# Patient Record
Sex: Female | Born: 1940 | Race: Black or African American | Hispanic: No | Smoking: Never smoker
Health system: Southern US, Community
[De-identification: ages and names within clinical notes are randomized; demographics above are authoritative.]

## PROBLEM LIST (undated history)

## (undated) DIAGNOSIS — K219 Gastro-esophageal reflux disease without esophagitis: Secondary | ICD-10-CM

## (undated) DIAGNOSIS — G20 Parkinson's disease: Secondary | ICD-10-CM

## (undated) DIAGNOSIS — N189 Chronic kidney disease, unspecified: Secondary | ICD-10-CM

## (undated) DIAGNOSIS — M419 Scoliosis, unspecified: Secondary | ICD-10-CM

## (undated) DIAGNOSIS — F0390 Unspecified dementia without behavioral disturbance: Secondary | ICD-10-CM

## (undated) DIAGNOSIS — G20A1 Parkinson's disease without dyskinesia, without mention of fluctuations: Secondary | ICD-10-CM

## (undated) DIAGNOSIS — N390 Urinary tract infection, site not specified: Secondary | ICD-10-CM

## (undated) DIAGNOSIS — I5030 Unspecified diastolic (congestive) heart failure: Secondary | ICD-10-CM

## (undated) DIAGNOSIS — I10 Essential (primary) hypertension: Secondary | ICD-10-CM

## (undated) HISTORY — DX: Chronic kidney disease, unspecified: N18.9

## (undated) HISTORY — DX: Unspecified diastolic (congestive) heart failure: I50.30

## (undated) HISTORY — PX: OTHER SURGICAL HISTORY: SHX169

## (undated) HISTORY — PX: EYE SURGERY: SHX253

---

## 2000-07-07 ENCOUNTER — Ambulatory Visit (HOSPITAL_COMMUNITY): Admission: RE | Admit: 2000-07-07 | Discharge: 2000-07-07 | Payer: Self-pay | Admitting: Family Medicine

## 2000-07-07 ENCOUNTER — Encounter: Payer: Self-pay | Admitting: Family Medicine

## 2000-07-13 ENCOUNTER — Encounter: Payer: Self-pay | Admitting: Family Medicine

## 2000-07-13 ENCOUNTER — Ambulatory Visit (HOSPITAL_COMMUNITY): Admission: RE | Admit: 2000-07-13 | Discharge: 2000-07-13 | Payer: Self-pay | Admitting: Family Medicine

## 2000-09-21 ENCOUNTER — Other Ambulatory Visit: Admission: RE | Admit: 2000-09-21 | Discharge: 2000-09-21 | Payer: Self-pay | Admitting: Obstetrics and Gynecology

## 2000-10-04 ENCOUNTER — Ambulatory Visit (HOSPITAL_COMMUNITY): Admission: RE | Admit: 2000-10-04 | Discharge: 2000-10-04 | Payer: Self-pay | Admitting: Obstetrics and Gynecology

## 2001-01-10 ENCOUNTER — Other Ambulatory Visit: Admission: RE | Admit: 2001-01-10 | Discharge: 2001-01-10 | Payer: Self-pay | Admitting: Family Medicine

## 2001-07-05 ENCOUNTER — Emergency Department (HOSPITAL_COMMUNITY): Admission: EM | Admit: 2001-07-05 | Discharge: 2001-07-05 | Payer: Self-pay | Admitting: Emergency Medicine

## 2001-07-22 ENCOUNTER — Ambulatory Visit (HOSPITAL_COMMUNITY): Admission: RE | Admit: 2001-07-22 | Discharge: 2001-07-22 | Payer: Self-pay | Admitting: Family Medicine

## 2001-07-22 ENCOUNTER — Encounter: Payer: Self-pay | Admitting: Family Medicine

## 2002-07-28 ENCOUNTER — Encounter (INDEPENDENT_AMBULATORY_CARE_PROVIDER_SITE_OTHER): Payer: Self-pay | Admitting: *Deleted

## 2002-07-28 ENCOUNTER — Ambulatory Visit (HOSPITAL_BASED_OUTPATIENT_CLINIC_OR_DEPARTMENT_OTHER): Admission: RE | Admit: 2002-07-28 | Discharge: 2002-07-28 | Payer: Self-pay | Admitting: Ophthalmology

## 2002-08-11 ENCOUNTER — Encounter: Payer: Self-pay | Admitting: Family Medicine

## 2002-08-11 ENCOUNTER — Ambulatory Visit (HOSPITAL_COMMUNITY): Admission: RE | Admit: 2002-08-11 | Discharge: 2002-08-11 | Payer: Self-pay | Admitting: Family Medicine

## 2003-07-30 ENCOUNTER — Ambulatory Visit (HOSPITAL_COMMUNITY): Admission: RE | Admit: 2003-07-30 | Discharge: 2003-07-30 | Payer: Self-pay | Admitting: Family Medicine

## 2003-12-21 ENCOUNTER — Emergency Department (HOSPITAL_COMMUNITY): Admission: EM | Admit: 2003-12-21 | Discharge: 2003-12-21 | Payer: Self-pay | Admitting: Emergency Medicine

## 2003-12-21 ENCOUNTER — Emergency Department (HOSPITAL_COMMUNITY): Admission: EM | Admit: 2003-12-21 | Discharge: 2003-12-21 | Payer: Self-pay | Admitting: *Deleted

## 2004-07-03 ENCOUNTER — Encounter (HOSPITAL_COMMUNITY): Admission: RE | Admit: 2004-07-03 | Discharge: 2004-07-04 | Payer: Self-pay | Admitting: Internal Medicine

## 2004-08-05 ENCOUNTER — Ambulatory Visit (HOSPITAL_COMMUNITY): Admission: RE | Admit: 2004-08-05 | Discharge: 2004-08-05 | Payer: Self-pay | Admitting: Family Medicine

## 2004-10-08 ENCOUNTER — Emergency Department (HOSPITAL_COMMUNITY): Admission: EM | Admit: 2004-10-08 | Discharge: 2004-10-08 | Payer: Self-pay | Admitting: Emergency Medicine

## 2005-01-06 ENCOUNTER — Emergency Department (HOSPITAL_COMMUNITY): Admission: EM | Admit: 2005-01-06 | Discharge: 2005-01-06 | Payer: Self-pay | Admitting: Emergency Medicine

## 2005-06-11 ENCOUNTER — Encounter (INDEPENDENT_AMBULATORY_CARE_PROVIDER_SITE_OTHER): Payer: Self-pay | Admitting: *Deleted

## 2005-06-11 ENCOUNTER — Ambulatory Visit (HOSPITAL_COMMUNITY): Admission: RE | Admit: 2005-06-11 | Discharge: 2005-06-11 | Payer: Self-pay | Admitting: General Surgery

## 2005-07-27 ENCOUNTER — Encounter (INDEPENDENT_AMBULATORY_CARE_PROVIDER_SITE_OTHER): Payer: Self-pay | Admitting: Specialist

## 2005-07-27 ENCOUNTER — Ambulatory Visit (HOSPITAL_BASED_OUTPATIENT_CLINIC_OR_DEPARTMENT_OTHER): Admission: RE | Admit: 2005-07-27 | Discharge: 2005-07-27 | Payer: Self-pay | Admitting: Ophthalmology

## 2005-08-14 ENCOUNTER — Ambulatory Visit (HOSPITAL_COMMUNITY): Admission: RE | Admit: 2005-08-14 | Discharge: 2005-08-14 | Payer: Self-pay | Admitting: Family Medicine

## 2005-11-13 ENCOUNTER — Emergency Department (HOSPITAL_COMMUNITY): Admission: EM | Admit: 2005-11-13 | Discharge: 2005-11-14 | Payer: Self-pay | Admitting: Emergency Medicine

## 2006-08-17 ENCOUNTER — Ambulatory Visit (HOSPITAL_COMMUNITY): Admission: RE | Admit: 2006-08-17 | Discharge: 2006-08-17 | Payer: Self-pay | Admitting: Family Medicine

## 2007-02-15 ENCOUNTER — Ambulatory Visit (HOSPITAL_COMMUNITY): Admission: RE | Admit: 2007-02-15 | Discharge: 2007-02-15 | Payer: Self-pay | Admitting: Family Medicine

## 2007-03-22 ENCOUNTER — Ambulatory Visit (HOSPITAL_COMMUNITY): Admission: RE | Admit: 2007-03-22 | Discharge: 2007-03-22 | Payer: Self-pay | Admitting: Family Medicine

## 2007-04-08 ENCOUNTER — Ambulatory Visit (HOSPITAL_COMMUNITY): Admission: RE | Admit: 2007-04-08 | Discharge: 2007-04-08 | Payer: Self-pay | Admitting: Family Medicine

## 2007-04-08 ENCOUNTER — Encounter: Payer: Self-pay | Admitting: Orthopedic Surgery

## 2007-04-27 ENCOUNTER — Ambulatory Visit: Payer: Self-pay | Admitting: Orthopedic Surgery

## 2007-04-27 DIAGNOSIS — M7150 Other bursitis, not elsewhere classified, unspecified site: Secondary | ICD-10-CM

## 2007-04-27 DIAGNOSIS — M19049 Primary osteoarthritis, unspecified hand: Secondary | ICD-10-CM | POA: Insufficient documentation

## 2007-06-29 ENCOUNTER — Ambulatory Visit: Payer: Self-pay | Admitting: Orthopedic Surgery

## 2007-08-23 ENCOUNTER — Ambulatory Visit (HOSPITAL_COMMUNITY): Admission: RE | Admit: 2007-08-23 | Discharge: 2007-08-23 | Payer: Self-pay | Admitting: Family Medicine

## 2008-05-01 ENCOUNTER — Ambulatory Visit (HOSPITAL_COMMUNITY): Admission: RE | Admit: 2008-05-01 | Discharge: 2008-05-01 | Payer: Self-pay | Admitting: Family Medicine

## 2008-05-02 ENCOUNTER — Ambulatory Visit: Payer: Self-pay | Admitting: Orthopedic Surgery

## 2008-05-14 ENCOUNTER — Ambulatory Visit (HOSPITAL_COMMUNITY): Admission: RE | Admit: 2008-05-14 | Discharge: 2008-05-14 | Payer: Self-pay | Admitting: Family Medicine

## 2008-05-15 ENCOUNTER — Encounter: Payer: Self-pay | Admitting: Orthopedic Surgery

## 2008-08-24 ENCOUNTER — Ambulatory Visit (HOSPITAL_COMMUNITY): Admission: RE | Admit: 2008-08-24 | Discharge: 2008-08-24 | Payer: Self-pay | Admitting: Family Medicine

## 2008-09-06 ENCOUNTER — Ambulatory Visit (HOSPITAL_COMMUNITY): Admission: RE | Admit: 2008-09-06 | Discharge: 2008-09-06 | Payer: Self-pay | Admitting: Family Medicine

## 2009-09-10 ENCOUNTER — Ambulatory Visit (HOSPITAL_COMMUNITY): Admission: RE | Admit: 2009-09-10 | Discharge: 2009-09-10 | Payer: Self-pay | Admitting: Family Medicine

## 2009-09-18 ENCOUNTER — Ambulatory Visit (HOSPITAL_COMMUNITY): Admission: RE | Admit: 2009-09-18 | Discharge: 2009-09-18 | Payer: Self-pay | Admitting: Family Medicine

## 2009-09-20 ENCOUNTER — Encounter: Admission: RE | Admit: 2009-09-20 | Discharge: 2009-09-20 | Payer: Self-pay | Admitting: Family Medicine

## 2010-01-26 ENCOUNTER — Encounter: Payer: Self-pay | Admitting: Family Medicine

## 2010-05-23 NOTE — Op Note (Signed)
   [name], [name]                            ACCOUNT NO.:  [id number]   MEDICAL RECORD NO.:  [id number]                   PATIENT TYPE:  OUT   LOCATION:  RAD                                  FACILITY:  [facility]   PHYSICIAN:  Garlan Fair., M.D.         DATE OF BIRTH:  12/13/1942   DATE OF PROCEDURE:  07/29/2002  DATE OF DISCHARGE:                                 OPERATIVE REPORT   PREOPERATIVE DIAGNOSIS:  Cyst, lateral canthus, right eye.   POSTOPERATIVE DIAGNOSIS:  Cyst, lateral canthus, right eye.   OPERATION:  Cyst excision.   ANESTHESIA:  Xylocaine 1%.   JUSTIFICATION FOR PROCEDURE:  This is a 70 year old lady who has had a cyst  located along the lateral canthus of the right eye for some time. The cyst  over the past several months has begun to grow. It has become extremely  pruritic and irritated. She requests that the cyst be excised because it was  causing her so many problems. She is admitted at this time for that purpose.   DESCRIPTION OF PROCEDURE:  The patient was prepped and draped in the usual  manner. Then the skin  around the lateral  canthus immediately  adjacent to  the cyst was infiltrated with several mL of Xylocaine. The cyst was measured  and found to be approximately 1 x 0.5 cm in dimension.   A curvilinear incision was made around the borders of the lesion. The skin  was then undermined using a blunt and sharp dissection. The cyst was then  excised in toto using blunt and sharp dissection. The skin was then  reapproximated by using interrupted sutures of 6-0 silk. Polysporin  ophthalmic ointment and a pressure patch was applied.   The patient tolerated the procedure well. She was discharged to the post  anesthesia care unit in satisfactory condition. She was instructed to take  Vicodin q.4h. p.r.n. pain, to remove the patch this afternoon, to continue  applying Polysporin ointment to the suture  site twice a day and to see me  in the office in  one week for further evaluation.   DISCHARGE DIAGNOSIS:  Cyst, lateral canthus, right eye.                                               Garlan Fair., M.D.    TB/MEDQ  D:  07/29/2002  T:  07/30/2002  Job:  [id number]

## 2010-05-23 NOTE — Op Note (Signed)
[name], [name]                  ACCOUNT NO.:  [id number]   MEDICAL RECORD NO.:  [id number]          PATIENT TYPE:  EMS   LOCATION:  ED                            FACILITY:  [facility]   PHYSICIAN:  Garlan Fair., M.D.DATE OF BIRTH:  10/12/1940   DATE OF PROCEDURE:  DATE OF DISCHARGE:  11/14/2005                               OPERATIVE REPORT   PREOPERATIVE DIAGNOSIS:  Papilloma, upper lid, left eye.   POSTOPERATIVE DIAGNOSIS:  Papilloma, upper lid, left eye.   OPERATION:  Excision of papilloma, upper lid, left eye.   ANESTHESIA:  Local using Xylocaine 1%.   PROCEDURE:  The lid was evaluated, and a papilloma was located along the  lid margin near the lateral canthus of the left eye.  The lid was  prepped and draped in usual manner.  Then, the area was infiltrated with  7 mL Xylocaine.  A curvilinear incision made around the lesion, and the  skin was carefully undermined using sharp and blunt dissection.  The  lesion was excised in toto.  The lid margins were undermined and then  reapproximated using 6-0 silk.  The patient tolerated the procedure well  and was discharged to the post-anesthesia recovery room in satisfactory  condition.  She was instructed to take Vicodin every 4 hours as needed  for pain and to see me in office tomorrow for further evaluation.   DISCHARGE DIAGNOSIS:  Papilla, left eye.      Garlan Fair., M.D.  Electronically Signed     TB/MEDQ  D:  04/12/2006  T:  04/13/2006  Job:  [id number]

## 2010-05-23 NOTE — H&P (Signed)
[name], [name]                  ACCOUNT NO.:  [id number]   MEDICAL RECORD NO.:  [id number]          PATIENT TYPE:  AMB   LOCATION:  DAY                           FACILITY:  [facility]   PHYSICIAN:  Leane Para C. Tamala Julian, M.D.   DATE OF BIRTH:  1940-06-13   DATE OF ADMISSION:  DATE OF DISCHARGE:  LH                                HISTORY & PHYSICAL   HISTORY OF PRESENT ILLNESS:  A 70 year old female referred for colonoscopy.  She has a family history of colon cancer. She denies difficulty with bowel  movements. She denies rectal bleeding. There is no history of black stools.  No documented evidence of anemia.   PAST HISTORY:  She has hypertension and diabetes mellitus.   MEDICATIONS:  1.  Atenolol 50 mg b.i.d.  2.  Metformin 1000 mg b.i.d.  3.  Diovan 160 mg daily.  4.  Diltiazem 360 mg daily.  5.  Humulin N 37 units in the morning and 5 units in the evening.  6.  Humulin R 15 units in the morning and 5 units in the evening.   PHYSICAL EXAMINATION:  VITAL SIGNS:  Blood pressure 180/90, pulse 66,  respirations 20, weight 172 pounds.  HEENT:  Unremarkable.  NECK:  Supple. No JVD, bruit, adenopathy or thyromegaly.  CHEST:  Clear to auscultation.  HEART:  Regular rhythm without murmur, gallop or rub.  ABDOMEN:  Soft, nontender, no masses.  EXTREMITIES:  No clubbing, cyanosis, or edema.  NEUROLOGICAL:  No focal motor, sensory or cerebellar deficits.   IMPRESSION:  1.  Need for screening colonoscopy.  2.  Family history of colon cancer.  3.  Diabetes mellitus.  4.  Hypertension.  5.  Diabetic retinopathy.   PLAN:  Screening colonoscopy.      Vernon Prey. Tamala Julian, M.D.  Electronically Signed     LCS/MEDQ  D:  06/10/2005  T:  06/10/2005  Job:  [id number]

## 2010-08-07 ENCOUNTER — Other Ambulatory Visit (HOSPITAL_COMMUNITY): Payer: Self-pay | Admitting: Urology

## 2010-08-07 DIAGNOSIS — N390 Urinary tract infection, site not specified: Secondary | ICD-10-CM

## 2010-08-12 ENCOUNTER — Other Ambulatory Visit (HOSPITAL_COMMUNITY): Payer: Self-pay | Admitting: Family Medicine

## 2010-08-12 ENCOUNTER — Ambulatory Visit (HOSPITAL_COMMUNITY)
Admission: RE | Admit: 2010-08-12 | Discharge: 2010-08-12 | Disposition: A | Discharge status: Home / Self Care | Payer: PRIVATE HEALTH INSURANCE | Source: Ambulatory Visit | Attending: Urology | Admitting: Urology

## 2010-08-12 DIAGNOSIS — Q619 Cystic kidney disease, unspecified: Secondary | ICD-10-CM | POA: Insufficient documentation

## 2010-08-12 DIAGNOSIS — Z139 Encounter for screening, unspecified: Secondary | ICD-10-CM

## 2010-08-12 DIAGNOSIS — N390 Urinary tract infection, site not specified: Secondary | ICD-10-CM | POA: Insufficient documentation

## 2010-08-21 ENCOUNTER — Other Ambulatory Visit (HOSPITAL_COMMUNITY): Payer: Self-pay | Admitting: Urology

## 2010-08-21 DIAGNOSIS — N2889 Other specified disorders of kidney and ureter: Secondary | ICD-10-CM

## 2010-08-26 ENCOUNTER — Ambulatory Visit (HOSPITAL_COMMUNITY)
Admission: RE | Admit: 2010-08-26 | Discharge: 2010-08-26 | Disposition: A | Discharge status: Home / Self Care | Payer: PRIVATE HEALTH INSURANCE | Source: Ambulatory Visit | Attending: Urology | Admitting: Urology

## 2010-08-26 ENCOUNTER — Other Ambulatory Visit (HOSPITAL_COMMUNITY): Payer: Self-pay | Admitting: Urology

## 2010-08-26 DIAGNOSIS — N390 Urinary tract infection, site not specified: Secondary | ICD-10-CM

## 2010-08-26 DIAGNOSIS — Q619 Cystic kidney disease, unspecified: Secondary | ICD-10-CM | POA: Insufficient documentation

## 2010-08-26 DIAGNOSIS — N2889 Other specified disorders of kidney and ureter: Secondary | ICD-10-CM

## 2010-08-26 DIAGNOSIS — N289 Disorder of kidney and ureter, unspecified: Secondary | ICD-10-CM | POA: Insufficient documentation

## 2010-08-26 DIAGNOSIS — D3500 Benign neoplasm of unspecified adrenal gland: Secondary | ICD-10-CM | POA: Insufficient documentation

## 2010-08-26 NOTE — Progress Notes (Signed)
[name] is a 70 y.o. female patient. 1. Right renal mass    No past medical history on file. No current outpatient prescriptions on file.   Allergies not on file Active Problems:  * No active hospital problems. *   There were no vitals taken for this visit.  Subjective Objective Assessment & Plan  [name] A 08/26/2010   1145 hrs: The patient underwent attempted CT abdomen with and without contrast for workup of a right renal mass.  At time of power injection of contrast material through an IV placed in the right upper extremity, the entirety of the 80 ml Omnipaque-300 contrast bolus extravasated into the right upper arm, along with 20 ml of saline flush.  Procedure was terminated. The patient's right upper arm is swollen but soft.  Arm is nontender to palpitations.  Capillary refill of the fingers is normal.  Strong radial and weak ulnar pulses are felt at the right wrist. The patient demonstrates normal range of motion and grossly normal sensation. Routine post extravasation information/instructions was provided to the patient.  The patient will be reexamined by the technologist when she returns for a different imaging appointment later in the day.

## 2010-09-25 ENCOUNTER — Ambulatory Visit (HOSPITAL_COMMUNITY)
Admission: RE | Admit: 2010-09-25 | Discharge: 2010-09-25 | Disposition: A | Discharge status: Home / Self Care | Payer: PRIVATE HEALTH INSURANCE | Source: Ambulatory Visit | Attending: Family Medicine | Admitting: Family Medicine

## 2010-09-25 DIAGNOSIS — Z139 Encounter for screening, unspecified: Secondary | ICD-10-CM

## 2010-09-25 DIAGNOSIS — Z1231 Encounter for screening mammogram for malignant neoplasm of breast: Secondary | ICD-10-CM | POA: Insufficient documentation

## 2011-09-04 ENCOUNTER — Encounter (HOSPITAL_COMMUNITY): Payer: Self-pay | Admitting: *Deleted

## 2011-09-04 ENCOUNTER — Emergency Department (HOSPITAL_COMMUNITY)
Admission: EM | Admit: 2011-09-04 | Discharge: 2011-09-04 | Disposition: A | Discharge status: Home / Self Care | Payer: PRIVATE HEALTH INSURANCE | Attending: Emergency Medicine | Admitting: Emergency Medicine

## 2011-09-04 DIAGNOSIS — Z794 Long term (current) use of insulin: Secondary | ICD-10-CM | POA: Insufficient documentation

## 2011-09-04 DIAGNOSIS — E119 Type 2 diabetes mellitus without complications: Secondary | ICD-10-CM | POA: Insufficient documentation

## 2011-09-04 DIAGNOSIS — R739 Hyperglycemia, unspecified: Secondary | ICD-10-CM

## 2011-09-04 DIAGNOSIS — I10 Essential (primary) hypertension: Secondary | ICD-10-CM | POA: Insufficient documentation

## 2011-09-04 HISTORY — DX: Essential (primary) hypertension: I10

## 2011-09-04 HISTORY — DX: Urinary tract infection, site not specified: N39.0

## 2011-09-04 LAB — GLUCOSE, CAPILLARY
Glucose-Capillary: 245 mg/dL — ABNORMAL HIGH (ref 70–99)
Glucose-Capillary: 161 mg/dL — ABNORMAL HIGH (ref 70–99)

## 2011-09-04 LAB — BASIC METABOLIC PANEL
BUN: 13 mg/dL (ref 6–23)
CO2: 28 mEq/L (ref 19–32)
Chloride: 95 mEq/L — ABNORMAL LOW (ref 96–112)
Creatinine, Ser: 1.31 mg/dL — ABNORMAL HIGH (ref 0.50–1.10)

## 2011-09-04 MED ORDER — INSULIN ASPART 100 UNIT/ML ~~LOC~~ SOLN
10.0000 [IU] | Freq: Once | SUBCUTANEOUS | Status: AC
  Administered 2011-09-04: 10 [IU] via SUBCUTANEOUS
  Filled 2011-09-04: qty 1

## 2011-09-04 MED ORDER — INSULIN REGULAR HUMAN 100 UNIT/ML IJ SOLN: 10.0000 [IU] | Freq: Once | INTRAMUSCULAR | Status: DC

## 2011-09-04 NOTE — ED Provider Notes (Signed)
History  This chart was scribed for Colleen Diego, MD by Lovena Le Day. This patient was seen in room [facility] and the patient's care was started at 1533.   CSN: [id number]  Arrival date & time 09/04/11  1533   First MD Initiated Contact with Patient 09/04/11 1551      Chief Complaint  Patient presents with  . Hyperglycemia   Patient is a 71 y.o. female presenting with diabetes problem. The history is provided by the patient. No language interpreter was used.  Diabetes She presents for her initial diabetic visit. She has type 2 diabetes mellitus. Her disease course has been stable. Pertinent negatives for hypoglycemia include no confusion, headaches or seizures. Pertinent negatives for diabetes include no chest pain, no fatigue and no foot ulcerations. There are no hypoglycemic complications. Symptoms are stable. Symptoms have been present for 1 day. Pertinent negatives for diabetic complications include no CVA. Current diabetic treatment includes insulin injections.   SHARREN SILVERIO is a 71 y.o. female who presents to the Emergency Department complaining of constant high blood blood sugar levels after she was seen by her PCP today. She states at PCP's office it was 300. She usually has trouble with low BS but states this episode of high BS is unusual. She regularly checks her BS levels twice per day and takes 38 units Novolog in AM and 20 units Novolog in PM. Other than high BS, she states she feels fine and normal.   Past Medical History  Diagnosis Date  . Diabetes mellitus   . Hypertension   . UTI (lower urinary tract infection)     Past Surgical History  Procedure Date  . Eye surgery   . 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 No pertinent family history.  History  Substance Use Topics  . Smoking status: Never Smoker   . Smokeless tobacco: Not on file  . Alcohol Use: No    OB History    Grav Para Term Preterm Abortions TAB SAB Ect Mult Living                   Review of Systems  Constitutional: Negative for fatigue.  HENT: Negative for congestion, sinus pressure and ear discharge.   Eyes: Negative for discharge.  Respiratory: Negative for cough.   Cardiovascular: Negative for chest pain.  Gastrointestinal: Negative for abdominal pain and diarrhea.  Genitourinary: Negative for frequency and hematuria.  Musculoskeletal: Negative for back pain.  Skin: Negative for color change and rash.  Neurological: Negative for seizures and headaches.  Hematological: Negative.   Psychiatric/Behavioral: Negative for hallucinations and confusion.  All other systems reviewed and are negative.    Allergies  Review of patient's allergies indicates no known allergies.  Home Medications  No current outpatient prescriptions on file.  Triage Vitals: BP 146/78  Pulse 99  Temp 98.5 F (36.9 C) (Oral)  Resp 18  Ht 5\' 6"  (1.676 m)  Wt 156 lb (70.761 kg)  BMI 25.18 kg/m2  SpO2 99%  Physical Exam  Nursing note and vitals reviewed. Constitutional: She is oriented to person, place, and time. She appears well-developed. No distress.  HENT:  Head: Normocephalic and atraumatic.  Eyes: Conjunctivae and EOM are normal. No scleral icterus.  Neck: Neck supple. No thyromegaly present.  Cardiovascular: Normal rate, regular rhythm and normal heart sounds.  Exam reveals no gallop and no friction rub.   No murmur heard. Pulmonary/Chest: Effort normal. No stridor. She has no wheezes. She  has no rales. She exhibits no tenderness.  Abdominal: She exhibits no distension. There is no tenderness. There is no rebound.  Musculoskeletal: Normal range of motion. She exhibits no edema.  Lymphadenopathy:    She has no cervical adenopathy.  Neurological: She is oriented to person, place, and time. Coordination normal.  Skin: No rash noted. No erythema.  Psychiatric: She has a normal mood and affect. Her behavior is normal.    ED Course  Procedures (including critical care  time) DIAGNOSTIC STUDIES: Oxygen Saturation is 99% on room air, normal by my interpretation.    COORDINATION OF CARE: At 400 PM Discussed treatment plan with patient which includes insulin. Patient agrees.    Labs Reviewed  GLUCOSE, CAPILLARY   No results found.   No diagnosis found.    MDM     The chart was scribed for me under my direct supervision.  I personally performed the history, physical, and medical decision making and all procedures in the evaluation of this patient.Colleen Diego, MD 09/04/11 [phone]

## 2011-09-04 NOTE — ED Notes (Signed)
Elevated glucose, Seen by MD today and BS over 300.

## 2011-10-13 ENCOUNTER — Other Ambulatory Visit (HOSPITAL_COMMUNITY): Payer: Self-pay | Admitting: Family Medicine

## 2011-10-13 DIAGNOSIS — Z139 Encounter for screening, unspecified: Secondary | ICD-10-CM

## 2011-10-16 ENCOUNTER — Ambulatory Visit (HOSPITAL_COMMUNITY)
Admission: RE | Admit: 2011-10-16 | Discharge: 2011-10-16 | Disposition: A | Discharge status: Home / Self Care | Payer: PRIVATE HEALTH INSURANCE | Source: Ambulatory Visit | Attending: Family Medicine | Admitting: Family Medicine

## 2011-10-16 DIAGNOSIS — Z1231 Encounter for screening mammogram for malignant neoplasm of breast: Secondary | ICD-10-CM | POA: Insufficient documentation

## 2011-10-16 DIAGNOSIS — Z139 Encounter for screening, unspecified: Secondary | ICD-10-CM

## 2011-10-21 ENCOUNTER — Other Ambulatory Visit: Payer: Self-pay | Admitting: Family Medicine

## 2011-10-21 DIAGNOSIS — R928 Other abnormal and inconclusive findings on diagnostic imaging of breast: Secondary | ICD-10-CM

## 2011-11-04 ENCOUNTER — Ambulatory Visit (HOSPITAL_COMMUNITY)
Admission: RE | Admit: 2011-11-04 | Discharge: 2011-11-04 | Disposition: A | Discharge status: Home / Self Care | Payer: PRIVATE HEALTH INSURANCE | Source: Ambulatory Visit | Attending: Family Medicine | Admitting: Family Medicine

## 2011-11-04 ENCOUNTER — Other Ambulatory Visit: Payer: Self-pay | Admitting: Family Medicine

## 2011-11-04 ENCOUNTER — Other Ambulatory Visit (HOSPITAL_COMMUNITY): Payer: Self-pay | Admitting: Family Medicine

## 2011-11-04 DIAGNOSIS — R928 Other abnormal and inconclusive findings on diagnostic imaging of breast: Secondary | ICD-10-CM

## 2011-11-04 DIAGNOSIS — N6009 Solitary cyst of unspecified breast: Secondary | ICD-10-CM | POA: Insufficient documentation

## 2012-10-24 ENCOUNTER — Other Ambulatory Visit (HOSPITAL_COMMUNITY): Payer: Self-pay | Admitting: Family Medicine

## 2012-10-24 DIAGNOSIS — Z139 Encounter for screening, unspecified: Secondary | ICD-10-CM

## 2012-11-08 ENCOUNTER — Ambulatory Visit (HOSPITAL_COMMUNITY)
Admission: RE | Admit: 2012-11-08 | Discharge: 2012-11-08 | Disposition: A | Discharge status: Home / Self Care | Payer: PRIVATE HEALTH INSURANCE | Source: Ambulatory Visit | Attending: Family Medicine | Admitting: Family Medicine

## 2012-11-08 DIAGNOSIS — Z139 Encounter for screening, unspecified: Secondary | ICD-10-CM

## 2012-11-08 DIAGNOSIS — Z1231 Encounter for screening mammogram for malignant neoplasm of breast: Secondary | ICD-10-CM | POA: Insufficient documentation

## 2012-12-21 ENCOUNTER — Other Ambulatory Visit (HOSPITAL_COMMUNITY): Payer: Self-pay | Admitting: Urology

## 2012-12-21 DIAGNOSIS — N390 Urinary tract infection, site not specified: Secondary | ICD-10-CM

## 2012-12-23 ENCOUNTER — Ambulatory Visit (HOSPITAL_COMMUNITY)
Admission: RE | Admit: 2012-12-23 | Discharge: 2012-12-23 | Disposition: A | Discharge status: Home / Self Care | Payer: PRIVATE HEALTH INSURANCE | Source: Ambulatory Visit | Attending: Urology | Admitting: Urology

## 2012-12-23 DIAGNOSIS — N390 Urinary tract infection, site not specified: Secondary | ICD-10-CM | POA: Insufficient documentation

## 2012-12-23 DIAGNOSIS — N189 Chronic kidney disease, unspecified: Secondary | ICD-10-CM | POA: Insufficient documentation

## 2012-12-23 DIAGNOSIS — E119 Type 2 diabetes mellitus without complications: Secondary | ICD-10-CM | POA: Insufficient documentation

## 2012-12-23 DIAGNOSIS — N281 Cyst of kidney, acquired: Secondary | ICD-10-CM | POA: Insufficient documentation

## 2012-12-23 DIAGNOSIS — I129 Hypertensive chronic kidney disease with stage 1 through stage 4 chronic kidney disease, or unspecified chronic kidney disease: Secondary | ICD-10-CM | POA: Insufficient documentation

## 2013-10-05 ENCOUNTER — Other Ambulatory Visit (HOSPITAL_COMMUNITY): Payer: Self-pay | Admitting: Family Medicine

## 2013-10-05 DIAGNOSIS — Z1231 Encounter for screening mammogram for malignant neoplasm of breast: Secondary | ICD-10-CM

## 2013-11-10 ENCOUNTER — Ambulatory Visit (HOSPITAL_COMMUNITY)
Admission: RE | Admit: 2013-11-10 | Discharge: 2013-11-10 | Disposition: A | Discharge status: Home / Self Care | Payer: PRIVATE HEALTH INSURANCE | Source: Ambulatory Visit | Attending: Family Medicine | Admitting: Family Medicine

## 2013-11-10 DIAGNOSIS — Z1231 Encounter for screening mammogram for malignant neoplasm of breast: Secondary | ICD-10-CM | POA: Insufficient documentation

## 2014-01-05 HISTORY — PX: OTHER SURGICAL HISTORY: SHX169

## 2014-02-16 DIAGNOSIS — E1342 Other specified diabetes mellitus with diabetic polyneuropathy: Secondary | ICD-10-CM | POA: Diagnosis not present

## 2014-02-16 DIAGNOSIS — L609 Nail disorder, unspecified: Secondary | ICD-10-CM | POA: Diagnosis not present

## 2014-03-12 DIAGNOSIS — E1165 Type 2 diabetes mellitus with hyperglycemia: Secondary | ICD-10-CM | POA: Diagnosis not present

## 2014-03-12 DIAGNOSIS — E11649 Type 2 diabetes mellitus with hypoglycemia without coma: Secondary | ICD-10-CM | POA: Diagnosis not present

## 2014-04-14 DIAGNOSIS — N183 Chronic kidney disease, stage 3 (moderate): Secondary | ICD-10-CM | POA: Diagnosis not present

## 2014-04-14 DIAGNOSIS — E1122 Type 2 diabetes mellitus with diabetic chronic kidney disease: Secondary | ICD-10-CM | POA: Diagnosis not present

## 2014-04-14 DIAGNOSIS — I10 Essential (primary) hypertension: Secondary | ICD-10-CM | POA: Diagnosis not present

## 2014-04-14 DIAGNOSIS — E1165 Type 2 diabetes mellitus with hyperglycemia: Secondary | ICD-10-CM | POA: Diagnosis not present

## 2014-04-19 ENCOUNTER — Encounter (HOSPITAL_COMMUNITY): Payer: Self-pay | Admitting: *Deleted

## 2014-04-19 ENCOUNTER — Emergency Department (HOSPITAL_COMMUNITY): Payer: Medicare Other

## 2014-04-19 ENCOUNTER — Emergency Department (HOSPITAL_COMMUNITY)
Admission: EM | Admit: 2014-04-19 | Discharge: 2014-04-19 | Disposition: A | Discharge status: Home / Self Care | Payer: Medicare Other | Attending: Emergency Medicine | Admitting: Emergency Medicine

## 2014-04-19 DIAGNOSIS — Z7982 Long term (current) use of aspirin: Secondary | ICD-10-CM | POA: Diagnosis not present

## 2014-04-19 DIAGNOSIS — Z794 Long term (current) use of insulin: Secondary | ICD-10-CM | POA: Diagnosis not present

## 2014-04-19 DIAGNOSIS — K1120 Sialoadenitis, unspecified: Secondary | ICD-10-CM | POA: Diagnosis not present

## 2014-04-19 DIAGNOSIS — Z79899 Other long term (current) drug therapy: Secondary | ICD-10-CM | POA: Insufficient documentation

## 2014-04-19 DIAGNOSIS — M419 Scoliosis, unspecified: Secondary | ICD-10-CM | POA: Diagnosis not present

## 2014-04-19 DIAGNOSIS — I10 Essential (primary) hypertension: Secondary | ICD-10-CM | POA: Diagnosis not present

## 2014-04-19 DIAGNOSIS — Z87440 Personal history of urinary (tract) infections: Secondary | ICD-10-CM | POA: Insufficient documentation

## 2014-04-19 DIAGNOSIS — E119 Type 2 diabetes mellitus without complications: Secondary | ICD-10-CM | POA: Diagnosis not present

## 2014-04-19 DIAGNOSIS — H9202 Otalgia, left ear: Secondary | ICD-10-CM | POA: Diagnosis present

## 2014-04-19 DIAGNOSIS — R221 Localized swelling, mass and lump, neck: Secondary | ICD-10-CM | POA: Diagnosis not present

## 2014-04-19 HISTORY — DX: Scoliosis, unspecified: M41.9

## 2014-04-19 LAB — CBC WITH DIFFERENTIAL/PLATELET
BASOS PCT: 0 % (ref 0–1)
Basophils Absolute: 0 10*3/uL (ref 0.0–0.1)
EOS ABS: 0.1 10*3/uL (ref 0.0–0.7)
Eosinophils Relative: 1 % (ref 0–5)
HEMATOCRIT: 38.8 % (ref 36.0–46.0)
Hemoglobin: 12.5 g/dL (ref 12.0–15.0)
Lymphocytes Relative: 24 % (ref 12–46)
Lymphs Abs: 1.9 10*3/uL (ref 0.7–4.0)
MCH: 27.7 pg (ref 26.0–34.0)
MCHC: 32.2 g/dL (ref 30.0–36.0)
MCV: 86 fL (ref 78.0–100.0)
MONOS PCT: 8 % (ref 3–12)
Monocytes Absolute: 0.7 10*3/uL (ref 0.1–1.0)
Neutro Abs: 5.5 10*3/uL (ref 1.7–7.7)
Neutrophils Relative %: 67 % (ref 43–77)
Platelets: 294 10*3/uL (ref 150–400)
RBC: 4.51 MIL/uL (ref 3.87–5.11)
RDW: 13.6 % (ref 11.5–15.5)
WBC: 8.2 10*3/uL (ref 4.0–10.5)

## 2014-04-19 LAB — BASIC METABOLIC PANEL
ANION GAP: 8 (ref 5–15)
BUN: 21 mg/dL (ref 6–23)
CHLORIDE: 99 mmol/L (ref 96–112)
CO2: 30 mmol/L (ref 19–32)
CREATININE: 1.49 mg/dL — AB (ref 0.50–1.10)
Calcium: 9.5 mg/dL (ref 8.4–10.5)
GFR calc Af Amer: 39 mL/min — ABNORMAL LOW (ref >90)
GFR calc non Af Amer: 34 mL/min — ABNORMAL LOW (ref >90)
Glucose, Bld: 133 mg/dL — ABNORMAL HIGH (ref 70–99)
POTASSIUM: 3.2 mmol/L — AB (ref 3.5–5.1)
SODIUM: 137 mmol/L (ref 135–145)

## 2014-04-19 LAB — RAPID STREP SCREEN (MED CTR MEBANE ONLY): Streptococcus, Group A Screen (Direct): NEGATIVE

## 2014-04-19 MED ORDER — HYDROCODONE-ACETAMINOPHEN 5-325 MG PO TABS: 1.0000 | ORAL_TABLET | Freq: Four times a day (QID) | ORAL | Status: DC | PRN

## 2014-04-19 MED ORDER — FENTANYL CITRATE (PF) 100 MCG/2ML IJ SOLN
50.0000 ug | Freq: Once | INTRAMUSCULAR | Status: AC
  Administered 2014-04-19: 50 ug via INTRAVENOUS
  Filled 2014-04-19: qty 2

## 2014-04-19 MED ORDER — SODIUM CHLORIDE 0.9 % IV BOLUS (SEPSIS)
500.0000 mL | Freq: Once | INTRAVENOUS | Status: AC
  Administered 2014-04-19: 500 mL via INTRAVENOUS

## 2014-04-19 MED ORDER — CEPHALEXIN 500 MG PO CAPS: 500.0000 mg | ORAL_CAPSULE | Freq: Four times a day (QID) | ORAL | Status: DC

## 2014-04-19 MED ORDER — CEPHALEXIN 500 MG PO CAPS
500.0000 mg | ORAL_CAPSULE | Freq: Once | ORAL | Status: AC
  Administered 2014-04-19: 500 mg via ORAL
  Filled 2014-04-19: qty 1

## 2014-04-19 MED ORDER — IOHEXOL 300 MG/ML  SOLN
80.0000 mL | Freq: Once | INTRAMUSCULAR | Status: AC | PRN
  Administered 2014-04-19: 80 mL via INTRAVENOUS

## 2014-04-19 NOTE — Discharge Instructions (Signed)
Sialadenitis Sialadenitis is an inflammation (soreness) of the salivary glands. The parotid is the main salivary gland. It lies behind the angle of the jaw below the ear. The saliva produced comes out of a tiny opening (duct) inside the cheek on either side. This is usually at the level of the upper back teeth. If it is swollen, the ear is pushed up and out. This helps tell this condition apart from a simple lymph gland infection (swollen glands) in the same area. Mumps has mostly disappeared since the start of immunization against mumps. Now the most common cause of parotitis is germ (bacterial) infection or inflammation of the lymphatics (the lymph channels). The other major salivary gland is located in the floor of the mouth. Smaller salivary glands are located in the mouth. This includes the:  Lips.  Lining of the mouth.  Pharynx.  Hard palate (front part of the roof of the mouth). The salivary glands do many things, including:  Lubrication.  Breaking down food.  Production of hormones and antibodies (to protect against germs which may cause illness).  Help with the sense of taste. ACUTE BACTERIAL SIALADENITIS This is a sudden inflammatory response to bacterial infection. This causes redness, pain, swelling and tenderness over the infected gland. In the past, it was common in dehydrated and debilitated patients often following an operation. It is now more commonly seen:  After radiotherapy.  In patients with poor immune systems. Treatment is:  The correction of fluid balance (rehydration).  Medicine that kill germs (antibiotics).  Pain relief. CHRONIC RECURRENT SIALADENITIS This refers to repeated episodes of discomfort and swelling of one of the salivary glands. It often occurs after eating. Chronic sialadenitis is usually less painful. It is associated with recurrent enlargement of a salivary gland, often following meals, and typically with an absence of redness. The chronic  form of the disease often is associated with conditions linked to decreased salivary flow, rather than dehydration (loss of body fluids). These conditions include:  A stone, or concretion, formed in the gallbladder, kidneys, or other parts of the body (calculi).  Salivary stasis.  A change in the fluid and electrolyte (the salts in your body fluids) makeup of the gland. It is treated with:  Gland massage.  Methods to stimulate the flow of saliva, (for example, lemon juice).  Antibiotics if required. Surgery to remove the gland is possible, but its benefits need to be balanced against risks.  VIRAL SIALADENITIS Several viruses infect the salivary glands. Some of these include the mumps virus that commonly infects the parotid gland. Other viruses causing problems are:  The HIV virus.  Herpes.  Some of the influenza ("flu") viruses. RECURRENT SIALADENITIS IN CHILDREN This condition is thought to be due to swelling or ballooning of the ducts. It results in the same symptoms as acute bacterial parotitis. It is usually caused by germs (bacteria). It is often treated using penicillin. It may get well without treatment. Surgery is usually not required. TUBERCULOUS SIALADENITIS The salivary glands may become infected with the same bacteria causing tuberculosis ("TB"). Treatment is with anti-tuberculous antibiotic therapy. OTHER UNCOMMON CAUSES OF SIALADENITIS   Sjogren's syndrome is a condition in which arthritis is associated with a decrease in activity of the glands of the body that produce saliva and tears. The diagnosis is made with blood tests or by examination of a piece of tissue from the inside of the lip. Some people with this condition are bothered by:  A dry mouth.  Intermittent salivary gland  enlargement.  Atypical mycobacteria is a germ similar to tuberculosis. It often infects children. It is often resistant to antibiotic treatment. It may require surgical treatment to remove  the infected salivary gland.  Actinomycosis is an infection of the parotid gland that may also involve the overlying skin. The diagnosis is made by detecting granules of sulphur produced by the bacteria on microscopic examination. Treatment is a prolonged course of penicillin for up to one year.  Nutritional causes include vitamin deficiencies and bulimia.  Diabetes and problems with your thyroid.  Obesity, cirrhosis, and malabsorption are some metabolic causes. HOME CARE INSTRUCTIONS   Apply ice bags every 2 hours for 15-20 minutes, while awake, to the sore gland for 24 hours, then as directed by your caregiver. Place the ice in a plastic bag with a towel around it to prevent frostbite to the skin.  Only take over-the-counter or prescription medicines for pain, discomfort, or fever as directed by your caregiver. SEEK IMMEDIATE MEDICAL CARE IF:   There is increased pain or swelling in your gland that is not controlled with medicine.  An oral temperature above 102 F (38.9 C) develops, not controlled by medicine.  You develop difficulty opening your mouth, swallowing, or speaking. Document Released: 06/13/2001 Document Revised: 03/16/2011 Document Reviewed: 08/08/2007 [facility] Patient Information 2015 [place], [place]. This information is not intended to replace advice given to you by your health care provider. Make sure you discuss any questions you have with your health care provider.

## 2014-04-19 NOTE — ED Notes (Signed)
Pt verbalized understanding of no driving and to use caution within 4 hours of taking pain meds due to meds cause drowsiness 

## 2014-04-19 NOTE — ED Provider Notes (Signed)
TIME SEEN: 5:30 PM  CHIEF COMPLAINT: Left ear pain, sore throat, swelling in the submandibular region for 2-3 days  HPI: Patient is a 74 year old female with history of insulin-dependent diabetes, hypertension who presents emergency department with 3 days of sore throat, nasal drainage and left ear pain. No drainage from the ear hearing loss. Over the past 2 days she's noticed increasing swelling and pain under the left submandibular region. No history of injury to the neck. She denies any fever. Has never had similar symptoms before. She has noticed some pain with movement of her neck that no posterior neck pain and no headache. She also reports that she has had some decreased saliva production and feels like her mouth is dry. She is not sure what her blood glucose has been running recently.  ROS: See HPI Constitutional: no fever  Eyes: no drainage  ENT: no runny nose   Cardiovascular:  no chest pain  Resp: no SOB  GI: no vomiting GU: no dysuria Integumentary: no rash  Allergy: no hives  Musculoskeletal: no leg swelling  Neurological: no slurred speech ROS otherwise negative  PAST MEDICAL HISTORY/PAST SURGICAL HISTORY:  Past Medical History  Diagnosis Date  . Diabetes mellitus   . Hypertension   . UTI (lower urinary tract infection)   . Scoliosis     MEDICATIONS:  Prior to Admission medications   Medication Sig Start Date End Date Taking? Authorizing Provider  acetaminophen-codeine (TYLENOL #3) 300-30 MG per tablet Take 1 tablet by mouth every 12 (twelve) hours as needed. For pain    Historical Provider, MD  aspirin EC 81 MG tablet Take 81 mg by mouth daily.    Historical Provider, MD  cloNIDine (CATAPRES) 0.1 MG tablet Take 0.1 mg by mouth 2 (two) times daily. For blood pressure    Historical Provider, MD  diltiazem (CARDIZEM CD) 360 MG 24 hr capsule Take 360 mg by mouth daily.    Historical Provider, MD  furosemide (LASIX) 20 MG tablet Take 20 mg by mouth every other day.     Historical Provider, MD  hydrALAZINE (APRESOLINE) 10 MG tablet Take 10 mg by mouth 3 (three) times daily.    Historical Provider, MD  insulin aspart protamine-insulin aspart (NOVOLOG MIX 70/30 FLEXPEN) (70-30) 100 UNIT/ML injection Inject 20-38 Units into the skin 2 (two) times daily. *Inject 38 units every day in the morning and 20 units in the evening*    Historical Provider, MD  lisinopril-hydrochlorothiazide (PRINZIDE,ZESTORETIC) 20-12.5 MG per tablet Take 1 tablet by mouth daily.    Historical Provider, MD  olopatadine (PATANOL) 0.1 % ophthalmic solution Place 1 drop into both eyes 2 (two) times daily.    Historical Provider, MD  omeprazole (PRILOSEC) 20 MG capsule Take 20 mg by mouth daily. For acid reflux    Historical Provider, MD  potassium chloride SA (K-DUR,KLOR-CON) 20 MEQ tablet Take 20 mEq by mouth daily.    Historical Provider, MD  risedronate (ACTONEL) 150 MG tablet Take 150 mg by mouth every 30 (thirty) days. with water on empty stomach, nothing by mouth or lie down for next 30 minutes.    Historical Provider, MD    ALLERGIES:  No Known Allergies  SOCIAL HISTORY:  History  Substance Use Topics  . Smoking status: Never Smoker   . Smokeless tobacco: Not on file  . Alcohol Use: No    FAMILY HISTORY: History reviewed. No pertinent family history.  EXAM: BP 141/64 mmHg  Pulse 91  Temp(Src) 98.7 F (37.1 C) (  Oral)  Resp 14  Ht 5\' 6"  (1.676 m)  Wt 160 lb (72.576 kg)  BMI 25.84 kg/m2  SpO2 99% CONSTITUTIONAL: Alert and oriented and responds appropriately to questions. Well-appearing; well-nourished HEAD: Normocephalic EYES: Conjunctivae clear, PERRL ENT: normal nose; no rhinorrhea; moist mucous membranes; patient has bilateral tonsillar hypertrophy and erythema without exudate, no uvular deviation, no trismus or drooling, normal phonation, no stridor, she has a large amount of yellow and sinus drainage is in her posterior oropharynx, TMs are clear bilaterally, no sign of  mastoiditis, no bulging or purulence or erythema of the bilateral TMs, she has a large movable tender mass in the left so many Miller region that is approximately 2 x 4 cm with no surrounding erythema or warmth or induration or fluctuance NECK: Supple, no meningismus, no LAD and no midline spine tenderness or step-off or deformity  CARD: RRR; S1 and S2 appreciated; no murmurs, no clicks, no rubs, no gallops RESP: Normal chest excursion without splinting or tachypnea; breath sounds clear and equal bilaterally; no wheezes, no rhonchi, no rales ABD/GI: Normal bowel sounds; non-distended; soft, non-tender, no rebound, no guarding BACK:  The back appears normal and is non-tender to palpation, there is no CVA tenderness EXT: Normal ROM in all joints; non-tender to palpation; no edema; normal capillary refill; no cyanosis    SKIN: Normal color for age and race; warm NEURO: Moves all extremities equally PSYCH: The patient's mood and manner are appropriate. Grooming and personal hygiene are appropriate.  MEDICAL DECISION MAKING: Patient here with left ear pain, sore throat, left submandibular swelling. Large mass in the submandibular region which may be adenopathy versus infected, blocked salivary gland. Less likely malignancy given acute onset. Patient reports she is taking Tylenol 3 at home for chronic pain which is not helping with this pain. Will give dose of phenytoin the ED. Will obtain a CT scan with IV contrast for further evaluation of this mass. Will obtain basic labs.  ED PROGRESS: Labs unremarkable other than creatinine 1.49. She has received IV fluids in the ED. Drinking without difficulty. CT scan shows left submandibular sialolithiasis and probable sialadenitis. We'll discharge home on Keflex. We'll give her and he follow-up as needed. We'll discharge with prescription for Vicodin. Have told her to not take her Tylenol 3 when taking this medication. Have advised gentle massage, heat to the area  and sour tarts. We'll give and he follow-up information. Discussed return precautions. She verbalizes understanding and is comfortable with plan.     [place], DO 04/19/14 2101

## 2014-04-19 NOTE — ED Notes (Signed)
Pain lt ear   And swelling lt submandibular area for 2 days. No fever or sore throat

## 2014-04-22 LAB — CULTURE, GROUP A STREP: Strep A Culture: NEGATIVE

## 2014-04-29 ENCOUNTER — Encounter (HOSPITAL_COMMUNITY): Payer: Self-pay | Admitting: Emergency Medicine

## 2014-04-29 ENCOUNTER — Emergency Department (HOSPITAL_COMMUNITY)
Admission: EM | Admit: 2014-04-29 | Discharge: 2014-04-29 | Disposition: A | Discharge status: Home / Self Care | Payer: Medicare Other | Attending: Emergency Medicine | Admitting: Emergency Medicine

## 2014-04-29 DIAGNOSIS — Z794 Long term (current) use of insulin: Secondary | ICD-10-CM | POA: Diagnosis not present

## 2014-04-29 DIAGNOSIS — I10 Essential (primary) hypertension: Secondary | ICD-10-CM | POA: Diagnosis not present

## 2014-04-29 DIAGNOSIS — Z87440 Personal history of urinary (tract) infections: Secondary | ICD-10-CM | POA: Diagnosis not present

## 2014-04-29 DIAGNOSIS — Z7982 Long term (current) use of aspirin: Secondary | ICD-10-CM | POA: Diagnosis not present

## 2014-04-29 DIAGNOSIS — K1120 Sialoadenitis, unspecified: Secondary | ICD-10-CM | POA: Insufficient documentation

## 2014-04-29 DIAGNOSIS — H9202 Otalgia, left ear: Secondary | ICD-10-CM | POA: Diagnosis present

## 2014-04-29 DIAGNOSIS — Z8739 Personal history of other diseases of the musculoskeletal system and connective tissue: Secondary | ICD-10-CM | POA: Insufficient documentation

## 2014-04-29 DIAGNOSIS — E119 Type 2 diabetes mellitus without complications: Secondary | ICD-10-CM | POA: Diagnosis not present

## 2014-04-29 DIAGNOSIS — Z792 Long term (current) use of antibiotics: Secondary | ICD-10-CM | POA: Diagnosis not present

## 2014-04-29 MED ORDER — IBUPROFEN 800 MG PO TABS: 800.0000 mg | ORAL_TABLET | Freq: Three times a day (TID) | ORAL | Status: DC

## 2014-04-29 NOTE — Discharge Instructions (Signed)
Please call your doctor for a followup appointment within 24-48 hours. When you talk to your doctor please let them know that you were seen in the emergency department and have them acquire all of your records so that they can discuss the findings with you and formulate a treatment plan to fully care for your new and ongoing problems. ° °

## 2014-04-29 NOTE — ED Provider Notes (Signed)
CSN: [id number]     Arrival date & time 04/29/14  [id number] History   First MD Initiated Contact with Patient 04/29/14 [phone]     Chief Complaint  Patient presents with  . Otalgia     (Consider location/radiation/quality/duration/timing/severity/associated sxs/prior Treatment) HPI Comments: 74 year old female presents with ongoing swelling of the left submandibular area. This has been persistent, gradually improving but has times where she has increased pain. She has been taking medications for pain at home, antibiotics, denies fevers chills nausea vomiting difficulty chewing, has some radiation of the pain to her left ear. She had been seen several days ago and had a CT scan showing a blocked salivary gland  Patient is a 74 y.o. female presenting with ear pain. The history is provided by the patient.  Otalgia Associated symptoms: no fever     Past Medical History  Diagnosis Date  . Diabetes mellitus   . Hypertension   . UTI (lower urinary tract infection)   . Scoliosis    Past Surgical History  Procedure Laterality Date  . Eye surgery    . 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 No pertinent family history. History  Substance Use Topics  . Smoking status: Never Smoker   . Smokeless tobacco: Not on file  . Alcohol Use: No   OB History    No data available     Review of Systems  Constitutional: Negative for fever.  HENT: Positive for ear pain.       Allergies  Review of patient's allergies indicates no known allergies.  Home Medications   Prior to Admission medications   Medication Sig Start Date End Date Taking? Authorizing Provider  acetaminophen-codeine (TYLENOL #3) 300-30 MG per tablet Take 1 tablet by mouth every 12 (twelve) hours as needed. For pain    Historical Provider, MD  aspirin EC 81 MG tablet Take 81 mg by mouth daily.    Historical Provider, MD  cephALEXin (KEFLEX) 500 MG capsule Take 1 capsule (500 mg total) by mouth 4 (four) times daily. 04/19/14    Kristen N Ward, DO  cloNIDine (CATAPRES) 0.1 MG tablet Take 0.1 mg by mouth 2 (two) times daily. For blood pressure    Historical Provider, MD  diltiazem (CARDIZEM CD) 360 MG 24 hr capsule Take 360 mg by mouth daily.    Historical Provider, MD  furosemide (LASIX) 20 MG tablet Take 20 mg by mouth every other day.    Historical Provider, MD  hydrALAZINE (APRESOLINE) 10 MG tablet Take 10 mg by mouth 3 (three) times daily.    Historical Provider, MD  HYDROcodone-acetaminophen (NORCO/VICODIN) 5-325 MG per tablet Take 1 tablet by mouth every 6 (six) hours as needed. 04/19/14   Kristen N Ward, DO  ibuprofen (ADVIL,MOTRIN) 800 MG tablet Take 1 tablet (800 mg total) by mouth 3 (three) times daily. 04/29/14   Noemi Chapel, MD  insulin aspart protamine-insulin aspart (NOVOLOG MIX 70/30 FLEXPEN) (70-30) 100 UNIT/ML injection Inject 20-38 Units into the skin 2 (two) times daily. *Inject 38 units every day in the morning and 20 units in the evening*    Historical Provider, MD  lisinopril-hydrochlorothiazide (PRINZIDE,ZESTORETIC) 20-12.5 MG per tablet Take 1 tablet by mouth daily.    Historical Provider, MD  olopatadine (PATANOL) 0.1 % ophthalmic solution Place 1 drop into both eyes 2 (two) times daily.    Historical Provider, MD  omeprazole (PRILOSEC) 20 MG capsule Take 20 mg by mouth daily. For acid reflux    Historical  Provider, MD  potassium chloride SA (K-DUR,KLOR-CON) 20 MEQ tablet Take 20 mEq by mouth daily.    Historical Provider, MD  risedronate (ACTONEL) 150 MG tablet Take 150 mg by mouth every 30 (thirty) days. with water on empty stomach, nothing by mouth or lie down for next 30 minutes.    Historical Provider, MD   BP 147/92 mmHg  Pulse 101  Temp(Src) 98.2 F (36.8 C) (Oral)  Resp 18  Ht 5\' 7"  (1.702 m)  Wt 155 lb (70.308 kg)  BMI 24.27 kg/m2  SpO2 100% Physical Exam  Constitutional: She appears well-developed and well-nourished.  HENT:  Head: Normocephalic and atraumatic.  Normal appearing  tympanic membrane on the left, no tenderness under the tongue, no tenderness in the cheek, no swelling, no redness, no drainage, no purulence, clear oropharynx  Eyes: Conjunctivae are normal. Right eye exhibits no discharge. Left eye exhibits no discharge.  Neck:  Mild swelling in the left submandibular area, no trismus or torticollis, no surrounding lymphadenopathy  Pulmonary/Chest: Effort normal. No respiratory distress.  Neurological: She is alert. Coordination normal.  Skin: Skin is warm and dry. No rash noted. She is not diaphoretic. No erythema.  Psychiatric: She has a normal mood and affect.  Nursing note and vitals reviewed.   ED Course  Procedures (including critical care time) Labs Review Labs Reviewed - No data to display  Imaging Review No results found.    MDM   Final diagnoses:  Submandibular sialoadenitis    Well-appearing, vital signs normal, stable for discharge, suggested lemon drop candies, ibuprofen, warm compresses, stable for discharge to follow-up with ENT, phone numbers given.    Noemi Chapel, MD 04/29/14 [phone]

## 2014-04-29 NOTE — ED Notes (Addendum)
Pt reports seen for same on 04/19/14. Pt reports left ear pain, sore throat returned this am. Pt reports has not been able to follow-up with ENT at this time.

## 2014-04-30 DIAGNOSIS — I10 Essential (primary) hypertension: Secondary | ICD-10-CM | POA: Diagnosis not present

## 2014-04-30 DIAGNOSIS — K118 Other diseases of salivary glands: Secondary | ICD-10-CM | POA: Diagnosis not present

## 2014-05-02 DIAGNOSIS — K115 Sialolithiasis: Secondary | ICD-10-CM | POA: Diagnosis not present

## 2014-05-15 DIAGNOSIS — K115 Sialolithiasis: Secondary | ICD-10-CM | POA: Diagnosis not present

## 2014-05-18 DIAGNOSIS — E1342 Other specified diabetes mellitus with diabetic polyneuropathy: Secondary | ICD-10-CM | POA: Diagnosis not present

## 2014-05-18 DIAGNOSIS — L609 Nail disorder, unspecified: Secondary | ICD-10-CM | POA: Diagnosis not present

## 2014-06-01 ENCOUNTER — Ambulatory Visit (INDEPENDENT_AMBULATORY_CARE_PROVIDER_SITE_OTHER): Payer: Medicare Other | Admitting: Urology

## 2014-06-01 DIAGNOSIS — N390 Urinary tract infection, site not specified: Secondary | ICD-10-CM | POA: Diagnosis not present

## 2014-06-01 DIAGNOSIS — Z87440 Personal history of urinary (tract) infections: Secondary | ICD-10-CM

## 2014-06-18 DIAGNOSIS — D229 Melanocytic nevi, unspecified: Secondary | ICD-10-CM | POA: Diagnosis not present

## 2014-06-18 DIAGNOSIS — E119 Type 2 diabetes mellitus without complications: Secondary | ICD-10-CM | POA: Diagnosis not present

## 2014-06-19 DIAGNOSIS — R262 Difficulty in walking, not elsewhere classified: Secondary | ICD-10-CM | POA: Diagnosis not present

## 2014-06-23 ENCOUNTER — Emergency Department (HOSPITAL_COMMUNITY)
Admission: EM | Admit: 2014-06-23 | Discharge: 2014-06-23 | Disposition: A | Discharge status: Home / Self Care | Payer: Medicare Other | Attending: Emergency Medicine | Admitting: Emergency Medicine

## 2014-06-23 ENCOUNTER — Encounter (HOSPITAL_COMMUNITY): Payer: Self-pay | Admitting: Emergency Medicine

## 2014-06-23 DIAGNOSIS — K115 Sialolithiasis: Secondary | ICD-10-CM | POA: Diagnosis not present

## 2014-06-23 DIAGNOSIS — M419 Scoliosis, unspecified: Secondary | ICD-10-CM | POA: Diagnosis not present

## 2014-06-23 DIAGNOSIS — Z7982 Long term (current) use of aspirin: Secondary | ICD-10-CM | POA: Diagnosis not present

## 2014-06-23 DIAGNOSIS — M2740 Unspecified cyst of jaw: Secondary | ICD-10-CM | POA: Diagnosis present

## 2014-06-23 DIAGNOSIS — Z794 Long term (current) use of insulin: Secondary | ICD-10-CM | POA: Insufficient documentation

## 2014-06-23 DIAGNOSIS — Z87440 Personal history of urinary (tract) infections: Secondary | ICD-10-CM | POA: Insufficient documentation

## 2014-06-23 DIAGNOSIS — Z791 Long term (current) use of non-steroidal anti-inflammatories (NSAID): Secondary | ICD-10-CM | POA: Diagnosis not present

## 2014-06-23 DIAGNOSIS — I10 Essential (primary) hypertension: Secondary | ICD-10-CM | POA: Diagnosis not present

## 2014-06-23 DIAGNOSIS — E119 Type 2 diabetes mellitus without complications: Secondary | ICD-10-CM | POA: Insufficient documentation

## 2014-06-23 DIAGNOSIS — Z792 Long term (current) use of antibiotics: Secondary | ICD-10-CM | POA: Insufficient documentation

## 2014-06-23 DIAGNOSIS — Z79899 Other long term (current) drug therapy: Secondary | ICD-10-CM | POA: Insufficient documentation

## 2014-06-23 MED ORDER — IBUPROFEN 400 MG PO TABS
400.0000 mg | ORAL_TABLET | Freq: Once | ORAL | Status: AC
  Administered 2014-06-23: 400 mg via ORAL
  Filled 2014-06-23: qty 1

## 2014-06-23 MED ORDER — CEPHALEXIN 500 MG PO CAPS: 500.0000 mg | ORAL_CAPSULE | Freq: Four times a day (QID) | ORAL | Status: DC

## 2014-06-23 MED ORDER — CEPHALEXIN 500 MG PO CAPS
500.0000 mg | ORAL_CAPSULE | Freq: Once | ORAL | Status: AC
  Administered 2014-06-23: 500 mg via ORAL
  Filled 2014-06-23: qty 1

## 2014-06-23 MED ORDER — IBUPROFEN 600 MG PO TABS: 600.0000 mg | ORAL_TABLET | Freq: Four times a day (QID) | ORAL | Status: DC | PRN

## 2014-06-23 NOTE — ED Notes (Addendum)
Pt reports seen for same in the past. Pt reports is supposed to follow-up with ENT but reports office is closed today. Pt reports continued pain. Moderate "knot" noted under tongue.pt also reports bleeding this am. Minimal bleeding noted at this time. Airway patent.

## 2014-06-23 NOTE — Discharge Instructions (Signed)
Salivary Stone °Your exam shows you have a stone in one of your saliva glands. These small stones form around a mucous plug in the ducts of the glands and cause the saliva in the gland to be blocked. This makes the gland swollen and painful, especially when you eat. If repeated episodes occur, the gland can become infected. Sometimes these stones can be seen on x-ray. °Treatment includes stimulating the production of saliva to push the stone out. You should suck on a lemon or sour candies several times daily. Antibiotic medicine may be needed if the gland is infected. Increasing fluids, applying warm compresses to the swollen area 3-4 times daily, and massaging the gland from back to front may encourage drainage and passage of the stone. °Surgical treatment to remove the stone is sometimes necessary, so proper medical follow up is very important. Call your doctor for an appointment as recommended. Call right away if you have a high fever, severe headache, vomiting, uncontrolled pain, or other serious symptoms. °Document Released: 01/30/2004 Document Revised: 03/16/2011 Document Reviewed: 12/22/2004 °[employer]® Patient Information ©2015 [employer]. This information is not intended to replace advice given to you by your health care provider. Make sure you discuss any questions you have with your health care provider. ° °

## 2014-06-23 NOTE — ED Provider Notes (Signed)
CSN: [id number]     Arrival date & time 06/23/14  0932 History   First MD Initiated Contact with Patient 06/23/14 [phone]     Chief Complaint  Patient presents with  . Cyst     (Consider location/radiation/quality/duration/timing/severity/associated sxs/prior Treatment) HPI   [name] is a 74 y.o. female who presents to the Emergency Department complaining of pain to her left jaw.  She reports hx of a left salivary duct stone that has been present two months.  She states this is a reoccurring problem  She is followed by one of the ENT's in [place] but she cannot recall his name.  States that pain became worse aFriday evening.  She is taking tylenol #3 without relief.  She denies difficulty swallowing, fever, chills, vomiting and difficulty breathing.    Past Medical History  Diagnosis Date  . Diabetes mellitus   . Hypertension   . UTI (lower urinary tract infection)   . Scoliosis    Past Surgical History  Procedure Laterality Date  . Eye surgery    . 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 No pertinent family history. History  Substance Use Topics  . Smoking status: Never Smoker   . Smokeless tobacco: Not on file  . Alcohol Use: No   OB History    No data available     Review of Systems  Constitutional: Negative for fever, chills, activity change and appetite change.  HENT: Positive for facial swelling and sore throat. Negative for congestion, ear pain, trouble swallowing and voice change.   Eyes: Negative for pain and visual disturbance.  Respiratory: Negative for cough and shortness of breath.   Gastrointestinal: Negative for nausea, vomiting and abdominal pain.  Musculoskeletal: Negative for neck pain and neck stiffness.  Skin: Negative for color change and rash.  Neurological: Negative for dizziness, facial asymmetry, speech difficulty, numbness and headaches.  Hematological: Negative for adenopathy.  All other systems reviewed and are  negative.     Allergies  Review of patient's allergies indicates no known allergies.  Home Medications   Prior to Admission medications   Medication Sig Start Date End Date Taking? Authorizing Provider  acetaminophen-codeine (TYLENOL #3) 300-30 MG per tablet Take 1 tablet by mouth every 12 (twelve) hours as needed. For pain    Historical Provider, MD  aspirin EC 81 MG tablet Take 81 mg by mouth daily.    Historical Provider, MD  cephALEXin (KEFLEX) 500 MG capsule Take 1 capsule (500 mg total) by mouth 4 (four) times daily. 04/19/14   Kristen N Ward, DO  cloNIDine (CATAPRES) 0.1 MG tablet Take 0.1 mg by mouth 2 (two) times daily. For blood pressure    Historical Provider, MD  diltiazem (CARDIZEM CD) 360 MG 24 hr capsule Take 360 mg by mouth daily.    Historical Provider, MD  furosemide (LASIX) 20 MG tablet Take 20 mg by mouth every other day.    Historical Provider, MD  hydrALAZINE (APRESOLINE) 10 MG tablet Take 10 mg by mouth 3 (three) times daily.    Historical Provider, MD  HYDROcodone-acetaminophen (NORCO/VICODIN) 5-325 MG per tablet Take 1 tablet by mouth every 6 (six) hours as needed. 04/19/14   Kristen N Ward, DO  ibuprofen (ADVIL,MOTRIN) 800 MG tablet Take 1 tablet (800 mg total) by mouth 3 (three) times daily. 04/29/14   Noemi Chapel, MD  insulin aspart protamine-insulin aspart (NOVOLOG MIX 70/30 FLEXPEN) (70-30) 100 UNIT/ML injection Inject 20-38 Units into the skin 2 (two)  times daily. *Inject 38 units every day in the morning and 20 units in the evening*    Historical Provider, MD  lisinopril-hydrochlorothiazide (PRINZIDE,ZESTORETIC) 20-12.5 MG per tablet Take 1 tablet by mouth daily.    Historical Provider, MD  olopatadine (PATANOL) 0.1 % ophthalmic solution Place 1 drop into both eyes 2 (two) times daily.    Historical Provider, MD  omeprazole (PRILOSEC) 20 MG capsule Take 20 mg by mouth daily. For acid reflux    Historical Provider, MD  potassium chloride SA (K-DUR,KLOR-CON) 20  MEQ tablet Take 20 mEq by mouth daily.    Historical Provider, MD  risedronate (ACTONEL) 150 MG tablet Take 150 mg by mouth every 30 (thirty) days. with water on empty stomach, nothing by mouth or lie down for next 30 minutes.    Historical Provider, MD   BP 116/76 mmHg  Pulse 98  Temp(Src) 98.2 F (36.8 C) (Oral)  Resp 18  Ht 5\' 5"  (1.651 m)  Wt 163 lb (73.936 kg)  BMI 27.12 kg/m2  SpO2 97% Physical Exam  Constitutional: She is oriented to person, place, and time. She appears well-developed and well-nourished. No distress.  HENT:  Head: Normocephalic and atraumatic.  Right Ear: Tympanic membrane and ear canal normal.  Left Ear: Tympanic membrane and ear canal normal.  Mouth/Throat: Uvula is midline, oropharynx is clear and moist and mucous membranes are normal. No oral lesions. No trismus in the jaw. No uvula swelling. No posterior oropharyngeal edema, posterior oropharyngeal erythema or tonsillar abscesses.  Large, mobile mass to left submandibular area.  No discoloration or fluctuance.  No dental tenderness.    Neck: Normal range of motion. Neck supple.  Cardiovascular: Normal rate, regular rhythm and normal heart sounds.   No murmur heard. Pulmonary/Chest: Effort normal and breath sounds normal. No respiratory distress.  Abdominal: There is no splenomegaly.  Musculoskeletal: Normal range of motion.  Lymphadenopathy:    She has no cervical adenopathy.  Neurological: She is alert and oriented to person, place, and time. She exhibits normal muscle tone. Coordination normal.  Skin: Skin is warm and dry.  Nursing note and vitals reviewed.   ED Course  Procedures (including critical care time) Labs Review Labs Reviewed - No data to display  Imaging Review No results found.   EKG Interpretation None      MDM   Final diagnoses:  Sialolithiasis of submandibular gland   Pt is well appearing.  Non-toxic, airway patent.    Has confirmed salivary duct stone from 04/19/14.   She is followed by ENT for this.  Here today requesting pain control.  rx for keflex and ibuprofen.  Currently taking tylenol #3.  Agree to f/u with her ENT on Monday.         Kem Parkinson, PA-C 06/24/14 [place], DO 06/26/14 [phone]

## 2014-06-26 ENCOUNTER — Encounter (HOSPITAL_COMMUNITY): Payer: Self-pay | Admitting: *Deleted

## 2014-06-26 ENCOUNTER — Ambulatory Visit: Payer: Self-pay | Admitting: Otolaryngology

## 2014-06-26 DIAGNOSIS — K115 Sialolithiasis: Secondary | ICD-10-CM | POA: Diagnosis not present

## 2014-06-26 NOTE — H&P (Signed)
  Assessment  Sialolithiasis of submandibular gland (527.5) (K11.5). Discussed  She started having severe pain and swelling again over the weekend. She was seen in the emergency department and given antibiotics. Currently she feels a bit better. On examination, she is in no distress. The left submandibular gland is firm and enlarged, moderately tender. There is a possible stone palpable in the posterior lateral floor of mouth. Recommend attempted intraoral excision of submandibular stone, with possible excision of submandibular gland if we are not able to remove the stone. This will be done under general anesthesia at [facility]. She may stay overnight. Reason For Visit  Recheck salivary stone. Allergies  No Known Drug Allergies. Current Meds  Ibuprofen 600 MG Oral Tablet;; RPT Ibuprofen 800 MG Oral Tablet;; RPT CloNIDine HCl - 0.1 MG Oral Tablet;; RPT Furosemide 20 MG Oral Tablet;; RPT HydrALAZINE HCl - 10 MG Oral Tablet;; RPT Acetaminophen-Codeine #3 300-30 MG Oral Tablet;; RPT Cephalexin 500 MG Oral Capsule;; RPT Lisinopril-Hydrochlorothiazide 20-12.5 MG Oral Tablet;; RPT NovoLOG SOLN;; RPT Omeprazole 20 MG Oral Capsule Delayed Release;; RPT Hydrocodone-Acetaminophen 5-325 MG Oral Tablet;; RPT Diltiazem HCl ER Beads 360 MG Oral Capsule Extended Release 24 Hour;; RPT Potassium Chloride Crys ER 20 MEQ Oral Tablet Extended Release;; RPT Accu-Chek Multiclix Lancets Miscellaneous;; RPT Accu-Chek Aviva Plus In Vitro Strip;; RPT Accu-Chek Aviva Plus w/Device Kit;; RPT Risedronate Sodium 150 MG Oral Tablet;; RPT Olopatadine HCl - 0.1 % Ophthalmic Solution;; RPT. Active Problems  Diabetes   (250.00) (E11.9) Hypercholesteremia   (272.0) (E78.0) Hypertension   (401.9) (I10) Sialolithiasis of submandibular gland   (527.5) (K11.5). Family Hx  Family history of cancer: Mother,Daughter (V16.9) (Z80.9) Family history of diabetes mellitus: Mother,Daughter (V18.0) (Z83.3) Family history of  hypertension: Mother,Daughter (V17.49) (Z82.49). Personal Hx  Never smoker No alcohol use No caffeine use Non-smoker (V49.89) (Z78.9). Signature  Electronically signed by : Trampas Stettner  M.D.; 06/26/2014 1:10 PM EST.  

## 2014-06-26 NOTE — Progress Notes (Signed)
Mrs [name] states that Dr Berdine Addison has given her  Instructions on what to do with Insulin when she is going to be NPO.  "CBGs in am usually run around 12."  Does have some low CBGs around 4 or 5 am at times, usually drinks Orange Juice.  Patient does have Glucose tablets.  I I gave patient the following insrtuctions: If CBG less than 70-, take 4 glucose with small amount of water,  Recheck CBG in 15 minutes, if CBG still less than 70- Call [facility]; if it is before [facility] opens repeat treatment.  I asked patient to note the time and amount of medications and liquid.  Patient repeated instructions to me.

## 2014-06-27 ENCOUNTER — Encounter (HOSPITAL_COMMUNITY)
Admission: RE | Disposition: A | Discharge status: Home / Self Care | Payer: Self-pay | Source: Ambulatory Visit | Attending: Otolaryngology

## 2014-06-27 ENCOUNTER — Ambulatory Visit (HOSPITAL_COMMUNITY): Payer: Medicare Other | Admitting: Certified Registered"

## 2014-06-27 ENCOUNTER — Encounter (HOSPITAL_COMMUNITY): Payer: Self-pay | Admitting: Certified Registered"

## 2014-06-27 ENCOUNTER — Ambulatory Visit (HOSPITAL_COMMUNITY)
Admission: RE | Admit: 2014-06-27 | Discharge: 2014-06-27 | Disposition: A | Discharge status: Home / Self Care | Payer: Medicare Other | Source: Ambulatory Visit | Attending: Otolaryngology | Admitting: Otolaryngology

## 2014-06-27 DIAGNOSIS — K219 Gastro-esophageal reflux disease without esophagitis: Secondary | ICD-10-CM | POA: Diagnosis not present

## 2014-06-27 DIAGNOSIS — M1990 Unspecified osteoarthritis, unspecified site: Secondary | ICD-10-CM | POA: Insufficient documentation

## 2014-06-27 DIAGNOSIS — E780 Pure hypercholesterolemia: Secondary | ICD-10-CM | POA: Diagnosis not present

## 2014-06-27 DIAGNOSIS — I10 Essential (primary) hypertension: Secondary | ICD-10-CM | POA: Diagnosis not present

## 2014-06-27 DIAGNOSIS — K115 Sialolithiasis: Secondary | ICD-10-CM | POA: Insufficient documentation

## 2014-06-27 DIAGNOSIS — E119 Type 2 diabetes mellitus without complications: Secondary | ICD-10-CM | POA: Diagnosis not present

## 2014-06-27 DIAGNOSIS — Z794 Long term (current) use of insulin: Secondary | ICD-10-CM | POA: Diagnosis not present

## 2014-06-27 HISTORY — PX: MASS EXCISION: SHX2000

## 2014-06-27 HISTORY — DX: Gastro-esophageal reflux disease without esophagitis: K21.9

## 2014-06-27 LAB — BASIC METABOLIC PANEL
Anion gap: 10 (ref 5–15)
BUN: 17 mg/dL (ref 6–20)
CALCIUM: 9.8 mg/dL (ref 8.9–10.3)
CO2: 29 mmol/L (ref 22–32)
CREATININE: 1.61 mg/dL — AB (ref 0.44–1.00)
Chloride: 99 mmol/L — ABNORMAL LOW (ref 101–111)
GFR calc non Af Amer: 31 mL/min — ABNORMAL LOW (ref >60)
GFR, EST AFRICAN AMERICAN: 36 mL/min — AB (ref >60)
Glucose, Bld: 261 mg/dL — ABNORMAL HIGH (ref 65–99)
Potassium: 3.3 mmol/L — ABNORMAL LOW (ref 3.5–5.1)
SODIUM: 138 mmol/L (ref 135–145)

## 2014-06-27 LAB — CBC
HCT: 38.7 % (ref 36.0–46.0)
Hemoglobin: 12.9 g/dL (ref 12.0–15.0)
MCH: 27.2 pg (ref 26.0–34.0)
MCHC: 33.3 g/dL (ref 30.0–36.0)
MCV: 81.5 fL (ref 78.0–100.0)
PLATELETS: 293 10*3/uL (ref 150–400)
RBC: 4.75 MIL/uL (ref 3.87–5.11)
RDW: 13.6 % (ref 11.5–15.5)
WBC: 5.6 10*3/uL (ref 4.0–10.5)

## 2014-06-27 LAB — GLUCOSE, CAPILLARY
GLUCOSE-CAPILLARY: 244 mg/dL — AB (ref 65–99)
Glucose-Capillary: 219 mg/dL — ABNORMAL HIGH (ref 65–99)
Glucose-Capillary: 271 mg/dL — ABNORMAL HIGH (ref 65–99)

## 2014-06-27 SURGERY — EXCISION MASS
Anesthesia: General

## 2014-06-27 MED ORDER — ACETAMINOPHEN 325 MG PO TABS: 325.0000 mg | ORAL_TABLET | ORAL | Status: DC | PRN

## 2014-06-27 MED ORDER — FENTANYL CITRATE (PF) 100 MCG/2ML IJ SOLN
INTRAMUSCULAR | Status: AC
  Filled 2014-06-27: qty 2

## 2014-06-27 MED ORDER — NEOSTIGMINE METHYLSULFATE 10 MG/10ML IV SOLN
INTRAVENOUS | Status: DC | PRN
  Administered 2014-06-27: 4 mg via INTRAVENOUS

## 2014-06-27 MED ORDER — HYDROCODONE-ACETAMINOPHEN 7.5-325 MG/15ML PO SOLN: 10.0000 mL | Freq: Four times a day (QID) | ORAL | Status: DC | PRN

## 2014-06-27 MED ORDER — FENTANYL CITRATE (PF) 100 MCG/2ML IJ SOLN
25.0000 ug | INTRAMUSCULAR | Status: DC | PRN
  Administered 2014-06-27 (×3): 50 ug via INTRAVENOUS

## 2014-06-27 MED ORDER — HYDROCODONE-ACETAMINOPHEN 7.5-325 MG PO TABS: 1.0000 | ORAL_TABLET | Freq: Four times a day (QID) | ORAL | Status: DC | PRN

## 2014-06-27 MED ORDER — LIDOCAINE HCL (CARDIAC) 20 MG/ML IV SOLN
INTRAVENOUS | Status: DC | PRN
  Administered 2014-06-27: 60 mg via INTRAVENOUS

## 2014-06-27 MED ORDER — GLYCOPYRROLATE 0.2 MG/ML IJ SOLN
INTRAMUSCULAR | Status: DC | PRN
  Administered 2014-06-27: .6 mg via INTRAVENOUS

## 2014-06-27 MED ORDER — HYDROCODONE-ACETAMINOPHEN 7.5-325 MG PO TABS
ORAL_TABLET | ORAL | Status: AC
  Administered 2014-06-27: 1
  Filled 2014-06-27: qty 1

## 2014-06-27 MED ORDER — LIDOCAINE-EPINEPHRINE 1 %-1:100000 IJ SOLN
INTRAMUSCULAR | Status: AC
  Filled 2014-06-27: qty 1

## 2014-06-27 MED ORDER — PROMETHAZINE HCL 25 MG RE SUPP: 25.0000 mg | Freq: Four times a day (QID) | RECTAL | Status: DC | PRN

## 2014-06-27 MED ORDER — ACETAMINOPHEN 160 MG/5ML PO SOLN
325.0000 mg | ORAL | Status: DC | PRN
  Filled 2014-06-27: qty 20.3

## 2014-06-27 MED ORDER — PROPOFOL 10 MG/ML IV BOLUS
INTRAVENOUS | Status: AC
  Filled 2014-06-27: qty 20

## 2014-06-27 MED ORDER — BACITRACIN ZINC 500 UNIT/GM EX OINT
TOPICAL_OINTMENT | CUTANEOUS | Status: AC
  Filled 2014-06-27: qty 28.35

## 2014-06-27 MED ORDER — BACITRACIN ZINC 500 UNIT/GM EX OINT
TOPICAL_OINTMENT | CUTANEOUS | Status: DC | PRN
  Administered 2014-06-27: 1 via TOPICAL

## 2014-06-27 MED ORDER — OXYCODONE HCL 5 MG PO TABS
ORAL_TABLET | ORAL | Status: AC
  Filled 2014-06-27: qty 1

## 2014-06-27 MED ORDER — LACTATED RINGERS IV SOLN
INTRAVENOUS | Status: DC
  Administered 2014-06-27: 12:00:00 via INTRAVENOUS

## 2014-06-27 MED ORDER — OXYCODONE HCL 5 MG PO TABS
5.0000 mg | ORAL_TABLET | Freq: Once | ORAL | Status: AC | PRN
  Administered 2014-06-27: 5 mg via ORAL

## 2014-06-27 MED ORDER — 0.9 % SODIUM CHLORIDE (POUR BTL) OPTIME
TOPICAL | Status: DC | PRN
  Administered 2014-06-27: 1000 mL

## 2014-06-27 MED ORDER — FENTANYL CITRATE (PF) 250 MCG/5ML IJ SOLN
INTRAMUSCULAR | Status: AC
  Filled 2014-06-27: qty 5

## 2014-06-27 MED ORDER — LIDOCAINE-EPINEPHRINE 1 %-1:100000 IJ SOLN
INTRAMUSCULAR | Status: DC | PRN
Start: 2014-06-27 — End: 2014-06-27
  Administered 2014-06-27: 4 mL

## 2014-06-27 MED ORDER — PROPOFOL 10 MG/ML IV BOLUS
INTRAVENOUS | Status: DC | PRN
  Administered 2014-06-27: 160 mg via INTRAVENOUS
  Administered 2014-06-27: 20 mg via INTRAVENOUS

## 2014-06-27 MED ORDER — OXYCODONE HCL 5 MG/5ML PO SOLN: 5.0000 mg | Freq: Once | ORAL | Status: AC | PRN

## 2014-06-27 MED ORDER — CEPHALEXIN 500 MG PO CAPS: 500.0000 mg | ORAL_CAPSULE | Freq: Three times a day (TID) | ORAL | Status: DC

## 2014-06-27 MED ORDER — ROCURONIUM BROMIDE 100 MG/10ML IV SOLN
INTRAVENOUS | Status: DC | PRN
  Administered 2014-06-27: 40 mg via INTRAVENOUS

## 2014-06-27 MED ORDER — FENTANYL CITRATE (PF) 250 MCG/5ML IJ SOLN
INTRAMUSCULAR | Status: DC | PRN
  Administered 2014-06-27 (×3): 50 ug via INTRAVENOUS

## 2014-06-27 MED ORDER — ONDANSETRON HCL 4 MG/2ML IJ SOLN
INTRAMUSCULAR | Status: DC | PRN
  Administered 2014-06-27: 4 mg via INTRAVENOUS

## 2014-06-27 MED ORDER — CEFAZOLIN SODIUM-DEXTROSE 2-3 GM-% IV SOLR
2.0000 g | INTRAVENOUS | Status: AC
  Administered 2014-06-27: 2 g via INTRAVENOUS
  Filled 2014-06-27: qty 50

## 2014-06-27 SURGICAL SUPPLY — 74 items
APPLIER CLIP 9.375 SM OPEN (CLIP)
ATTRACTOMAT 16X20 MAGNETIC DRP (DRAPES) IMPLANT
BLADE SURG 15 STRL LF DISP TIS (BLADE) IMPLANT
BLADE SURG 15 STRL SS (BLADE)
CANISTER SUCTION 2500CC (MISCELLANEOUS) ×3 IMPLANT
CLEANER TIP ELECTROSURG 2X2 (MISCELLANEOUS) ×3 IMPLANT
CLIP APPLIE 9.375 SM OPEN (CLIP) IMPLANT
CONT SPEC 4OZ CLIKSEAL STRL BL (MISCELLANEOUS) ×3 IMPLANT
CORDS BIPOLAR (ELECTRODE) ×3 IMPLANT
COVER SURGICAL LIGHT HANDLE (MISCELLANEOUS) ×3 IMPLANT
DECANTER SPIKE VIAL GLASS SM (MISCELLANEOUS) ×3 IMPLANT
DERMABOND ADVANCED (GAUZE/BANDAGES/DRESSINGS) ×2
DERMABOND ADVANCED .7 DNX12 (GAUZE/BANDAGES/DRESSINGS) ×1 IMPLANT
DRAIN CHANNEL 15F RND FF W/TCR (WOUND CARE) IMPLANT
DRAIN PENROSE 1/4X12 LTX STRL (WOUND CARE) IMPLANT
DRAIN SNY 10 ROU (WOUND CARE) IMPLANT
DRAPE INCISE 23X17 IOBAN STRL (DRAPES)
DRAPE INCISE IOBAN 23X17 STRL (DRAPES) IMPLANT
DRAPE PROXIMA HALF (DRAPES) IMPLANT
DRAPE SURG 17X23 STRL (DRAPES) ×3 IMPLANT
ELECT COATED BLADE 2.86 ST (ELECTRODE) ×3 IMPLANT
ELECT PAIRED SUBDERMAL (MISCELLANEOUS)
ELECT REM PT RETURN 9FT ADLT (ELECTROSURGICAL) ×3
ELECTRODE PAIRED SUBDERMAL (MISCELLANEOUS) IMPLANT
ELECTRODE REM PT RTRN 9FT ADLT (ELECTROSURGICAL) ×1 IMPLANT
EVACUATOR SILICONE 100CC (DRAIN) ×3 IMPLANT
GAUZE SPONGE 4X4 16PLY XRAY LF (GAUZE/BANDAGES/DRESSINGS) ×3 IMPLANT
GLOVE BIO SURGEON STRL SZ7.5 (GLOVE) ×3 IMPLANT
GLOVE BIOGEL PI IND STRL 6.5 (GLOVE) ×2 IMPLANT
GLOVE BIOGEL PI INDICATOR 6.5 (GLOVE) ×4
GLOVE ECLIPSE 7.5 STRL STRAW (GLOVE) ×3 IMPLANT
GOWN STRL REUS W/ TWL LRG LVL3 (GOWN DISPOSABLE) ×2 IMPLANT
GOWN STRL REUS W/TWL LRG LVL3 (GOWN DISPOSABLE) ×4
KIT BASIN OR (CUSTOM PROCEDURE TRAY) ×3 IMPLANT
KIT ROOM TURNOVER OR (KITS) ×3 IMPLANT
LOCATOR NERVE 3 VOLT (DISPOSABLE) IMPLANT
NEEDLE 27GAX1X1/2 (NEEDLE) ×3 IMPLANT
NEEDLE 27GX1/2 REG BEVEL ECLIP (NEEDLE) IMPLANT
NEEDLE HYPO 25GX1X1/2 BEV (NEEDLE) ×3 IMPLANT
NS IRRIG 1000ML POUR BTL (IV SOLUTION) ×3 IMPLANT
PAD ARMBOARD 7.5X6 YLW CONV (MISCELLANEOUS) ×6 IMPLANT
PENCIL FOOT CONTROL (ELECTRODE) ×3 IMPLANT
PROBE NERVBE PRASS .33 (MISCELLANEOUS) IMPLANT
SPECIMEN JAR MEDIUM (MISCELLANEOUS) IMPLANT
SPECIMEN JAR SMALL (MISCELLANEOUS) ×3 IMPLANT
SPONGE INTESTINAL PEANUT (DISPOSABLE) IMPLANT
SPONGE LAP 18X18 X RAY DECT (DISPOSABLE) IMPLANT
STAPLER VISISTAT 35W (STAPLE) ×3 IMPLANT
SUT CHROMIC 3 0 PS 2 (SUTURE) ×3 IMPLANT
SUT CHROMIC 3 0 SH 27 (SUTURE) IMPLANT
SUT CHROMIC 5 0 P 3 (SUTURE) IMPLANT
SUT ETHILON 2 0 FS 18 (SUTURE) ×3 IMPLANT
SUT ETHILON 3 0 PS 1 (SUTURE) IMPLANT
SUT ETHILON 5 0 P 3 18 (SUTURE)
SUT ETHILON 5 0 PS 2 18 (SUTURE) IMPLANT
SUT NYLON ETHILON 5-0 P-3 1X18 (SUTURE) IMPLANT
SUT SILK 2 0 REEL (SUTURE) IMPLANT
SUT SILK 2 0 SH CR/8 (SUTURE) ×3 IMPLANT
SUT SILK 3 0 REEL (SUTURE) ×3 IMPLANT
SUT SILK 3 0 SH CR/8 (SUTURE) IMPLANT
SUT SILK 4 0 REEL (SUTURE) IMPLANT
SUT SILK 4 0 TIES 17X18 (SUTURE) ×3 IMPLANT
SUT VIC AB 3-0 FS2 27 (SUTURE) IMPLANT
SUT VIC AB 3-0 SH 18 (SUTURE) IMPLANT
SUT VICRYL 4-0 PS2 18IN ABS (SUTURE) IMPLANT
TOWEL OR 17X24 6PK STRL BLUE (TOWEL DISPOSABLE) ×3 IMPLANT
TRAY ENT MC OR (CUSTOM PROCEDURE TRAY) ×3 IMPLANT
TRAY FOLEY CATH 14FRSI W/METER (CATHETERS) IMPLANT
TUBE ENDOTRAC EMG 7X10.2 (MISCELLANEOUS) IMPLANT
TUBE ENDOTRAC EMG 8X11.3 (MISCELLANEOUS) IMPLANT
TUBE ENDOTRACH  EMG 6MMTUBE EN (MISCELLANEOUS)
TUBE ENDOTRACH EMG 6MMTUBE EN (MISCELLANEOUS) IMPLANT
TUBE FEEDING 10FR FLEXIFLO (MISCELLANEOUS) IMPLANT
WATER STERILE IRR 1000ML POUR (IV SOLUTION) ×3 IMPLANT

## 2014-06-27 NOTE — Progress Notes (Signed)
Report given to mega rn as caregiver

## 2014-06-27 NOTE — H&P (View-Only) (Signed)
  Assessment  Sialolithiasis of submandibular gland (527.5) (K11.5). Discussed  She started having severe pain and swelling again over the weekend. She was seen in the emergency department and given antibiotics. Currently she feels a bit better. On examination, she is in no distress. The left submandibular gland is firm and enlarged, moderately tender. There is a possible stone palpable in the posterior lateral floor of mouth. Recommend attempted intraoral excision of submandibular stone, with possible excision of submandibular gland if we are not able to remove the stone. This will be done under general anesthesia at [facility]. She may stay overnight. Reason For Visit  Recheck salivary stone. Allergies  No Known Drug Allergies. Current Meds  Ibuprofen 600 MG Oral Tablet;; RPT Ibuprofen 800 MG Oral Tablet;; RPT CloNIDine HCl - 0.1 MG Oral Tablet;; RPT Furosemide 20 MG Oral Tablet;; RPT HydrALAZINE HCl - 10 MG Oral Tablet;; RPT Acetaminophen-Codeine #3 300-30 MG Oral Tablet;; RPT Cephalexin 500 MG Oral Capsule;; RPT Lisinopril-Hydrochlorothiazide 20-12.5 MG Oral Tablet;; RPT NovoLOG SOLN;; RPT Omeprazole 20 MG Oral Capsule Delayed Release;; RPT Hydrocodone-Acetaminophen 5-325 MG Oral Tablet;; RPT Diltiazem HCl ER Beads 360 MG Oral Capsule Extended Release 24 Hour;; RPT Potassium Chloride Crys ER 20 MEQ Oral Tablet Extended Release;; RPT Accu-Chek Multiclix Lancets Miscellaneous;; RPT Accu-Chek Aviva Plus In Vitro Strip;; RPT Accu-Chek Aviva Plus w/Device Kit;; RPT Risedronate Sodium 150 MG Oral Tablet;; RPT Olopatadine HCl - 0.1 % Ophthalmic Solution;; RPT. Active Problems  Diabetes   (250.00) (E11.9) Hypercholesteremia   (272.0) (E78.0) Hypertension   (401.9) (I10) Sialolithiasis of submandibular gland   (527.5) (K11.5). Family Hx  Family history of cancer: Mother,Daughter (V16.9) (Z80.9) Family history of diabetes mellitus: Mother,Daughter (V18.0) (Z83.3) Family history of  hypertension: Mother,Daughter (V17.49) (Z82.49). Personal Hx  Never smoker No alcohol use No caffeine use Non-smoker (V49.89) (Z78.9). Signature  Electronically signed by : Izora Gala  M.D.; 06/26/2014 1:10 PM EST.

## 2014-06-27 NOTE — Transfer of Care (Signed)
Immediate Anesthesia Transfer of Care Note  Patient: [name]  Procedure(s) Performed: Procedure(s): EXCISION SUBMANDIBULAR STONE (N/A)  Patient Location: PACU  Anesthesia Type:General  Level of Consciousness: awake, alert  and oriented  Airway & Oxygen Therapy: Patient Spontanous Breathing and Patient connected to nasal cannula oxygen  Post-op Assessment: Report given to RN and Post -op Vital signs reviewed and stable  Post vital signs: Reviewed and stable  Last Vitals:  Filed Vitals:   06/27/14 1041  BP: 147/79  Pulse: 88  Temp: 36.8 C  Resp: 18    Complications: No apparent anesthesia complications

## 2014-06-27 NOTE — Op Note (Signed)
OPERATIVE REPORT  DATE OF SURGERY: 06/27/2014  PATIENT:  [name],  74 y.o. female  PRE-OPERATIVE DIAGNOSIS:  SUBMANDIBULAR STONE  POST-OPERATIVE DIAGNOSIS:  SUBMANDIBULAR STONE  PROCEDURE:  Procedure(s): EXCISION SUBMANDIBULAR STONE  SURGEON:  Beckie Salts, MD  ASSISTANTS: None  ANESTHESIA:   General   EBL:  15 ml  DRAINS: XXX   LOCAL MEDICATIONS USED:  1% Xylocaine with epinephrine  SPECIMEN:  none  COUNTS:  Correct  PROCEDURE DETAILS: The patient was taken to the operating room and placed on the operating table in the supine position. Following induction of general endotracheal anesthesia, she was draped in a standard fashion. A bite block was used to keep the mouth open. Secretions were suctioned. The floor of mouth was inspected. The submandibular duct area was inflamed and swollen. Palpation of the gland was not successful at expressing saliva on the left. The local metastatic was infiltrated into the floor mouth around the ductal opening and this far back as the gland and stone were palpable. A hard object was palpable posteriorly along the hilum of the gland, consistent with a calculus. The duct was cannulated and dilated up to #2 with lacrimal probes. Owing the dilation, a large amount of thick granular-containing silver secretions was obtained. The gland was massaged to clear out all the secretions. With the probe in place scissors were then used to incise the floor mouth through the gland as far back as the probe reached. Additional palpation revealed that the stone that had been palpable previously was no longer palpable. Was never identified. I suspect it was a loose conglomeration and fell apart when all the secretions came out. At this point no further secretions were expressible from the gland and the duct. The floor of mouth was irrigated with saline. A 4 x 4 was applied to the floor mouth while the patient was awakened from anesthesia and then extubated. She was  transferred to PACU in stable condition.    PATIENT DISPOSITION:  To PACU, stable

## 2014-06-27 NOTE — Interval H&P Note (Signed)
History and Physical Interval Note:  06/27/2014 12:46 PM  [name]  has presented today for surgery, with the diagnosis of SUBMANDIBULAR STONE  The various methods of treatment have been discussed with the patient and family. After consideration of risks, benefits and other options for treatment, the patient has consented to  Procedure(s): EXCISION SUBMANDIBULAR STONE (N/A) POSSIBLE EXCISION SUBMANDIBULAR GLAND (N/A) as a surgical intervention .  The patient's history has been reviewed, patient examined, no change in status, stable for surgery.  I have reviewed the patient's chart and labs.  Questions were answered to the patient's satisfaction.     Antwyne Pingree

## 2014-06-27 NOTE — Anesthesia Preprocedure Evaluation (Signed)
Anesthesia Evaluation  Patient identified by MRN, date of birth, ID band Patient awake    Reviewed: Allergy & Precautions, NPO status , Patient's Chart, lab work & pertinent test results  History of Anesthesia Complications Negative for: history of anesthetic complications  Airway Mallampati: II  TM Distance: >3 FB Neck ROM: Full    Dental  (+) Edentulous Upper, Edentulous Lower   Pulmonary neg pulmonary ROS,  breath sounds clear to auscultation        Cardiovascular hypertension, Pt. on medications - angina- Past MI and - CHF Rhythm:Regular     Neuro/Psych negative neurological ROS     GI/Hepatic Neg liver ROS, GERD-  Medicated and Controlled,  Endo/Other  diabetes, Type 2, Insulin Dependent, Oral Hypoglycemic Agents  Renal/GU Renal InsufficiencyRenal disease     Musculoskeletal  (+) Arthritis -,   Abdominal   Peds  Hematology   Anesthesia Other Findings   Reproductive/Obstetrics                             Anesthesia Physical Anesthesia Plan  ASA: III  Anesthesia Plan: General   Post-op Pain Management:    Induction: Intravenous  Airway Management Planned: Oral ETT  Additional Equipment: None  Intra-op Plan:   Post-operative Plan: Extubation in OR  Informed Consent: I have reviewed the patients History and Physical, chart, labs and discussed the procedure including the risks, benefits and alternatives for the proposed anesthesia with the patient or authorized representative who has indicated his/her understanding and acceptance.     Plan Discussed with: CRNA and Surgeon  Anesthesia Plan Comments:         Anesthesia Quick Evaluation

## 2014-06-27 NOTE — Discharge Instructions (Signed)
You may eat or drink whatever you like. Rinse your mouth with some salt water after eating.  You may use Tylenol and Motrin as needed for pain or use the prescription medicine if you need.

## 2014-06-27 NOTE — Anesthesia Procedure Notes (Signed)
Date/Time: 06/27/2014 1:17 PM Performed by: Tamala Fothergill S Patient Re-evaluated:Patient Re-evaluated prior to inductionOxygen Delivery Method: Circle system utilized Preoxygenation: Pre-oxygenation with 100% oxygen Laryngoscope Size: Miller and 2 Grade View: Grade I Tube type: Oral Tube size: 7.0 mm Placement Confirmation: ETT inserted through vocal cords under direct vision,  positive ETCO2 and CO2 detector Tube secured with: Tape Dental Injury: Teeth and Oropharynx as per pre-operative assessment

## 2014-06-28 ENCOUNTER — Encounter (HOSPITAL_COMMUNITY): Payer: Self-pay | Admitting: Otolaryngology

## 2014-06-30 NOTE — Anesthesia Postprocedure Evaluation (Signed)
  Anesthesia Post-op Note  Patient: [name]  Procedure(s) Performed: Procedure(s): EXCISION SUBMANDIBULAR STONE (N/A)  Patient Location: PACU  Anesthesia Type:General  Level of Consciousness: awake  Airway and Oxygen Therapy: Patient Spontanous Breathing  Post-op Pain: moderate  Post-op Assessment: Post-op Vital signs reviewed, Patient's Cardiovascular Status Stable, Respiratory Function Stable, Patent Airway, No signs of Nausea or vomiting and Pain level controlled              Post-op Vital Signs: Reviewed and stable  Last Vitals:  Filed Vitals:   06/27/14 1550  BP: 153/75  Pulse: 78  Temp:   Resp: 16    Complications: No apparent anesthesia complications

## 2014-08-03 DIAGNOSIS — E1342 Other specified diabetes mellitus with diabetic polyneuropathy: Secondary | ICD-10-CM | POA: Diagnosis not present

## 2014-08-03 DIAGNOSIS — L609 Nail disorder, unspecified: Secondary | ICD-10-CM | POA: Diagnosis not present

## 2014-08-28 DIAGNOSIS — H25812 Combined forms of age-related cataract, left eye: Secondary | ICD-10-CM | POA: Diagnosis not present

## 2014-08-28 DIAGNOSIS — H40032 Anatomical narrow angle, left eye: Secondary | ICD-10-CM | POA: Diagnosis not present

## 2014-08-31 ENCOUNTER — Encounter (HOSPITAL_COMMUNITY): Payer: Self-pay | Admitting: *Deleted

## 2014-08-31 ENCOUNTER — Emergency Department (HOSPITAL_COMMUNITY)
Admission: EM | Admit: 2014-08-31 | Discharge: 2014-08-31 | Disposition: A | Discharge status: Home / Self Care | Payer: Medicare Other | Attending: Emergency Medicine | Admitting: Emergency Medicine

## 2014-08-31 DIAGNOSIS — L03115 Cellulitis of right lower limb: Secondary | ICD-10-CM | POA: Diagnosis not present

## 2014-08-31 DIAGNOSIS — Z8739 Personal history of other diseases of the musculoskeletal system and connective tissue: Secondary | ICD-10-CM | POA: Insufficient documentation

## 2014-08-31 DIAGNOSIS — L0291 Cutaneous abscess, unspecified: Secondary | ICD-10-CM

## 2014-08-31 DIAGNOSIS — Z79899 Other long term (current) drug therapy: Secondary | ICD-10-CM | POA: Insufficient documentation

## 2014-08-31 DIAGNOSIS — E119 Type 2 diabetes mellitus without complications: Secondary | ICD-10-CM | POA: Insufficient documentation

## 2014-08-31 DIAGNOSIS — I10 Essential (primary) hypertension: Secondary | ICD-10-CM | POA: Diagnosis not present

## 2014-08-31 DIAGNOSIS — Z87440 Personal history of urinary (tract) infections: Secondary | ICD-10-CM | POA: Diagnosis not present

## 2014-08-31 DIAGNOSIS — L539 Erythematous condition, unspecified: Secondary | ICD-10-CM | POA: Insufficient documentation

## 2014-08-31 DIAGNOSIS — Z7982 Long term (current) use of aspirin: Secondary | ICD-10-CM | POA: Insufficient documentation

## 2014-08-31 DIAGNOSIS — K219 Gastro-esophageal reflux disease without esophagitis: Secondary | ICD-10-CM | POA: Diagnosis not present

## 2014-08-31 DIAGNOSIS — L0390 Cellulitis, unspecified: Secondary | ICD-10-CM

## 2014-08-31 DIAGNOSIS — L02415 Cutaneous abscess of right lower limb: Secondary | ICD-10-CM | POA: Insufficient documentation

## 2014-08-31 DIAGNOSIS — L03314 Cellulitis of groin: Secondary | ICD-10-CM | POA: Diagnosis not present

## 2014-08-31 DIAGNOSIS — L02214 Cutaneous abscess of groin: Secondary | ICD-10-CM | POA: Diagnosis not present

## 2014-08-31 MED ORDER — CEPHALEXIN 500 MG PO CAPS
500.0000 mg | ORAL_CAPSULE | Freq: Once | ORAL | Status: AC
  Administered 2014-08-31: 500 mg via ORAL
  Filled 2014-08-31: qty 1

## 2014-08-31 MED ORDER — TRAMADOL HCL 50 MG PO TABS
50.0000 mg | ORAL_TABLET | Freq: Once | ORAL | Status: AC
  Administered 2014-08-31: 50 mg via ORAL
  Filled 2014-08-31: qty 1

## 2014-08-31 MED ORDER — TRAMADOL HCL 50 MG PO TABS: 50.0000 mg | ORAL_TABLET | Freq: Two times a day (BID) | ORAL | Status: DC | PRN

## 2014-08-31 MED ORDER — CEPHALEXIN 500 MG PO CAPS: 500.0000 mg | ORAL_CAPSULE | Freq: Three times a day (TID) | ORAL | Status: AC

## 2014-08-31 NOTE — ED Notes (Signed)
Abscess noted to right groin area with clear drainage that started this morning.

## 2014-08-31 NOTE — ED Notes (Signed)
MD at bedside. 

## 2014-08-31 NOTE — ED Provider Notes (Signed)
CSN: [id number]     Arrival date & time 08/31/14  0703 History   First MD Initiated Contact with Patient 08/31/14 [phone]     Chief Complaint  Patient presents with  . Abscess     (Consider location/radiation/quality/duration/timing/severity/associated sxs/prior Treatment) Patient is a 74 y.o. female presenting with abscess.  Abscess Location:  Leg Leg abscess location:  R upper leg Abscess quality: draining, induration, painful, redness and warmth   Red streaking: no   Duration:  4 days Progression:  Improving Pain details:    Quality:  Pressure and sharp   Severity:  Mild   Timing:  Constant Chronicity:  New Context: not diabetes, not immunosuppression, not insect bite/sting and not skin injury   Relieved by:  None tried Worsened by:  Nothing tried Ineffective treatments:  None tried Associated symptoms: no fever and no vomiting     Past Medical History  Diagnosis Date  . Diabetes mellitus   . Hypertension   . UTI (lower urinary tract infection)   . Scoliosis   . GERD (gastroesophageal reflux disease)   . Scoliosis    Past Surgical History  Procedure Laterality Date  . Eye surgery      Retina Detachment  . 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 Boil Left     boil removed from left cheek  . Mass excision N/A 06/27/2014    Procedure: EXCISION SUBMANDIBULAR STONE;  Surgeon: Izora Gala, MD;  Location: [facility];  Service: ENT;  Laterality: N/A;   No family history on file. Social History  Substance Use Topics  . Smoking status: Never Smoker   . Smokeless tobacco: None  . Alcohol Use: No   OB History    No data available     Review of Systems  Constitutional: Negative for fever and chills.  Gastrointestinal: Negative for vomiting and abdominal pain.  Skin: Positive for rash and wound.  All other systems reviewed and are negative.     Allergies  Review of patient's allergies indicates no known allergies.  Home Medications   Prior to Admission medications    Medication Sig Start Date End Date Taking? Authorizing Provider  aspirin 325 MG tablet Take 325 mg by mouth daily.    Historical Provider, MD  cephALEXin (KEFLEX) 500 MG capsule Take 1 capsule (500 mg total) by mouth 3 (three) times daily. 08/31/14 09/07/14  Merrily Pew, MD  cloNIDine (CATAPRES) 0.1 MG tablet Take 0.1 mg by mouth 2 (two) times daily. For blood pressure    Historical Provider, MD  diltiazem (CARDIZEM CD) 360 MG 24 hr capsule Take 360 mg by mouth daily.    Historical Provider, MD  furosemide (LASIX) 20 MG tablet Take 20 mg by mouth 2 (two) times a week. Take on Monday and Friday    Historical Provider, MD  GLUMETZA 1000 MG 24 hr tablet Take 2,000 mg by mouth daily. 06/01/14   Historical Provider, MD  HUMALOG MIX 50/50 KWIKPEN (50-50) 100 UNIT/ML Kwikpen Take 32-43 Units by mouth 2 (two) times daily with breakfast and lunch. 32 Units at Supper 43 Units in Am 06/01/14   Historical Provider, MD  hydrALAZINE (APRESOLINE) 10 MG tablet Take 10 mg by mouth 3 (three) times daily.    Historical Provider, MD  HYDROcodone-acetaminophen (NORCO) 7.5-325 MG per tablet Take 1 tablet by mouth every 6 (six) hours as needed for moderate pain. 06/27/14   Izora Gala, MD  ibuprofen (ADVIL,MOTRIN) 600 MG tablet Take 1 tablet (600 mg total) by mouth  every 6 (six) hours as needed. Patient taking differently: Take 600 mg by mouth every 6 (six) hours as needed for mild pain or moderate pain.  06/23/14   Tammy Triplett, PA-C  olopatadine (PATANOL) 0.1 % ophthalmic solution Place 1 drop into both eyes 2 (two) times daily.    Historical Provider, MD  omeprazole (PRILOSEC) 20 MG capsule Take 20 mg by mouth daily. For acid reflux    Historical Provider, MD  potassium chloride SA (K-DUR,KLOR-CON) 20 MEQ tablet Take 20 mEq by mouth daily.    Historical Provider, MD  promethazine (PHENERGAN) 25 MG suppository Place 1 suppository (25 mg total) rectally every 6 (six) hours as needed for nausea or vomiting. 06/27/14   Izora Gala, MD  risedronate (ACTONEL) 150 MG tablet Take 150 mg by mouth every 30 (thirty) days. with water on empty stomach, nothing by mouth or lie down for next 30 minutes.    Historical Provider, MD  simvastatin (ZOCOR) 5 MG tablet Take 5 mg by mouth at bedtime. 06/01/14   Historical Provider, MD  traMADol (ULTRAM) 50 MG tablet Take 1 tablet (50 mg total) by mouth every 12 (twelve) hours as needed for severe pain. 08/31/14   Merrily Pew, MD   BP 151/88 mmHg  Pulse 82  Temp(Src) 98.3 F (36.8 C) (Oral)  Resp 18  Ht 5\' 6"  (1.676 m)  Wt 162 lb (73.483 kg)  BMI 26.16 kg/m2  SpO2 96% Physical Exam  Constitutional: She is oriented to person, place, and time. She appears well-developed and well-nourished.  HENT:  Head: Normocephalic and atraumatic.  Eyes: Conjunctivae and EOM are normal. Right eye exhibits no discharge. Left eye exhibits no discharge.  Cardiovascular: Normal rate and regular rhythm.   Pulmonary/Chest: Effort normal and breath sounds normal. No respiratory distress.  Abdominal: Soft. She exhibits no distension. There is no tenderness. There is no rebound.  Musculoskeletal: Normal range of motion. She exhibits no edema or tenderness.  Neurological: She is alert and oriented to person, place, and time.  Skin: Skin is warm and dry.  3 x 4 cm oval of erythema on right upper thigh with central 2cm area of induration with draining bloody purulent fluid.   Nursing note and vitals reviewed.   ED Course  Procedures (including critical care time) Labs Review Labs Reviewed - No data to display  Imaging Review No results found. I have personally reviewed and evaluated these images and lab results as part of my medical decision-making.   EKG Interpretation None      MDM   Final diagnoses:  Abscess and cellulitis   Abscess that is already draining with decent 0.75 cm hole. Some surrounding cellulitis for which I will treat. No e/o systemic toxicity. Will dc on antibiotics and  have follow up with PCP in 3 days to ensure improvement.   I have personally and contemperaneously reviewed labs and imaging and used in my decision making as above.   A medical screening exam was performed and I feel the patient has had an appropriate workup for their chief complaint at this time and likelihood of emergent condition existing is low. They have been counseled on decision, discharge, follow up and which symptoms necessitate immediate return to the emergency department. They or their family verbally stated understanding and agreement with plan and discharged in stable condition.      Merrily Pew, MD 08/31/14 [phone]

## 2014-08-31 NOTE — ED Notes (Signed)
Pt states boil to right upper leg, first noticed a week ago. States area began draining this morning.

## 2014-09-03 DIAGNOSIS — E118 Type 2 diabetes mellitus with unspecified complications: Secondary | ICD-10-CM | POA: Diagnosis not present

## 2014-09-03 DIAGNOSIS — L02214 Cutaneous abscess of groin: Secondary | ICD-10-CM | POA: Diagnosis not present

## 2014-09-17 DIAGNOSIS — E1165 Type 2 diabetes mellitus with hyperglycemia: Secondary | ICD-10-CM | POA: Diagnosis not present

## 2014-09-17 DIAGNOSIS — L0293 Carbuncle, unspecified: Secondary | ICD-10-CM | POA: Diagnosis not present

## 2014-10-08 ENCOUNTER — Other Ambulatory Visit (HOSPITAL_COMMUNITY): Payer: Self-pay | Admitting: Family Medicine

## 2014-10-08 DIAGNOSIS — Z1231 Encounter for screening mammogram for malignant neoplasm of breast: Secondary | ICD-10-CM

## 2014-10-12 ENCOUNTER — Encounter (HOSPITAL_COMMUNITY): Payer: Self-pay | Admitting: Emergency Medicine

## 2014-10-12 ENCOUNTER — Emergency Department (HOSPITAL_COMMUNITY): Payer: Medicare Other

## 2014-10-12 ENCOUNTER — Observation Stay (HOSPITAL_COMMUNITY)
Admission: EM | Admit: 2014-10-12 | Discharge: 2014-10-13 | Disposition: A | Discharge status: Home / Self Care | Payer: Medicare Other | Attending: Internal Medicine | Admitting: Internal Medicine

## 2014-10-12 DIAGNOSIS — R42 Dizziness and giddiness: Secondary | ICD-10-CM | POA: Diagnosis not present

## 2014-10-12 DIAGNOSIS — I10 Essential (primary) hypertension: Secondary | ICD-10-CM | POA: Diagnosis not present

## 2014-10-12 DIAGNOSIS — E119 Type 2 diabetes mellitus without complications: Secondary | ICD-10-CM

## 2014-10-12 DIAGNOSIS — S0990XA Unspecified injury of head, initial encounter: Secondary | ICD-10-CM | POA: Diagnosis not present

## 2014-10-12 DIAGNOSIS — R404 Transient alteration of awareness: Secondary | ICD-10-CM | POA: Diagnosis not present

## 2014-10-12 DIAGNOSIS — Z7982 Long term (current) use of aspirin: Secondary | ICD-10-CM | POA: Diagnosis not present

## 2014-10-12 DIAGNOSIS — N183 Chronic kidney disease, stage 3 unspecified: Secondary | ICD-10-CM

## 2014-10-12 DIAGNOSIS — E876 Hypokalemia: Secondary | ICD-10-CM

## 2014-10-12 DIAGNOSIS — Z79899 Other long term (current) drug therapy: Secondary | ICD-10-CM | POA: Diagnosis not present

## 2014-10-12 DIAGNOSIS — R55 Syncope and collapse: Principal | ICD-10-CM

## 2014-10-12 DIAGNOSIS — N390 Urinary tract infection, site not specified: Secondary | ICD-10-CM | POA: Insufficient documentation

## 2014-10-12 DIAGNOSIS — M419 Scoliosis, unspecified: Secondary | ICD-10-CM | POA: Diagnosis not present

## 2014-10-12 DIAGNOSIS — M79603 Pain in arm, unspecified: Secondary | ICD-10-CM | POA: Diagnosis not present

## 2014-10-12 DIAGNOSIS — S0993XA Unspecified injury of face, initial encounter: Secondary | ICD-10-CM | POA: Diagnosis not present

## 2014-10-12 DIAGNOSIS — K219 Gastro-esophageal reflux disease without esophagitis: Secondary | ICD-10-CM | POA: Insufficient documentation

## 2014-10-12 DIAGNOSIS — Z794 Long term (current) use of insulin: Secondary | ICD-10-CM | POA: Diagnosis not present

## 2014-10-12 DIAGNOSIS — M25521 Pain in right elbow: Secondary | ICD-10-CM | POA: Diagnosis not present

## 2014-10-12 DIAGNOSIS — S199XXA Unspecified injury of neck, initial encounter: Secondary | ICD-10-CM | POA: Diagnosis not present

## 2014-10-12 LAB — CBC WITH DIFFERENTIAL/PLATELET
Basophils Absolute: 0 10*3/uL (ref 0.0–0.1)
Basophils Relative: 0 %
Eosinophils Absolute: 0.2 10*3/uL (ref 0.0–0.7)
Eosinophils Relative: 2 %
HEMATOCRIT: 41.7 % (ref 36.0–46.0)
Hemoglobin: 13.7 g/dL (ref 12.0–15.0)
LYMPHS PCT: 20 %
Lymphs Abs: 1.6 10*3/uL (ref 0.7–4.0)
MCH: 27 pg (ref 26.0–34.0)
MCHC: 32.9 g/dL (ref 30.0–36.0)
MCV: 82.2 fL (ref 78.0–100.0)
MONO ABS: 0.6 10*3/uL (ref 0.1–1.0)
MONOS PCT: 8 %
NEUTROS ABS: 5.7 10*3/uL (ref 1.7–7.7)
Neutrophils Relative %: 70 %
Platelets: 254 10*3/uL (ref 150–400)
RBC: 5.07 MIL/uL (ref 3.87–5.11)
RDW: 13.6 % (ref 11.5–15.5)
WBC: 8.2 10*3/uL (ref 4.0–10.5)

## 2014-10-12 LAB — URINE MICROSCOPIC-ADD ON

## 2014-10-12 LAB — URINALYSIS, ROUTINE W REFLEX MICROSCOPIC
Bilirubin Urine: NEGATIVE
Glucose, UA: NEGATIVE mg/dL
Ketones, ur: NEGATIVE mg/dL
Leukocytes, UA: NEGATIVE
Nitrite: NEGATIVE
Protein, ur: 100 mg/dL — AB
SPECIFIC GRAVITY, URINE: 1.02 (ref 1.005–1.030)
UROBILINOGEN UA: 1 mg/dL (ref 0.0–1.0)
pH: 6.5 (ref 5.0–8.0)

## 2014-10-12 LAB — COMPREHENSIVE METABOLIC PANEL
ALBUMIN: 3.5 g/dL (ref 3.5–5.0)
ALK PHOS: 77 U/L (ref 38–126)
ALT: 10 U/L — AB (ref 14–54)
AST: 22 U/L (ref 15–41)
Anion gap: 10 (ref 5–15)
BUN: 20 mg/dL (ref 6–20)
CALCIUM: 8.9 mg/dL (ref 8.9–10.3)
CO2: 28 mmol/L (ref 22–32)
CREATININE: 1.32 mg/dL — AB (ref 0.44–1.00)
Chloride: 98 mmol/L — ABNORMAL LOW (ref 101–111)
GFR calc non Af Amer: 39 mL/min — ABNORMAL LOW (ref >60)
GFR, EST AFRICAN AMERICAN: 45 mL/min — AB (ref >60)
GLUCOSE: 191 mg/dL — AB (ref 65–99)
Potassium: 2.9 mmol/L — ABNORMAL LOW (ref 3.5–5.1)
SODIUM: 136 mmol/L (ref 135–145)
Total Bilirubin: 0.4 mg/dL (ref 0.3–1.2)
Total Protein: 7.6 g/dL (ref 6.5–8.1)

## 2014-10-12 LAB — GLUCOSE, CAPILLARY: Glucose-Capillary: 222 mg/dL — ABNORMAL HIGH (ref 65–99)

## 2014-10-12 LAB — TROPONIN I: Troponin I: <0.03 ng/mL (ref <0.031)

## 2014-10-12 LAB — LACTIC ACID, PLASMA
Lactic Acid, Venous: 3 mmol/L (ref 0.5–2.0)
Lactic Acid, Venous: 2 mmol/L (ref 0.5–2.0)

## 2014-10-12 LAB — CBG MONITORING, ED: GLUCOSE-CAPILLARY: 145 mg/dL — AB (ref 65–99)

## 2014-10-12 MED ORDER — OLOPATADINE HCL 0.1 % OP SOLN
1.0000 [drp] | Freq: Two times a day (BID) | OPHTHALMIC | Status: DC
  Administered 2014-10-13: 1 [drp] via OPHTHALMIC
  Filled 2014-10-12: qty 5

## 2014-10-12 MED ORDER — INSULIN ASPART 100 UNIT/ML ~~LOC~~ SOLN
0.0000 [IU] | Freq: Three times a day (TID) | SUBCUTANEOUS | Status: DC
  Administered 2014-10-12: 2 [IU] via SUBCUTANEOUS
  Administered 2014-10-13 (×2): 5 [IU] via SUBCUTANEOUS

## 2014-10-12 MED ORDER — METOCLOPRAMIDE HCL 10 MG PO TABS
10.0000 mg | ORAL_TABLET | Freq: Four times a day (QID) | ORAL | Status: DC
  Administered 2014-10-12 – 2014-10-13 (×4): 10 mg via ORAL
  Filled 2014-10-12 (×4): qty 1

## 2014-10-12 MED ORDER — INSULIN ASPART 100 UNIT/ML ~~LOC~~ SOLN
0.0000 [IU] | Freq: Every day | SUBCUTANEOUS | Status: DC
  Administered 2014-10-12: 2 [IU] via SUBCUTANEOUS

## 2014-10-12 MED ORDER — METFORMIN HCL ER 750 MG PO TB24
2000.0000 mg | ORAL_TABLET | Freq: Every day | ORAL | Status: DC
  Filled 2014-10-12 (×3): qty 1

## 2014-10-12 MED ORDER — SODIUM CHLORIDE 0.9 % IV BOLUS (SEPSIS)
500.0000 mL | Freq: Once | INTRAVENOUS | Status: AC
  Administered 2014-10-12: 500 mL via INTRAVENOUS

## 2014-10-12 MED ORDER — CLONIDINE HCL 0.1 MG PO TABS
0.1000 mg | ORAL_TABLET | Freq: Two times a day (BID) | ORAL | Status: DC
  Administered 2014-10-12 – 2014-10-13 (×2): 0.1 mg via ORAL
  Filled 2014-10-12 (×2): qty 1

## 2014-10-12 MED ORDER — ASPIRIN EC 81 MG PO TBEC
81.0000 mg | DELAYED_RELEASE_TABLET | Freq: Every day | ORAL | Status: DC
  Administered 2014-10-13: 81 mg via ORAL
  Filled 2014-10-12: qty 1

## 2014-10-12 MED ORDER — ENOXAPARIN SODIUM 40 MG/0.4ML ~~LOC~~ SOLN
40.0000 mg | SUBCUTANEOUS | Status: DC
  Administered 2014-10-12: 40 mg via SUBCUTANEOUS
  Filled 2014-10-12: qty 0.4

## 2014-10-12 MED ORDER — FUROSEMIDE 20 MG PO TABS: 20.0000 mg | ORAL_TABLET | ORAL | Status: DC

## 2014-10-12 MED ORDER — INSULIN GLARGINE 100 UNIT/ML ~~LOC~~ SOLN
20.0000 [IU] | Freq: Every day | SUBCUTANEOUS | Status: DC
  Administered 2014-10-12: 20 [IU] via SUBCUTANEOUS
  Filled 2014-10-12 (×3): qty 0.2

## 2014-10-12 MED ORDER — ASPIRIN EC 81 MG PO TBEC: 81.0000 mg | DELAYED_RELEASE_TABLET | Freq: Every day | ORAL | Status: DC

## 2014-10-12 MED ORDER — POTASSIUM CHLORIDE CRYS ER 20 MEQ PO TBCR
40.0000 meq | EXTENDED_RELEASE_TABLET | Freq: Once | ORAL | Status: AC
  Administered 2014-10-12: 40 meq via ORAL
  Filled 2014-10-12: qty 2

## 2014-10-12 MED ORDER — LISINOPRIL-HYDROCHLOROTHIAZIDE 20-12.5 MG PO TABS: 1.0000 | ORAL_TABLET | Freq: Every day | ORAL | Status: DC

## 2014-10-12 MED ORDER — SODIUM CHLORIDE 0.9 % IV SOLN
INTRAVENOUS | Status: DC
  Administered 2014-10-12 – 2014-10-13 (×2): via INTRAVENOUS

## 2014-10-12 MED ORDER — SIMVASTATIN 10 MG PO TABS
5.0000 mg | ORAL_TABLET | Freq: Every day | ORAL | Status: DC
  Administered 2014-10-12: 5 mg via ORAL
  Filled 2014-10-12: qty 1

## 2014-10-12 MED ORDER — ONDANSETRON HCL 4 MG PO TABS: 4.0000 mg | ORAL_TABLET | Freq: Four times a day (QID) | ORAL | Status: DC | PRN

## 2014-10-12 MED ORDER — DILTIAZEM HCL ER COATED BEADS 180 MG PO CP24
360.0000 mg | ORAL_CAPSULE | Freq: Every day | ORAL | Status: DC
  Administered 2014-10-13: 360 mg via ORAL
  Filled 2014-10-12 (×2): qty 1
  Filled 2014-10-12: qty 2
  Filled 2014-10-12: qty 1

## 2014-10-12 MED ORDER — LISINOPRIL 10 MG PO TABS
20.0000 mg | ORAL_TABLET | Freq: Every day | ORAL | Status: DC
  Administered 2014-10-13: 20 mg via ORAL
  Filled 2014-10-12: qty 2

## 2014-10-12 MED ORDER — HYDROCHLOROTHIAZIDE 12.5 MG PO CAPS
12.5000 mg | ORAL_CAPSULE | Freq: Every day | ORAL | Status: DC
  Administered 2014-10-13: 12.5 mg via ORAL
  Filled 2014-10-12: qty 1

## 2014-10-12 MED ORDER — ACETAMINOPHEN-CODEINE #3 300-30 MG PO TABS
1.0000 | ORAL_TABLET | ORAL | Status: DC | PRN
  Administered 2014-10-13 (×2): 1 via ORAL
  Filled 2014-10-12 (×2): qty 1

## 2014-10-12 MED ORDER — SODIUM CHLORIDE 0.9 % IJ SOLN
3.0000 mL | Freq: Two times a day (BID) | INTRAMUSCULAR | Status: DC
  Administered 2014-10-13: 3 mL via INTRAVENOUS

## 2014-10-12 MED ORDER — PANTOPRAZOLE SODIUM 40 MG PO TBEC
40.0000 mg | DELAYED_RELEASE_TABLET | Freq: Every day | ORAL | Status: DC
  Administered 2014-10-12 – 2014-10-13 (×2): 40 mg via ORAL
  Filled 2014-10-12 (×2): qty 1

## 2014-10-12 MED ORDER — POTASSIUM CHLORIDE CRYS ER 20 MEQ PO TBCR
20.0000 meq | EXTENDED_RELEASE_TABLET | Freq: Every day | ORAL | Status: DC
  Administered 2014-10-12 – 2014-10-13 (×2): 20 meq via ORAL
  Filled 2014-10-12 (×2): qty 1

## 2014-10-12 MED ORDER — ONDANSETRON HCL 4 MG/2ML IJ SOLN: 4.0000 mg | Freq: Four times a day (QID) | INTRAMUSCULAR | Status: DC | PRN

## 2014-10-12 MED ORDER — HYDRALAZINE HCL 10 MG PO TABS
10.0000 mg | ORAL_TABLET | Freq: Three times a day (TID) | ORAL | Status: DC
  Administered 2014-10-13 (×2): 10 mg via ORAL
  Filled 2014-10-12 (×2): qty 1

## 2014-10-12 NOTE — ED Notes (Signed)
Patient arrives via EMS from home with c/o fall. Patient states she felt dizzy prior to fall. States she fell face first. Small abrasion to right nare. Arrives alert/oriented x 4. C/o bilateral arm pain and nasal pain. Denies dizziness at present.

## 2014-10-12 NOTE — ED Notes (Signed)
Pt and family updated on plan of care,  

## 2014-10-12 NOTE — H&P (Signed)
[facility] History and Physical  JUELLE CASSIE [id number] DOB: Dec 24, 1940 DOA: 10/12/2014  Referring physician: ER PCP: Maggie Font, MD   Chief Complaint: Syncope  HPI: [name] is a 74 y.o. female  This is a 74 year old lady, diabetic and hypertensive, who now presents with one episode of syncope that occurred approximately 4 PM today. She was standing up and went around a turn away from her bookcase and she then had loss of consciousness. She had no warning that she was given to positive. She denies any chest pain, palpitations, lightheadedness or dizziness prior to the event or after the event. One of her family members, found her after she had regained consciousness and she states that she was not confused. There was no apparent tongue biting or urinary incontinence. She thinks that she lost consciousness for up to 5 minutes. She has never had this episode before. She is now being admitted for further investigation.   Review of Systems:  Apart from symptoms above, all systems are negative.  Past Medical History  Diagnosis Date  . Diabetes mellitus   . Hypertension   . UTI (lower urinary tract infection)   . Scoliosis   . GERD (gastroesophageal reflux disease)   . Scoliosis    Past Surgical History  Procedure Laterality Date  . Eye surgery      Retina Detachment  . 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 Boil Left     boil removed from left cheek  . Mass excision N/A 06/27/2014    Procedure: EXCISION SUBMANDIBULAR STONE;  Surgeon: Izora Gala, MD;  Location: [facility] OR;  Service: ENT;  Laterality: N/A;   Social History:  reports that she has never smoked. She does not have any smokeless tobacco history on file. She reports that she does not drink alcohol or use illicit drugs.  No Known Allergies   Family history: No history of syncope in the family.  Prior to Admission medications   Medication Sig Start Date End Date Taking? Authorizing Provider  acetaminophen-codeine  (TYLENOL #3) 300-30 MG tablet Take 1 tablet by mouth every 4 (four) hours as needed for moderate pain.   Yes Historical Provider, MD  aspirin EC 81 MG tablet Take 81 mg by mouth daily.   Yes Historical Provider, MD  HUMALOG MIX 50/50 KWIKPEN (50-50) 100 UNIT/ML Kwikpen Take 32-44 Units by mouth 2 (two) times daily with breakfast and lunch. 32 Units at Supper 43 Units in Am 06/01/14  Yes Historical Provider, MD  metoCLOPramide (REGLAN) 10 MG tablet Take 10 mg by mouth 4 (four) times daily. 09/17/14  Yes Historical Provider, MD  olopatadine (PATANOL) 0.1 % ophthalmic solution Place 1 drop into both eyes daily as needed for allergies.    Yes Historical Provider, MD  risedronate (ACTONEL) 150 MG tablet Take 150 mg by mouth every 30 (thirty) days. with water on empty stomach, nothing by mouth or lie down for next 30 minutes.   Yes Historical Provider, MD  cloNIDine (CATAPRES) 0.1 MG tablet Take 0.1 mg by mouth 2 (two) times daily. For blood pressure    Historical Provider, MD  diltiazem (CARDIZEM CD) 360 MG 24 hr capsule Take 360 mg by mouth daily.    Historical Provider, MD  furosemide (LASIX) 20 MG tablet Take 20 mg by mouth 2 (two) times a week. Take on Monday and Friday    Historical Provider, MD  GLUMETZA 1000 MG 24 hr tablet Take 2,000 mg by mouth daily. 06/01/14  Historical Provider, MD  hydrALAZINE (APRESOLINE) 10 MG tablet Take 10 mg by mouth 3 (three) times daily.    Historical Provider, MD  HYDROcodone-acetaminophen (NORCO) 7.5-325 MG per tablet Take 1 tablet by mouth every 6 (six) hours as needed for moderate pain. Patient not taking: Reported on 10/12/2014 06/27/14   Izora Gala, MD  ibuprofen (ADVIL,MOTRIN) 600 MG tablet Take 1 tablet (600 mg total) by mouth every 6 (six) hours as needed. Patient not taking: Reported on 10/12/2014 06/23/14   Tammy Triplett, PA-C  lisinopril-hydrochlorothiazide (PRINZIDE,ZESTORETIC) 20-12.5 MG tablet Take 1 tablet by mouth daily. 09/19/14   Historical Provider, MD    omeprazole (PRILOSEC) 20 MG capsule Take 20 mg by mouth daily. For acid reflux    Historical Provider, MD  potassium chloride SA (K-DUR,KLOR-CON) 20 MEQ tablet Take 20 mEq by mouth daily.    Historical Provider, MD  promethazine (PHENERGAN) 25 MG suppository Place 1 suppository (25 mg total) rectally every 6 (six) hours as needed for nausea or vomiting. Patient not taking: Reported on 10/12/2014 06/27/14   Izora Gala, MD  simvastatin (ZOCOR) 5 MG tablet Take 5 mg by mouth at bedtime. 06/01/14   Historical Provider, MD  traMADol (ULTRAM) 50 MG tablet Take 1 tablet (50 mg total) by mouth every 12 (twelve) hours as needed for severe pain. Patient not taking: Reported on 10/12/2014 08/31/14   Merrily Pew, MD   Physical Exam: Filed Vitals:   10/12/14 1730 10/12/14 1800 10/12/14 2030 10/12/14 2044  BP: 150/86 161/90 177/105   Pulse: 85 80 75   Temp:    98.4 F (36.9 C)  TempSrc:      Resp: 19 18 16    Height:      Weight:      SpO2: 97% 98% 99%     Wt Readings from Last 3 Encounters:  10/12/14 71.668 kg (158 lb)  08/31/14 73.483 kg (162 lb)  06/27/14 73.936 kg (163 lb)    General:  Appears calm and comfortable. She is alert and orientated. Eyes: PERRL, normal lids, irises & conjunctiva ENT: grossly normal hearing, lips & tongue Neck: no LAD, masses or thyromegaly Cardiovascular: RRR, no m/r/g. No LE edema. Telemetry: SR, no arrhythmias  Respiratory: CTA bilaterally, no w/r/r. Normal respiratory effort. Abdomen: soft, ntnd Skin: no rash or induration seen on limited exam Musculoskeletal: grossly normal tone BUE/BLE Psychiatric: grossly normal mood and affect, speech fluent and appropriate Neurologic: grossly non-focal.          Labs on Admission:  Basic Metabolic Panel:  Recent Labs Lab 10/12/14 1750  NA 136  K 2.9*  CL 98*  CO2 28  GLUCOSE 191*  BUN 20  CREATININE 1.32*  CALCIUM 8.9   Liver Function Tests:  Recent Labs Lab 10/12/14 1750  AST 22  ALT 10*   ALKPHOS 77  BILITOT 0.4  PROT 7.6  ALBUMIN 3.5   No results for input(s): LIPASE, AMYLASE in the last 168 hours. No results for input(s): AMMONIA in the last 168 hours. CBC:  Recent Labs Lab 10/12/14 1750  WBC 8.2  NEUTROABS 5.7  HGB 13.7  HCT 41.7  MCV 82.2  PLT 254   Cardiac Enzymes:  Recent Labs Lab 10/12/14 1750  TROPONINI <0.03    BNP (last 3 results) No results for input(s): BNP in the last 8760 hours.  ProBNP (last 3 results) No results for input(s): PROBNP in the last 8760 hours.  CBG: No results for input(s): GLUCAP in the last 168 hours.  Radiological  Exams on Admission: Dg Chest 2 View  10/12/2014   CLINICAL DATA:  Dizziness, fall.  EXAM: CHEST  2 VIEW  COMPARISON:  November 13, 2005.  FINDINGS: The heart size and mediastinal contours are within normal limits. Both lungs are clear. No pneumothorax or pleural effusion is noted. The visualized skeletal structures are unremarkable.  IMPRESSION: No active cardiopulmonary disease.   Electronically Signed   By: Marijo Conception, M.D.   On: 10/12/2014 19:12   Dg Elbow Complete Left  10/12/2014   CLINICAL DATA:  Fall  EXAM: LEFT ELBOW - COMPLETE 3+ VIEW  COMPARISON:  None.  FINDINGS: No acute fracture.  No dislocation.  No joint effusion.  IMPRESSION: No acute bony pathology.   Electronically Signed   By: Marybelle Killings M.D.   On: 10/12/2014 19:13   Dg Elbow Complete Right  10/12/2014   CLINICAL DATA:  Right elbow pain after fall.  EXAM: RIGHT ELBOW - COMPLETE 3+ VIEW  COMPARISON:  None.  FINDINGS: There is no evidence of fracture, dislocation, or joint effusion. There is no evidence of arthropathy or other focal bone abnormality. Soft tissues are unremarkable.  IMPRESSION: Normal right elbow.   Electronically Signed   By: Marijo Conception, M.D.   On: 10/12/2014 19:15   Ct Head Wo Contrast  10/12/2014   CLINICAL DATA:  Fall face first.  EXAM: CT HEAD WITHOUT CONTRAST  CT MAXILLOFACIAL WITHOUT CONTRAST  CT CERVICAL SPINE  WITHOUT CONTRAST  TECHNIQUE: Multidetector CT imaging of the head, cervical spine, and maxillofacial structures were performed using the standard protocol without intravenous contrast. Multiplanar CT image reconstructions of the cervical spine and maxillofacial structures were also generated.  COMPARISON:  02/15/2007  FINDINGS: CT HEAD FINDINGS  Chronic ischemic changes in the periventricular white matter. Mild global atrophy appropriate to age. 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 No mass effect, midline shift, or acute hemorrhage. Mastoid air cells are clear. Cranium is intact.  CT MAXILLOFACIAL FINDINGS  No evidence of facial bone fracture. No obvious soft tissue injury. The right orbital lens has an irregular appearance of unknown significance. Patent airway.  CT CERVICAL SPINE FINDINGS  No acute fracture. No dislocation. Anatomic alignment. Severe disc space narrowing with endplate changes occurs at C5-6. The thyroid gland is heterogeneous. An ill-defined right thyroid nodule may be present.  IMPRESSION: No acute intracranial pathology  No acute facial bone injury. The right orbital lens has any irregular appearance. Correlation with physical exam is recommended.  No evidence of cervical spine injury. Degenerative changes are noted at the C5-C6 disc. Right thyroid nodule may be present. Thyroid ultrasound is recommended.   Electronically Signed   By: Marybelle Killings M.D.   On: 10/12/2014 18:56   Ct Cervical Spine Wo Contrast  10/12/2014   CLINICAL DATA:  Fall face first.  EXAM: CT HEAD WITHOUT CONTRAST  CT MAXILLOFACIAL WITHOUT CONTRAST  CT CERVICAL SPINE WITHOUT CONTRAST  TECHNIQUE: Multidetector CT imaging of the head, cervical spine, and maxillofacial structures were performed using the standard protocol without intravenous contrast. Multiplanar CT image reconstructions of the cervical spine and maxillofacial structures were also generated.  COMPARISON:  02/15/2007  FINDINGS: CT HEAD  FINDINGS  Chronic ischemic changes in the periventricular white matter. Mild global atrophy appropriate to age. 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 No mass effect, midline shift, or acute hemorrhage. Mastoid air cells are clear. Cranium is intact.  CT MAXILLOFACIAL FINDINGS  No evidence of facial bone  fracture. No obvious soft tissue injury. The right orbital lens has an irregular appearance of unknown significance. Patent airway.  CT CERVICAL SPINE FINDINGS  No acute fracture. No dislocation. Anatomic alignment. Severe disc space narrowing with endplate changes occurs at C5-6. The thyroid gland is heterogeneous. An ill-defined right thyroid nodule may be present.  IMPRESSION: No acute intracranial pathology  No acute facial bone injury. The right orbital lens has any irregular appearance. Correlation with physical exam is recommended.  No evidence of cervical spine injury. Degenerative changes are noted at the C5-C6 disc. Right thyroid nodule may be present. Thyroid ultrasound is recommended.   Electronically Signed   By: Marybelle Killings M.D.   On: 10/12/2014 18:56   Ct Maxillofacial Wo Cm  10/12/2014   CLINICAL DATA:  Fall face first.  EXAM: CT HEAD WITHOUT CONTRAST  CT MAXILLOFACIAL WITHOUT CONTRAST  CT CERVICAL SPINE WITHOUT CONTRAST  TECHNIQUE: Multidetector CT imaging of the head, cervical spine, and maxillofacial structures were performed using the standard protocol without intravenous contrast. Multiplanar CT image reconstructions of the cervical spine and maxillofacial structures were also generated.  COMPARISON:  02/15/2007  FINDINGS: CT HEAD FINDINGS  Chronic ischemic changes in the periventricular white matter. Mild global atrophy appropriate to age. 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 No mass effect, midline shift, or acute hemorrhage. Mastoid air cells are clear. Cranium is intact.  CT MAXILLOFACIAL FINDINGS  No evidence of facial bone fracture. No obvious  soft tissue injury. The right orbital lens has an irregular appearance of unknown significance. Patent airway.  CT CERVICAL SPINE FINDINGS  No acute fracture. No dislocation. Anatomic alignment. Severe disc space narrowing with endplate changes occurs at C5-6. The thyroid gland is heterogeneous. An ill-defined right thyroid nodule may be present.  IMPRESSION: No acute intracranial pathology  No acute facial bone injury. The right orbital lens has any irregular appearance. Correlation with physical exam is recommended.  No evidence of cervical spine injury. Degenerative changes are noted at the C5-C6 disc. Right thyroid nodule may be present. Thyroid ultrasound is recommended.   Electronically Signed   By: Marybelle Killings M.D.   On: 10/12/2014 18:56    EKG: Independently reviewed. Sinus rhythm, no acute ST-T wave changes.  Assessment/Plan   1. Syncope. The etiology is not clear to me whatsoever. Her creatinine is slightly elevated and this may be hypovolemia. She will be treated with IV fluids. Serial troponin levels. Bilateral carotid Dopplers. Echocardiogram. 2. Hypokalemia. This will be repleted. Monitor electrolytes closely. 3. Renal insufficiency. This may be secondary to hypovolemia or may be chronic. Monitor renal function closely. 4. Diabetes. Continue with metformin and sliding scale of insulin. 5. Hypertension. Monitor closely.  She will be admitted under observation in telemetry. Further recommendations will depend on patient's hospital progress.  Code Status: Full code.   DVT Prophylaxis: Lovenox.  Family Communication: I discussed the plan with the patient at the bedside.   Disposition Plan: Home when medically stable, possibly tomorrow.   Time spent: 45 minutes.  Doree Albee [facility] Pager [phone].

## 2014-10-12 NOTE — ED Provider Notes (Signed)
CSN: [id number]     Arrival date & time 10/12/14  1639 History   First MD Initiated Contact with Patient 10/12/14 1702     Chief Complaint  Patient presents with  . Fall  . Loss of Consciousness      HPI Pt was seen at 1710. Per pt, c/o sudden onset and resolution of one episode of syncope that occurred PTA. Pt states she was standing at home and "just felt weak and passed out." Pt states she woke up on the floor, crawled to the couch and stood up. Pt states her face and bilat "crooks of my arms" hurt. Denies hx of syncope. Denies focal motor weakness, no tingling/numbness in extremities, no CP/palpitations, no SOB/cough, no abd pain, no N/V/D.    Past Medical History  Diagnosis Date  . Diabetes mellitus   . Hypertension   . UTI (lower urinary tract infection)   . Scoliosis   . GERD (gastroesophageal reflux disease)   . Scoliosis    Past Surgical History  Procedure Laterality Date  . Eye surgery      Retina Detachment  . 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 Boil Left     boil removed from left cheek  . Mass excision N/A 06/27/2014    Procedure: EXCISION SUBMANDIBULAR STONE;  Surgeon: Izora Gala, MD;  Location: [facility];  Service: ENT;  Laterality: N/A;   No family history on file. Social History  Substance Use Topics  . Smoking status: Never Smoker   . Smokeless tobacco: None  . Alcohol Use: No    Review of Systems ROS: Statement: All systems negative except as marked or noted in the HPI; Constitutional: Negative for fever and chills. ; ; Eyes: Negative for eye pain, redness and discharge. ; ; ENMT: Negative for ear pain, hoarseness, nasal congestion, sinus pressure and sore throat. ; ; Cardiovascular: Negative for chest pain, palpitations, diaphoresis, dyspnea and peripheral edema. ; ; Respiratory: Negative for cough, wheezing and stridor. ; ; Gastrointestinal: Negative for nausea, vomiting, diarrhea, abdominal pain, blood in stool, hematemesis, jaundice and rectal bleeding. . ; ;  Genitourinary: Negative for dysuria, flank pain and hematuria. ; ; Musculoskeletal: Negative for back pain and neck pain. Negative for swelling and deformity.; ; Skin: +abrasion. Negative for pruritus, rash, blisters, bruising and skin lesion.; ; Neuro: Negative for headache, lightheadedness and neck stiffness. Negative for extremity weakness, paresthesias, involuntary movement, seizure and +syncope.     Allergies  Review of patient's allergies indicates no known allergies.  Home Medications   Prior to Admission medications   Medication Sig Start Date End Date Taking? Authorizing Provider  acetaminophen-codeine (TYLENOL #3) 300-30 MG tablet Take 1 tablet by mouth every 4 (four) hours as needed for moderate pain.   Yes Historical Provider, MD  aspirin EC 81 MG tablet Take 81 mg by mouth daily.   Yes Historical Provider, MD  HUMALOG MIX 50/50 KWIKPEN (50-50) 100 UNIT/ML Kwikpen Take 32-44 Units by mouth 2 (two) times daily with breakfast and lunch. 32 Units at Supper 43 Units in Am 06/01/14  Yes Historical Provider, MD  metoCLOPramide (REGLAN) 10 MG tablet Take 10 mg by mouth 4 (four) times daily. 09/17/14  Yes Historical Provider, MD  olopatadine (PATANOL) 0.1 % ophthalmic solution Place 1 drop into both eyes daily as needed for allergies.    Yes Historical Provider, MD  risedronate (ACTONEL) 150 MG tablet Take 150 mg by mouth every 30 (thirty) days. with water on empty stomach, nothing by  mouth or lie down for next 30 minutes.   Yes Historical Provider, MD  cloNIDine (CATAPRES) 0.1 MG tablet Take 0.1 mg by mouth 2 (two) times daily. For blood pressure    Historical Provider, MD  diltiazem (CARDIZEM CD) 360 MG 24 hr capsule Take 360 mg by mouth daily.    Historical Provider, MD  furosemide (LASIX) 20 MG tablet Take 20 mg by mouth 2 (two) times a week. Take on Monday and Friday    Historical Provider, MD  GLUMETZA 1000 MG 24 hr tablet Take 2,000 mg by mouth daily. 06/01/14   Historical Provider, MD   hydrALAZINE (APRESOLINE) 10 MG tablet Take 10 mg by mouth 3 (three) times daily.    Historical Provider, MD  HYDROcodone-acetaminophen (NORCO) 7.5-325 MG per tablet Take 1 tablet by mouth every 6 (six) hours as needed for moderate pain. Patient not taking: Reported on 10/12/2014 06/27/14   Izora Gala, MD  ibuprofen (ADVIL,MOTRIN) 600 MG tablet Take 1 tablet (600 mg total) by mouth every 6 (six) hours as needed. Patient not taking: Reported on 10/12/2014 06/23/14   Tammy Triplett, PA-C  lisinopril-hydrochlorothiazide (PRINZIDE,ZESTORETIC) 20-12.5 MG tablet Take 1 tablet by mouth daily. 09/19/14   Historical Provider, MD  omeprazole (PRILOSEC) 20 MG capsule Take 20 mg by mouth daily. For acid reflux    Historical Provider, MD  potassium chloride SA (K-DUR,KLOR-CON) 20 MEQ tablet Take 20 mEq by mouth daily.    Historical Provider, MD  promethazine (PHENERGAN) 25 MG suppository Place 1 suppository (25 mg total) rectally every 6 (six) hours as needed for nausea or vomiting. Patient not taking: Reported on 10/12/2014 06/27/14   Izora Gala, MD  simvastatin (ZOCOR) 5 MG tablet Take 5 mg by mouth at bedtime. 06/01/14   Historical Provider, MD  traMADol (ULTRAM) 50 MG tablet Take 1 tablet (50 mg total) by mouth every 12 (twelve) hours as needed for severe pain. Patient not taking: Reported on 10/12/2014 08/31/14   Merrily Pew, MD   BP 184/113 mmHg  Pulse 92  Temp(Src) 98.3 F (36.8 C) (Oral)  Resp 18  Ht 5\' 6"  (1.676 m)  Wt 158 lb (71.668 kg)  BMI 25.51 kg/m2  SpO2 97%   18:23 Orthostatic Vital Signs SO  Orthostatic Lying  - BP- Lying: 161/90 mmHg ; Pulse- Lying: 86  Orthostatic Sitting - BP- Sitting: 172/101 mmHg ; Pulse- Sitting: 92  Orthostatic Standing at 0 minutes - BP- Standing at 0 minutes: 171/101 mmHg ; Pulse- Standing at 0 minutes: 94       Physical Exam  1715: Physical examination: Vital signs and O2 SAT: Reviewed; Constitutional: Well developed, Well nourished, Well hydrated, In no  acute distress; Head and Face: Normocephalic, No scalp hematomas, no lacs.  Non-tender to palp superior and inferior orbital rim areas.  No zygoma tenderness.  No mandibular tenderness.; Eyes: EOMI, PERRL, No scleral icterus; ENMT: Mouth and pharynx normal, Left TM normal, Right TM normal, Mucous membranes moist, +teeth and tongue intact.  No intraoral or intranasal bleeding.  No septal hematomas.  No trismus, no malocclusion. +small superficial abrasion to nose.;;  Neck: Supple, Full range of motion, No lymphadenopathy; Cardiovascular: Regular rate and rhythm, No gallop; Respiratory: Breath sounds clear & equal bilaterally, No wheezes.  Speaking full sentences with ease, Normal respiratory effort/excursion; Chest: Nontender, Movement normal; Abdomen: Soft, Nontender, Nondistended, Normal bowel sounds; Genitourinary: No CVA tenderness; Spine:  No midline CS, TS, LS tenderness.;; Extremities: Pulses normal, No deformity. +mild tenderness to bilat AC areas. No  olecranon tenderness bilat. NT bilat shoulders/forearms/wrists/hands. Muscles compartments soft. No edema, No calf edema or asymmetry.; Neuro: AA&Ox3, Major CN grossly intact. No facial droop.  Speech clear. Grips equal. Strength 5/5 equal bilat UE's and LE's. Moves all extremities spontaneously and to command without apparent gross focal motor deficits.; Skin: Color normal, Warm, Dry.   ED Course  Procedures (including critical care time) Labs Review   Imaging Review  I have personally reviewed and evaluated these images and lab results as part of my medical decision-making.   EKG Interpretation   Date/Time:  Friday October 12 2014 16:50:28 EDT Ventricular Rate:  90 PR Interval:  190 QRS Duration: 87 QT Interval:  382 QTC Calculation: 467 R Axis:   31 Text Interpretation:  Sinus rhythm Low voltage, precordial leads Baseline  wander When compared with ECG of 06/27/2014 No significant change was found  Confirmed by [facility]  MD, Nunzio Cory  [phone]) on 10/12/2014 6:04:00 PM      MDM  MDM Reviewed: previous chart, nursing note and vitals Reviewed previous: labs and ECG Interpretation: labs, ECG, x-ray and CT scan     Results for orders placed or performed during the hospital encounter of 10/12/14  Urinalysis, Routine w reflex microscopic  Result Value Ref Range   Color, Urine YELLOW YELLOW   APPearance CLEAR CLEAR   Specific Gravity, Urine 1.020 1.005 - 1.030   pH 6.5 5.0 - 8.0   Glucose, UA NEGATIVE NEGATIVE mg/dL   Hgb urine dipstick TRACE (A) NEGATIVE   Bilirubin Urine NEGATIVE NEGATIVE   Ketones, ur NEGATIVE NEGATIVE mg/dL   Protein, ur 100 (A) NEGATIVE mg/dL   Urobilinogen, UA 1.0 0.0 - 1.0 mg/dL   Nitrite NEGATIVE NEGATIVE   Leukocytes, UA NEGATIVE NEGATIVE  Comprehensive metabolic panel  Result Value Ref Range   Sodium 136 135 - 145 mmol/L   Potassium 2.9 (L) 3.5 - 5.1 mmol/L   Chloride 98 (L) 101 - 111 mmol/L   CO2 28 22 - 32 mmol/L   Glucose, Bld 191 (H) 65 - 99 mg/dL   BUN 20 6 - 20 mg/dL   Creatinine, Ser 1.32 (H) 0.44 - 1.00 mg/dL   Calcium 8.9 8.9 - 10.3 mg/dL   Total Protein 7.6 6.5 - 8.1 g/dL   Albumin 3.5 3.5 - 5.0 g/dL   AST 22 15 - 41 U/L   ALT 10 (L) 14 - 54 U/L   Alkaline Phosphatase 77 38 - 126 U/L   Total Bilirubin 0.4 0.3 - 1.2 mg/dL   GFR calc non Af Amer 39 (L) >60 mL/min   GFR calc Af Amer 45 (L) >60 mL/min   Anion gap 10 5 - 15  Troponin I  Result Value Ref Range   Troponin I <0.03 <0.031 ng/mL  Lactic acid, plasma  Result Value Ref Range   Lactic Acid, Venous 3.0 (HH) 0.5 - 2.0 mmol/L  CBC with Differential  Result Value Ref Range   WBC 8.2 4.0 - 10.5 K/uL   RBC 5.07 3.87 - 5.11 MIL/uL   Hemoglobin 13.7 12.0 - 15.0 g/dL   HCT 41.7 36.0 - 46.0 %   MCV 82.2 78.0 - 100.0 fL   MCH 27.0 26.0 - 34.0 pg   MCHC 32.9 30.0 - 36.0 g/dL   RDW 13.6 11.5 - 15.5 %   Platelets 254 150 - 400 K/uL   Neutrophils Relative % 70 %   Neutro Abs 5.7 1.7 - 7.7 K/uL   Lymphocytes Relative  20 %  Lymphs Abs 1.6 0.7 - 4.0 K/uL   Monocytes Relative 8 %   Monocytes Absolute 0.6 0.1 - 1.0 K/uL   Eosinophils Relative 2 %   Eosinophils Absolute 0.2 0.0 - 0.7 K/uL   Basophils Relative 0 %   Basophils Absolute 0.0 0.0 - 0.1 K/uL  Urine microscopic-add on  Result Value Ref Range   Squamous Epithelial / LPF FEW (A) RARE   WBC, UA 3-6 <3 WBC/hpf   RBC / HPF 0-2 <3 RBC/hpf   Bacteria, UA FEW (A) RARE   Urine-Other MANY YEAST    Dg Chest 2 View 10/12/2014   CLINICAL DATA:  Dizziness, fall.  EXAM: CHEST  2 VIEW  COMPARISON:  November 13, 2005.  FINDINGS: The heart size and mediastinal contours are within normal limits. Both lungs are clear. No pneumothorax or pleural effusion is noted. The visualized skeletal structures are unremarkable.  IMPRESSION: No active cardiopulmonary disease.   Electronically Signed   By: Marijo Conception, M.D.   On: 10/12/2014 19:12   Dg Elbow Complete Left 10/12/2014   CLINICAL DATA:  Fall  EXAM: LEFT ELBOW - COMPLETE 3+ VIEW  COMPARISON:  None.  FINDINGS: No acute fracture.  No dislocation.  No joint effusion.  IMPRESSION: No acute bony pathology.   Electronically Signed   By: Marybelle Killings M.D.   On: 10/12/2014 19:13   Dg Elbow Complete Right 10/12/2014   CLINICAL DATA:  Right elbow pain after fall.  EXAM: RIGHT ELBOW - COMPLETE 3+ VIEW  COMPARISON:  None.  FINDINGS: There is no evidence of fracture, dislocation, or joint effusion. There is no evidence of arthropathy or other focal bone abnormality. Soft tissues are unremarkable.  IMPRESSION: Normal right elbow.   Electronically Signed   By: Marijo Conception, M.D.   On: 10/12/2014 19:15   Ct Head Wo Contrast 10/12/2014   CLINICAL DATA:  Fall face first.  EXAM: CT HEAD WITHOUT CONTRAST  CT MAXILLOFACIAL WITHOUT CONTRAST  CT CERVICAL SPINE WITHOUT CONTRAST  TECHNIQUE: Multidetector CT imaging of the head, cervical spine, and maxillofacial structures were performed using the standard protocol without intravenous  contrast. Multiplanar CT image reconstructions of the cervical spine and maxillofacial structures were also generated.  COMPARISON:  02/15/2007  FINDINGS: CT HEAD FINDINGS  Chronic ischemic changes in the periventricular white matter. Mild global atrophy appropriate to age. 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 No mass effect, midline shift, or acute hemorrhage. Mastoid air cells are clear. Cranium is intact.  CT MAXILLOFACIAL FINDINGS  No evidence of facial bone fracture. No obvious soft tissue injury. The right orbital lens has an irregular appearance of unknown significance. Patent airway.  CT CERVICAL SPINE FINDINGS  No acute fracture. No dislocation. Anatomic alignment. Severe disc space narrowing with endplate changes occurs at C5-6. The thyroid gland is heterogeneous. An ill-defined right thyroid nodule may be present.  IMPRESSION: No acute intracranial pathology  No acute facial bone injury. The right orbital lens has any irregular appearance. Correlation with physical exam is recommended.  No evidence of cervical spine injury. Degenerative changes are noted at the C5-C6 disc. Right thyroid nodule may be present. Thyroid ultrasound is recommended.   Electronically Signed   By: Marybelle Killings M.D.   On: 10/12/2014 18:56   Ct Cervical Spine Wo Contrast 10/12/2014   CLINICAL DATA:  Fall face first.  EXAM: CT HEAD WITHOUT CONTRAST  CT MAXILLOFACIAL WITHOUT CONTRAST  CT CERVICAL SPINE WITHOUT CONTRAST  TECHNIQUE: Multidetector CT  imaging of the head, cervical spine, and maxillofacial structures were performed using the standard protocol without intravenous contrast. Multiplanar CT image reconstructions of the cervical spine and maxillofacial structures were also generated.  COMPARISON:  02/15/2007  FINDINGS: CT HEAD FINDINGS  Chronic ischemic changes in the periventricular white matter. Mild global atrophy appropriate to age. 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 No mass  effect, midline shift, or acute hemorrhage. Mastoid air cells are clear. Cranium is intact.  CT MAXILLOFACIAL FINDINGS  No evidence of facial bone fracture. No obvious soft tissue injury. The right orbital lens has an irregular appearance of unknown significance. Patent airway.  CT CERVICAL SPINE FINDINGS  No acute fracture. No dislocation. Anatomic alignment. Severe disc space narrowing with endplate changes occurs at C5-6. The thyroid gland is heterogeneous. An ill-defined right thyroid nodule may be present.  IMPRESSION: No acute intracranial pathology  No acute facial bone injury. The right orbital lens has any irregular appearance. Correlation with physical exam is recommended.  No evidence of cervical spine injury. Degenerative changes are noted at the C5-C6 disc. Right thyroid nodule may be present. Thyroid ultrasound is recommended.   Electronically Signed   By: Marybelle Killings M.D.   On: 10/12/2014 18:56   Ct Maxillofacial Wo Cm 10/12/2014   CLINICAL DATA:  Fall face first.  EXAM: CT HEAD WITHOUT CONTRAST  CT MAXILLOFACIAL WITHOUT CONTRAST  CT CERVICAL SPINE WITHOUT CONTRAST  TECHNIQUE: Multidetector CT imaging of the head, cervical spine, and maxillofacial structures were performed using the standard protocol without intravenous contrast. Multiplanar CT image reconstructions of the cervical spine and maxillofacial structures were also generated.  COMPARISON:  02/15/2007  FINDINGS: CT HEAD FINDINGS  Chronic ischemic changes in the periventricular white matter. Mild global atrophy appropriate to age. 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 No mass effect, midline shift, or acute hemorrhage. Mastoid air cells are clear. Cranium is intact.  CT MAXILLOFACIAL FINDINGS  No evidence of facial bone fracture. No obvious soft tissue injury. The right orbital lens has an irregular appearance of unknown significance. Patent airway.  CT CERVICAL SPINE FINDINGS  No acute fracture. No dislocation.  Anatomic alignment. Severe disc space narrowing with endplate changes occurs at C5-6. The thyroid gland is heterogeneous. An ill-defined right thyroid nodule may be present.  IMPRESSION: No acute intracranial pathology  No acute facial bone injury. The right orbital lens has any irregular appearance. Correlation with physical exam is recommended.  No evidence of cervical spine injury. Degenerative changes are noted at the C5-C6 disc. Right thyroid nodule may be present. Thyroid ultrasound is recommended.   Electronically Signed   By: Marybelle Killings M.D.   On: 10/12/2014 18:56    2010:  Potassium repleted PO. BUN/Cr per baseline. Judicious IVF given for elevated lactic acid; will recheck. No fever while in the ED and WBC count normal, Udip and CXR without signs of infection; doubt sepsis at this time. Dx and testing d/w pt and family.  Questions answered.  Verb understanding, agreeable to admit. T/C to [facility]. Anastasio Champion, case discussed, including:  HPI, pertinent PM/SHx, VS/PE, dx testing, ED course and treatment:  Agreeable to admit, requests to write temporary orders, obtain tele bed to team [facility].   Francine Graven, DO 10/17/14 1519

## 2014-10-12 NOTE — ED Notes (Signed)
Pt given frozen meal, cbg 145,

## 2014-10-12 NOTE — Plan of Care (Signed)
Problem: Risk for Falls Goal: Safety/Fall Risk information discussed Outcome: Not Progressing Pt with verbalized understandings of fall prevention measures present to protect patient from fall.  Pt with verbalized agreement to call for help prior to OOB.

## 2014-10-12 NOTE — ED Notes (Signed)
Critical Lactic Acid called to Dr Thurnell Garbe

## 2014-10-12 NOTE — ED Notes (Signed)
Report given to Marion RN on 300 

## 2014-10-13 ENCOUNTER — Observation Stay (HOSPITAL_BASED_OUTPATIENT_CLINIC_OR_DEPARTMENT_OTHER): Payer: Medicare Other

## 2014-10-13 ENCOUNTER — Observation Stay (HOSPITAL_COMMUNITY): Payer: Medicare Other

## 2014-10-13 DIAGNOSIS — R55 Syncope and collapse: Secondary | ICD-10-CM

## 2014-10-13 DIAGNOSIS — N183 Chronic kidney disease, stage 3 (moderate): Secondary | ICD-10-CM

## 2014-10-13 DIAGNOSIS — I6523 Occlusion and stenosis of bilateral carotid arteries: Secondary | ICD-10-CM | POA: Diagnosis not present

## 2014-10-13 DIAGNOSIS — I10 Essential (primary) hypertension: Secondary | ICD-10-CM

## 2014-10-13 DIAGNOSIS — E119 Type 2 diabetes mellitus without complications: Secondary | ICD-10-CM

## 2014-10-13 DIAGNOSIS — Z794 Long term (current) use of insulin: Secondary | ICD-10-CM

## 2014-10-13 LAB — COMPREHENSIVE METABOLIC PANEL
ALK PHOS: 66 U/L (ref 38–126)
ALT: 11 U/L — ABNORMAL LOW (ref 14–54)
ANION GAP: 6 (ref 5–15)
AST: 15 U/L (ref 15–41)
Albumin: 3.1 g/dL — ABNORMAL LOW (ref 3.5–5.0)
BUN: 18 mg/dL (ref 6–20)
CALCIUM: 8.5 mg/dL — AB (ref 8.9–10.3)
CO2: 28 mmol/L (ref 22–32)
Chloride: 102 mmol/L (ref 101–111)
Creatinine, Ser: 1.25 mg/dL — ABNORMAL HIGH (ref 0.44–1.00)
GFR calc non Af Amer: 42 mL/min — ABNORMAL LOW (ref >60)
GFR, EST AFRICAN AMERICAN: 48 mL/min — AB (ref >60)
Glucose, Bld: 223 mg/dL — ABNORMAL HIGH (ref 65–99)
POTASSIUM: 3.5 mmol/L (ref 3.5–5.1)
Sodium: 136 mmol/L (ref 135–145)
TOTAL PROTEIN: 6.5 g/dL (ref 6.5–8.1)
Total Bilirubin: 0.5 mg/dL (ref 0.3–1.2)

## 2014-10-13 LAB — TROPONIN I: Troponin I: <0.03 ng/mL (ref <0.031)

## 2014-10-13 LAB — GLUCOSE, CAPILLARY
Glucose-Capillary: 225 mg/dL — ABNORMAL HIGH (ref 65–99)
Glucose-Capillary: 235 mg/dL — ABNORMAL HIGH (ref 65–99)

## 2014-10-13 LAB — CBC
HEMATOCRIT: 38.4 % (ref 36.0–46.0)
HEMOGLOBIN: 12.4 g/dL (ref 12.0–15.0)
MCH: 26.7 pg (ref 26.0–34.0)
MCHC: 32.3 g/dL (ref 30.0–36.0)
MCV: 82.8 fL (ref 78.0–100.0)
Platelets: 252 10*3/uL (ref 150–400)
RBC: 4.64 MIL/uL (ref 3.87–5.11)
RDW: 13.7 % (ref 11.5–15.5)
WBC: 7.1 10*3/uL (ref 4.0–10.5)

## 2014-10-13 MED ORDER — HYDRALAZINE HCL 25 MG PO TABS
25.0000 mg | ORAL_TABLET | Freq: Three times a day (TID) | ORAL | Status: DC
  Administered 2014-10-13: 25 mg via ORAL
  Filled 2014-10-13: qty 1

## 2014-10-13 MED ORDER — LISINOPRIL 20 MG PO TABS: 20.0000 mg | ORAL_TABLET | Freq: Every day | ORAL | Status: DC

## 2014-10-13 MED ORDER — METOPROLOL TARTRATE 25 MG PO TABS
12.5000 mg | ORAL_TABLET | Freq: Two times a day (BID) | ORAL | Status: DC
Start: 2014-10-13 — End: 2021-02-17

## 2014-10-13 MED ORDER — METOPROLOL TARTRATE 25 MG PO TABS
25.0000 mg | ORAL_TABLET | Freq: Two times a day (BID) | ORAL | Status: DC
  Administered 2014-10-13: 25 mg via ORAL
  Filled 2014-10-13: qty 1

## 2014-10-13 MED ORDER — METFORMIN HCL ER 500 MG PO TB24
2000.0000 mg | ORAL_TABLET | Freq: Every day | ORAL | Status: DC
  Administered 2014-10-13: 2000 mg via ORAL
  Filled 2014-10-13: qty 4

## 2014-10-13 NOTE — Discharge Summary (Signed)
Physician Discharge Summary  [name] [id number] DOB: [birth year] DOA: 10/12/2014  PCP: Maggie Font, MD  Admit date: 10/12/2014 Discharge date: 10/13/2014  Time spent: 35 minutes  Recommendations for Outpatient Follow-up:  1. Follow up with PCP in on 10/11 as scheduled 2. Patient will be referred to cardiology for consideration of possible event monitor 3. Consider thyroid ultrasound for further evaluation of right thyroid nodule  Discharge Diagnoses:  Active Problems:   Syncope   Diabetes ([place])   Hypertension Hypokalemia CKD stage III  Discharge Condition: Improved  Diet recommendation: Heart healthy  Filed Weights   10/12/14 1641 10/12/14 2158  Weight: 71.668 kg (158 lb) 70.353 kg (155 lb 1.6 oz)    History of present illness:  74 year old female with a hx of DM and HTN presented s/p a syncopal episode. Patient was standing when she suddenly woke up on the floor. She denies any warning signs that she was going to pass out. While in the ED, imaging was unremarkable. Labs consistent with dehydration, lactate 3.0. Patient hypertensive but vital signs otherwise stable. Will admit for further management.   Hospital Course:  74 y/o female presented s/p a syncopal episode. Syncope with unclear etiology. CT head, cervical spine, maxillofacial bones, CXR and bilateral elbow xrays negative. Serial troponins negative and EKG did not show any acute findings. Bilateral carotid dopplers unremarkable. ECHO did not show any significant findings. Labs revealed hypokalemia and evidence of CKD stage III. She may have had an element of dehydration. Patient feeling significantly improved after IVF, denies any recurrent dizziness. UA was also unremarkable. She may benefit from an outpatient evaluation from cardiology to be considered for an event monitor.   1. DM type 2, stable. Patient was noted to have elevated lactic acid on admission. Will discontinue Metformin until she follows up with  her PCP. Continue insulin.  2. Essential Hypertension. Will add metoprolol to her medication regimen. Hold Lasix and HCTZ in the setting of possible dehydration.   Procedures:  ECHO Left ventricle: The cavity size was normal. There was moderate concentric hypertrophy. Systolic function was vigorous. The estimated ejection fraction was in the range of 65% to 70%. Wall motion was normal; there were no regional wall motion abnormalities. Doppler parameters are consistent with abnormal left ventricular relaxation (grade 1 diastolic dysfunction). Doppler parameters are consistent with elevated ventricular end-diastolic filling pressure. - Aortic valve: Trileaflet; normal thickness leaflets. There was no regurgitation. - Aortic root: The aortic root was normal in size. - Mitral valve: Structurally normal valve. There was trivial regurgitation. - Left atrium: The atrium was normal in size. - Right ventricle: Systolic function was normal. - Right atrium: The atrium was normal in size. - Tricuspid valve: There was mild regurgitation. - Pulmonic valve: There was no regurgitation. - Pulmonary arteries: Systolic pressure was within the normal range. - Inferior vena cava: The vessel was normal in size. - Pericardium, extracardiac: There was no pericardial effusion.  Consultations:  none  Discharge Exam: Filed Vitals:   10/13/14 1502  BP: 127/66  Pulse: 63  Temp: 98 F (36.7 C)  Resp: 18     General: NAD, looks comfortable  Cardiovascular: RRR, S1, S2   Respiratory: clear bilaterally, No wheezing, rales or rhonchi  Abdomen: soft, non tender, no distention , bowel sounds normal  Musculoskeletal: No edema b/l  Discharge Instructions   Discharge Instructions    Diet - low sodium heart healthy    Complete by:  As directed      Increase  activity slowly    Complete by:  As directed           Current Discharge Medication List    START taking these  medications   Details  lisinopril (PRINIVIL,ZESTRIL) 20 MG tablet Take 1 tablet (20 mg total) by mouth daily. Qty: 30 tablet, Refills: 0    metoprolol tartrate (LOPRESSOR) 25 MG tablet Take 0.5 tablets (12.5 mg total) by mouth 2 (two) times daily. Qty: 30 tablet, Refills: 1      CONTINUE these medications which have NOT CHANGED   Details  acetaminophen-codeine (TYLENOL #3) 300-30 MG tablet Take 1 tablet by mouth every 4 (four) hours as needed for moderate pain.    aspirin EC 81 MG tablet Take 81 mg by mouth daily.    HUMALOG MIX 50/50 KWIKPEN (50-50) 100 UNIT/ML Kwikpen Take 32-44 Units by mouth 2 (two) times daily with breakfast and lunch. 32 Units at Supper 43 Units in Am    metoCLOPramide (REGLAN) 10 MG tablet Take 10 mg by mouth 4 (four) times daily.    olopatadine (PATANOL) 0.1 % ophthalmic solution Place 1 drop into both eyes daily as needed for allergies.     risedronate (ACTONEL) 150 MG tablet Take 150 mg by mouth every 30 (thirty) days. with water on empty stomach, nothing by mouth or lie down for next 30 minutes.    cloNIDine (CATAPRES) 0.1 MG tablet Take 0.1 mg by mouth 2 (two) times daily. For blood pressure    diltiazem (CARDIZEM CD) 360 MG 24 hr capsule Take 360 mg by mouth daily.    hydrALAZINE (APRESOLINE) 10 MG tablet Take 10 mg by mouth 3 (three) times daily.    HYDROcodone-acetaminophen (NORCO) 7.5-325 MG per tablet Take 1 tablet by mouth every 6 (six) hours as needed for moderate pain. Qty: 16 tablet, Refills: 0    omeprazole (PRILOSEC) 20 MG capsule Take 20 mg by mouth daily. For acid reflux    potassium chloride SA (K-DUR,KLOR-CON) 20 MEQ tablet Take 20 mEq by mouth daily.    promethazine (PHENERGAN) 25 MG suppository Place 1 suppository (25 mg total) rectally every 6 (six) hours as needed for nausea or vomiting. Qty: 12 suppository, Refills: 1    simvastatin (ZOCOR) 5 MG tablet Take 5 mg by mouth at bedtime.    traMADol (ULTRAM) 50 MG tablet Take 1  tablet (50 mg total) by mouth every 12 (twelve) hours as needed for severe pain. Qty: 5 tablet, Refills: 0      STOP taking these medications     furosemide (LASIX) 20 MG tablet      GLUMETZA 1000 MG 24 hr tablet      ibuprofen (ADVIL,MOTRIN) 600 MG tablet      lisinopril-hydrochlorothiazide (PRINZIDE,ZESTORETIC) 20-12.5 MG tablet        No Known Allergies Follow-up Information    Follow up with Maggie Font, MD.   Specialty:  Family Medicine   Why:  on tuesday as scheduled   Contact information:   [address] [place]  [address] [phone]        The results of significant diagnostics from this hospitalization (including imaging, microbiology, ancillary and laboratory) are listed below for reference.    Significant Diagnostic Studies: Dg Chest 2 View  10/12/2014   CLINICAL DATA:  Dizziness, fall.  EXAM: CHEST  2 VIEW  COMPARISON:  November 13, 2005.  FINDINGS: The heart size and mediastinal contours are within normal limits. Both lungs are clear. No pneumothorax or  pleural effusion is noted. The visualized skeletal structures are unremarkable.  IMPRESSION: No active cardiopulmonary disease.   Electronically Signed   By: Marijo Conception, M.D.   On: 10/12/2014 19:12   Dg Elbow Complete Left  10/12/2014   CLINICAL DATA:  Fall  EXAM: LEFT ELBOW - COMPLETE 3+ VIEW  COMPARISON:  None.  FINDINGS: No acute fracture.  No dislocation.  No joint effusion.  IMPRESSION: No acute bony pathology.   Electronically Signed   By: Marybelle Killings M.D.   On: 10/12/2014 19:13   Dg Elbow Complete Right  10/12/2014   CLINICAL DATA:  Right elbow pain after fall.  EXAM: RIGHT ELBOW - COMPLETE 3+ VIEW  COMPARISON:  None.  FINDINGS: There is no evidence of fracture, dislocation, or joint effusion. There is no evidence of arthropathy or other focal bone abnormality. Soft tissues are unremarkable.  IMPRESSION: Normal right elbow.   Electronically Signed   By: Marijo Conception, M.D.   On: 10/12/2014  19:15   Ct Head Wo Contrast  10/12/2014   CLINICAL DATA:  Fall face first.  EXAM: CT HEAD WITHOUT CONTRAST  CT MAXILLOFACIAL WITHOUT CONTRAST  CT CERVICAL SPINE WITHOUT CONTRAST  TECHNIQUE: Multidetector CT imaging of the head, cervical spine, and maxillofacial structures were performed using the standard protocol without intravenous contrast. Multiplanar CT image reconstructions of the cervical spine and maxillofacial structures were also generated.  COMPARISON:  02/15/2007  FINDINGS: CT HEAD FINDINGS  Chronic ischemic changes in the periventricular white matter. Mild global atrophy appropriate to age. 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 No mass effect, midline shift, or acute hemorrhage. Mastoid air cells are clear. Cranium is intact.  CT MAXILLOFACIAL FINDINGS  No evidence of facial bone fracture. No obvious soft tissue injury. The right orbital lens has an irregular appearance of unknown significance. Patent airway.  CT CERVICAL SPINE FINDINGS  No acute fracture. No dislocation. Anatomic alignment. Severe disc space narrowing with endplate changes occurs at C5-6. The thyroid gland is heterogeneous. An ill-defined right thyroid nodule may be present.  IMPRESSION: No acute intracranial pathology  No acute facial bone injury. The right orbital lens has any irregular appearance. Correlation with physical exam is recommended.  No evidence of cervical spine injury. Degenerative changes are noted at the C5-C6 disc. Right thyroid nodule may be present. Thyroid ultrasound is recommended.   Electronically Signed   By: Marybelle Killings M.D.   On: 10/12/2014 18:56   Ct Cervical Spine Wo Contrast  10/12/2014   CLINICAL DATA:  Fall face first.  EXAM: CT HEAD WITHOUT CONTRAST  CT MAXILLOFACIAL WITHOUT CONTRAST  CT CERVICAL SPINE WITHOUT CONTRAST  TECHNIQUE: Multidetector CT imaging of the head, cervical spine, and maxillofacial structures were performed using the standard protocol without intravenous  contrast. Multiplanar CT image reconstructions of the cervical spine and maxillofacial structures were also generated.  COMPARISON:  02/15/2007  FINDINGS: CT HEAD FINDINGS  Chronic ischemic changes in the periventricular white matter. Mild global atrophy appropriate to age. 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 No mass effect, midline shift, or acute hemorrhage. Mastoid air cells are clear. Cranium is intact.  CT MAXILLOFACIAL FINDINGS  No evidence of facial bone fracture. No obvious soft tissue injury. The right orbital lens has an irregular appearance of unknown significance. Patent airway.  CT CERVICAL SPINE FINDINGS  No acute fracture. No dislocation. Anatomic alignment. Severe disc space narrowing with endplate changes occurs at C5-6. The thyroid gland is heterogeneous.  An ill-defined right thyroid nodule may be present.  IMPRESSION: No acute intracranial pathology  No acute facial bone injury. The right orbital lens has any irregular appearance. Correlation with physical exam is recommended.  No evidence of cervical spine injury. Degenerative changes are noted at the C5-C6 disc. Right thyroid nodule may be present. Thyroid ultrasound is recommended.   Electronically Signed   By: Marybelle Killings M.D.   On: 10/12/2014 18:56   US Carotid Bilateral  10/13/2014   CLINICAL DATA:  Syncope, hypertension, hyperlipidemia and diabetes.  EXAM: BILATERAL CAROTID DUPLEX ULTRASOUND  TECHNIQUE: Pearline Cables scale imaging, color Doppler and duplex ultrasound were performed of bilateral carotid and vertebral arteries in the neck.  COMPARISON:  None.  FINDINGS: Criteria: Quantification of carotid stenosis is based on velocity parameters that correlate the residual internal carotid diameter with NASCET-based stenosis levels, using the diameter of the distal internal carotid lumen as the denominator for stenosis measurement.  The following velocity measurements were obtained:  RIGHT  ICA:  41/12 cm/sec  CCA:  [id number]  cm/sec  SYSTOLIC ICA/CCA RATIO:  0.7  DIASTOLIC ICA/CCA RATIO:  1.3  ECA:  68 cm/sec  LEFT  ICA:  58/10 cm/sec  CCA:  [id number] cm/sec  SYSTOLIC ICA/CCA RATIO:  0.5  DIASTOLIC ICA/CCA RATIO:  0.7  ECA:  75 cm/sec  RIGHT CAROTID ARTERY: The common carotid artery is tortuous. There is a small amount of partially calcified plaque at the level of the distal bulb and proximal ICA. ICA velocities and waveforms are normal and estimated right ICA stenosis is less than 50%.  RIGHT VERTEBRAL ARTERY: Antegrade flow with normal waveform and velocity.  LEFT CAROTID ARTERY: There is a mild amount of calcified plaque at the origin of the left ICA. Velocities and waveforms are normal and estimated left ICA stenosis is less than 50%.  LEFT VERTEBRAL ARTERY: Antegrade flow with normal waveform and velocity.  IMPRESSION: Mild amount of plaque in the distal right carotid bulb and both proximal internal carotid arteries. No significant carotid stenosis is identified with estimated bilateral ICA stenoses of less than 50%.   Electronically Signed   By: Aletta Edouard M.D.   On: 10/13/2014 10:34   Ct Maxillofacial Wo Cm  10/12/2014   CLINICAL DATA:  Fall face first.  EXAM: CT HEAD WITHOUT CONTRAST  CT MAXILLOFACIAL WITHOUT CONTRAST  CT CERVICAL SPINE WITHOUT CONTRAST  TECHNIQUE: Multidetector CT imaging of the head, cervical spine, and maxillofacial structures were performed using the standard protocol without intravenous contrast. Multiplanar CT image reconstructions of the cervical spine and maxillofacial structures were also generated.  COMPARISON:  02/15/2007  FINDINGS: CT HEAD FINDINGS  Chronic ischemic changes in the periventricular white matter. Mild global atrophy appropriate to age. 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 No mass effect, midline shift, or acute hemorrhage. Mastoid air cells are clear. Cranium is intact.  CT MAXILLOFACIAL FINDINGS  No evidence of facial bone fracture. No obvious soft tissue  injury. The right orbital lens has an irregular appearance of unknown significance. Patent airway.  CT CERVICAL SPINE FINDINGS  No acute fracture. No dislocation. Anatomic alignment. Severe disc space narrowing with endplate changes occurs at C5-6. The thyroid gland is heterogeneous. An ill-defined right thyroid nodule may be present.  IMPRESSION: No acute intracranial pathology  No acute facial bone injury. The right orbital lens has any irregular appearance. Correlation with physical exam is recommended.  No evidence of cervical spine injury. Degenerative changes are noted at the C5-C6 disc.  Right thyroid nodule may be present. Thyroid ultrasound is recommended.   Electronically Signed   By: Marybelle Killings M.D.   On: 10/12/2014 18:56     Labs: Basic Metabolic Panel:  Recent Labs Lab 10/12/14 1750 10/13/14 0548  NA 136 136  K 2.9* 3.5  CL 98* 102  CO2 28 28  GLUCOSE 191* 223*  BUN 20 18  CREATININE 1.32* 1.25*  CALCIUM 8.9 8.5*   Liver Function Tests:  Recent Labs Lab 10/12/14 1750 10/13/14 0548  AST 22 15  ALT 10* 11*  ALKPHOS 77 66  BILITOT 0.4 0.5  PROT 7.6 6.5  ALBUMIN 3.5 3.1*   CBC:  Recent Labs Lab 10/12/14 1750 10/13/14 0548  WBC 8.2 7.1  NEUTROABS 5.7  --   HGB 13.7 12.4  HCT 41.7 38.4  MCV 82.2 82.8  PLT 254 252   Cardiac Enzymes:  Recent Labs Lab 10/12/14 1750 10/13/14 0003 10/13/14 0548 10/13/14 1141  TROPONINI <0.03 <0.03 <0.03 <0.03   CBG:  Recent Labs Lab 10/12/14 2046 10/12/14 2205 10/13/14 0732 10/13/14 1137  GLUCAP 145* 222* 225* 235*       Signed:  Jolaine Artist Memon. MD  [facility] 10/13/2014, 3:48 PM  By signing my name below, I, Rosalie Doctor, attest that this documentation has been prepared under the direction and in the presence of [facility]. MD Electronically Signed: Rosalie Doctor, Scribe. 10/13/2014 11:20am  I, Dr. Kathie Dike, personally performed the services described in this documentaiton.  All medical record entries made by the scribe were at my direction and in my presence. I have reviewed the chart and agree that the record reflects my personal performance and is accurate and complete  Kathie Dike, MD, 10/13/2014 3:48 PM

## 2014-10-13 NOTE — Progress Notes (Signed)
  Echocardiogram 2D Echocardiogram has been performed.  Lysle Rubens 10/13/2014, 9:51 AM

## 2014-10-13 NOTE — Progress Notes (Signed)
NURSING PROGRESS NOTE  DONI SUNDERLAND ZE:[id number] Discharge Data: 10/13/2014 4:39 PM Attending Provider: Kathie Dike, MD [id number] Raliegh Ip, MD   Freida Busman to be D/C'd Home per MD order.    All IV's discontinued and monitored for bleeding.  All belongings returned to patient for patient to take home.  AVS and prescriptions reviewed with patient and family.  Educated on syncope, metoprolol, and cardiac monitoring.  Patient left floor via wheelchair, escorted by NT.  Last Documented Vital Signs:  Blood pressure 127/66, pulse 63, temperature 98 F (36.7 C), temperature source Oral, resp. rate 18, height 5\' 5"  (1.651 m), weight 70.353 kg (155 lb 1.6 oz), SpO2 97 %.  Cecilie Kicks D

## 2014-10-15 LAB — URINE CULTURE: Culture: >=100000

## 2014-10-16 DIAGNOSIS — I10 Essential (primary) hypertension: Secondary | ICD-10-CM | POA: Diagnosis not present

## 2014-10-16 DIAGNOSIS — R55 Syncope and collapse: Secondary | ICD-10-CM | POA: Diagnosis not present

## 2014-10-16 DIAGNOSIS — Z0000 Encounter for general adult medical examination without abnormal findings: Secondary | ICD-10-CM | POA: Diagnosis not present

## 2014-10-16 DIAGNOSIS — E1122 Type 2 diabetes mellitus with diabetic chronic kidney disease: Secondary | ICD-10-CM | POA: Diagnosis not present

## 2014-10-16 DIAGNOSIS — Z23 Encounter for immunization: Secondary | ICD-10-CM | POA: Diagnosis not present

## 2014-10-26 DIAGNOSIS — B351 Tinea unguium: Secondary | ICD-10-CM | POA: Diagnosis not present

## 2014-10-26 DIAGNOSIS — E1342 Other specified diabetes mellitus with diabetic polyneuropathy: Secondary | ICD-10-CM | POA: Diagnosis not present

## 2014-11-08 ENCOUNTER — Encounter: Payer: Self-pay | Admitting: Cardiovascular Disease

## 2014-11-08 ENCOUNTER — Ambulatory Visit (INDEPENDENT_AMBULATORY_CARE_PROVIDER_SITE_OTHER): Payer: Medicare Other | Admitting: Cardiovascular Disease

## 2014-11-08 VITALS — BP 122/64 | HR 63 | Ht 65.0 in | Wt 157.0 lb

## 2014-11-08 DIAGNOSIS — I779 Disorder of arteries and arterioles, unspecified: Secondary | ICD-10-CM

## 2014-11-08 DIAGNOSIS — E785 Hyperlipidemia, unspecified: Secondary | ICD-10-CM

## 2014-11-08 DIAGNOSIS — I739 Peripheral vascular disease, unspecified: Secondary | ICD-10-CM

## 2014-11-08 DIAGNOSIS — Z87898 Personal history of other specified conditions: Secondary | ICD-10-CM

## 2014-11-08 DIAGNOSIS — R55 Syncope and collapse: Secondary | ICD-10-CM | POA: Diagnosis not present

## 2014-11-08 DIAGNOSIS — I10 Essential (primary) hypertension: Secondary | ICD-10-CM

## 2014-11-08 DIAGNOSIS — Z9289 Personal history of other medical treatment: Secondary | ICD-10-CM

## 2014-11-08 NOTE — Progress Notes (Signed)
Patient ID: [name], female   DOB: [birth year], 74 y.o.   MRN: [id number]       CARDIOLOGY CONSULT NOTE  Patient ID: [name] MRN: [id number] DOB/AGE: [birth year] 74 y.o.  Admit date: (Not on file) Primary Physician Maggie Font, MD  Reason for Consultation: syncope  HPI: The patient is a 74 year old woman with a history of hypertension, hyperlipidemia, and diabetes mellitus was recently hospitalized for syncope. I reviewed all relevant records pertaining to this hospitalization. Troponins were normal. ECG was unremarkable showing normal sinus rhythm. Echocardiogram demonstrated normal left ventricle systolic function, LVEF [id number], moderate concentric LVH with grade 1 diastolic dysfunction. Head CT and carotid Dopplers were unremarkable (<50% stenosis bilaterally). There is some element of dehydration and the patient felt better after receiving IV fluids. There were no arrhythmias on telemetry.  She had been standing and looking at her bookshelf when she suddenly woke up on the floor and did not experience any antecedent symptoms such as chest pain or palpitations, nor dizziness/lightheadedness.  She denies exertional chest pain and dyspnea altogether. She denies prior episodes of syncope.   No Known Allergies  Current Outpatient Prescriptions  Medication Sig Dispense Refill  . acetaminophen-codeine (TYLENOL #3) 300-30 MG tablet Take 1 tablet by mouth every 4 (four) hours as needed for moderate pain.    Marland Kitchen aspirin EC 81 MG tablet Take 81 mg by mouth daily.    . cloNIDine (CATAPRES) 0.1 MG tablet Take 0.1 mg by mouth 2 (two) times daily. For blood pressure    . HUMALOG MIX 50/50 KWIKPEN (50-50) 100 UNIT/ML Kwikpen Take 32-44 Units by mouth 2 (two) times daily with breakfast and lunch. 32 Units at Supper 43 Units in Am    . hydrALAZINE (APRESOLINE) 10 MG tablet Take 10 mg by mouth 3 (three) times daily.    . metoCLOPramide (REGLAN) 10 MG tablet Take 10 mg by mouth 4 (four) times  daily.    . metoprolol tartrate (LOPRESSOR) 25 MG tablet Take 0.5 tablets (12.5 mg total) by mouth 2 (two) times daily. 30 tablet 1  . olopatadine (PATANOL) 0.1 % ophthalmic solution Place 1 drop into both eyes daily as needed for allergies.     Marland Kitchen omeprazole (PRILOSEC) 20 MG capsule Take 20 mg by mouth daily. For acid reflux    . risedronate (ACTONEL) 150 MG tablet Take 150 mg by mouth every 30 (thirty) days. with water on empty stomach, nothing by mouth or lie down for next 30 minutes.    . simvastatin (ZOCOR) 5 MG tablet Take 5 mg by mouth at bedtime.     No current facility-administered medications for this visit.    Past Medical History  Diagnosis Date  . Diabetes mellitus   . Hypertension   . UTI (lower urinary tract infection)   . Scoliosis   . GERD (gastroesophageal reflux disease)   . Scoliosis     Past Surgical History  Procedure Laterality Date  . Eye surgery      Retina Detachment  . 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 Boil Left     boil removed from left cheek  . Mass excision N/A 06/27/2014    Procedure: EXCISION SUBMANDIBULAR STONE;  Surgeon: Izora Gala, MD;  Location: [facility] OR;  Service: ENT;  Laterality: N/A;  . Sublingual gland removal  2016    Social History   Social History  . Marital Status: Divorced    Spouse Name: N/A  . Number of Children: N/A  .  Years of Education: N/A   Occupational History  . Not on file.   Social History Main Topics  . Smoking status: Never Smoker   . Smokeless tobacco: Not on file  . Alcohol Use: No  . Drug Use: No  . Sexual Activity: Yes    Birth Control/ Protection: Post-menopausal   Other Topics Concern  . Not on file   Social History Narrative     No family history of premature CAD in 1st degree relatives.  Prior to Admission medications   Medication Sig Start Date End Date Taking? Authorizing Provider  acetaminophen-codeine (TYLENOL #3) 300-30 MG tablet Take 1 tablet by mouth every 4 (four) hours as needed for  moderate pain.    Historical Provider, MD  aspirin EC 81 MG tablet Take 81 mg by mouth daily.    Historical Provider, MD  cloNIDine (CATAPRES) 0.1 MG tablet Take 0.1 mg by mouth 2 (two) times daily. For blood pressure    Historical Provider, MD  diltiazem (CARDIZEM CD) 360 MG 24 hr capsule Take 360 mg by mouth daily.    Historical Provider, MD  HUMALOG MIX 50/50 KWIKPEN (50-50) 100 UNIT/ML Kwikpen Take 32-44 Units by mouth 2 (two) times daily with breakfast and lunch. 32 Units at Supper 43 Units in Am 06/01/14   Historical Provider, MD  hydrALAZINE (APRESOLINE) 10 MG tablet Take 10 mg by mouth 3 (three) times daily.    Historical Provider, MD  HYDROcodone-acetaminophen (NORCO) 7.5-325 MG per tablet Take 1 tablet by mouth every 6 (six) hours as needed for moderate pain. Patient not taking: Reported on 10/12/2014 06/27/14   Izora Gala, MD  lisinopril (PRINIVIL,ZESTRIL) 20 MG tablet Take 1 tablet (20 mg total) by mouth daily. 10/13/14   Kathie Dike, MD  metoCLOPramide (REGLAN) 10 MG tablet Take 10 mg by mouth 4 (four) times daily. 09/17/14   Historical Provider, MD  metoprolol tartrate (LOPRESSOR) 25 MG tablet Take 0.5 tablets (12.5 mg total) by mouth 2 (two) times daily. 10/13/14   Kathie Dike, MD  olopatadine (PATANOL) 0.1 % ophthalmic solution Place 1 drop into both eyes daily as needed for allergies.     Historical Provider, MD  omeprazole (PRILOSEC) 20 MG capsule Take 20 mg by mouth daily. For acid reflux    Historical Provider, MD  potassium chloride SA (K-DUR,KLOR-CON) 20 MEQ tablet Take 20 mEq by mouth daily.    Historical Provider, MD  promethazine (PHENERGAN) 25 MG suppository Place 1 suppository (25 mg total) rectally every 6 (six) hours as needed for nausea or vomiting. Patient not taking: Reported on 10/12/2014 06/27/14   Izora Gala, MD  risedronate (ACTONEL) 150 MG tablet Take 150 mg by mouth every 30 (thirty) days. with water on empty stomach, nothing by mouth or lie down for next 30  minutes.    Historical Provider, MD  simvastatin (ZOCOR) 5 MG tablet Take 5 mg by mouth at bedtime. 06/01/14   Historical Provider, MD  traMADol (ULTRAM) 50 MG tablet Take 1 tablet (50 mg total) by mouth every 12 (twelve) hours as needed for severe pain. Patient not taking: Reported on 10/12/2014 08/31/14   Merrily Pew, MD     Review of systems complete and found to be negative unless listed above in HPI     Physical exam There were no vitals taken for this visit. General: NAD Neck: No JVD, no thyromegaly or thyroid nodule.  Lungs: Clear to auscultation bilaterally with normal respiratory effort. CV: Nondisplaced PMI. Regular rate and rhythm, normal  S1/S2, no S3/S4, no murmur.  Trace pretibial edema.  No carotid bruit.  Normal pedal pulses.  Abdomen: Soft, nontender, no hepatosplenomegaly, no distention.  Skin: Intact without lesions or rashes.  Neurologic: Alert and oriented x 3.  Psych: Normal affect. Extremities: No clubbing or cyanosis.  HEENT: Normal.   ECG: Most recent ECG reviewed.  Labs:   Lab Results  Component Value Date   WBC 7.1 10/13/2014   HGB 12.4 10/13/2014   HCT 38.4 10/13/2014   MCV 82.8 10/13/2014   PLT 252 10/13/2014   No results for input(s): NA, K, CL, CO2, BUN, CREATININE, CALCIUM, PROT, BILITOT, ALKPHOS, ALT, AST, GLUCOSE in the last 168 hours.  Invalid input(s): LABALBU Lab Results  Component Value Date   TROPONINI <0.03 10/13/2014   No results found for: CHOL No results found for: HDL No results found for: LDLCALC No results found for: TRIG No results found for: CHOLHDL No results found for: LDLDIRECT       Studies: No results found.  ASSESSMENT AND PLAN:  1. Syncope: May have been vasovagal in etiology and precipitated by dehydration. Diuretic has since been stopped and she is now on metoprolol. Will hold off on event monitoring and stress testing for now. If she has symptom recurrence or begins to experience chest pain/palpitations, I  have instructed her to come and see me and additional testing would be considered at that time.  2. Essential HTN: Controlled. No changes.  3. Hyperlipidemia: Continue statin therapy.  4. Carotid artery stenosis: Continue statin therapy. Results noted above.  Dispo: f/u prn.   Signed: Kate Sable, M.D., F.A.C.C.  11/08/2014, 10:15 AM

## 2014-11-08 NOTE — Patient Instructions (Signed)
Your physician recommends that you schedule a follow-up appointment in: only as needed.       Thank you for choosing [facility] !         

## 2014-11-14 ENCOUNTER — Ambulatory Visit (HOSPITAL_COMMUNITY)
Admission: RE | Admit: 2014-11-14 | Discharge: 2014-11-14 | Disposition: A | Discharge status: Home / Self Care | Payer: Medicare Other | Source: Ambulatory Visit | Attending: Family Medicine | Admitting: Family Medicine

## 2014-11-14 DIAGNOSIS — Z1231 Encounter for screening mammogram for malignant neoplasm of breast: Secondary | ICD-10-CM | POA: Diagnosis not present

## 2014-11-17 ENCOUNTER — Other Ambulatory Visit: Payer: Self-pay

## 2014-11-17 ENCOUNTER — Emergency Department (HOSPITAL_COMMUNITY)
Admission: EM | Admit: 2014-11-17 | Discharge: 2014-11-17 | Disposition: A | Discharge status: Home / Self Care | Payer: Medicare Other | Attending: Emergency Medicine | Admitting: Emergency Medicine

## 2014-11-17 ENCOUNTER — Encounter (HOSPITAL_COMMUNITY): Payer: Self-pay | Admitting: Emergency Medicine

## 2014-11-17 ENCOUNTER — Emergency Department (HOSPITAL_COMMUNITY): Payer: Medicare Other

## 2014-11-17 DIAGNOSIS — G4489 Other headache syndrome: Secondary | ICD-10-CM

## 2014-11-17 DIAGNOSIS — E119 Type 2 diabetes mellitus without complications: Secondary | ICD-10-CM | POA: Diagnosis not present

## 2014-11-17 DIAGNOSIS — H6122 Impacted cerumen, left ear: Secondary | ICD-10-CM | POA: Diagnosis not present

## 2014-11-17 DIAGNOSIS — Z87440 Personal history of urinary (tract) infections: Secondary | ICD-10-CM | POA: Insufficient documentation

## 2014-11-17 DIAGNOSIS — K219 Gastro-esophageal reflux disease without esophagitis: Secondary | ICD-10-CM | POA: Diagnosis not present

## 2014-11-17 DIAGNOSIS — M419 Scoliosis, unspecified: Secondary | ICD-10-CM | POA: Diagnosis not present

## 2014-11-17 DIAGNOSIS — R51 Headache: Secondary | ICD-10-CM | POA: Diagnosis not present

## 2014-11-17 DIAGNOSIS — Z7982 Long term (current) use of aspirin: Secondary | ICD-10-CM | POA: Insufficient documentation

## 2014-11-17 DIAGNOSIS — Z794 Long term (current) use of insulin: Secondary | ICD-10-CM | POA: Diagnosis not present

## 2014-11-17 DIAGNOSIS — Z79899 Other long term (current) drug therapy: Secondary | ICD-10-CM | POA: Insufficient documentation

## 2014-11-17 DIAGNOSIS — I10 Essential (primary) hypertension: Secondary | ICD-10-CM

## 2014-11-17 MED ORDER — CLONIDINE HCL 0.1 MG PO TABS
0.1000 mg | ORAL_TABLET | Freq: Two times a day (BID) | ORAL | Status: DC
  Administered 2014-11-17: 0.1 mg via ORAL
  Filled 2014-11-17: qty 1

## 2014-11-17 MED ORDER — METOPROLOL TARTRATE 25 MG PO TABS
12.5000 mg | ORAL_TABLET | Freq: Two times a day (BID) | ORAL | Status: DC
  Administered 2014-11-17: 12.5 mg via ORAL
  Filled 2014-11-17: qty 1

## 2014-11-17 MED ORDER — HYDRALAZINE HCL 10 MG PO TABS
10.0000 mg | ORAL_TABLET | Freq: Three times a day (TID) | ORAL | Status: DC
  Administered 2014-11-17: 10 mg via ORAL
  Filled 2014-11-17 (×7): qty 1

## 2014-11-17 MED ORDER — HYDROCODONE-ACETAMINOPHEN 5-325 MG PO TABS
1.0000 | ORAL_TABLET | Freq: Once | ORAL | Status: AC
  Administered 2014-11-17: 1 via ORAL
  Filled 2014-11-17: qty 1

## 2014-11-17 NOTE — ED Notes (Signed)
Pt taken to CT.

## 2014-11-17 NOTE — ED Provider Notes (Signed)
CSN: [id number]     Arrival date & time 11/17/14  0930 History  By signing my name below, I, [name], attest that this documentation has been prepared under the direction and in the presence of Daleen Bo, MD. Electronically Signed: Julien Nordmann, ED Scribe. 11/17/2014. 10:11 AM.    Chief Complaint  Patient presents with  . Headache  . Hypertension      The history is provided by the patient. No language interpreter was used.   HPI Comments: [name] is a 74 y.o. female who presents to the Emergency Department complaining of constant, gradual worsening right sided headache onset this morning upon awakening. She describes the headache as "ticking". Pt states she did not eat breakfast this morning. Pt has not taken any medication to alleviate the pain. Pt states she does not normally have headaches this severe. Pt explains that she fell face forward about three weeks ago and was evaluated when the fall occurred and reports having intermittent pain after the fall. Pt denies nausea, dizziness, neck pain, back pain, and fever.  Past Medical History  Diagnosis Date  . Diabetes mellitus   . Hypertension   . UTI (lower urinary tract infection)   . Scoliosis   . GERD (gastroesophageal reflux disease)   . Scoliosis    Past Surgical History  Procedure Laterality Date  . Eye surgery      Retina Detachment  . 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 Boil Left     boil removed from left cheek  . Mass excision N/A 06/27/2014    Procedure: EXCISION SUBMANDIBULAR STONE;  Surgeon: Izora Gala, MD;  Location: [facility] OR;  Service: ENT;  Laterality: N/A;  . Sublingual gland removal  2016   History reviewed. No pertinent family history. Social History  Substance Use Topics  . Smoking status: Never Smoker   . Smokeless tobacco: Never Used  . Alcohol Use: No   OB History    No data available     Review of Systems  Constitutional: Negative for fever.  Gastrointestinal: Negative for nausea.   Musculoskeletal: Negative for back pain and neck pain.  Neurological: Positive for headaches. Negative for dizziness.  All other systems reviewed and are negative.     Allergies  Review of patient's allergies indicates no known allergies.  Home Medications   Prior to Admission medications   Medication Sig Start Date End Date Taking? Authorizing Provider  aspirin EC 81 MG tablet Take 81 mg by mouth daily.   Yes Historical Provider, MD  cloNIDine (CATAPRES) 0.1 MG tablet Take 0.1 mg by mouth 2 (two) times daily. For blood pressure   Yes Historical Provider, MD  HUMALOG MIX 50/50 KWIKPEN (50-50) 100 UNIT/ML Kwikpen Take 32-44 Units by mouth 2 (two) times daily with breakfast and lunch. 32 Units at Supper 43 Units in Am 06/01/14  Yes Historical Provider, MD  hydrALAZINE (APRESOLINE) 10 MG tablet Take 10 mg by mouth 3 (three) times daily.   Yes Historical Provider, MD  metoCLOPramide (REGLAN) 10 MG tablet Take 10 mg by mouth 4 (four) times daily. 09/17/14  Yes Historical Provider, MD  metoprolol tartrate (LOPRESSOR) 25 MG tablet Take 0.5 tablets (12.5 mg total) by mouth 2 (two) times daily. 10/13/14  Yes Kathie Dike, MD  olopatadine (PATANOL) 0.1 % ophthalmic solution Place 1 drop into both eyes daily as needed for allergies.    Yes Historical Provider, MD  omeprazole (PRILOSEC) 20 MG capsule Take 20 mg by mouth daily.  For acid reflux   Yes Historical Provider, MD  risedronate (ACTONEL) 150 MG tablet Take 150 mg by mouth every 30 (thirty) days. with water on empty stomach, nothing by mouth or lie down for next 30 minutes.   Yes Historical Provider, MD  simvastatin (ZOCOR) 5 MG tablet Take 5 mg by mouth at bedtime. 06/01/14  Yes Historical Provider, MD   Triage vitals: Ht 5\' 5"  (1.651 m)  Wt 152 lb (68.947 kg)  BMI 25.29 kg/m2 Physical Exam  Constitutional: She is oriented to person, place, and time. She appears well-developed and well-nourished.  HENT:  Head: Normocephalic and atraumatic.   Complete cerumen impaction in left auditory canal, tender left temporal and left posterior region No swelling or crepitation   Eyes: Conjunctivae and EOM are normal. Pupils are equal, round, and reactive to light.  Neck: Normal range of motion and phonation normal. Neck supple.  No meningismus   Cardiovascular: Normal rate and regular rhythm.   Pulmonary/Chest: Effort normal and breath sounds normal. She exhibits no tenderness.  Abdominal: Soft. She exhibits no distension. There is no tenderness. There is no guarding.  Musculoskeletal: Normal range of motion.  Neurological: She is alert and oriented to person, place, and time. She exhibits normal muscle tone.  No dysarthria, aphasia, or nystagmus. No pronator drift or ataxia.   Skin: Skin is warm and dry.  Psychiatric: She has a normal mood and affect. Her behavior is normal. Judgment and thought content normal.  Nursing note and vitals reviewed.   ED Course  Procedures  COORDINATION OF CARE:  10:09 AM Discussed treatment plan which includes x-ray of head and pain medication with pt at bedside and pt agreed to plan.   Medications  cloNIDine (CATAPRES) tablet 0.1 mg (0.1 mg Oral Given 11/17/14 1110)  hydrALAZINE (APRESOLINE) tablet 10 mg (10 mg Oral Given 11/17/14 1110)  metoprolol tartrate (LOPRESSOR) tablet 12.5 mg (12.5 mg Oral Given 11/17/14 1110)  HYDROcodone-acetaminophen (NORCO/VICODIN) 5-325 MG per tablet 1 tablet (1 tablet Oral Given 11/17/14 1020)    Patient Vitals for the past 24 hrs:  BP Temp Temp src Pulse Resp SpO2 Height Weight  11/17/14 1225 156/74 mmHg - - (!) 57 18 98 % - -  11/17/14 1130 (!) 201/92 mmHg - - 60 (!) 9 100 % - -  11/17/14 1127 200/90 mmHg - - 63 10 100 % - -  11/17/14 1100 200/90 mmHg - - 61 15 97 % - -  11/17/14 1030 (!) 205/96 mmHg - - 61 15 99 % - -  11/17/14 1018 - 98.1 F (36.7 C) - - - - - -  11/17/14 1000 199/95 mmHg - - 61 15 99 % - -  11/17/14 0939 - - - - - - 5\' 5"  (1.651 m) 152 lb  (68.947 kg)  11/17/14 0938 193/94 mmHg 98.1 F (36.7 C) Oral 66 16 100 % - -   10:35- discussions with patient, family, and evaluation by pharmacy technician, indicates that the patient is not clear on her treatment for blood pressure. She can recall only taking clonidine 0.1 mg. It is prescribed twice a day. She is also prescribed metoprolol 12 you have milligrams twice a day, and hydralazine 10 mg 3 times a day. She apparently did not take any blood pressure medications this morning. Reviewed recent hospitalization and evaluation by cardiology, post syncope on 10/12/2014  2:00 PM Reevaluation with update and discussion. After initial assessment and treatment, an updated evaluation reveals she states her headache is  gone, and she is hungry. She has no other complaints. Findings discussed with patient, and family member, all questions were answered.. [place] Review Labs Reviewed - No data to display  Imaging Review Ct Head Wo Contrast  11/17/2014  CLINICAL DATA:  Headache EXAM: CT HEAD WITHOUT CONTRAST TECHNIQUE: Contiguous axial images were obtained from the base of the skull through the vertex without intravenous contrast. COMPARISON:  10/12/2014 FINDINGS: No evidence of parenchymal hemorrhage or extra-axial fluid collection. No mass lesion, mass effect, or midline shift. No CT evidence of acute infarction. Subcortical white matter and periventricular small vessel ischemic changes. Intracranial atherosclerosis. Cerebral volume is within normal limits.  No ventriculomegaly. The visualized paranasal sinuses are essentially clear. The mastoid air cells are unopacified. No evidence of calvarial fracture. IMPRESSION: No evidence of acute intracranial abnormality. Small vessel ischemic changes. Electronically Signed   By: Julian Hy M.D.   On: 11/17/2014 10:55   I have personally reviewed and evaluated these images and lab results as part of my medical decision-making.   EKG  Interpretation   Date/Time:  Saturday November 17 2014 09:41:03 EST Ventricular Rate:  64 PR Interval:  191 QRS Duration: 91 QT Interval:  438 QTC Calculation: 452 R Axis:   1 Text Interpretation:  Sinus rhythm since last tracing no significant  change Confirmed by Eulis Foster  MD, Isamar Wellbrock [id number]) on 11/17/2014 9:54:48 AM      MDM   Final diagnoses:  Other headache syndrome  Essential hypertension    Nonspecific headache, with hypertension. Symptoms improved with treatment, using usual medication is single dose of analgesia. Doubt intracranial bleeding, meningitis, serious bacterial infection or metabolic instability.  Nursing Notes Reviewed/ Care Coordinated Applicable Imaging Reviewed Interpretation of Laboratory Data incorporated into ED treatment  The patient appears reasonably screened and/or stabilized for discharge and I doubt any other medical condition or other [facility] requiring further screening, evaluation, or treatment in the ED at this time prior to discharge.  Plan: Home Medications- usual; Home Treatments- rest; return here if the recommended treatment, does not improve the symptoms; Recommended follow up- PCP 1 week and prn   I personally performed the services described in this documentation, which was scribed in my presence. The recorded information has been reviewed and is accurate.       Daleen Bo, MD 11/17/14 [id number]

## 2014-11-17 NOTE — ED Notes (Signed)
Woke up abput 0800 with headache and pt says she does not normally have headaches.  BP at home was 149/112.  Rates pain 10/10.

## 2014-11-17 NOTE — Discharge Instructions (Signed)
General Headache Without Cause A headache is pain or discomfort felt around the head or neck area. There are many causes and types of headaches. In some cases, the cause may not be found.  HOME CARE  Managing Pain  Take over-the-counter and prescription medicines only as told by your doctor.  Lie down in a dark, quiet room when you have a headache.  If directed, apply ice to the head and neck area:  Put ice in a plastic bag.  Place a towel between your skin and the bag.  Leave the ice on for 20 minutes, 2-3 times per day.  Use a heating pad or hot shower to apply heat to the head and neck area as told by your doctor.  Keep lights dim if bright lights bother you or make your headaches worse. Eating and Drinking  Eat meals on a regular schedule.  Lessen how much alcohol you drink.  Lessen how much caffeine you drink, or stop drinking caffeine. General Instructions  Keep all follow-up visits as told by your doctor. This is important.  Keep a journal to find out if certain things bring on headaches. For example, write down:  What you eat and drink.  How much sleep you get.  Any change to your diet or medicines.  Relax by getting a massage or doing other relaxing activities.  Lessen stress.  Sit up straight. Do not tighten (tense) your muscles.  Do not use tobacco products. This includes cigarettes, chewing tobacco, or e-cigarettes. If you need help quitting, ask your doctor.  Exercise regularly as told by your doctor.  Get enough sleep. This often means 7-9 hours of sleep. GET HELP IF:  Your symptoms are not helped by medicine.  You have a headache that feels different than the other headaches.  You feel sick to your stomach (nauseous) or you throw up (vomit).  You have a fever. GET HELP RIGHT AWAY IF:   Your headache becomes really bad.  You keep throwing up.  You have a stiff neck.  You have trouble seeing.  You have trouble speaking.  You have  pain in the eye or ear.  Your muscles are weak or you lose muscle control.  You lose your balance or have trouble walking.  You feel like you will pass out (faint) or you pass out.  You have confusion.   This information is not intended to replace advice given to you by your health care provider. Make sure you discuss any questions you have with your health care provider.   Document Released: 10/01/2007 Document Revised: 09/12/2014 Document Reviewed: 04/16/2014 Elsevier Interactive Patient Education 2016 [employer].  Hypertension Hypertension, commonly called high blood pressure, is when the force of blood pumping through your arteries is too strong. Your arteries are the blood vessels that carry blood from your heart throughout your body. A blood pressure reading consists of a higher number over a lower number, such as 110/72. The higher number (systolic) is the pressure inside your arteries when your heart pumps. The lower number (diastolic) is the pressure inside your arteries when your heart relaxes. Ideally you want your blood pressure below 120/80. Hypertension forces your heart to work harder to pump blood. Your arteries may become narrow or stiff. Having untreated or uncontrolled hypertension can cause heart attack, stroke, kidney disease, and other problems. RISK FACTORS Some risk factors for high blood pressure are controllable. Others are not.  Risk factors you cannot control include:   Race. You may  be at higher risk if you are African American.  Age. Risk increases with age.  Gender. Men are at higher risk than women before age 56 years. After age 60, women are at higher risk than men. Risk factors you can control include:  Not getting enough exercise or physical activity.  Being overweight.  Getting too much fat, sugar, calories, or salt in your diet.  Drinking too much alcohol. SIGNS AND SYMPTOMS Hypertension does not usually cause signs or symptoms. Extremely  high blood pressure (hypertensive crisis) may cause headache, anxiety, shortness of breath, and nosebleed. DIAGNOSIS To check if you have hypertension, your health care provider will measure your blood pressure while you are seated, with your arm held at the level of your heart. It should be measured at least twice using the same arm. Certain conditions can cause a difference in blood pressure between your right and left arms. A blood pressure reading that is higher than normal on one occasion does not mean that you need treatment. If it is not clear whether you have high blood pressure, you may be asked to return on a different day to have your blood pressure checked again. Or, you may be asked to monitor your blood pressure at home for 1 or more weeks. TREATMENT Treating high blood pressure includes making lifestyle changes and possibly taking medicine. Living a healthy lifestyle can help lower high blood pressure. You may need to change some of your habits. Lifestyle changes may include:  Following the DASH diet. This diet is high in fruits, vegetables, and whole grains. It is low in salt, red meat, and added sugars.  Keep your sodium intake below 2,300 mg per day.  Getting at least 30-45 minutes of aerobic exercise at least 4 times per week.  Losing weight if necessary.  Not smoking.  Limiting alcoholic beverages.  Learning ways to reduce stress. Your health care provider may prescribe medicine if lifestyle changes are not enough to get your blood pressure under control, and if one of the following is true:  You are 39-22 years of age and your systolic blood pressure is above 140.  You are 27 years of age or older, and your systolic blood pressure is above 150.  Your diastolic blood pressure is above 90.  You have diabetes, and your systolic blood pressure is over [id number] or your diastolic blood pressure is over 90.  You have kidney disease and your blood pressure is above  140/90.  You have heart disease and your blood pressure is above 140/90. Your personal target blood pressure may vary depending on your medical conditions, your age, and other factors. HOME CARE INSTRUCTIONS  Have your blood pressure rechecked as directed by your health care provider.   Take medicines only as directed by your health care provider. Follow the directions carefully. Blood pressure medicines must be taken as prescribed. The medicine does not work as well when you skip doses. Skipping doses also puts you at risk for problems.  Do not smoke.   Monitor your blood pressure at home as directed by your health care provider. SEEK MEDICAL CARE IF:   You think you are having a reaction to medicines taken.  You have recurrent headaches or feel dizzy.  You have swelling in your ankles.  You have trouble with your vision. SEEK IMMEDIATE MEDICAL CARE IF:  You develop a severe headache or confusion.  You have unusual weakness, numbness, or feel faint.  You have severe chest or abdominal pain.  You vomit repeatedly.  You have trouble breathing. MAKE SURE YOU:   Understand these instructions.  Will watch your condition.  Will get help right away if you are not doing well or get worse.   This information is not intended to replace advice given to you by your health care provider. Make sure you discuss any questions you have with your health care provider.   Document Released: 12/22/2004 Document Revised: 05/08/2014 Document Reviewed: 10/14/2012 Elsevier Interactive Patient Education [employer].

## 2014-11-17 NOTE — ED Notes (Signed)
Dr. Eulis Foster notified of bp and that pt has not taken home bp meds today.  When questioning pt, she was not confident of the medication and dosage that she takes normally. Dr. Eulis Foster notified.

## 2014-12-10 DIAGNOSIS — E1122 Type 2 diabetes mellitus with diabetic chronic kidney disease: Secondary | ICD-10-CM | POA: Diagnosis not present

## 2014-12-10 DIAGNOSIS — I10 Essential (primary) hypertension: Secondary | ICD-10-CM | POA: Diagnosis not present

## 2015-02-23 DIAGNOSIS — M545 Low back pain: Secondary | ICD-10-CM | POA: Diagnosis not present

## 2015-02-23 DIAGNOSIS — N183 Chronic kidney disease, stage 3 (moderate): Secondary | ICD-10-CM | POA: Diagnosis not present

## 2015-02-23 DIAGNOSIS — E1122 Type 2 diabetes mellitus with diabetic chronic kidney disease: Secondary | ICD-10-CM | POA: Diagnosis not present

## 2015-02-23 DIAGNOSIS — I10 Essential (primary) hypertension: Secondary | ICD-10-CM | POA: Diagnosis not present

## 2015-04-02 DIAGNOSIS — B351 Tinea unguium: Secondary | ICD-10-CM | POA: Diagnosis not present

## 2015-04-02 DIAGNOSIS — E1342 Other specified diabetes mellitus with diabetic polyneuropathy: Secondary | ICD-10-CM | POA: Diagnosis not present

## 2015-04-10 DIAGNOSIS — N183 Chronic kidney disease, stage 3 (moderate): Secondary | ICD-10-CM | POA: Diagnosis not present

## 2015-04-10 DIAGNOSIS — M545 Low back pain: Secondary | ICD-10-CM | POA: Diagnosis not present

## 2015-04-10 DIAGNOSIS — E1122 Type 2 diabetes mellitus with diabetic chronic kidney disease: Secondary | ICD-10-CM | POA: Diagnosis not present

## 2015-04-10 DIAGNOSIS — R600 Localized edema: Secondary | ICD-10-CM | POA: Diagnosis not present

## 2015-04-10 DIAGNOSIS — C183 Malignant neoplasm of hepatic flexure: Secondary | ICD-10-CM | POA: Diagnosis not present

## 2015-04-30 DIAGNOSIS — H40232 Intermittent angle-closure glaucoma, left eye: Secondary | ICD-10-CM | POA: Diagnosis not present

## 2015-04-30 DIAGNOSIS — H2512 Age-related nuclear cataract, left eye: Secondary | ICD-10-CM | POA: Diagnosis not present

## 2015-06-18 DIAGNOSIS — E1342 Other specified diabetes mellitus with diabetic polyneuropathy: Secondary | ICD-10-CM | POA: Diagnosis not present

## 2015-06-18 DIAGNOSIS — B351 Tinea unguium: Secondary | ICD-10-CM | POA: Diagnosis not present

## 2015-07-10 DIAGNOSIS — I10 Essential (primary) hypertension: Secondary | ICD-10-CM | POA: Diagnosis not present

## 2015-07-10 DIAGNOSIS — N183 Chronic kidney disease, stage 3 (moderate): Secondary | ICD-10-CM | POA: Diagnosis not present

## 2015-07-10 DIAGNOSIS — M545 Low back pain: Secondary | ICD-10-CM | POA: Diagnosis not present

## 2015-07-10 DIAGNOSIS — E1122 Type 2 diabetes mellitus with diabetic chronic kidney disease: Secondary | ICD-10-CM | POA: Diagnosis not present

## 2015-07-10 DIAGNOSIS — Z0000 Encounter for general adult medical examination without abnormal findings: Secondary | ICD-10-CM | POA: Diagnosis not present

## 2015-08-23 ENCOUNTER — Ambulatory Visit (INDEPENDENT_AMBULATORY_CARE_PROVIDER_SITE_OTHER): Payer: Medicare Other | Admitting: Urology

## 2015-08-23 ENCOUNTER — Other Ambulatory Visit (HOSPITAL_COMMUNITY)
Admission: RE | Admit: 2015-08-23 | Discharge: 2015-08-23 | Disposition: A | Discharge status: Home / Self Care | Payer: Medicare Other | Source: Other Acute Inpatient Hospital | Attending: Urology | Admitting: Urology

## 2015-08-23 DIAGNOSIS — Z87440 Personal history of urinary (tract) infections: Secondary | ICD-10-CM

## 2015-08-23 DIAGNOSIS — R8271 Bacteriuria: Secondary | ICD-10-CM | POA: Diagnosis not present

## 2015-08-23 LAB — URINALYSIS, ROUTINE W REFLEX MICROSCOPIC
Bilirubin Urine: NEGATIVE
GLUCOSE, UA: NEGATIVE mg/dL
HGB URINE DIPSTICK: NEGATIVE
Ketones, ur: NEGATIVE mg/dL
Leukocytes, UA: NEGATIVE
Nitrite: NEGATIVE
PH: 6 (ref 5.0–8.0)
Protein, ur: NEGATIVE mg/dL
SPECIFIC GRAVITY, URINE: 1.01 (ref 1.005–1.030)

## 2015-08-26 LAB — URINE CULTURE

## 2015-08-27 DIAGNOSIS — B351 Tinea unguium: Secondary | ICD-10-CM | POA: Diagnosis not present

## 2015-08-27 DIAGNOSIS — E1342 Other specified diabetes mellitus with diabetic polyneuropathy: Secondary | ICD-10-CM | POA: Diagnosis not present

## 2015-10-04 ENCOUNTER — Ambulatory Visit (INDEPENDENT_AMBULATORY_CARE_PROVIDER_SITE_OTHER): Payer: Medicare Other | Admitting: Urology

## 2015-10-04 DIAGNOSIS — R3915 Urgency of urination: Secondary | ICD-10-CM | POA: Diagnosis not present

## 2015-10-09 DIAGNOSIS — E1122 Type 2 diabetes mellitus with diabetic chronic kidney disease: Secondary | ICD-10-CM | POA: Diagnosis not present

## 2015-10-09 DIAGNOSIS — N183 Chronic kidney disease, stage 3 (moderate): Secondary | ICD-10-CM | POA: Diagnosis not present

## 2015-10-09 DIAGNOSIS — Z23 Encounter for immunization: Secondary | ICD-10-CM | POA: Diagnosis not present

## 2015-10-09 DIAGNOSIS — I10 Essential (primary) hypertension: Secondary | ICD-10-CM | POA: Diagnosis not present

## 2015-11-05 DIAGNOSIS — E1342 Other specified diabetes mellitus with diabetic polyneuropathy: Secondary | ICD-10-CM | POA: Diagnosis not present

## 2015-11-05 DIAGNOSIS — B351 Tinea unguium: Secondary | ICD-10-CM | POA: Diagnosis not present

## 2015-11-20 DIAGNOSIS — E1342 Other specified diabetes mellitus with diabetic polyneuropathy: Secondary | ICD-10-CM | POA: Diagnosis not present

## 2015-12-27 ENCOUNTER — Ambulatory Visit: Payer: Medicare Other | Admitting: Urology

## 2016-01-21 DIAGNOSIS — E1342 Other specified diabetes mellitus with diabetic polyneuropathy: Secondary | ICD-10-CM | POA: Diagnosis not present

## 2016-01-21 DIAGNOSIS — B351 Tinea unguium: Secondary | ICD-10-CM | POA: Diagnosis not present

## 2016-02-05 DIAGNOSIS — N183 Chronic kidney disease, stage 3 (moderate): Secondary | ICD-10-CM | POA: Diagnosis not present

## 2016-02-05 DIAGNOSIS — E1122 Type 2 diabetes mellitus with diabetic chronic kidney disease: Secondary | ICD-10-CM | POA: Diagnosis not present

## 2016-02-05 DIAGNOSIS — I10 Essential (primary) hypertension: Secondary | ICD-10-CM | POA: Diagnosis not present

## 2016-03-31 DIAGNOSIS — E1342 Other specified diabetes mellitus with diabetic polyneuropathy: Secondary | ICD-10-CM | POA: Diagnosis not present

## 2016-03-31 DIAGNOSIS — B351 Tinea unguium: Secondary | ICD-10-CM | POA: Diagnosis not present

## 2016-05-27 DIAGNOSIS — E1122 Type 2 diabetes mellitus with diabetic chronic kidney disease: Secondary | ICD-10-CM | POA: Diagnosis not present

## 2016-05-27 DIAGNOSIS — I10 Essential (primary) hypertension: Secondary | ICD-10-CM | POA: Diagnosis not present

## 2016-05-27 DIAGNOSIS — M4135 Thoracogenic scoliosis, thoracolumbar region: Secondary | ICD-10-CM | POA: Diagnosis not present

## 2016-05-27 DIAGNOSIS — M159 Polyosteoarthritis, unspecified: Secondary | ICD-10-CM | POA: Diagnosis not present

## 2016-05-27 DIAGNOSIS — M545 Low back pain: Secondary | ICD-10-CM | POA: Diagnosis not present

## 2016-06-16 DIAGNOSIS — B351 Tinea unguium: Secondary | ICD-10-CM | POA: Diagnosis not present

## 2016-06-16 DIAGNOSIS — E1342 Other specified diabetes mellitus with diabetic polyneuropathy: Secondary | ICD-10-CM | POA: Diagnosis not present

## 2016-08-25 DIAGNOSIS — B351 Tinea unguium: Secondary | ICD-10-CM | POA: Diagnosis not present

## 2016-08-25 DIAGNOSIS — E1342 Other specified diabetes mellitus with diabetic polyneuropathy: Secondary | ICD-10-CM | POA: Diagnosis not present

## 2016-08-26 DIAGNOSIS — I10 Essential (primary) hypertension: Secondary | ICD-10-CM | POA: Diagnosis not present

## 2016-08-26 DIAGNOSIS — E1165 Type 2 diabetes mellitus with hyperglycemia: Secondary | ICD-10-CM | POA: Diagnosis not present

## 2016-08-26 DIAGNOSIS — Z0000 Encounter for general adult medical examination without abnormal findings: Secondary | ICD-10-CM | POA: Diagnosis not present

## 2016-08-26 DIAGNOSIS — N183 Chronic kidney disease, stage 3 (moderate): Secondary | ICD-10-CM | POA: Diagnosis not present

## 2016-11-03 DIAGNOSIS — E1342 Other specified diabetes mellitus with diabetic polyneuropathy: Secondary | ICD-10-CM | POA: Diagnosis not present

## 2016-11-03 DIAGNOSIS — B351 Tinea unguium: Secondary | ICD-10-CM | POA: Diagnosis not present

## 2016-11-30 DIAGNOSIS — E1122 Type 2 diabetes mellitus with diabetic chronic kidney disease: Secondary | ICD-10-CM | POA: Diagnosis not present

## 2016-11-30 DIAGNOSIS — E1165 Type 2 diabetes mellitus with hyperglycemia: Secondary | ICD-10-CM | POA: Diagnosis not present

## 2016-11-30 DIAGNOSIS — N183 Chronic kidney disease, stage 3 (moderate): Secondary | ICD-10-CM | POA: Diagnosis not present

## 2016-11-30 DIAGNOSIS — I10 Essential (primary) hypertension: Secondary | ICD-10-CM | POA: Diagnosis not present

## 2016-11-30 DIAGNOSIS — G250 Essential tremor: Secondary | ICD-10-CM | POA: Diagnosis not present

## 2017-01-11 DIAGNOSIS — E1121 Type 2 diabetes mellitus with diabetic nephropathy: Secondary | ICD-10-CM | POA: Diagnosis not present

## 2017-01-11 DIAGNOSIS — R2689 Other abnormalities of gait and mobility: Secondary | ICD-10-CM | POA: Diagnosis not present

## 2017-01-11 DIAGNOSIS — M1991 Primary osteoarthritis, unspecified site: Secondary | ICD-10-CM | POA: Diagnosis not present

## 2017-01-11 DIAGNOSIS — G2111 Neuroleptic induced parkinsonism: Secondary | ICD-10-CM | POA: Diagnosis not present

## 2017-01-19 DIAGNOSIS — E1342 Other specified diabetes mellitus with diabetic polyneuropathy: Secondary | ICD-10-CM | POA: Diagnosis not present

## 2017-01-19 DIAGNOSIS — B351 Tinea unguium: Secondary | ICD-10-CM | POA: Diagnosis not present

## 2017-02-23 DIAGNOSIS — R2689 Other abnormalities of gait and mobility: Secondary | ICD-10-CM | POA: Diagnosis not present

## 2017-02-23 DIAGNOSIS — G2111 Neuroleptic induced parkinsonism: Secondary | ICD-10-CM | POA: Diagnosis not present

## 2017-02-23 DIAGNOSIS — K3184 Gastroparesis: Secondary | ICD-10-CM | POA: Diagnosis not present

## 2017-02-23 DIAGNOSIS — E1121 Type 2 diabetes mellitus with diabetic nephropathy: Secondary | ICD-10-CM | POA: Diagnosis not present

## 2017-03-15 ENCOUNTER — Emergency Department (HOSPITAL_COMMUNITY)
Admission: EM | Admit: 2017-03-15 | Discharge: 2017-03-15 | Disposition: A | Discharge status: Home / Self Care | Payer: Medicare Other | Attending: Emergency Medicine | Admitting: Emergency Medicine

## 2017-03-15 ENCOUNTER — Encounter (HOSPITAL_COMMUNITY): Payer: Self-pay | Admitting: Emergency Medicine

## 2017-03-15 ENCOUNTER — Other Ambulatory Visit: Payer: Self-pay

## 2017-03-15 DIAGNOSIS — G20 Parkinson's disease: Secondary | ICD-10-CM | POA: Insufficient documentation

## 2017-03-15 DIAGNOSIS — Z79899 Other long term (current) drug therapy: Secondary | ICD-10-CM | POA: Diagnosis not present

## 2017-03-15 DIAGNOSIS — Z794 Long term (current) use of insulin: Secondary | ICD-10-CM | POA: Insufficient documentation

## 2017-03-15 DIAGNOSIS — E119 Type 2 diabetes mellitus without complications: Secondary | ICD-10-CM | POA: Insufficient documentation

## 2017-03-15 DIAGNOSIS — R42 Dizziness and giddiness: Secondary | ICD-10-CM | POA: Diagnosis not present

## 2017-03-15 DIAGNOSIS — T50901A Poisoning by unspecified drugs, medicaments and biological substances, accidental (unintentional), initial encounter: Secondary | ICD-10-CM | POA: Insufficient documentation

## 2017-03-15 DIAGNOSIS — I10 Essential (primary) hypertension: Secondary | ICD-10-CM | POA: Diagnosis not present

## 2017-03-15 DIAGNOSIS — Z7982 Long term (current) use of aspirin: Secondary | ICD-10-CM | POA: Insufficient documentation

## 2017-03-15 DIAGNOSIS — T50991A Poisoning by other drugs, medicaments and biological substances, accidental (unintentional), initial encounter: Secondary | ICD-10-CM | POA: Diagnosis not present

## 2017-03-15 HISTORY — DX: Parkinson's disease: G20

## 2017-03-15 HISTORY — DX: Parkinson's disease without dyskinesia, without mention of fluctuations: G20.A1

## 2017-03-15 NOTE — ED Triage Notes (Signed)
Pt took all todays meds around 1130. Pt states she feels crazy but cannot describe how she feels.

## 2017-03-15 NOTE — ED Provider Notes (Signed)
Medical screening examination/treatment/procedure(s) were conducted as a shared visit with non-physician practitioner(s) and myself.  I personally evaluated the patient during the encounter.  Per nursing note:   Pt took x2 clonidine0.1mg , lisinopril/hctz 20/12.5mg  x1, simvastatin 5mg  x1, hydralazine 10mg  x3, diltiazem 360mg  x1, tylenol with codeine 300/30mg  x 2, reglan 10mg  x4, and potassium 75meq x1. Per poison control pt is out of window for any more care. They recommed pt not taking any more meds tonight and she can restart in am.  My examination, patient is astigmatic.  No lightheadedness, dizziness.  Vital signs are within normal limits most importantly her heart rate and her blood pressure are normal.  Her abdomen is benign her neurologic exam is intact.  Will follow poison control recommendations discharge for restart medications tomorrow.  Her daughter who is with her will employ a different method of patient's self administration of medications.   Rahel Carlton, Corene Cornea, MD 03/16/17 [phone]

## 2017-03-15 NOTE — ED Notes (Signed)
Pt wheeled to family car. Pt verbalized understanding of discharge instructions.

## 2017-03-15 NOTE — ED Notes (Signed)
Pt took x2 clonidine0.1mg , lisinopril/hctz 20/12.5mg  x1, simvastatin 5mg  x1, hydralazine 10mg  x3, diltiazem 360mg  x1, tylenol with codeine 300/30mg  x 2, reglan 10mg  x4, and potassium 47meq x1. Per poison control pt is out of window for any more care. They recommed pt not taking any more meds tonight and she can restart in am.

## 2017-03-15 NOTE — Discharge Instructions (Signed)
Do not take any additional blood pressure medicines today. Resume your normal dosing schedule tomorrow.

## 2017-03-15 NOTE — ED Provider Notes (Signed)
[facility] EMERGENCY DEPARTMENT Provider Note   CSN: [id number] Arrival date & time: 03/15/17  1914     History   Chief Complaint Chief Complaint  Patient presents with  . Ingestion    HPI [name] is a 77 y.o. female.  Family has just instituted a new medication administration plan for the patient with her doses separated into time of day containers. Patient was confused and accidentally ingested both her morning and evening medications earlier today. She reports feeling lightheaded and "crazy" earlier, but those symptoms have resolved. She denies chest pain, shortness of breath, nausea, vomiting, abdominal pain.   The history is provided by the patient and a relative. No language interpreter was used.  Ingestion  This is a new problem. The current episode started 6 to 12 hours ago. Pertinent negatives include no chest pain, no abdominal pain, no headaches and no shortness of breath.    Past Medical History:  Diagnosis Date  . Diabetes mellitus   . GERD (gastroesophageal reflux disease)   . Hypertension   . Parkinson disease ([place])   . Scoliosis   . Scoliosis   . UTI (lower urinary tract infection)     Patient Active Problem List   Diagnosis Date Noted  . Syncope 10/12/2014  . Diabetes ([place]) 10/12/2014  . Hypertension 10/12/2014  . OSTEOARTHRITIS, 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  . TRIGGER FINGER 04/27/2007    Past Surgical History:  Procedure Laterality Date  . boil Left    boil removed from left cheek  . EYE SURGERY     Retina Detachment  . MASS EXCISION N/A 06/27/2014   Procedure: EXCISION SUBMANDIBULAR STONE;  Surgeon: Izora Gala, MD;  Location: [facility] OR;  Service: ENT;  Laterality: N/A;  . sublingual gland removal  2016  . 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 Authorizing Provider  aspirin EC 81 MG tablet Take 81 mg by mouth daily.     [provider]  cloNIDine (CATAPRES) 0.1 MG tablet Take 0.1 mg by mouth 2 (two) times daily. For blood pressure    [provider]  HUMALOG MIX 50/50 KWIKPEN (50-50) 100 UNIT/ML Kwikpen Take 32-44 Units by mouth 2 (two) times daily with breakfast and lunch. 32 Units at Supper 43 Units in Am 06/01/14   [provider]  hydrALAZINE (APRESOLINE) 10 MG tablet Take 10 mg by mouth 3 (three) times daily.    [provider]  metoCLOPramide (REGLAN) 10 MG tablet Take 10 mg by mouth 4 (four) times daily. 09/17/14   [provider]  metoprolol tartrate (LOPRESSOR) 25 MG tablet Take 0.5 tablets (12.5 mg total) by mouth 2 (two) times daily. 10/13/14   Kathie Dike, MD  olopatadine (PATANOL) 0.1 % ophthalmic solution Place 1 drop into both eyes daily as needed for allergies.     [provider]  omeprazole (PRILOSEC) 20 MG capsule Take 20 mg by mouth daily. For acid reflux    [provider]  risedronate (ACTONEL) 150 MG tablet Take 150 mg by mouth every 30 (thirty) days. with water on empty stomach, nothing by mouth or lie down for next 30 minutes.    [provider]  simvastatin (ZOCOR) 5 MG tablet Take 5 mg by mouth at bedtime. 06/01/14   [provider]    Family  History History reviewed. No pertinent family history.  Social History Social History   Tobacco Use  . Smoking status: Never Smoker  . Smokeless tobacco: Never Used  Substance Use Topics  . Alcohol use: No    Alcohol/week: 0.0 oz  . Drug use: No     Allergies   Patient has no known allergies.   Review of Systems Review of Systems  Respiratory: Negative for shortness of breath.   Cardiovascular: Negative for chest pain.  Gastrointestinal: Negative for abdominal pain.  Neurological: Negative for headaches.  All other systems reviewed and are negative.    Physical Exam Updated Vital Signs BP (!) 129/56 (BP Location: Right Arm)   Pulse 60   Temp  98.5 F (36.9 C) (Oral)   Resp 18   Ht 5\' 5"  (1.651 m)   Wt 68.9 kg (152 lb)   SpO2 93%   BMI 25.29 kg/m   Physical Exam  Constitutional: She is oriented to person, place, and time. She appears well-developed and well-nourished.  HENT:  Head: Atraumatic.  Eyes: Conjunctivae are normal.  Neck: Neck supple.  Cardiovascular: Normal rate and regular rhythm.  Pulmonary/Chest: Effort normal and breath sounds normal.  Abdominal: Soft. Bowel sounds are normal.  Musculoskeletal: Normal range of motion.  Neurological: She is alert and oriented to person, place, and time.  Skin: Skin is warm and dry.  Psychiatric: She has a normal mood and affect.  Nursing note and vitals reviewed.    ED Treatments / Results  Labs (all labs ordered are listed, but only abnormal results are displayed) Labs Reviewed - No data to display  EKG  EKG Interpretation None       Radiology No results found.  Procedures Procedures (including critical care time)  Medications Ordered in ED Medications - No data to display   Initial Impression / Assessment and Plan / ED Course  I have reviewed the triage vital signs and the nursing notes.  Pertinent labs & imaging results that were available during my care of the patient were reviewed by me and considered in my medical decision making (see chart for details).     Patient discussed with and seen by Dr. Dayna Barker.  Patient with accidental ingestion of her morning and evening doses of medications this morning. Per poison control, patient is outside of the window for need of additional care or monitoring. Patient with normal mentation and vital signs. Patient advised not to take any additional medications today, and may resume her normal schedule tomorrow. Patient discharged home with family.  Final Clinical Impressions(s) / ED Diagnoses   Final diagnoses:  Accidental drug ingestion, initial encounter    ED Discharge Orders    None       Etta Quill, NP 03/15/17 2035    Merrily Pew, MD 03/16/17 [phone]

## 2017-03-30 DIAGNOSIS — B351 Tinea unguium: Secondary | ICD-10-CM | POA: Diagnosis not present

## 2017-03-30 DIAGNOSIS — E1342 Other specified diabetes mellitus with diabetic polyneuropathy: Secondary | ICD-10-CM | POA: Diagnosis not present

## 2017-04-06 ENCOUNTER — Ambulatory Visit (HOSPITAL_COMMUNITY): Payer: Medicare Other | Attending: Neurology

## 2017-04-06 ENCOUNTER — Encounter (HOSPITAL_COMMUNITY): Payer: Self-pay

## 2017-04-06 DIAGNOSIS — R29898 Other symptoms and signs involving the musculoskeletal system: Secondary | ICD-10-CM | POA: Insufficient documentation

## 2017-04-06 DIAGNOSIS — R296 Repeated falls: Secondary | ICD-10-CM | POA: Insufficient documentation

## 2017-04-06 DIAGNOSIS — R2689 Other abnormalities of gait and mobility: Secondary | ICD-10-CM | POA: Insufficient documentation

## 2017-04-06 DIAGNOSIS — R2681 Unsteadiness on feet: Secondary | ICD-10-CM | POA: Insufficient documentation

## 2017-04-14 ENCOUNTER — Other Ambulatory Visit: Payer: Self-pay

## 2017-04-14 ENCOUNTER — Ambulatory Visit (HOSPITAL_COMMUNITY): Payer: Medicare Other | Admitting: Physical Therapy

## 2017-04-14 ENCOUNTER — Encounter (HOSPITAL_COMMUNITY): Payer: Self-pay | Admitting: Physical Therapy

## 2017-04-14 DIAGNOSIS — R2689 Other abnormalities of gait and mobility: Secondary | ICD-10-CM | POA: Diagnosis not present

## 2017-04-14 DIAGNOSIS — R2681 Unsteadiness on feet: Secondary | ICD-10-CM

## 2017-04-14 DIAGNOSIS — R29898 Other symptoms and signs involving the musculoskeletal system: Secondary | ICD-10-CM

## 2017-04-14 DIAGNOSIS — R296 Repeated falls: Secondary | ICD-10-CM

## 2017-04-14 NOTE — Therapy (Signed)
[place], [place], [address] Phone: [phone]   Fax:  [phone]  Physical Therapy Evaluation  Patient Details  Name: [name] MRN: [id number] Date of Birth: 05/10/1940 Referring Provider: Phillips Odor MD   Encounter Date: 04/14/2017  PT End of Session - 04/14/17 1603    Visit Number  1    Number of Visits  17    Date for PT Re-Evaluation  06/09/17 Mini re-asses on 05/12/17    Authorization Type  UHC Medicare; Secondary: Medicaid [place] Time Period  04/14/17 - 06/09/17    Authorization - Visit Number  1    Authorization - Number of Visits  10 Re-assess every 10 visits    PT Start Time  1121    PT Stop Time  1204    PT Time Calculation (min)  43 min    Equipment Utilized During Treatment  Gait belt    Activity Tolerance  Patient tolerated treatment well;No increased pain    Behavior During Therapy  WFL for tasks assessed/performed       Past Medical History:  Diagnosis Date  . Diabetes mellitus   . GERD (gastroesophageal reflux disease)   . Hypertension   . Parkinson disease ([place])   . Scoliosis   . Scoliosis   . UTI (lower urinary tract infection)     Past Surgical History:  Procedure Laterality Date  . boil Left    boil removed from left cheek  . EYE SURGERY     Retina Detachment  . MASS EXCISION N/A 06/27/2014   Procedure: EXCISION SUBMANDIBULAR STONE;  Surgeon: Izora Gala, MD;  Location: [facility] OR;  Service: ENT;  Laterality: N/A;  . sublingual gland removal  2016  . 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   Subjective Assessment - 04/14/17 1128    Subjective  Patient and patient's daughter reported that patient has had trouble with her balance for several years and has been using a cane to walk, but that her balance has become worse over the past 4 to 5 months. Patient's daughter stated that patient was diagnosed with Parkinson's Disease  about 2 months ago. Patient's daughter stated that patient used to be able to walk without her cane as much, but that she has been using the cane more over the past 4-5 months. Patient stated that she is having back pain at this time. Patient and patient's daughter reported that patient overmedicated with her high blood pressure medication about 3 weeks ago, but that the daughter has since controlled patient's medication better. Patient denied any tingling or numbness, any bowel and bladder changes, or any fever or night sweats.     Patient is accompained by:  Family member Daughter    Pertinent History  HTN; Parkinson's Disease; DMII; CKD Stage III    Limitations  Standing;Walking;House hold activities    How long can you sit comfortably?  unlimted    How long can you stand comfortably?  10-15 minutes    How long can you walk comfortably?  5 minutes    Patient Stated Goals  Improve balance and walking     Currently in Pain?  Yes    Pain Score  9     Pain Location  Back    Pain Orientation  Lower    Pain Descriptors / Indicators  Dull    Pain Type  Chronic pain    Pain Onset  More than a month ago    Aggravating Factors   Standing    Pain Relieving Factors  Sitting    Multiple Pain Sites  No         [facility] PT Assessment - 04/14/17 0001      Assessment   Medical Diagnosis  Falling and gait impairments    Referring Provider  Phillips Odor MD    Onset Date/Surgical Date  -- Many years, but worse balance last 4-5 months    Next MD Visit  04/21/17    Prior Therapy  No      Precautions   Precautions  None      Restrictions   Weight Bearing Restrictions  No      Balance Screen   Has the patient fallen in the past 6 months  Yes    How many times?  2    Has the patient had a decrease in activity level because of a fear of falling?   Yes    Is the patient reluctant to leave their home because of a fear of falling?   No      Home Social worker  Private residence     Living Arrangements  Children with daughter    Type of [place] to enter    Entrance Stairs-Number of Steps  5    Entrance Stairs-Rails  Right;Left;Can reach both    [place]  One level    [place] - 4 wheels;Cane - single point    Additional Comments  Patient's daughter is considering getting grab bars for the bathroom      Prior Function   Level of Independence  Needs assistance with ADLs;Requires assistive device for independence;Independent with household mobility without device;Independent with gait;Independent with transfers    Vocation  Retired    Leisure  Air traffic controller   Overall Cognitive Status  Within Functional Limits for tasks assessed      Observation/Other Assessments   Observations  Noted bilateral ankle swelling, patient's daughter reported that patient's physician is aware    Focus on Therapeutic Outcomes (FOTO)   49% (51% limited)      Sensation   Light Touch  Not tested Patient denied any tingling or numbness      Posture/Postural Control   Posture/Postural Control  Postural limitations    Postural Limitations  Rounded Shoulders;Forward head;Increased thoracic kyphosis;Flexed trunk      Tone   Assessment Location  -- Ankle clonus absent bilaterally      Strength   Right/Left Hip  Right;Left    Right Hip Flexion  4/5    Right Hip Extension  2/5    Right Hip ABduction  3+/5    Left Hip Flexion  4/5    Left Hip Extension  2+/5    Left Hip ABduction  3+/5    Right/Left Knee  Right;Left    Right Knee Flexion  4+/5    Right Knee Extension  4+/5    Left Knee Flexion  4+/5    Left Knee Extension  4+/5    Right/Left Ankle  Right;Left    Right Ankle Dorsiflexion  4/5    Right Ankle Plantar Flexion  2+/5    Left Ankle Dorsiflexion  4/5    Left Ankle Plantar Flexion  2/5      Flexibility  Hamstrings  Limited bilaterally. With 90/90 test 50 degrees from 90 on the left and 45 degrees from 90 on the  right.       Transfers   Sit to Stand  5: Supervision    Sit to Stand Details  Verbal cues for technique    Sit to Stand Details (indicate cue type and reason)  Verbal cues for technique    Supine to Sit  5: Supervision    Supine to Sit Details (indicate cue type and reason)  Verbal cues for technique    Sit to Supine  5: Supervision    Sit to Supine Details (indicate cue type and reason)  Verbal cues for technique      Ambulation/Gait   Ambulation/Gait  Yes    Ambulation/Gait Assistance  4: Min guard    Ambulation Distance (Feet)  138 Feet 3MWT. 62 feet, then 76 feet with seated rest break in betwee    Assistive device  Straight cane    Gait Pattern  Decreased arm swing - right;Decreased arm swing - left;Decreased stride length;Decreased hip/knee flexion - right;Decreased hip/knee flexion - left;Decreased dorsiflexion - right;Decreased dorsiflexion - left;Trunk flexed    Ambulation Surface  Level    Gait velocity  0.33 m/s  Last 76 feet walked at this velocity    Gait Comments  Patient ambulated at a very slow pace with forward flexed trunk and single point cane, gait belt applied      Static Standing Balance   Static Standing - Balance Support  No upper extremity supported    Static Standing Balance -  Activities   Single Leg Stance - Right Leg;Single Leg Stance - Left Leg    Static Standing - Comment/# of Minutes  Patient able to lift foot but hold SLS for 0 seconds bilaterally      Standardized Balance Assessment   Standardized Balance Assessment  Five Times Sit to Stand    Five times sit to stand comments   26.32 seconds leaning trunk far forward with intermittent use of upper extremities on thighs                Objective measurements completed on examination: See above findings.              PT Education - 04/14/17 1602    Education provided  Yes    Education Details  Patient and patient's daughter were educated on examination findings and plan of care.      Person(s) Educated  Patient;Child(ren) Patient's daughter    Methods  Explanation    Comprehension  Verbalized understanding       PT Short Term Goals - 04/14/17 1619      PT SHORT TERM GOAL #1   Title  Patient and patient's family will demonstrate understanding and report regular compliance with home exercise program to improve patient's balance, flexibility, and strength.     Time  4    Period  Weeks    Status  New    Target Date  05/12/17      PT SHORT TERM GOAL #2   Title  Patient will demonstrate improvement in MMT strength of 1/2 grade in all tested musculature deficient at evalution to assist patient with ambulation and other fucntional mobility.     Time  4    Period  Weeks    Status  New    Target Date  05/12/17      PT SHORT TERM GOAL #3   Title  Patient will demonstrate ability to maintain single limb stance for 3 seconds on each lower extremity to assist with stair ambulation.     Time  4    Period  Weeks    Status  New    Target Date  05/12/17        PT Long Term Goals - 04/14/17 1623      PT LONG TERM GOAL #1   Title  Patient will demonstrate improvement in MMT strength of 1 grade in all tested musculature deficient at evalution to assist patient with ambulation and other fucntional mobility.     Time  8    Period  Weeks    Status  New    Target Date  06/09/17      PT LONG TERM GOAL #2   Title  Patient will demonstrate improvement in ability to perform 5 x sit to stand by 10 seconds indicating improvement in balance and functional mobility.     Time  8    Period  Weeks    Status  New    Target Date  06/09/17      PT LONG TERM GOAL #3   Title  Patient will demonstate ability to ambulate an improvement of 100 feet on the 3 minute walk test demonstrating improved velocity and safety with ambulation.     Time  8    Period  Weeks    Status  New    Target Date  06/09/17             Plan - 04/14/17 1631    Clinical Impression Statement   Patient is a 77 year old female who presented to physical therapy for evaluation with daughter with concerns about patient's balance and gait. Upon examination therapist noted weakness in patient's bilateral lower extremities and decreased flexibility in patient's bilateral lower extremities. Patient's balance was decreased with single limb stance which patient was unable to maintain for 1 second and with assessment of the 5 times sit to stand test which patient performed in 26.32 seconds. Additionally from observing patient with bed mobility and transfers patient required increased time and some cueing to assist with good form. Upon examination of patient's ambulation noted poor mechanics with forward flexed trunk, decreased arm swing, decreased stride length and decreased foot clearance. Patient would benefit from skilled physical therapy to improve patient's strength, flexibility, posture, gait, balance, and overall functional mobility.     History and Personal Factors relevant to plan of care:  Parkinson's Disease; HTN; Type II DM; CKD Stage III    Clinical Presentation  Evolving    Clinical Presentation due to:  FOTO; 3MWT; 5 xSTS; MMT; Clinical judgement    Clinical Decision Making  Moderate    Rehab Potential  Fair    Clinical Impairments Affecting Rehab Potential  Positive: support system, motivated; Negative: progressive nature of PD; comorbidities     PT Frequency  2x / week    PT Duration  8 weeks    PT Treatment/Interventions  ADLs/Self Care Home Management;Aquatic Therapy;Moist Heat;DME Instruction;Gait training;Stair training;Functional mobility training;Therapeutic activities;Therapeutic exercise;Balance training;Neuromuscular re-education;Patient/family education;Orthotic Fit/Training;Manual techniques;Cryotherapy;Passive range of motion;Dry needling;Energy conservation;Taping;Visual/perceptual remediation/compensation    PT Next Visit Plan  Review patient's evaluation/goals; ask abuse  literacy/language questions; initiate HEP including: HS stretch, bridges, straight leg raises. Initiate balance training and gait training consider incorporating metronome to assist with gait speed    PT Home Exercise Plan  Initiate first treatment    Consulted and Agree with Plan  of Care  Patient;Family member/caregiver    Family Member Consulted  Patient's daughter       Patient will benefit from skilled therapeutic intervention in order to improve the following deficits and impairments:  Abnormal gait, Decreased balance, Decreased endurance, Decreased mobility, Difficulty walking, Improper body mechanics, Decreased activity tolerance, Decreased coordination, Decreased strength, Impaired flexibility, Postural dysfunction, Pain  Visit Diagnosis: Unsteadiness on feet  Other abnormalities of gait and mobility  Repeated falls  Other symptoms and signs involving the musculoskeletal system     Problem List Patient Active Problem List   Diagnosis Date Noted  . Syncope 10/12/2014  . Diabetes ([place]) 10/12/2014  . Hypertension 10/12/2014  . OSTEOARTHRITIS, 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  . TRIGGER FINGER 04/27/2007   Clarene Critchley PT, DPT 10:18 PM, 04/14/17 [place], [place], [address] Phone: [phone]   Fax:  [phone]  Name: [name] MRN: [id number] Date of Birth: 21-Dec-1940

## 2017-04-15 ENCOUNTER — Encounter (HOSPITAL_COMMUNITY): Payer: Self-pay

## 2017-04-15 ENCOUNTER — Ambulatory Visit (HOSPITAL_COMMUNITY): Payer: Medicare Other

## 2017-04-15 DIAGNOSIS — R2681 Unsteadiness on feet: Secondary | ICD-10-CM

## 2017-04-15 DIAGNOSIS — R2689 Other abnormalities of gait and mobility: Secondary | ICD-10-CM | POA: Diagnosis not present

## 2017-04-15 DIAGNOSIS — R29898 Other symptoms and signs involving the musculoskeletal system: Secondary | ICD-10-CM

## 2017-04-15 DIAGNOSIS — R296 Repeated falls: Secondary | ICD-10-CM | POA: Diagnosis not present

## 2017-04-15 NOTE — Patient Instructions (Addendum)
Bridge    Lie back, legs bent. Inhale, pressing hips up. Keeping ribs in, lengthen lower back. Exhale, rolling down along spine from top. Repeat 10 times. Do 2 sessions per day.  http://pm.exer.us/55   Copyright  VHI. All rights reserved.   HIP: Flexion / KNEE: Extension, Straight Leg Raise    Raise leg, keeping knee straight! Perform slowly. 10 reps per set, 2 sets per day, 4 days per week   Copyright  VHI. All rights reserved.   Hamstring: Towel Stretch (Supine)    Lie on back. Loop towel around left foot, hip and knee at 90. Straighten knee and pull foot toward body. Hold 30 seconds. Relax. Repeat 3 times. Do 2 times a day. Repeat with other leg.    Copyright  VHI. All rights reserved.    Long [employer]    Straighten operated leg and try to hold it 3 seconds.  Repeat 10 times. Do 2 sessions a day.  http://gt2.exer.us/311   Copyright  VHI. All rights reserved.

## 2017-04-15 NOTE — Therapy (Signed)
[place], [place], [address] Phone: [phone]   Fax:  [phone]  Physical Therapy Treatment  Patient Details  Name: [name] MRN: [id number] Date of Birth: [birth year] Referring Provider: Phillips Odor MD   Encounter Date: 04/15/2017  PT End of Session - 04/15/17 1131    Visit Number  2    Number of Visits  17    Date for PT Re-Evaluation  06/09/17 Minireassess 05/12/17    Authorization Type  UHC Medicare; Secondary: Medicaid [place] Time Period  04/14/17 - 06/09/17    Authorization - Visit Number  2    Authorization - Number of Visits  10 reassess every 10 sessions/ FOTO    PT Start Time  2951    PT Stop Time  1202    PT Time Calculation (min)  39 min    Activity Tolerance  Patient tolerated treatment well;No increased pain    Behavior During Therapy  WFL for tasks assessed/performed       Past Medical History:  Diagnosis Date  . Diabetes mellitus   . GERD (gastroesophageal reflux disease)   . Hypertension   . Parkinson disease ([place])   . Scoliosis   . Scoliosis   . UTI (lower urinary tract infection)     Past Surgical History:  Procedure Laterality Date  . boil Left    boil removed from left cheek  . EYE SURGERY     Retina Detachment  . MASS EXCISION N/A 06/27/2014   Procedure: EXCISION SUBMANDIBULAR STONE;  Surgeon: Izora Gala, MD;  Location: [facility] OR;  Service: ENT;  Laterality: N/A;  . sublingual gland removal  2016  . 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  Subjective Assessment - 04/15/17 1130    Subjective  Pt arrived ambulating with SPC, reports LBP pain scale 9/10 achey pain.     Pertinent History  HTN; Parkinson's Disease; DMII; CKD Stage III    Patient Stated Goals  Improve balance and walking     Currently in Pain?  Yes    Pain Score  9     Pain Location  Back    Pain Orientation  Lower    Pain Descriptors / Indicators   Aching    Pain Type  Chronic pain    Pain Onset  More than a month ago    Pain Frequency  Intermittent    Aggravating Factors   Standing    Pain Relieving Factors  Sitting         [facility] Adult PT Treatment/Exercise - 04/15/17 0001      Exercises   Exercises  Knee/Hip      Knee/Hip Exercises: Stretches   Active Hamstring Stretch  2 reps;30 seconds    Active Hamstring Stretch Limitations  supine with sheet      Knee/Hip Exercises: Seated   Long Arc Quad  Both;10 reps      Knee/Hip Exercises: Supine   Bridges  2 sets;5 reps    Straight Leg Raises  10 reps    Straight Leg Raises Limitations  max cueing for form with exercise             PT Education - 04/14/17 1602    Education provided  Yes    Education Details  Patient and patient's daughter were educated on examination findings and plan of care.  Person(s) Educated  Patient;Child(ren) Patient's daughter    Methods  Explanation    Comprehension  Verbalized understanding       PT Short Term Goals - 04/14/17 1619      PT SHORT TERM GOAL #1   Title  Patient and patient's family will demonstrate understanding and report regular compliance with home exercise program to improve patient's balance, flexibility, and strength.     Time  4    Period  Weeks    Status  New    Target Date  05/12/17      PT SHORT TERM GOAL #2   Title  Patient will demonstrate improvement in MMT strength of 1/2 grade in all tested musculature deficient at evalution to assist patient with ambulation and other fucntional mobility.     Time  4    Period  Weeks    Status  New    Target Date  05/12/17      PT SHORT TERM GOAL #3   Title  Patient will demonstrate ability to maintain single limb stance for 3 seconds on each lower extremity to assist with stair ambulation.     Time  4    Period  Weeks    Status  New    Target Date  05/12/17        PT Long Term Goals - 04/14/17 1623      PT LONG TERM GOAL #1   Title  Patient will  demonstrate improvement in MMT strength of 1 grade in all tested musculature deficient at evalution to assist patient with ambulation and other fucntional mobility.     Time  8    Period  Weeks    Status  New    Target Date  06/09/17      PT LONG TERM GOAL #2   Title  Patient will demonstrate improvement in ability to perform 5 x sit to stand by 10 seconds indicating improvement in balance and functional mobility.     Time  8    Period  Weeks    Status  New    Target Date  06/09/17      PT LONG TERM GOAL #3   Title  Patient will demonstate ability to ambulate an improvement of 100 feet on the 3 minute walk test demonstrating improved velocity and safety with ambulation.     Time  8    Period  Weeks    Status  New    Target Date  06/09/17            Plan - 04/15/17 1226    Clinical Impression Statement  Reviewed goals and copy of eval given to pt and daughter.  Did not complete the abuse/language questions as pt was not alone during session.  This session focus on LE strengthening and flexibility exercises to address weakness and mobility, established HEP.  Pt required max cueing for proper form and technique especially with SLRs, keeping knee straight during motion.  Daughter verbalized understanding wiht exercise and reports she will assist with these exercises.  Pt educated on importance of posture during gait to assist with balance and back pain control, reports decreased pain at EOS.      Rehab Potential  Fair    Clinical Impairments Affecting Rehab Potential  Positive: support system, motivated; Negative: progressive nature of PD; comorbidities     PT Frequency  2x / week    PT Duration  8 weeks    PT Treatment/Interventions  ADLs/Self  Care Home Management;Aquatic Therapy;Moist Heat;DME Instruction;Gait training;Stair training;Functional mobility training;Therapeutic activities;Therapeutic exercise;Balance training;Neuromuscular re-education;Patient/family education;Orthotic  Fit/Training;Manual techniques;Cryotherapy;Passive range of motion;Dry needling;Energy conservation;Taping;Visual/perceptual remediation/compensation    PT Next Visit Plan  Next session review compliance and technique wiht HEP.  ask abuse literacy/language questions; Next session begin reciprocal movement exercises and nustep.  Initiate balance training and gait training consider incorporating metronome to assist with gait speed    PT Home Exercise Plan  04/15/17: Supine HS stretch, bridges and supine SLR.      Consulted and Agree with Plan of Care  Patient;Family member/caregiver    Family Member Consulted  Patient's daughter       Patient will benefit from skilled therapeutic intervention in order to improve the following deficits and impairments:  Abnormal gait, Decreased balance, Decreased endurance, Decreased mobility, Difficulty walking, Improper body mechanics, Decreased activity tolerance, Decreased coordination, Decreased strength, Impaired flexibility, Postural dysfunction, Pain  Visit Diagnosis: Unsteadiness on feet  Other abnormalities of gait and mobility  Repeated falls  Other symptoms and signs involving the musculoskeletal system     Problem List Patient Active Problem List   Diagnosis Date Noted  . Syncope 10/12/2014  . Diabetes ([place]) 10/12/2014  . Hypertension 10/12/2014  . OSTEOARTHRITIS, 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  . TRIGGER FINGER 04/27/2007   Ihor Austin, B[place]; [place]  Aldona Lento 04/15/2017, 12:34 PM  [facility] [address] [place], [place], [address] Phone: [phone]   Fax:  [phone]  Name: [name] MRN: [id number] Date of Birth: 1940/07/15

## 2017-04-20 ENCOUNTER — Encounter (HOSPITAL_COMMUNITY): Payer: Self-pay

## 2017-04-20 ENCOUNTER — Ambulatory Visit (HOSPITAL_COMMUNITY): Payer: Medicare Other

## 2017-04-20 DIAGNOSIS — R2681 Unsteadiness on feet: Secondary | ICD-10-CM | POA: Diagnosis not present

## 2017-04-20 DIAGNOSIS — R296 Repeated falls: Secondary | ICD-10-CM

## 2017-04-20 DIAGNOSIS — R29898 Other symptoms and signs involving the musculoskeletal system: Secondary | ICD-10-CM

## 2017-04-20 DIAGNOSIS — R2689 Other abnormalities of gait and mobility: Secondary | ICD-10-CM

## 2017-04-20 NOTE — Therapy (Signed)
[place], [place], [address] Phone: [phone]   Fax:  [phone]  Physical Therapy Treatment  Patient Details  Name: [name] MRN: [id number] Date of Birth: 10-06-1940 Referring Provider: Phillips Odor MD   Encounter Date: 04/20/2017  PT End of Session - 04/20/17 1359    Visit Number  3    Number of Visits  17    Date for PT Re-Evaluation  06/09/17 Minireassess 05/12/2017    Authorization Type  UHC Medicare; Secondary: Medicaid [place] Time Period  04/14/17 - 06/09/17    Authorization - Visit Number  3    Authorization - Number of Visits  10 reassess every 10 visit/ FOTO    PT Start Time  0932 Began on Nustep for reciprocal movement, no included in charges    PT Stop Time  1435    PT Time Calculation (min)  45 min    Equipment Utilized During Treatment  Gait belt    Activity Tolerance  Patient tolerated treatment well;No increased pain    Behavior During Therapy  WFL for tasks assessed/performed       Past Medical History:  Diagnosis Date  . Diabetes mellitus   . GERD (gastroesophageal reflux disease)   . Hypertension   . Parkinson disease ([place])   . Scoliosis   . Scoliosis   . UTI (lower urinary tract infection)     Past Surgical History:  Procedure Laterality Date  . boil Left    boil removed from left cheek  . EYE SURGERY     Retina Detachment  . MASS EXCISION N/A 06/27/2014   Procedure: EXCISION SUBMANDIBULAR STONE;  Surgeon: Izora Gala, MD;  Location: [facility] OR;  Service: ENT;  Laterality: N/A;  . sublingual gland removal  2016  . 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  Subjective Assessment - 04/20/17 1357    Subjective  Pt stated she has began HEP 2x daily without questions.  No reports of recent falls.  Continues to be limited with LBP 9/10 achey pain.      Pertinent History  HTN; Parkinson's Disease; DMII; CKD Stage III    Patient  Stated Goals  Improve balance and walking     Currently in Pain?  Yes    Pain Score  9     Pain Location  Back    Pain Orientation  Lower    Pain Descriptors / Indicators  Aching    Pain Type  Chronic pain    Pain Onset  More than a month ago    Pain Frequency  Intermittent    Aggravating Factors   standing/ walking    Pain Relieving Factors  sitting                       [facility] Adult PT Treatment/Exercise - 04/20/17 0001      Knee/Hip Exercises: Stretches   Active Hamstring Stretch  3 reps;30 seconds    Active Hamstring Stretch Limitations  supine with sheet      Knee/Hip Exercises: Aerobic   Nustep  5' reciprocal UE/LE L1 SPM 35      Knee/Hip Exercises: Standing   Gait Training  51ft x 2 with min cueing for posture and to increase stride length, good sequence with SPC this session    Other Standing Knee Exercises  NBOS with UE flexion  10x    Other Standing Knee Exercises  alternating marching 3x (c/o LBP so moved to seated dynadisc)      Knee/Hip Exercises: Seated   Long Arc Quad  Both;10 reps alternating    Marching  Both;10 reps;Limitations    Marching Limitations  alternating LE marching sitting on dynadisc      Knee/Hip Exercises: Supine   Bridges  10 reps    Straight Leg Raises  10 reps    Straight Leg Raises Limitations  min cueing to complete with LE extended               PT Short Term Goals - 04/14/17 1619      PT SHORT TERM GOAL #1   Title  Patient and patient's family will demonstrate understanding and report regular compliance with home exercise program to improve patient's balance, flexibility, and strength.     Time  4    Period  Weeks    Status  New    Target Date  05/12/17      PT SHORT TERM GOAL #2   Title  Patient will demonstrate improvement in MMT strength of 1/2 grade in all tested musculature deficient at evalution to assist patient with ambulation and other fucntional mobility.     Time  4    Period  Weeks    Status   New    Target Date  05/12/17      PT SHORT TERM GOAL #3   Title  Patient will demonstrate ability to maintain single limb stance for 3 seconds on each lower extremity to assist with stair ambulation.     Time  4    Period  Weeks    Status  New    Target Date  05/12/17        PT Long Term Goals - 04/14/17 1623      PT LONG TERM GOAL #1   Title  Patient will demonstrate improvement in MMT strength of 1 grade in all tested musculature deficient at evalution to assist patient with ambulation and other fucntional mobility.     Time  8    Period  Weeks    Status  New    Target Date  06/09/17      PT LONG TERM GOAL #2   Title  Patient will demonstrate improvement in ability to perform 5 x sit to stand by 10 seconds indicating improvement in balance and functional mobility.     Time  8    Period  Weeks    Status  New    Target Date  06/09/17      PT LONG TERM GOAL #3   Title  Patient will demonstate ability to ambulate an improvement of 100 feet on the 3 minute walk test demonstrating improved velocity and safety with ambulation.     Time  8    Period  Weeks    Status  New    Target Date  06/09/17            Plan - 04/20/17 1409    Clinical Impression Statement  Began session on Nustep to improve UE/LE reciprocal movements.  Abuse/literacy questions asked in private during gait training.  Daughter did attend the session with patient today.  Reviewed form with HEP to assure correct form, improved ability to follow cues today.  Session focus on improving reciprocal movements and core/balance training.  Pt c/o increased LBP wiht standing marching so completed seated on dynadisc for dynamic  core strengthening, min cueing to improce reciprocal movements.  Pt able to complete gait training with SPC with good sequence and min cueing to increase stride length to normalize gait mechanics.  No reports of increased pain through session, was limited by fatigue.    Rehab Potential  Fair     Clinical Impairments Affecting Rehab Potential  Positive: support system, motivated; Negative: progressive nature of PD; comorbidities     PT Frequency  2x / week    PT Duration  8 weeks    PT Treatment/Interventions  ADLs/Self Care Home Management;Aquatic Therapy;Moist Heat;DME Instruction;Gait training;Stair training;Functional mobility training;Therapeutic activities;Therapeutic exercise;Balance training;Neuromuscular re-education;Patient/family education;Orthotic Fit/Training;Manual techniques;Cryotherapy;Passive range of motion;Dry needling;Energy conservation;Taping;Visual/perceptual remediation/compensation    PT Next Visit Plan  Next session begin reciprocal movement exercises and nustep.  Initiate balance training and gait training consider incorporating metronome to assist with gait speed.  If limited by LBP complete seated activities on dynadisc.      PT Home Exercise Plan  04/15/17: Supine HS stretch, bridges and supine SLR.         Patient will benefit from skilled therapeutic intervention in order to improve the following deficits and impairments:  Abnormal gait, Decreased balance, Decreased endurance, Decreased mobility, Difficulty walking, Improper body mechanics, Decreased activity tolerance, Decreased coordination, Decreased strength, Impaired flexibility, Postural dysfunction, Pain  Visit Diagnosis: Unsteadiness on feet  Other abnormalities of gait and mobility  Repeated falls  Other symptoms and signs involving the musculoskeletal system     Problem List Patient Active Problem List   Diagnosis Date Noted  . Syncope 10/12/2014  . Diabetes ([place]) 10/12/2014  . Hypertension 10/12/2014  . OSTEOARTHRITIS, 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  . TRIGGER FINGER 04/27/2007   Ihor Austin, [place]; [place]  Aldona Lento 04/20/2017, 7:16 PM  [place], [place], [address] Phone: [phone]   Fax:   [phone]  Name: [name] MRN: [id number] Date of Birth: 08-Feb-1940

## 2017-04-21 DIAGNOSIS — R2689 Other abnormalities of gait and mobility: Secondary | ICD-10-CM | POA: Diagnosis not present

## 2017-04-21 DIAGNOSIS — G2111 Neuroleptic induced parkinsonism: Secondary | ICD-10-CM | POA: Diagnosis not present

## 2017-04-21 DIAGNOSIS — E1121 Type 2 diabetes mellitus with diabetic nephropathy: Secondary | ICD-10-CM | POA: Diagnosis not present

## 2017-04-21 DIAGNOSIS — K3184 Gastroparesis: Secondary | ICD-10-CM | POA: Diagnosis not present

## 2017-04-22 ENCOUNTER — Ambulatory Visit (HOSPITAL_COMMUNITY): Payer: Medicare Other | Admitting: Physical Therapy

## 2017-04-22 DIAGNOSIS — R2681 Unsteadiness on feet: Secondary | ICD-10-CM

## 2017-04-22 DIAGNOSIS — R2689 Other abnormalities of gait and mobility: Secondary | ICD-10-CM | POA: Diagnosis not present

## 2017-04-22 DIAGNOSIS — R296 Repeated falls: Secondary | ICD-10-CM

## 2017-04-22 DIAGNOSIS — R29898 Other symptoms and signs involving the musculoskeletal system: Secondary | ICD-10-CM | POA: Diagnosis not present

## 2017-04-22 NOTE — Therapy (Signed)
[place], [place], [address] Phone: [phone]   Fax:  [phone]  Physical Therapy Treatment  Patient Details  Name: [name] MRN: [id number] Date of Birth: 05-20-1940 Referring Provider: Phillips Odor MD   Encounter Date: 04/22/2017  PT End of Session - 04/22/17 1300    Visit Number  4    Number of Visits  17    Date for PT Re-Evaluation  06/09/17 Minireassess 05/12/2017    Authorization Type  UHC Medicare; Secondary: Medicaid [place]     Authorization Time Period  04/14/17 - 06/09/17    Authorization - Visit Number  4    Authorization - Number of Visits  10 reassess every 10 visit/ FOTO    PT Start Time  1118    PT Stop Time  1200    PT Time Calculation (min)  42 min    Equipment Utilized During Treatment  Gait belt    Activity Tolerance  Patient tolerated treatment well;No increased pain    Behavior During Therapy  WFL for tasks assessed/performed       Past Medical History:  Diagnosis Date  . Diabetes mellitus   . GERD (gastroesophageal reflux disease)   . Hypertension   . Parkinson disease ([place])   . Scoliosis   . Scoliosis   . UTI (lower urinary tract infection)     Past Surgical History:  Procedure Laterality Date  . boil Left    boil removed from left cheek  . EYE SURGERY     Retina Detachment  . MASS EXCISION N/A 06/27/2014   Procedure: EXCISION SUBMANDIBULAR STONE;  Surgeon: Izora Gala, MD;  Location: [facility] OR;  Service: ENT;  Laterality: N/A;  . sublingual gland removal  2016  . 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  Subjective Assessment - 04/22/17 1121    Subjective  Pt states her back is hurting her today at 9/10.  No other issues.  Walking forward flexed with SPC.  STates she returned to referring MD yesterday and he was pleased with progress.    Currently in Pain?  Yes    Pain Score  9     Pain Location  Back    Pain Orientation  Lower    Pain Descriptors / Indicators  Aching    Pain Type  Chronic pain    Pain Frequency  Intermittent                       [facility] Adult PT Treatment/Exercise - 04/22/17 0001      Knee/Hip Exercises: Standing   Other Standing Knee Exercises  NBOS with UE flexion 10x    Other Standing Knee Exercises  alternating marching 10x with UE assist      Knee/Hip Exercises: Seated   Long Arc Quad  Both;10 reps    Other Seated Knee/Hip Exercises  stepping over 12" hurdle and reaching same side for cone 10 reps each side.    Marching  Both;10 reps;Limitations    Marching Limitations  alternating LE marching sitting on dynadisc    Sit to [employer]  --      Knee/Hip Exercises: Supine   Bridges  10 reps    Straight Leg Raises  10 reps    Straight Leg Raises Limitations  min cueing to complete with LE extended and eccentric control  PT Short Term Goals - 04/14/17 1619      PT SHORT TERM GOAL #1   Title  Patient and patient's family will demonstrate understanding and report regular compliance with home exercise program to improve patient's balance, flexibility, and strength.     Time  4    Period  Weeks    Status  New    Target Date  05/12/17      PT SHORT TERM GOAL #2   Title  Patient will demonstrate improvement in MMT strength of 1/2 grade in all tested musculature deficient at evalution to assist patient with ambulation and other fucntional mobility.     Time  4    Period  Weeks    Status  New    Target Date  05/12/17      PT SHORT TERM GOAL #3   Title  Patient will demonstrate ability to maintain single limb stance for 3 seconds on each lower extremity to assist with stair ambulation.     Time  4    Period  Weeks    Status  New    Target Date  05/12/17        PT Long Term Goals - 04/14/17 1623      PT LONG TERM GOAL #1   Title  Patient will demonstrate improvement in MMT strength of 1 grade in all tested musculature deficient at evalution to assist  patient with ambulation and other fucntional mobility.     Time  8    Period  Weeks    Status  New    Target Date  06/09/17      PT LONG TERM GOAL #2   Title  Patient will demonstrate improvement in ability to perform 5 x sit to stand by 10 seconds indicating improvement in balance and functional mobility.     Time  8    Period  Weeks    Status  New    Target Date  06/09/17      PT LONG TERM GOAL #3   Title  Patient will demonstate ability to ambulate an improvement of 100 feet on the 3 minute walk test demonstrating improved velocity and safety with ambulation.     Time  8    Period  Weeks    Status  New    Target Date  06/09/17            Plan - 04/22/17 1301    Clinical Impression Statement  Continued with focus on improving steppage, posture and balance.  Pt requires constant cues to stand more erect and focus on task.  Able to complete standing march today without complaints.  Added seated alternating UE/LE task today using hurles and cone. Pt able to complete with verbal/tactile cues.  SLR remains most diffiucult as her LE's are weak and has poor eccentric control.      Rehab Potential  Fair    Clinical Impairments Affecting Rehab Potential  Positive: support system, motivated; Negative: progressive nature of PD; comorbidities     PT Frequency  2x / week    PT Duration  8 weeks    PT Treatment/Interventions  ADLs/Self Care Home Management;Aquatic Therapy;Moist Heat;DME Instruction;Gait training;Stair training;Functional mobility training;Therapeutic activities;Therapeutic exercise;Balance training;Neuromuscular re-education;Patient/family education;Orthotic Fit/Training;Manual techniques;Cryotherapy;Passive range of motion;Dry needling;Energy conservation;Taping;Visual/perceptual remediation/compensation    PT Next Visit Plan  Next session continue reciprocal movement exercises and nustep.  Consider incorporating metronome to assist with gait speed.  If limited by LBP complete  seated activities  on dynadisc.  Begin sit to stand.      PT Home Exercise Plan  04/15/17: Supine HS stretch, bridges and supine SLR.         Patient will benefit from skilled therapeutic intervention in order to improve the following deficits and impairments:  Abnormal gait, Decreased balance, Decreased endurance, Decreased mobility, Difficulty walking, Improper body mechanics, Decreased activity tolerance, Decreased coordination, Decreased strength, Impaired flexibility, Postural dysfunction, Pain  Visit Diagnosis: Unsteadiness on feet  Other abnormalities of gait and mobility  Repeated falls  Other symptoms and signs involving the musculoskeletal system     Problem List Patient Active Problem List   Diagnosis Date Noted  . Syncope 10/12/2014  . Diabetes ([place]) 10/12/2014  . Hypertension 10/12/2014  . OSTEOARTHRITIS, 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  . TRIGGER FINGER 04/27/2007   Teena Irani, PTA/CLT [phone]  Teena Irani 04/22/2017, 1:05 PM  [place] [address] [place], [place], [address] Phone: [phone]   Fax:  [phone]  Name: [name] MRN: [id number] Date of Birth: 1940-06-30

## 2017-04-28 ENCOUNTER — Encounter (HOSPITAL_COMMUNITY): Payer: Self-pay | Admitting: Physical Therapy

## 2017-04-28 ENCOUNTER — Ambulatory Visit (HOSPITAL_COMMUNITY): Payer: Medicare Other | Admitting: Physical Therapy

## 2017-04-28 DIAGNOSIS — R29898 Other symptoms and signs involving the musculoskeletal system: Secondary | ICD-10-CM

## 2017-04-28 DIAGNOSIS — R2689 Other abnormalities of gait and mobility: Secondary | ICD-10-CM

## 2017-04-28 DIAGNOSIS — R2681 Unsteadiness on feet: Secondary | ICD-10-CM | POA: Diagnosis not present

## 2017-04-28 DIAGNOSIS — R296 Repeated falls: Secondary | ICD-10-CM | POA: Diagnosis not present

## 2017-04-28 NOTE — Therapy (Signed)
[place], [place], [address] Phone: [phone]   Fax:  [phone]  Physical Therapy Treatment  Patient Details  Name: [name] MRN: [id number] Date of Birth: 07-08-1940 Referring Provider: Phillips Odor MD   Encounter Date: 04/28/2017  PT End of Session - 04/28/17 1211    Visit Number  5    Number of Visits  17    Date for PT Re-Evaluation  06/09/17 Minireassess 05/12/2017    Authorization Type  UHC Medicare; Secondary: Medicaid [place] Time Period  04/14/17 - 06/09/17    Authorization - Visit Number  5    Authorization - Number of Visits  10 reassess every 10 visit/ FOTO    PT Start Time  9892    PT Stop Time  1202    PT Time Calculation (min)  41 min    Equipment Utilized During Treatment  Gait belt    Activity Tolerance  Patient tolerated treatment well;No increased pain    Behavior During Therapy  WFL for tasks assessed/performed       Past Medical History:  Diagnosis Date  . Diabetes mellitus   . GERD (gastroesophageal reflux disease)   . Hypertension   . Parkinson disease ([place])   . Scoliosis   . Scoliosis   . UTI (lower urinary tract infection)     Past Surgical History:  Procedure Laterality Date  . boil Left    boil removed from left cheek  . EYE SURGERY     Retina Detachment  . MASS EXCISION N/A 06/27/2014   Procedure: EXCISION SUBMANDIBULAR STONE;  Surgeon: Izora Gala, MD;  Location: [facility] OR;  Service: ENT;  Laterality: N/A;  . sublingual gland removal  2016  . 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  Subjective Assessment - 04/28/17 1130    Subjective  Patient reported 9/10 back pain at this session. Patient reported doing her exercises and reported no medical changes.     Patient is accompained by:  Family member Patient's daughter    Pertinent History  HTN; Parkinson's Disease; DMII; CKD Stage III    Limitations   Standing;Walking;House hold activities    How long can you sit comfortably?  unlimted    How long can you stand comfortably?  10-15 minutes    How long can you walk comfortably?  5 minutes    Patient Stated Goals  Improve balance and walking     Currently in Pain?  Yes    Pain Score  9     Pain Location  Back    Pain Orientation  Lower    Pain Descriptors / Indicators  Aching    Pain Type  Chronic pain    Pain Onset  More than a month ago    Pain Frequency  Intermittent                       [facility] Adult PT Treatment/Exercise - 04/28/17 0001      Ambulation/Gait   Ambulation Distance (Feet)  205 Feet 100 feet; seated rest break and then 105 feet    Assistive device  Straight cane    Gait Pattern  Decreased arm swing - right;Decreased arm swing - left;Decreased stride length;Decreased hip/knee flexion - right;Decreased hip/knee flexion - left;Decreased dorsiflexion - right;Decreased dorsiflexion - left;Trunk flexed    Ambulation Surface  Level  Gait Comments  Patient ambulated at a slow gait velocity, was cued using a metronome to increase gait speed up to 60 bpm at end of gait training.      Knee/Hip Exercises: Stretches   Other Knee/Hip Stretches  Double knee to chest 3 x 20 seconds to improve trunk mobility with ambulation; Knee fall-outs to improve trunk mobility and improve movement with walking      Knee/Hip Exercises: Standing   Other Standing Knee Exercises  NBOS with UE flexion 10x    Other Standing Knee Exercises  alternating marching 10x with UE assist      Knee/Hip Exercises: Seated   Long Arc Quad  Both;10 reps    Other Seated Knee/Hip Exercises  stepping over 12" hurdle and reaching same side for cone 10 reps each side.    Marching  Both;10 reps;Limitations    Marching Limitations  alternating LE marching sitting on dynadisc    Sit to [place]  1 set;10 reps;without UE support From standard height chair, some momentum strategy noted      Knee/Hip  Exercises: Supine   Bridges  10 reps Review for HEP. Partial ROM.     Straight Leg Raises  10 reps    Straight Leg Raises Limitations  min cueing to complete with LE extended and eccentric control          Balance Exercises - 04/28/17 1212      Balance Exercises: Standing   Tandem Stance  Eyes open;Foam/compliant surface;1 rep;30 secs;Intermittent upper extremity support;Other (comment) Each lower extremity        PT Education - 04/28/17 1218    Education provided  Yes    Education Details  Patient was educated on purpose and technique of exercises throughout session.     Person(s) Educated  Patient    Methods  Explanation;Demonstration;Tactile cues;Verbal cues    Comprehension  Verbalized understanding;Returned demonstration;Verbal cues required;Tactile cues required;Need further instruction       PT Short Term Goals - 04/14/17 1619      PT SHORT TERM GOAL #1   Title  Patient and patient's family will demonstrate understanding and report regular compliance with home exercise program to improve patient's balance, flexibility, and strength.     Time  4    Period  Weeks    Status  New    Target Date  05/12/17      PT SHORT TERM GOAL #2   Title  Patient will demonstrate improvement in MMT strength of 1/2 grade in all tested musculature deficient at evalution to assist patient with ambulation and other fucntional mobility.     Time  4    Period  Weeks    Status  New    Target Date  05/12/17      PT SHORT TERM GOAL #3   Title  Patient will demonstrate ability to maintain single limb stance for 3 seconds on each lower extremity to assist with stair ambulation.     Time  4    Period  Weeks    Status  New    Target Date  05/12/17        PT Long Term Goals - 04/14/17 1623      PT LONG TERM GOAL #1   Title  Patient will demonstrate improvement in MMT strength of 1 grade in all tested musculature deficient at evalution to assist patient with ambulation and other  fucntional mobility.     Time  8    Period  Weeks    Status  New    Target Date  06/09/17      PT LONG TERM GOAL #2   Title  Patient will demonstrate improvement in ability to perform 5 x sit to stand by 10 seconds indicating improvement in balance and functional mobility.     Time  8    Period  Weeks    Status  New    Target Date  06/09/17      PT LONG TERM GOAL #3   Title  Patient will demonstate ability to ambulate an improvement of 100 feet on the 3 minute walk test demonstrating improved velocity and safety with ambulation.     Time  8    Period  Weeks    Status  New    Target Date  06/09/17            Plan - 04/28/17 1212    Clinical Impression Statement  This session continued to progress patient with lower extremity strengthening exercises, balance exercises and gait exercises. This session added trunk stretches to decrease rigidity noted during ambulation. Patient also performed sit to stands this session from a standard height chair. This session also added tandem stance on foam to challenge patient's balance. With gait training patient was able to ambulate 100 feet and then 105 feet. Therapist used a metronome during gait training to improve patient's gait speed and patient was able to ambulate at 60 bpm by the end of gait training. Patient continued to require tactile and verbal cues throughout exercises. Patient would benefit from continued skilled physical therapy in order to progress patient with balance, gait training, strengthening, and overall functional mobility.     Rehab Potential  Fair    Clinical Impairments Affecting Rehab Potential  Positive: support system, motivated; Negative: progressive nature of PD; comorbidities     PT Frequency  2x / week    PT Duration  8 weeks    PT Treatment/Interventions  ADLs/Self Care Home Management;Aquatic Therapy;Moist Heat;DME Instruction;Gait training;Stair training;Functional mobility training;Therapeutic  activities;Therapeutic exercise;Balance training;Neuromuscular re-education;Patient/family education;Orthotic Fit/Training;Manual techniques;Cryotherapy;Passive range of motion;Dry needling;Energy conservation;Taping;Visual/perceptual remediation/compensation    PT Next Visit Plan  Next session continue reciprocal movement exercises and nustep.  Consider continuing metronome to assist with gait speed.  If limited by LBP complete seated activities on dynadisc.  Continue trunk stretches to decrease rigidity and improve trunk mobility with gait.      PT Home Exercise Plan  04/15/17: Supine HS stretch, bridges and supine SLR.      Consulted and Agree with Plan of Care  Patient       Patient will benefit from skilled therapeutic intervention in order to improve the following deficits and impairments:  Abnormal gait, Decreased balance, Decreased endurance, Decreased mobility, Difficulty walking, Improper body mechanics, Decreased activity tolerance, Decreased coordination, Decreased strength, Impaired flexibility, Postural dysfunction, Pain  Visit Diagnosis: Unsteadiness on feet  Other abnormalities of gait and mobility  Repeated falls  Other symptoms and signs involving the musculoskeletal system     Problem List Patient Active Problem List   Diagnosis Date Noted  . Syncope 10/12/2014  . Diabetes ([place]) 10/12/2014  . Hypertension 10/12/2014  . OSTEOARTHRITIS, 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  . TRIGGER FINGER 04/27/2007   Clarene Critchley PT, DPT 12:20 PM, 04/28/17 [place], [place], [address] Phone: [phone]   Fax:  [phone]  Name: [name] MRN: [id number] Date of Birth: 03-19-1940

## 2017-04-30 ENCOUNTER — Telehealth (HOSPITAL_COMMUNITY): Payer: Self-pay | Admitting: Physical Therapy

## 2017-04-30 ENCOUNTER — Ambulatory Visit (HOSPITAL_COMMUNITY): Payer: Medicare Other | Admitting: Physical Therapy

## 2017-04-30 NOTE — Telephone Encounter (Signed)
Patient just got out of bed and wont be able to make it I offered her a appt after lunch and she prefer to wait until next week to come in.

## 2017-05-05 ENCOUNTER — Encounter (HOSPITAL_COMMUNITY): Payer: Self-pay | Admitting: Physical Therapy

## 2017-05-05 ENCOUNTER — Ambulatory Visit (HOSPITAL_COMMUNITY): Payer: Medicare Other | Attending: Neurology | Admitting: Physical Therapy

## 2017-05-05 DIAGNOSIS — R29898 Other symptoms and signs involving the musculoskeletal system: Secondary | ICD-10-CM | POA: Insufficient documentation

## 2017-05-05 DIAGNOSIS — R2689 Other abnormalities of gait and mobility: Secondary | ICD-10-CM | POA: Insufficient documentation

## 2017-05-05 DIAGNOSIS — R2681 Unsteadiness on feet: Secondary | ICD-10-CM | POA: Diagnosis not present

## 2017-05-05 DIAGNOSIS — R296 Repeated falls: Secondary | ICD-10-CM | POA: Diagnosis not present

## 2017-05-05 NOTE — Therapy (Signed)
[place], [place], [address] Phone: [phone]   Fax:  [phone]  Physical Therapy Treatment  Patient Details  Name: [name] MRN: [id number] Date of Birth: January 04, 1941 Referring Provider: Phillips Odor MD   Encounter Date: 05/05/2017  PT End of Session - 05/05/17 1407    Visit Number  6    Number of Visits  17    Date for PT Re-Evaluation  06/09/17 Minireassess 05/12/2017    Authorization Type  UHC Medicare; Secondary: Medicaid [place] Time Period  04/14/17 - 06/09/17    Authorization - Visit Number  6    Authorization - Number of Visits  10 reassess every 10 visit/ FOTO    PT Start Time  1300    PT Stop Time  1345    PT Time Calculation (min)  45 min    Equipment Utilized During Treatment  Gait belt    Activity Tolerance  Patient tolerated treatment well;No increased pain    Behavior During Therapy  WFL for tasks assessed/performed       Past Medical History:  Diagnosis Date  . Diabetes mellitus   . GERD (gastroesophageal reflux disease)   . Hypertension   . Parkinson disease ([place])   . Scoliosis   . Scoliosis   . UTI (lower urinary tract infection)     Past Surgical History:  Procedure Laterality Date  . boil Left    boil removed from left cheek  . EYE SURGERY     Retina Detachment  . MASS EXCISION N/A 06/27/2014   Procedure: EXCISION SUBMANDIBULAR STONE;  Surgeon: Izora Gala, MD;  Location: [facility] OR;  Service: ENT;  Laterality: N/A;  . sublingual gland removal  2016  . 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  Subjective Assessment - 05/05/17 1306    Subjective  Patient stated she had a fall on Sunday. She said she doens't have any swelling and that she was able to walk after the fall. Patient stated that since she fell she has had some leg soreness. Patient stated that she has been doing her exercises at home.     Patient is accompained by:   Family member Patient's daughter    Pertinent History  HTN; Parkinson's Disease; DMII; CKD Stage III    Limitations  Standing;Walking;House hold activities    How long can you sit comfortably?  unlimted    How long can you stand comfortably?  10-15 minutes    How long can you walk comfortably?  5 minutes    Patient Stated Goals  Improve balance and walking     Currently in Pain?  Yes    Pain Score  9     Pain Location  Leg    Pain Orientation  Right    Pain Descriptors / Indicators  Sore    Pain Type  Acute pain    Pain Onset  In the past 7 days    Pain Frequency  Intermittent    Multiple Pain Sites  No                       [facility] Adult PT Treatment/Exercise - 05/05/17 0001      Ambulation/Gait   Ambulation Distance (Feet)  104 Feet    Assistive device  Straight cane    Gait Pattern  Decreased arm swing - right;Decreased  arm swing - left;Decreased stride length;Decreased hip/knee flexion - right;Decreased hip/knee flexion - left;Decreased dorsiflexion - right;Decreased dorsiflexion - left;Trunk flexed    Ambulation Surface  Level    Gait Comments  Patient ambulated at a slow gait velocity, was cued using a metronome to increase gait speed up to 64 bpm at end of gait training.      Lumbar Exercises: Seated   Other Seated Lumbar Exercises  Trunk rotation with upper extremity reach x 20 to loosen trunk and increase rotation. Lumbar flexion to upright sitting with upper extremity flexion x 20       Knee/Hip Exercises: Stretches   Other Knee/Hip Stretches  Double knee to chest 3 x 20 seconds to improve trunk mobility with ambulation; Knee fall-outs to improve trunk mobility and improve movement with walking      Knee/Hip Exercises: Aerobic   Nustep  6' reciprocal UE/LE L1 SPM 35 For improved reciprocal movement with ambulation      Knee/Hip Exercises: Seated   Other Seated Knee/Hip Exercises  stepping over 6" hurdle and reaching same side for cone 10 reps each side.       Knee/Hip Exercises: Supine   Other Supine Knee/Hip Exercises  Clamshells with redtheraband around knees 2 x10               PT Short Term Goals - 04/14/17 1619      PT SHORT TERM GOAL #1   Title  Patient and patient's family will demonstrate understanding and report regular compliance with home exercise program to improve patient's balance, flexibility, and strength.     Time  4    Period  Weeks    Status  New    Target Date  05/12/17      PT SHORT TERM GOAL #2   Title  Patient will demonstrate improvement in MMT strength of 1/2 grade in all tested musculature deficient at evalution to assist patient with ambulation and other fucntional mobility.     Time  4    Period  Weeks    Status  New    Target Date  05/12/17      PT SHORT TERM GOAL #3   Title  Patient will demonstrate ability to maintain single limb stance for 3 seconds on each lower extremity to assist with stair ambulation.     Time  4    Period  Weeks    Status  New    Target Date  05/12/17        PT Long Term Goals - 04/14/17 1623      PT LONG TERM GOAL #1   Title  Patient will demonstrate improvement in MMT strength of 1 grade in all tested musculature deficient at evalution to assist patient with ambulation and other fucntional mobility.     Time  8    Period  Weeks    Status  New    Target Date  06/09/17      PT LONG TERM GOAL #2   Title  Patient will demonstrate improvement in ability to perform 5 x sit to stand by 10 seconds indicating improvement in balance and functional mobility.     Time  8    Period  Weeks    Status  New    Target Date  06/09/17      PT LONG TERM GOAL #3   Title  Patient will demonstate ability to ambulate an improvement of 100 feet on the 3 minute walk test demonstrating  improved velocity and safety with ambulation.     Time  8    Period  Weeks    Status  New    Target Date  06/09/17            Plan - 05/05/17 1408    Clinical Impression Statement  This  session patient reported that she had a fall on Sunday. She stated that she does not think anything got injured, but that she is just sore. Therapist informed patient that if continues to have persistent soreness she should go to the physician to get it checked out. This session added trunk flexion/extension with upper extremity reaching and trunk rotation with upper extremity reaching. Plan to follow-up if patient goes to the physician.     Rehab Potential  Fair    Clinical Impairments Affecting Rehab Potential  Positive: support system, motivated; Negative: progressive nature of PD; comorbidities     PT Frequency  2x / week    PT Duration  8 weeks    PT Treatment/Interventions  ADLs/Self Care Home Management;Aquatic Therapy;Moist Heat;DME Instruction;Gait training;Stair training;Functional mobility training;Therapeutic activities;Therapeutic exercise;Balance training;Neuromuscular re-education;Patient/family education;Orthotic Fit/Training;Manual techniques;Cryotherapy;Passive range of motion;Dry needling;Energy conservation;Taping;Visual/perceptual remediation/compensation    PT Next Visit Plan  Follow-up about physician. Consider continuing metronome to assist with gait speed.  If limited by LBP complete seated activities on dynadisc.  Continue trunk stretches to decrease rigidity and improve trunk mobility with gait.      PT Home Exercise Plan  04/15/17: Supine HS stretch, bridges and supine SLR.      Consulted and Agree with Plan of Care  Patient       Patient will benefit from skilled therapeutic intervention in order to improve the following deficits and impairments:  Abnormal gait, Decreased balance, Decreased endurance, Decreased mobility, Difficulty walking, Improper body mechanics, Decreased activity tolerance, Decreased coordination, Decreased strength, Impaired flexibility, Postural dysfunction, Pain  Visit Diagnosis: Unsteadiness on feet  Other abnormalities of gait and  mobility  Repeated falls  Other symptoms and signs involving the musculoskeletal system     Problem List Patient Active Problem List   Diagnosis Date Noted  . Syncope 10/12/2014  . Diabetes ([place]) 10/12/2014  . Hypertension 10/12/2014  . OSTEOARTHRITIS, 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  . TRIGGER FINGER 04/27/2007   [name] PT, DPT 2:21 PM, 05/05/17 [place], [place], [address] Phone: [phone]   Fax:  [phone]  Name: [name] MRN: [id number] Date of Birth: 1940-11-08

## 2017-05-07 ENCOUNTER — Ambulatory Visit (HOSPITAL_COMMUNITY): Payer: Medicare Other

## 2017-05-07 ENCOUNTER — Encounter (HOSPITAL_COMMUNITY): Payer: Self-pay

## 2017-05-07 DIAGNOSIS — R2681 Unsteadiness on feet: Secondary | ICD-10-CM

## 2017-05-07 DIAGNOSIS — R2689 Other abnormalities of gait and mobility: Secondary | ICD-10-CM

## 2017-05-07 DIAGNOSIS — R296 Repeated falls: Secondary | ICD-10-CM | POA: Diagnosis not present

## 2017-05-07 DIAGNOSIS — R29898 Other symptoms and signs involving the musculoskeletal system: Secondary | ICD-10-CM

## 2017-05-07 NOTE — Therapy (Signed)
[place], [place], [address] Phone: [phone]   Fax:  [phone]  Physical Therapy Treatment  Patient Details  Name: [name] MRN: [id number] Date of Birth: 1940/05/18 Referring Provider: Phillips Odor MD   Encounter Date: 05/07/2017  PT End of Session - 05/07/17 1353    Visit Number  7    Number of Visits  17    Date for PT Re-Evaluation  06/09/17 Mini-reassess 05/12/17    Authorization Type  UHC Medicare; Secondary: Medicaid [place] Time Period  04/14/17 - 06/09/17    Authorization - Visit Number  7    Authorization - Number of Visits  10    PT Start Time  6294 6' on Nustep, no charge    PT Stop Time  1431    PT Time Calculation (min)  45 min    Equipment Utilized During Treatment  Gait belt    Activity Tolerance  Patient tolerated treatment well;No increased pain    Behavior During Therapy  WFL for tasks assessed/performed       Past Medical History:  Diagnosis Date  . Diabetes mellitus   . GERD (gastroesophageal reflux disease)   . Hypertension   . Parkinson disease ([place])   . Scoliosis   . Scoliosis   . UTI (lower urinary tract infection)     Past Surgical History:  Procedure Laterality Date  . boil Left    boil removed from left cheek  . EYE SURGERY     Retina Detachment  . MASS EXCISION N/A 06/27/2014   Procedure: EXCISION SUBMANDIBULAR STONE;  Surgeon: Izora Gala, MD;  Location: [facility] OR;  Service: ENT;  Laterality: N/A;  . sublingual gland removal  2016  . 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  Subjective Assessment - 05/07/17 1351    Subjective  Pt stated she has not fallen since last session.  Reports she continues to have back pain.  Has scheduled MD apt for next Monday.      Pertinent History  HTN; Parkinson's Disease; DMII; CKD Stage III    Patient Stated Goals  Improve balance and walking     Currently in Pain?  Yes    Pain  Score  9     Pain Location  Back    Pain Orientation  Lower;Right    Pain Descriptors / Indicators  Aching    Pain Type  Acute pain    Pain Onset  In the past 7 days    Pain Frequency  Intermittent    Aggravating Factors   standing/walking    Pain Relieving Factors  sitting                       [facility] Adult PT Treatment/Exercise - 05/07/17 0001      Lumbar Exercises: Seated   Other Seated Lumbar Exercises  Trunk rotation with upper extremity reach x 20 to loosen trunk and increase rotation. Lumbar flexion to upright sitting with upper extremity flexion x 20     Other Seated Lumbar Exercises  STS with ball in hands, shoulder flexion over head, then squat tapping 18in step and have a seat to improve gluteal strengthening and reaching      Knee/Hip Exercises: Standing   Gait Training  2x 100 ft with metronome set at 64 bpm      Knee/Hip Exercises:  Seated   Other Seated Knee/Hip Exercises  stepping over 6" hurdle and reaching same side for cone 10 reps each side.    Marching  Both;10 reps;Limitations    Marching Limitations  alternating LE marching sitting on dynadisc    Sit to Sand  1 set;10 reps;without UE support;Other (comment) ball in hand, STS with shoulder flexion over head; tap 16in                PT Short Term Goals - 04/14/17 1619      PT SHORT TERM GOAL #1   Title  Patient and patient's family will demonstrate understanding and report regular compliance with home exercise program to improve patient's balance, flexibility, and strength.     Time  4    Period  Weeks    Status  New    Target Date  05/12/17      PT SHORT TERM GOAL #2   Title  Patient will demonstrate improvement in MMT strength of 1/2 grade in all tested musculature deficient at evalution to assist patient with ambulation and other fucntional mobility.     Time  4    Period  Weeks    Status  New    Target Date  05/12/17      PT SHORT TERM GOAL #3   Title  Patient will  demonstrate ability to maintain single limb stance for 3 seconds on each lower extremity to assist with stair ambulation.     Time  4    Period  Weeks    Status  New    Target Date  05/12/17        PT Long Term Goals - 04/14/17 1623      PT LONG TERM GOAL #1   Title  Patient will demonstrate improvement in MMT strength of 1 grade in all tested musculature deficient at evalution to assist patient with ambulation and other fucntional mobility.     Time  8    Period  Weeks    Status  New    Target Date  06/09/17      PT LONG TERM GOAL #2   Title  Patient will demonstrate improvement in ability to perform 5 x sit to stand by 10 seconds indicating improvement in balance and functional mobility.     Time  8    Period  Weeks    Status  New    Target Date  06/09/17      PT LONG TERM GOAL #3   Title  Patient will demonstate ability to ambulate an improvement of 100 feet on the 3 minute walk test demonstrating improved velocity and safety with ambulation.     Time  8    Period  Weeks    Status  New    Target Date  06/09/17            Plan - 05/07/17 1447    Clinical Impression Statement  Session focus on improving gait mechanics and balance activities.  Pt c/o high LBP scale so limited standing minus gait training and STS activities to control pain through session.  No reports of fall since last session, pt stated she has standard check-up apt scheduled for Monday next week.  Cotninued with trunk rotation with UE reaching to improve core strengthening to improve mobility with gait.  Added STS for gluteal strengthening and to improve reaching outside BOS. Pt improved stride length with gait and ability to stay "left, right, left" during gait, cueing  to imporve posture through session.  No reports of increased pain, was limited by fatigue at EOS.      Rehab Potential  Fair    Clinical Impairments Affecting Rehab Potential  Positive: support system, motivated; Negative: progressive  nature of PD; comorbidities     PT Frequency  2x / week    PT Duration  8 weeks    PT Treatment/Interventions  ADLs/Self Care Home Management;Aquatic Therapy;Moist Heat;DME Instruction;Gait training;Stair training;Functional mobility training;Therapeutic activities;Therapeutic exercise;Balance training;Neuromuscular re-education;Patient/family education;Orthotic Fit/Training;Manual techniques;Cryotherapy;Passive range of motion;Dry needling;Energy conservation;Taping;Visual/perceptual remediation/compensation    PT Next Visit Plan  Follow-up about physician. Consider continuing metronome to assist with gait speed.  If limited by LBP complete seated activities on dynadisc.  Continue trunk stretches to decrease rigidity and improve trunk mobility with gait.      PT Home Exercise Plan  04/15/17: Supine HS stretch, bridges and supine SLR.         Patient will benefit from skilled therapeutic intervention in order to improve the following deficits and impairments:  Abnormal gait, Decreased balance, Decreased endurance, Decreased mobility, Difficulty walking, Improper body mechanics, Decreased activity tolerance, Decreased coordination, Decreased strength, Impaired flexibility, Postural dysfunction, Pain  Visit Diagnosis: Unsteadiness on feet  Other abnormalities of gait and mobility  Repeated falls  Other symptoms and signs involving the musculoskeletal system     Problem List Patient Active Problem List   Diagnosis Date Noted  . Syncope 10/12/2014  . Diabetes ([place]) 10/12/2014  . Hypertension 10/12/2014  . OSTEOARTHRITIS, 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  . TRIGGER FINGER 04/27/2007   Ihor Austin, [place]; [place]  Aldona Lento 05/07/2017, 3:00 PM  [place] [address] [place], [place], [address] Phone: [phone]   Fax:  [phone]  Name: [name] MRN: [id number] Date of Birth: December 22, 1940

## 2017-05-10 DIAGNOSIS — E1165 Type 2 diabetes mellitus with hyperglycemia: Secondary | ICD-10-CM | POA: Diagnosis not present

## 2017-05-10 DIAGNOSIS — I10 Essential (primary) hypertension: Secondary | ICD-10-CM | POA: Diagnosis not present

## 2017-05-10 DIAGNOSIS — N183 Chronic kidney disease, stage 3 (moderate): Secondary | ICD-10-CM | POA: Diagnosis not present

## 2017-05-12 ENCOUNTER — Ambulatory Visit (HOSPITAL_COMMUNITY): Payer: Medicare Other | Admitting: Physical Therapy

## 2017-05-12 ENCOUNTER — Encounter (HOSPITAL_COMMUNITY): Payer: Self-pay | Admitting: Physical Therapy

## 2017-05-12 DIAGNOSIS — R2689 Other abnormalities of gait and mobility: Secondary | ICD-10-CM

## 2017-05-12 DIAGNOSIS — R2681 Unsteadiness on feet: Secondary | ICD-10-CM

## 2017-05-12 DIAGNOSIS — R296 Repeated falls: Secondary | ICD-10-CM | POA: Diagnosis not present

## 2017-05-12 DIAGNOSIS — R29898 Other symptoms and signs involving the musculoskeletal system: Secondary | ICD-10-CM | POA: Diagnosis not present

## 2017-05-12 NOTE — Therapy (Signed)
[facility] [address] [place], [place], [address] Phone: [phone]   Fax:  [phone]  Physical Therapy Treatment / Re-assessment  Patient Details  Name: [name] MRN: [id number] Date of Birth: May 07, 1940 Referring Provider: Phillips Odor MD   Encounter Date: 05/12/2017   Progress Note Reporting Period 04/14/17 to 05/12/17  See note below for Objective Data and Assessment of Progress/Goals.       PT End of Session - 05/12/17 1358    Visit Number  8    Number of Visits  17    Date for PT Re-Evaluation  06/09/17 Re-assessed 05/12/17    Authorization Type  UHC Medicare; Secondary: Medicaid [place] Time Period  04/14/17 - 06/09/17    Authorization - Visit Number  0    Authorization - Number of Visits  10    PT Start Time  1302    PT Stop Time  1345    PT Time Calculation (min)  43 min    Equipment Utilized During Treatment  Gait belt    Activity Tolerance  Patient tolerated treatment well;No increased pain    Behavior During Therapy  WFL for tasks assessed/performed       Past Medical History:  Diagnosis Date  . Diabetes mellitus   . GERD (gastroesophageal reflux disease)   . Hypertension   . Parkinson disease ([place])   . Scoliosis   . Scoliosis   . UTI (lower urinary tract infection)     Past Surgical History:  Procedure Laterality Date  . boil Left    boil removed from left cheek  . EYE SURGERY     Retina Detachment  . MASS EXCISION N/A 06/27/2014   Procedure: EXCISION SUBMANDIBULAR STONE;  Surgeon: Izora Gala, MD;  Location: [facility] OR;  Service: ENT;  Laterality: N/A;  . sublingual gland removal  2016  . 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  Subjective Assessment - 05/12/17 1306    Subjective  Patient's daughter's stated that they went to the physician and he stated that her leg is okay. Patient reported back pain.     Pertinent History  HTN; Parkinson's  Disease; DMII; CKD Stage III    Limitations  Standing;Walking;House hold activities    How long can you sit comfortably?  unlimted    How long can you stand comfortably?  10-15 minutes    How long can you walk comfortably?  7 minutes    Patient Stated Goals  Improve balance and walking     Currently in Pain?  Yes    Pain Score  9     Pain Location  Back    Pain Orientation  Lower    Pain Descriptors / Indicators  Aching    Pain Type  Chronic pain    Pain Onset  More than a month ago    Aggravating Factors   standing/walking    Pain Relieving Factors  sitting    Multiple Pain Sites  No         [facility] PT Assessment - 05/12/17 0001      Assessment   Medical Diagnosis  Falling and gait impairments    Referring Provider  Phillips Odor MD      [place] residence    Living Arrangements  Children    Type of [place]  Home Access  Stairs to enter    Entrance Stairs-Number of Steps  5    Entrance Stairs-Rails  Right;Left;Can reach both    Home Layout  One level    [place] - 4 wheels;Cane - single point      Prior Function   Level of Independence  Needs assistance with ADLs;Requires assistive device for independence;Independent with household mobility without device;Independent with gait;Independent with transfers    Vocation  Retired      [employer]   Overall Cognitive Status  History of cognitive impairments - at baseline      Observation/Other Assessments   Focus on Therapeutic Outcomes (FOTO)   48% (52% limited)      Posture/Postural Control   Posture/Postural Control  Postural limitations    Postural Limitations  Rounded Shoulders;Forward head;Increased thoracic kyphosis;Flexed trunk      Strength   Right Hip Flexion  4/5 was 4/5    Right Hip Extension  2+/5 was 2/5    Right Hip ABduction  4/5 was 3+/5    Left Hip Flexion  4/5 was 4/5    Left Hip Extension  2+/5 was 2+/5    Left Hip ABduction  4/5 was 3+/5     Right Knee Flexion  4+/5 was 4+/5    Right Knee Extension  4+/5 was 4+/5    Left Knee Flexion  4+/5 was 4+/5    Left Knee Extension  4+/5 was 4+/5    Right Ankle Dorsiflexion  4+/5 was 4/5    Right Ankle Plantar Flexion  2+/5 was 2+/5    Left Ankle Dorsiflexion  4+/5 was 4/5    Left Ankle Plantar Flexion  2+/5 was 2/5      Flexibility   Hamstrings  Limited bilaterally. With 90/90 test 41 degrees from 90 on the left and 40 degrees from 90 on the right.       Ambulation/Gait   Ambulation/Gait  Yes    Ambulation/Gait Assistance  4: Min guard    Ambulation Distance (Feet)  180 Feet 3MWT. No rest break    Assistive device  Straight cane    Gait Pattern  Decreased arm swing - right;Decreased arm swing - left;Decreased stride length;Decreased hip/knee flexion - right;Decreased hip/knee flexion - left;Decreased dorsiflexion - right;Decreased dorsiflexion - left;Trunk flexed    Ambulation Surface  Level      Static Standing Balance   Static Standing - Balance Support  No upper extremity supported    Static Standing Balance -  Activities   Single Leg Stance - Right Leg;Single Leg Stance - Left Leg    Static Standing - Comment/# of Minutes  Less than 1 second on each leg      Standardized Balance Assessment   Five times sit to stand comments   23.26 seconds leaning trunk far forward with intermittent use of upper extremities on thighs                   [facility] Adult PT Treatment/Exercise - 05/12/17 0001      Lumbar Exercises: Seated   Other Seated Lumbar Exercises  Trunk rotation with upper extremity reach x 20 to loosen trunk and increase rotation. Lumbar flexion to upright sitting with upper extremity flexion x 20       Knee/Hip Exercises: Seated   Other Seated Knee/Hip Exercises  stepping over 12" hurdle and reaching same side for cone 10 reps each side.    Marching  Both;10 reps;Limitations  Marching Limitations  alternating LE marching with 2# weights around ankles              PT Education - 05/12/17 1357    Education provided  Yes    Education Details  Patient and patient's niece were educated on results of re-assessment.     Person(s) Educated  Patient    Methods  Explanation    Comprehension  Verbalized understanding       PT Short Term Goals - 05/12/17 1358      PT SHORT TERM GOAL #1   Title  Patient and patient's family will demonstrate understanding and report regular compliance with home exercise program to improve patient's balance, flexibility, and strength.     Time  4    Period  Weeks    Status  On-going      PT SHORT TERM GOAL #2   Title  Patient will demonstrate improvement in MMT strength of 1/2 grade in all tested musculature deficient at evalution to assist patient with ambulation and other fucntional mobility.     Baseline  05/12/17: Patient improved in some, but not all muscle groups. See objective measures.     Time  4    Period  Weeks    Status  On-going      PT SHORT TERM GOAL #3   Title  Patient will demonstrate ability to maintain single limb stance for 3 seconds on each lower extremity to assist with stair ambulation.     Baseline  05/12/17: Patient is unable to maintain SLS for 1 second on either lower extremity.     Time  4    Period  Weeks    Status  On-going        PT Long Term Goals - 05/12/17 1359      PT LONG TERM GOAL #1   Title  Patient will demonstrate improvement in MMT strength of 1 grade in all tested musculature deficient at evalution to assist patient with ambulation and other fucntional mobility.     Baseline  05/12/17: Patient has not improved by 1 MMT grade. See objective measures.     Time  8    Period  Weeks    Status  New      PT LONG TERM GOAL #2   Title  Patient will demonstrate improvement in ability to perform 5 x sit to stand by 10 seconds indicating improvement in balance and functional mobility.     Baseline  05/12/17: Patient's 5 x sit to stand time improved by 3.06 seconds from  evaluation.     Time  8    Period  Weeks    Status  On-going      PT LONG TERM GOAL #3   Title  Patient will demonstate ability to ambulate an improvement of 100 feet on the 3 minute walk test demonstrating improved velocity and safety with ambulation.     Baseline  05/12/17: Patient improved distance ambulated on 3MWT by 42 feet.     Time  8    Period  Weeks    Status  On-going            Plan - 05/12/17 1358    Clinical Impression Statement  This session performed a re-assessment of patient's progress toward goals. Patient has not achieved any of her short term goals or long term goals. However, she has demonstrated some improvement in her lower extremity strength, her performance of the 5xSTS has improved, and her  gait distance on the 3 MWT has improved. Patient is still limited in lower extremity strength and would benefit from focused strengthening of proximal hip muscles. Patient stated she has been doing her exercises and says she has been walking, but the patient's daughter stated that patient has not been doing her home exercises. Therapist reminded patient and patient's family of the importance of doing exercises at home. The remainder of the session focused on exercises to improve posture, lower extremity strength, and coordination. Patient would benefit from continued skilled physical therapy in order to continue improving deficits in strength, balance, posture, coordination, ambulation, and overall functional mobility.    Rehab Potential  Fair    Clinical Impairments Affecting Rehab Potential  Positive: support system, motivated; Negative: progressive nature of PD; comorbidities     PT Frequency  2x / week    PT Duration  8 weeks    PT Treatment/Interventions  ADLs/Self Care Home Management;Aquatic Therapy;Moist Heat;DME Instruction;Gait training;Stair training;Functional mobility training;Therapeutic activities;Therapeutic exercise;Balance training;Neuromuscular  re-education;Patient/family education;Orthotic Fit/Training;Manual techniques;Cryotherapy;Passive range of motion;Dry needling;Energy conservation;Taping;Visual/perceptual remediation/compensation    PT Next Visit Plan  Update HEP with proximal hip strengthening. Focus on proximal hip strengthening particularly hip extension. Consider continuing metronome to assist with gait speed.  If limited by LBP complete seated activities on dynadisc.  Continue trunk stretches to decrease rigidity and improve trunk mobility with gait. Consider initiating postural exercises.       PT Home Exercise Plan  04/15/17: Supine HS stretch, bridges and supine SLR.         Patient will benefit from skilled therapeutic intervention in order to improve the following deficits and impairments:  Abnormal gait, Decreased balance, Decreased endurance, Decreased mobility, Difficulty walking, Improper body mechanics, Decreased activity tolerance, Decreased coordination, Decreased strength, Impaired flexibility, Postural dysfunction, Pain  Visit Diagnosis: Unsteadiness on feet  Other abnormalities of gait and mobility  Repeated falls  Other symptoms and signs involving the musculoskeletal system     Problem List Patient Active Problem List   Diagnosis Date Noted  . Syncope 10/12/2014  . Diabetes ([place]) 10/12/2014  . Hypertension 10/12/2014  . OSTEOARTHRITIS, 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  . TRIGGER FINGER 04/27/2007   Clarene Critchley PT, DPT 2:13 PM, 05/12/17 [facility] [place], [place], [address] Phone: [phone]   Fax:  [phone]  Name: [name] MRN: [id number] Date of Birth: 23-Jan-1940

## 2017-05-14 ENCOUNTER — Ambulatory Visit (HOSPITAL_COMMUNITY): Payer: Medicare Other | Admitting: Physical Therapy

## 2017-05-14 ENCOUNTER — Encounter (HOSPITAL_COMMUNITY): Payer: Self-pay | Admitting: Physical Therapy

## 2017-05-14 DIAGNOSIS — R2689 Other abnormalities of gait and mobility: Secondary | ICD-10-CM | POA: Diagnosis not present

## 2017-05-14 DIAGNOSIS — R2681 Unsteadiness on feet: Secondary | ICD-10-CM | POA: Diagnosis not present

## 2017-05-14 DIAGNOSIS — R29898 Other symptoms and signs involving the musculoskeletal system: Secondary | ICD-10-CM | POA: Diagnosis not present

## 2017-05-14 DIAGNOSIS — R296 Repeated falls: Secondary | ICD-10-CM

## 2017-05-14 NOTE — Therapy (Signed)
[facility] [place], [place], [address] Phone: [phone]   Fax:  [phone]  Physical Therapy Treatment  Patient Details  Name: [name] MRN: [id number] Date of Birth: September 29, 1940 Referring Provider: Phillips Odor MD   Encounter Date: 05/14/2017  PT End of Session - 05/14/17 1212    Visit Number  9    Number of Visits  17    Date for PT Re-Evaluation  06/09/17 Re-assessed 05/12/17    Authorization Type  UHC Medicare; Secondary: Medicaid [place] Time Period  04/14/17 - 06/09/17    Authorization - Visit Number  1    Authorization - Number of Visits  10    PT Start Time  1119    PT Stop Time  1200    PT Time Calculation (min)  41 min    Equipment Utilized During Treatment  Gait belt    Activity Tolerance  Patient tolerated treatment well;No increased pain    Behavior During Therapy  WFL for tasks assessed/performed       Past Medical History:  Diagnosis Date  . Diabetes mellitus   . GERD (gastroesophageal reflux disease)   . Hypertension   . Parkinson disease ([place])   . Scoliosis   . Scoliosis   . UTI (lower urinary tract infection)     Past Surgical History:  Procedure Laterality Date  . boil Left    boil removed from left cheek  . EYE SURGERY     Retina Detachment  . MASS EXCISION N/A 06/27/2014   Procedure: EXCISION SUBMANDIBULAR STONE;  Surgeon: Izora Gala, MD;  Location: [facility] OR;  Service: ENT;  Laterality: N/A;  . sublingual gland removal  2016  . 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  Subjective Assessment - 05/14/17 1125    Subjective  Patient stated that she is having back pain still in the same place.     Pertinent History  HTN; Parkinson's Disease; DMII; CKD Stage III    Limitations  Standing;Walking;House hold activities    How long can you sit comfortably?  unlimted    How long can you stand comfortably?  10-15 minutes    How long can you  walk comfortably?  7 minutes    Patient Stated Goals  Improve balance and walking     Currently in Pain?  Yes    Pain Score  9     Pain Location  Back    Pain Orientation  Lower    Pain Descriptors / Indicators  Aching    Pain Type  Chronic pain    Pain Onset  More than a month ago    Aggravating Factors   standing/walking    Pain Relieving Factors  sitting                       [facility] Adult PT Treatment/Exercise - 05/14/17 0001      Knee/Hip Exercises: Standing   Hip Extension  Stengthening;Right;Left;2 sets;10 reps;Knee straight Verbal cues and tactile cues for form and posture    Other Standing Knee Exercises         Knee/Hip Exercises: Seated   Other Seated Knee/Hip Exercises  Postural exercises to improve posture with ambulation and standing: Bilateral shoulder extension, scapular retraction, and rows. 2 x 10 with red theraband. Max verbal cues, tactile cues, and demonstration for form  Knee/Hip Exercises: Supine   Bridges  10 reps partial range      Knee/Hip Exercises: Sidelying   Clams  15 on each lower extremity Added to patient's HEP          Balance Exercises - 05/14/17 1139      Balance Exercises: Standing   Tandem Stance  Eyes open;Foam/compliant surface;30 secs;Intermittent upper extremity support;2 reps;Other (comment) Each lower extremity forward    Sidestepping  2 reps;Other (comment) 2 round trips inside of parallel bars. Cues for form    Step Over Hurdles / [place]  Inside parallel bars stepping over 3 6 inch hurdles x 6 trips         PT Education - 05/14/17 1210    Education provided  Yes    Education Details  Patient was educated on purpose and technique of exercises throughout session. Updated HEP.     Person(s) Educated  Patient    Methods  Explanation;Handout;Verbal cues;Tactile cues;Demonstration    Comprehension  Verbalized understanding;Verbal cues required;Tactile cues required;Need further instruction;Returned demonstration        PT Short Term Goals - 05/12/17 1358      PT SHORT TERM GOAL #1   Title  Patient and patient's family will demonstrate understanding and report regular compliance with home exercise program to improve patient's balance, flexibility, and strength.     Time  4    Period  Weeks    Status  On-going      PT SHORT TERM GOAL #2   Title  Patient will demonstrate improvement in MMT strength of 1/2 grade in all tested musculature deficient at evalution to assist patient with ambulation and other fucntional mobility.     Baseline  05/12/17: Patient improved in some, but not all muscle groups. See objective measures.     Time  4    Period  Weeks    Status  On-going      PT SHORT TERM GOAL #3   Title  Patient will demonstrate ability to maintain single limb stance for 3 seconds on each lower extremity to assist with stair ambulation.     Baseline  05/12/17: Patient is unable to maintain SLS for 1 second on either lower extremity.     Time  4    Period  Weeks    Status  On-going        PT Long Term Goals - 05/12/17 1359      PT LONG TERM GOAL #1   Title  Patient will demonstrate improvement in MMT strength of 1 grade in all tested musculature deficient at evalution to assist patient with ambulation and other fucntional mobility.     Baseline  05/12/17: Patient has not improved by 1 MMT grade. See objective measures.     Time  8    Period  Weeks    Status  New      PT LONG TERM GOAL #2   Title  Patient will demonstrate improvement in ability to perform 5 x sit to stand by 10 seconds indicating improvement in balance and functional mobility.     Baseline  05/12/17: Patient's 5 x sit to stand time improved by 3.06 seconds from evaluation.     Time  8    Period  Weeks    Status  On-going      PT LONG TERM GOAL #3   Title  Patient will demonstate ability to ambulate an improvement of 100 feet on the 3 minute walk test demonstrating  improved velocity and safety with ambulation.     Baseline   05/12/17: Patient improved distance ambulated on 3MWT by 42 feet.     Time  8    Period  Weeks    Status  On-going            Plan - 05/14/17 1212    Clinical Impression Statement  This session progressed patient with standing balance and lower extremity strengthening exercises. Patient continued to report back pain, but therapist modified exercises by allowing patient to take therapeutic rest breaks in between sets. This session began with seated postural strengthening to improve patient's posture and mechanics with walking. Then progressed to patient performing standing strengthening exercises for her lower extremities. Then patient performed balance exercises in the parallel bars. This session added stepping over hurdles, sidestepping, and marching to patient's balance exercises. Finally patient performed mat exercises for improved lower extremity strengthening and added clams to patient's home exercise program. Patient would benefit from continued skilled physical therapy for balance exercises, strengthening exercises, gait training, and overall functional mobility.     Rehab Potential  Fair    Clinical Impairments Affecting Rehab Potential  Positive: support system, motivated; Negative: progressive nature of PD; comorbidities     PT Frequency  2x / week    PT Duration  8 weeks    PT Treatment/Interventions  ADLs/Self Care Home Management;Aquatic Therapy;Moist Heat;DME Instruction;Gait training;Stair training;Functional mobility training;Therapeutic activities;Therapeutic exercise;Balance training;Neuromuscular re-education;Patient/family education;Orthotic Fit/Training;Manual techniques;Cryotherapy;Passive range of motion;Dry needling;Energy conservation;Taping;Visual/perceptual remediation/compensation    PT Next Visit Plan  Focus on proximal hip strengthening particularly hip extension. Consider continuing metronome to assist with gait speed.  If limited by LBP complete seated activities on  dynadisc or allow rest breaks. Continue trunk stretches to decrease rigidity and improve trunk mobility with gait. Consider initiating postural exercises.       PT Home Exercise Plan  04/15/17: Supine HS stretch, bridges and supine SLR.  05/14/17: Sidelying clamshells 1 x 15 each lower extremity       Patient will benefit from skilled therapeutic intervention in order to improve the following deficits and impairments:  Abnormal gait, Decreased balance, Decreased endurance, Decreased mobility, Difficulty walking, Improper body mechanics, Decreased activity tolerance, Decreased coordination, Decreased strength, Impaired flexibility, Postural dysfunction, Pain  Visit Diagnosis: Unsteadiness on feet  Other abnormalities of gait and mobility  Repeated falls  Other symptoms and signs involving the musculoskeletal system     Problem List Patient Active Problem List   Diagnosis Date Noted  . Syncope 10/12/2014  . Diabetes ([place]) 10/12/2014  . Hypertension 10/12/2014  . OSTEOARTHRITIS, 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  . TRIGGER FINGER 04/27/2007   Clarene Critchley PT, DPT 12:19 PM, 05/14/17 [place], [place], [address] Phone: [phone]   Fax:  [phone]  Name: [name] MRN: [id number] Date of Birth: [birth year]

## 2017-05-14 NOTE — Patient Instructions (Signed)
  SIDELYING CL[place]HELL - CLAM SHELL While lying on your side with your knees bent, draw up the top knee while keeping contact of your feet together. Do not let your pelvis roll back during the lifting movement. Perform on each side Repeat 15 Times Hold 1 Second Complete 1 Set Perform 1 Time(s) a Day

## 2017-05-19 ENCOUNTER — Encounter (HOSPITAL_COMMUNITY): Payer: Self-pay

## 2017-05-19 ENCOUNTER — Ambulatory Visit (HOSPITAL_COMMUNITY): Payer: Medicare Other

## 2017-05-19 DIAGNOSIS — R29898 Other symptoms and signs involving the musculoskeletal system: Secondary | ICD-10-CM | POA: Diagnosis not present

## 2017-05-19 DIAGNOSIS — R296 Repeated falls: Secondary | ICD-10-CM

## 2017-05-19 DIAGNOSIS — R2681 Unsteadiness on feet: Secondary | ICD-10-CM

## 2017-05-19 DIAGNOSIS — R2689 Other abnormalities of gait and mobility: Secondary | ICD-10-CM | POA: Diagnosis not present

## 2017-05-19 NOTE — Therapy (Signed)
[place], [place], [address] Phone: [phone]   Fax:  [phone]  Physical Therapy Treatment  Patient Details  Name: [name] MRN: [id number] Date of Birth: [birth year] Referring Provider: Phillips Odor MD   Encounter Date: 05/19/2017  PT End of Session - 05/19/17 1527    Visit Number  10    Number of Visits  17    Date for PT Re-Evaluation  06/09/17 reassessment complete 05/12/17    Authorization Type  UHC Medicare; Secondary: Medicaid [place] Time Period  04/14/17 - 06/09/17    Authorization - Visit Number  2    Authorization - Number of Visits  10    PT Start Time  4097 5' on Nustep, no charge    PT Stop Time  1435    PT Time Calculation (min)  52 min    Equipment Utilized During Treatment  Gait belt    Activity Tolerance  Patient tolerated treatment well;No increased pain    Behavior During Therapy  WFL for tasks assessed/performed       Past Medical History:  Diagnosis Date  . Diabetes mellitus   . GERD (gastroesophageal reflux disease)   . Hypertension   . Parkinson disease ([place])   . Scoliosis   . Scoliosis   . UTI (lower urinary tract infection)     Past Surgical History:  Procedure Laterality Date  . boil Left    boil removed from left cheek  . EYE SURGERY     Retina Detachment  . MASS EXCISION N/A 06/27/2014   Procedure: EXCISION SUBMANDIBULAR STONE;  Surgeon: Izora Gala, MD;  Location: [facility] OR;  Service: ENT;  Laterality: N/A;  . sublingual gland removal  2016  . 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  Subjective Assessment - 05/19/17 1348    Subjective  Pt stated she continues to have some Lt sided lower back pain and Rt leg continues to bother her.      Patient is accompained by:  Family member daughter    Patient Stated Goals  Improve balance and walking     Currently in Pain?  Yes    Pain Score  8     Pain Location  Back  Back and Rt LE    Pain Orientation  Left;Lower    Pain Descriptors / Indicators  Aching    Pain Type  Chronic pain    Pain Onset  More than a month ago    Pain Frequency  Intermittent    Aggravating Factors   standing/walking    Pain Relieving Factors  sitting                       [facility] Adult PT Treatment/Exercise - 05/19/17 0001      Knee/Hip Exercises: Aerobic   Nustep  5' reciprocal UE/LE L1 SPM 39 (not included in charges)      Knee/Hip Exercises: Standing   Gait Training  226 with metronome at 63 adagio    Other Standing Knee Exercises  postural strengthening RTB shoulder extension and rows 2x 10 reps    Other Standing Knee Exercises  alternating marching 10x with UE assist      Knee/Hip Exercises: Seated   Sit to Sand  10 reps;without UE support      Knee/Hip Exercises: Supine   Bridges  10 reps;2  sets partial range    Other Supine Knee/Hip Exercises  stargazer stretch x 2 min (reach arms and legs towards every corner of the mat)          Balance Exercises - 05/19/17 1536      Balance Exercises: Standing   Sidestepping  2 reps;Limitations down blue     Step Over Hurdles / Cones  inside // bars forward alternating and sidestep (meet feet) over 4 6in hurdles 4RT    Marching Limitations  10x5" HHA         05/19/17 1537  Balance Exercises: Standing  Sidestepping 2 reps;Limitations (down blue )  Step Over Hurdles / Cones inside // bars forward alternating and sidestep (meet feet) over 4 6in hurdles 4RT  Marching Limitations 10x5" HHA  Balance Exercises: Seated  Dynamic Sitting Eyes opened;Reaching outside base of support;Limitations (sitting on dynadisc march over 12in hurdles and reach for co)  Other Seated Exercises Throw/catch seated on dynadisc 15x to improve core rotation and reach out of BOS       PT Short Term Goals - 05/12/17 1358      PT SHORT TERM GOAL #1   Title  Patient and patient's family will demonstrate understanding and  report regular compliance with home exercise program to improve patient's balance, flexibility, and strength.     Time  4    Period  Weeks    Status  On-going      PT SHORT TERM GOAL #2   Title  Patient will demonstrate improvement in MMT strength of 1/2 grade in all tested musculature deficient at evalution to assist patient with ambulation and other fucntional mobility.     Baseline  05/12/17: Patient improved in some, but not all muscle groups. See objective measures.     Time  4    Period  Weeks    Status  On-going      PT SHORT TERM GOAL #3   Title  Patient will demonstrate ability to maintain single limb stance for 3 seconds on each lower extremity to assist with stair ambulation.     Baseline  05/12/17: Patient is unable to maintain SLS for 1 second on either lower extremity.     Time  4    Period  Weeks    Status  On-going        PT Long Term Goals - 05/12/17 1359      PT LONG TERM GOAL #1   Title  Patient will demonstrate improvement in MMT strength of 1 grade in all tested musculature deficient at evalution to assist patient with ambulation and other fucntional mobility.     Baseline  05/12/17: Patient has not improved by 1 MMT grade. See objective measures.     Time  8    Period  Weeks    Status  New      PT LONG TERM GOAL #2   Title  Patient will demonstrate improvement in ability to perform 5 x sit to stand by 10 seconds indicating improvement in balance and functional mobility.     Baseline  05/12/17: Patient's 5 x sit to stand time improved by 3.06 seconds from evaluation.     Time  8    Period  Weeks    Status  On-going      PT LONG TERM GOAL #3   Title  Patient will demonstate ability to ambulate an improvement of 100 feet on the 3 minute walk test demonstrating improved velocity and safety  with ambulation.     Baseline  05/12/17: Patient improved distance ambulated on 3MWT by 42 feet.     Time  8    Period  Weeks    Status  On-going            Plan -  05/19/17 1530    Clinical Impression Statement  Continued session focus with balance, coordination and LE strengthening.  Progressed to standing postural strengthening exercises to improve awareness of posture with tactile and verbal cueing for correct form.  Also progressed sidestepping without HHA down blue line with cueing to reduce BLE ER for glut med strengthening.  Pt did c/o LBP more at the EOS, added stargazer stretch and continues with bridges for gluteal strengthening to assist with back pain.  No reports of increased pain at EOS, was limited by fatigue with activites.      Rehab Potential  Fair    Clinical Impairments Affecting Rehab Potential  Positive: support system, motivated; Negative: progressive nature of PD; comorbidities     PT Frequency  2x / week    PT Duration  8 weeks    PT Treatment/Interventions  ADLs/Self Care Home Management;Aquatic Therapy;Moist Heat;DME Instruction;Gait training;Stair training;Functional mobility training;Therapeutic activities;Therapeutic exercise;Balance training;Neuromuscular re-education;Patient/family education;Orthotic Fit/Training;Manual techniques;Cryotherapy;Passive range of motion;Dry needling;Energy conservation;Taping;Visual/perceptual remediation/compensation    PT Next Visit Plan  Focus on proximal hip strengthening particularly hip extension. Consider continuing metronome to assist with gait speed.  If limited by LBP complete seated activities on dynadisc or allow rest breaks. Continue trunk stretches to decrease rigidity and improve trunk mobility with gait. Consider initiating postural exercises.       PT Home Exercise Plan  04/15/17: Supine HS stretch, bridges and supine SLR.  05/14/17: Sidelying clamshells 1 x 15 each lower extremity       Patient will benefit from skilled therapeutic intervention in order to improve the following deficits and impairments:  Abnormal gait, Decreased balance, Decreased endurance, Decreased mobility, Difficulty  walking, Improper body mechanics, Decreased activity tolerance, Decreased coordination, Decreased strength, Impaired flexibility, Postural dysfunction, Pain  Visit Diagnosis: Unsteadiness on feet  Other abnormalities of gait and mobility  Repeated falls  Other symptoms and signs involving the musculoskeletal system     Problem List Patient Active Problem List   Diagnosis Date Noted  . Syncope 10/12/2014  . Diabetes ([place]) 10/12/2014  . Hypertension 10/12/2014  . OSTEOARTHRITIS, 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  . TRIGGER FINGER 04/27/2007   Ihor Austin, [place]; [place]  Aldona Lento 05/19/2017, 3:37 PM  [facility] [address] [place], [place], [address] Phone: [phone]   Fax:  [phone]  Name: [name] MRN: [id number] Date of Birth: [birth year]

## 2017-05-21 ENCOUNTER — Ambulatory Visit (HOSPITAL_COMMUNITY): Payer: Medicare Other | Admitting: Physical Therapy

## 2017-05-21 ENCOUNTER — Encounter (HOSPITAL_COMMUNITY): Payer: Self-pay | Admitting: Physical Therapy

## 2017-05-21 DIAGNOSIS — R296 Repeated falls: Secondary | ICD-10-CM | POA: Diagnosis not present

## 2017-05-21 DIAGNOSIS — R2689 Other abnormalities of gait and mobility: Secondary | ICD-10-CM

## 2017-05-21 DIAGNOSIS — R29898 Other symptoms and signs involving the musculoskeletal system: Secondary | ICD-10-CM

## 2017-05-21 DIAGNOSIS — R2681 Unsteadiness on feet: Secondary | ICD-10-CM

## 2017-05-21 NOTE — Therapy (Signed)
[place], [place], [address] Phone: [phone]   Fax:  [phone]  Physical Therapy Treatment  Patient Details  Name: [name] MRN: [id number] Date of Birth: Apr 01, 1940 Referring Provider: Phillips Odor MD   Encounter Date: 05/21/2017  PT End of Session - 05/21/17 1208    Visit Number  11    Number of Visits  17    Date for PT Re-Evaluation  06/09/17 reassessment complete 05/12/17    Authorization Type  [employer]HC Medicare; Secondary: Medicaid [place] Time Period  04/14/17 - 06/09/17    Authorization - Visit Number  3    Authorization - Number of Visits  10    PT Start Time  1108 5 minutes on nustep uncharged    PT Stop Time  1155    PT Time Calculation (min)  47 min    Equipment Utilized During Treatment  Gait belt    Activity Tolerance  Patient tolerated treatment well;No increased pain    Behavior During Therapy  WFL for tasks assessed/performed       Past Medical History:  Diagnosis Date  . Diabetes mellitus   . GERD (gastroesophageal reflux disease)   . Hypertension   . Parkinson disease ([place])   . Scoliosis   . Scoliosis   . UTI (lower urinary tract infection)     Past Surgical History:  Procedure Laterality Date  . boil Left    boil removed from left cheek  . EYE SURGERY     Retina Detachment  . MASS EXCISION N/A 06/27/2014   Procedure: EXCISION SUBMANDIBULAR STONE;  Surgeon: Izora Gala, MD;  Location: [facility] OR;  Service: ENT;  Laterality: N/A;  . sublingual gland removal  2016  . 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  Subjective Assessment - 05/21/17 1112    Subjective  Patient stated she is still having back pain and that the overcast weather has increased her pain.    Patient is accompained by:  Family member Patient's niece in waiting room    Patient Stated Goals  Improve balance and walking     Currently in Pain?  Yes    Pain Score  9      Pain Location  Back    Pain Orientation  Lower    Pain Descriptors / Indicators  Aching    Pain Type  Chronic pain    Pain Onset  More than a month ago                       [facility] Adult PT Treatment/Exercise - 05/21/17 0001      Knee/Hip Exercises: Aerobic   Nustep  5' reciprocal UE/LE L1 SPM 39 (not included in charges)      Knee/Hip Exercises: Standing   Gait Training  226 with metronome at 65 BPM cues to stand tall and act as if balancing books on head    Other Standing Knee Exercises  Standing postural strengthening rows, shoulder extension and scapular retraction with red theraband 2 x 10 sets maximal tactile and verbal cues for form      Knee/Hip Exercises: Seated   Sit to Sand  10 reps;without UE support Tactile and verbal cues to decrease anterior trunk lean      Knee/Hip Exercises: Supine   Bridges  10 reps;2 sets partial range  Balance Exercises - 05/21/17 1203      Balance Exercises: Standing   Step Over Hurdles / Cones  inside parallel bars over 3 6 inch hurdles 2 roundtrips forward and 2 round trips sideways and sidestep (meet feet)     Marching Limitations  20x alternating legs with intermittent HHA      Balance Exercises: Seated   Dynamic Sitting  Eyes opened;Reaching outside base of support;Limitations On dyna Disc. Step over 12'' hurdle reaching for cone 20x    Other Seated Exercises  Throw/catch seated on dynadisc 15x to improve core rotation and reach out of BOS        PT Education - 05/21/17 1113    Education provided  Yes    Education Details  Patient educated on purpose and technique of exercises throughout session.     Person(s) Educated  Patient    Methods  Explanation;Demonstration;Verbal cues;Tactile cues    Comprehension  Verbalized understanding;Tactile cues required;Verbal cues required;Need further instruction       PT Short Term Goals - 05/12/17 1358      PT SHORT TERM GOAL #1   Title  Patient and  patient's family will demonstrate understanding and report regular compliance with home exercise program to improve patient's balance, flexibility, and strength.     Time  4    Period  Weeks    Status  On-going      PT SHORT TERM GOAL #2   Title  Patient will demonstrate improvement in MMT strength of 1/2 grade in all tested musculature deficient at evalution to assist patient with ambulation and other fucntional mobility.     Baseline  05/12/17: Patient improved in some, but not all muscle groups. See objective measures.     Time  4    Period  Weeks    Status  On-going      PT SHORT TERM GOAL #3   Title  Patient will demonstrate ability to maintain single limb stance for 3 seconds on each lower extremity to assist with stair ambulation.     Baseline  05/12/17: Patient is unable to maintain SLS for 1 second on either lower extremity.     Time  4    Period  Weeks    Status  On-going        PT Long Term Goals - 05/12/17 1359      PT LONG TERM GOAL #1   Title  Patient will demonstrate improvement in MMT strength of 1 grade in all tested musculature deficient at evalution to assist patient with ambulation and other fucntional mobility.     Baseline  05/12/17: Patient has not improved by 1 MMT grade. See objective measures.     Time  8    Period  Weeks    Status  New      PT LONG TERM GOAL #2   Title  Patient will demonstrate improvement in ability to perform 5 x sit to stand by 10 seconds indicating improvement in balance and functional mobility.     Baseline  05/12/17: Patient's 5 x sit to stand time improved by 3.06 seconds from evaluation.     Time  8    Period  Weeks    Status  On-going      PT LONG TERM GOAL #3   Title  Patient will demonstate ability to ambulate an improvement of 100 feet on the 3 minute walk test demonstrating improved velocity and safety with ambulation.     Baseline  05/12/17: Patient improved distance ambulated on 3MWT by 42 feet.     Time  8    Period  Weeks     Status  On-going            Plan - 05/21/17 1210    Clinical Impression Statement  This session patient reported that she felt like she wasn't moving as well as last session and modified exercises as tolerated. This session continued with plan of care. This session included scapular retraction standing with red theraband to improve posture and safety with walking. This session continued gait training with metronome set for 65 BPM. Patient would benefit from continued focus on improving gait, lower extremity strength, and balance to improve overall functional mobility.     Rehab Potential  Fair    Clinical Impairments Affecting Rehab Potential  Positive: support system, motivated; Negative: progressive nature of PD; comorbidities     PT Frequency  2x / week    PT Duration  8 weeks    PT Treatment/Interventions  ADLs/Self Care Home Management;Aquatic Therapy;Moist Heat;DME Instruction;Gait training;Stair training;Functional mobility training;Therapeutic activities;Therapeutic exercise;Balance training;Neuromuscular re-education;Patient/family education;Orthotic Fit/Training;Manual techniques;Cryotherapy;Passive range of motion;Dry needling;Energy conservation;Taping;Visual/perceptual remediation/compensation    PT Next Visit Plan  Focus on proximal hip strengthening particularly hip extension. Continue use of metronome to assist with gait speed. Initiate hip extension exercises at hip extension machine. If limited by LBP complete seated activities on dynadisc or allow rest breaks. Continue trunk stretches to decrease rigidity and improve trunk mobility with gait. Continue postural exercises.       PT Home Exercise Plan  04/15/17: Supine HS stretch, bridges and supine SLR.  05/14/17: Sidelying clamshells 1 x 15 each lower extremity       Patient will benefit from skilled therapeutic intervention in order to improve the following deficits and impairments:  Abnormal gait, Decreased balance,  Decreased endurance, Decreased mobility, Difficulty walking, Improper body mechanics, Decreased activity tolerance, Decreased coordination, Decreased strength, Impaired flexibility, Postural dysfunction, Pain  Visit Diagnosis: Unsteadiness on feet  Other abnormalities of gait and mobility  Repeated falls  Other symptoms and signs involving the musculoskeletal system     Problem List Patient Active Problem List   Diagnosis Date Noted  . Syncope 10/12/2014  . Diabetes ([place]) 10/12/2014  . Hypertension 10/12/2014  . OSTEOARTHRITIS, 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  . TRIGGER FINGER 04/27/2007   Clarene Critchley PT, DPT 12:17 PM, 05/21/17 [place], [place], [address] Phone: [phone]   Fax:  [phone]  Name: [name] MRN: [id number] Date of Birth: 1940/11/16

## 2017-05-26 ENCOUNTER — Encounter (HOSPITAL_COMMUNITY): Payer: Self-pay | Admitting: Physical Therapy

## 2017-05-26 ENCOUNTER — Ambulatory Visit (HOSPITAL_COMMUNITY): Payer: Medicare Other | Admitting: Physical Therapy

## 2017-05-26 DIAGNOSIS — R29898 Other symptoms and signs involving the musculoskeletal system: Secondary | ICD-10-CM | POA: Diagnosis not present

## 2017-05-26 DIAGNOSIS — R2681 Unsteadiness on feet: Secondary | ICD-10-CM

## 2017-05-26 DIAGNOSIS — R2689 Other abnormalities of gait and mobility: Secondary | ICD-10-CM | POA: Diagnosis not present

## 2017-05-26 DIAGNOSIS — R296 Repeated falls: Secondary | ICD-10-CM | POA: Diagnosis not present

## 2017-05-26 NOTE — Therapy (Signed)
[facility] [place], [place], [address] Phone: [phone]   Fax:  [phone]  Physical Therapy Treatment  Patient Details  Name: [name] MRN: [id number] Date of Birth: [birth year] Referring Provider: Phillips Odor MD   Encounter Date: 05/26/2017  PT End of Session - 05/26/17 1614    Visit Number  12    Number of Visits  17    Date for PT Re-Evaluation  06/09/17 reassessment complete 05/12/17    Authorization Type  [employer] Medicare; Secondary: Medicaid [place] Time Period  04/14/17 - 06/09/17    Authorization - Visit Number  4    Authorization - Number of Visits  10    PT Start Time  1301 4 minutes unbilled for nustep    PT Stop Time  1345    PT Time Calculation (min)  44 min    Equipment Utilized During Treatment  Gait belt    Activity Tolerance  Patient tolerated treatment well;No increased pain    Behavior During Therapy  WFL for tasks assessed/performed       Past Medical History:  Diagnosis Date  . Diabetes mellitus   . GERD (gastroesophageal reflux disease)   . Hypertension   . Parkinson disease ([place])   . Scoliosis   . Scoliosis   . UTI (lower urinary tract infection)     Past Surgical History:  Procedure Laterality Date  . boil Left    boil removed from left cheek  . EYE SURGERY     Retina Detachment  . MASS EXCISION N/A 06/27/2014   Procedure: EXCISION SUBMANDIBULAR STONE;  Surgeon: Izora Gala, MD;  Location: [facility] OR;  Service: ENT;  Laterality: N/A;  . sublingual gland removal  2016  . 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  Subjective Assessment - 05/26/17 1308    Subjective  Patient reported that her back pain is about the same this session and that the overcast weather has increased her pain.    Patient is accompained by:  Family member Patient's niece in waiting room    Patient Stated Goals  Improve balance and walking     Currently in Pain?   Yes    Pain Score  9     Pain Location  Back    Pain Orientation  Lower    Pain Descriptors / Indicators  Aching    Pain Type  Chronic pain    Pain Onset  More than a month ago    Multiple Pain Sites  No         [facility] Adult PT Treatment/Exercise - 05/26/17 0001      Knee/Hip Exercises: Aerobic   Nustep  4' reciprocal UE/LE L1 SPM 39 (not included in charges)      Knee/Hip Exercises: Standing   Hip Extension  Stengthening;Right;Left;2 sets;10 reps 9# on hip extension machine. Tactile and verbal cues form.     Other Standing Knee Exercises  Standing postural strengthening rows, shoulder extension and scapular retraction with red theraband 2 x 10 sets maximal tactile and verbal cues for form          Balance Exercises - 05/26/17 1327      Balance Exercises: Standing   Sidestepping  Other (comment) Red theraband inside parallel bars 6 feet x 3 round trips    Step Over Hurdles / Cones  inside parallel bars over 4 6  inch hurdles 2 roundtrips forward and 2 round trips sideways and sidestep (meet feet)  intermittent upper extremity assistance    Marching Limitations  20x alternating legs with intermittent HHA and 2# ankle weights          PT Short Term Goals - 05/12/17 1358      PT SHORT TERM GOAL #1   Title  Patient and patient's family will demonstrate understanding and report regular compliance with home exercise program to improve patient's balance, flexibility, and strength.     Time  4    Period  Weeks    Status  On-going      PT SHORT TERM GOAL #2   Title  Patient will demonstrate improvement in MMT strength of 1/2 grade in all tested musculature deficient at evalution to assist patient with ambulation and other fucntional mobility.     Baseline  05/12/17: Patient improved in some, but not all muscle groups. See objective measures.     Time  4    Period  Weeks    Status  On-going      PT SHORT TERM GOAL #3   Title  Patient will demonstrate ability to maintain single  limb stance for 3 seconds on each lower extremity to assist with stair ambulation.     Baseline  05/12/17: Patient is unable to maintain SLS for 1 second on either lower extremity.     Time  4    Period  Weeks    Status  On-going        PT Long Term Goals - 05/12/17 1359      PT LONG TERM GOAL #1   Title  Patient will demonstrate improvement in MMT strength of 1 grade in all tested musculature deficient at evalution to assist patient with ambulation and other fucntional mobility.     Baseline  05/12/17: Patient has not improved by 1 MMT grade. See objective measures.     Time  8    Period  Weeks    Status  New      PT LONG TERM GOAL #2   Title  Patient will demonstrate improvement in ability to perform 5 x sit to stand by 10 seconds indicating improvement in balance and functional mobility.     Baseline  05/12/17: Patient's 5 x sit to stand time improved by 3.06 seconds from evaluation.     Time  8    Period  Weeks    Status  On-going      PT LONG TERM GOAL #3   Title  Patient will demonstate ability to ambulate an improvement of 100 feet on the 3 minute walk test demonstrating improved velocity and safety with ambulation.     Baseline  05/12/17: Patient improved distance ambulated on 3MWT by 42 feet.     Time  8    Period  Weeks    Status  On-going            Plan - 05/26/17 1613    Clinical Impression Statement  This session focused on improving patient's lower extremity strength and balance activities. This session added standing hip extension strengthening on the hip extension machine. This session patient also performed sidestepping inside the parallel bars with red theraband around her ankles to improve lower extremity strength and balance. Patient reported no increase in pain at end of session and tolerated all exercises well.     Rehab Potential  Fair    Clinical Impairments Affecting Rehab Potential  Positive: support system, motivated; Negative: progressive nature of PD;  comorbidities     PT Frequency  2x / week    PT Duration  8 weeks    PT Treatment/Interventions  ADLs/Self Care Home Management;Aquatic Therapy;Moist Heat;DME Instruction;Gait training;Stair training;Functional mobility training;Therapeutic activities;Therapeutic exercise;Balance training;Neuromuscular re-education;Patient/family education;Orthotic Fit/Training;Manual techniques;Cryotherapy;Passive range of motion;Dry needling;Energy conservation;Taping;Visual/perceptual remediation/compensation    PT Next Visit Plan  Focus on proximal hip strengthening particularly hip extension. Continue use of metronome to assist with gait speed. If limited by LBP complete seated activities on dynadisc or allow rest breaks. Continue trunk stretches to decrease rigidity and improve trunk mobility with gait. Continue postural exercises.       PT Home Exercise Plan  04/15/17: Supine HS stretch, bridges and supine SLR.  05/14/17: Sidelying clamshells 1 x 15 each lower extremity       Patient will benefit from skilled therapeutic intervention in order to improve the following deficits and impairments:  Abnormal gait, Decreased balance, Decreased endurance, Decreased mobility, Difficulty walking, Improper body mechanics, Decreased activity tolerance, Decreased coordination, Decreased strength, Impaired flexibility, Postural dysfunction, Pain  Visit Diagnosis: Unsteadiness on feet  Other abnormalities of gait and mobility  Repeated falls  Other symptoms and signs involving the musculoskeletal system     Problem List Patient Active Problem List   Diagnosis Date Noted  . Syncope 10/12/2014  . Diabetes ([place]) 10/12/2014  . Hypertension 10/12/2014  . OSTEOARTHRITIS, 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  . TRIGGER FINGER 04/27/2007   Clarene Critchley PT, DPT 4:21 PM, 05/26/17 [place], [place], [address] Phone: [phone]   Fax:  [phone]  Name:  [name] MRN: [id number] Date of Birth: 1940/11/10

## 2017-05-28 ENCOUNTER — Ambulatory Visit (HOSPITAL_COMMUNITY): Payer: Medicare Other | Admitting: Physical Therapy

## 2017-05-28 ENCOUNTER — Encounter (HOSPITAL_COMMUNITY): Payer: Self-pay | Admitting: Physical Therapy

## 2017-05-28 DIAGNOSIS — R2681 Unsteadiness on feet: Secondary | ICD-10-CM

## 2017-05-28 DIAGNOSIS — R296 Repeated falls: Secondary | ICD-10-CM

## 2017-05-28 DIAGNOSIS — R29898 Other symptoms and signs involving the musculoskeletal system: Secondary | ICD-10-CM

## 2017-05-28 DIAGNOSIS — R2689 Other abnormalities of gait and mobility: Secondary | ICD-10-CM

## 2017-05-28 NOTE — Therapy (Signed)
[name], [place], [address] Phone: [phone]   Fax:  [phone]  Physical Therapy Treatment  Patient Details  Name: [name] MRN: [id number] Date of Birth: 05-27-1940 Referring Provider: Phillips Odor MD   Encounter Date: 05/28/2017  PT End of Session - 05/28/17 1540    Visit Number  13    Number of Visits  17    Date for PT Re-Evaluation  06/09/17 reassessment complete 05/12/17    Authorization Type  [employer]HC Medicare; Secondary: Medicaid [place] Time Period  04/14/17 - 06/09/17    Authorization - Visit Number  5    Authorization - Number of Visits  10    PT Start Time  1301    PT Stop Time  1345 4 minutes unbilled for nustep    PT Time Calculation (min)  44 min    Equipment Utilized During Treatment  Gait belt    Activity Tolerance  Patient tolerated treatment well;No increased pain    Behavior During Therapy  WFL for tasks assessed/performed       Past Medical History:  Diagnosis Date  . Diabetes mellitus   . GERD (gastroesophageal reflux disease)   . Hypertension   . Parkinson disease ([place])   . Scoliosis   . Scoliosis   . UTI (lower urinary tract infection)     Past Surgical History:  Procedure Laterality Date  . boil Left    boil removed from left cheek  . EYE SURGERY     Retina Detachment  . MASS EXCISION N/A 06/27/2014   Procedure: EXCISION SUBMANDIBULAR STONE;  Surgeon: Colleen Gala, MD;  Location: [facility] OR;  Service: ENT;  Laterality: N/A;  . sublingual gland removal  2016  . 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  Subjective Assessment - 05/28/17 1306    Subjective  Patient reported she is having back pain as usual. She stated that she tries to do her exercises at home.     Limitations  Standing;Walking;House hold activities    Patient Stated Goals  Improve balance and walking     Currently in Pain?  Yes    Pain Score  9     Pain  Location  Back    Pain Orientation  Lower    Pain Descriptors / Indicators  Aching    Pain Type  Chronic pain    Pain Onset  More than a month ago    Aggravating Factors   standing/walking    Pain Relieving Factors  sitting                       [facility] Adult PT Treatment/Exercise - 05/28/17 0001      Knee/Hip Exercises: Aerobic   Nustep  4' reciprocal UE/LE L1 SPM 39 (not included in charges)      Knee/Hip Exercises: Standing   Gait Training  180 feet and then sitting break and additional 46 feet with metronome at 68 BPM cues to stand tall and act as if balancing books on head. Figure 8 with 2 cones set 8 feet apart patient performing cognitive dual task while performing figure 8 on 4 trials.     Other Standing Knee Exercises  Standing postural strengthening rows, shoulder extension and scapular retraction with red theraband 3 x 10 sets moderate tactile and verbal cues for form  Knee/Hip Exercises: Seated   Long Arc Quad  Strengthening;Right;Left;2 sets;10 reps;Other (comment) 2# ankle weights    Other Seated Knee/Hip Exercises  Seated marching x 20 alternating lower extremities with 2# ankle weights. Patient sitting on Dyna disc.    Sit to Sand  10 reps;without UE support Verbal cues to decrease anterior trunk lean          Balance Exercises - 05/28/17 1325      Balance Exercises: Standing   Step Over Hurdles / Cones  inside parallel bars over 3 6 inch hurdles 2 roundtrips forward and 2 round trips sideways and sidestep (meet feet)  intermittent HHA        PT Education - 05/28/17 1329    Education provided  Yes    Education Details  Patient was educated on the purpose and technique of exercises throughout.     Person(s) Educated  Patient    Methods  Explanation;Demonstration;Verbal cues;Tactile cues    Comprehension  Verbalized understanding;Returned demonstration;Verbal cues required;Tactile cues required       PT Short Term Goals - 05/12/17 1358       PT SHORT TERM GOAL #1   Title  Patient and patient's family will demonstrate understanding and report regular compliance with home exercise program to improve patient's balance, flexibility, and strength.     Time  4    Period  Weeks    Status  On-going      PT SHORT TERM GOAL #2   Title  Patient will demonstrate improvement in MMT strength of 1/2 grade in all tested musculature deficient at evalution to assist patient with ambulation and other fucntional mobility.     Baseline  05/12/17: Patient improved in some, but not all muscle groups. See objective measures.     Time  4    Period  Weeks    Status  On-going      PT SHORT TERM GOAL #3   Title  Patient will demonstrate ability to maintain single limb stance for 3 seconds on each lower extremity to assist with stair ambulation.     Baseline  05/12/17: Patient is unable to maintain SLS for 1 second on either lower extremity.     Time  4    Period  Weeks    Status  On-going        PT Long Term Goals - 05/12/17 1359      PT LONG TERM GOAL #1   Title  Patient will demonstrate improvement in MMT strength of 1 grade in all tested musculature deficient at evalution to assist patient with ambulation and other fucntional mobility.     Baseline  05/12/17: Patient has not improved by 1 MMT grade. See objective measures.     Time  8    Period  Weeks    Status  New      PT LONG TERM GOAL #2   Title  Patient will demonstrate improvement in ability to perform 5 x sit to stand by 10 seconds indicating improvement in balance and functional mobility.     Baseline  05/12/17: Patient's 5 x sit to stand time improved by 3.06 seconds from evaluation.     Time  8    Period  Weeks    Status  On-going      PT LONG TERM GOAL #3   Title  Patient will demonstate ability to ambulate an improvement of 100 feet on the 3 minute walk test demonstrating improved velocity and safety with  ambulation.     Baseline  05/12/17: Patient improved distance ambulated on  3MWT by 42 feet.     Time  8    Period  Weeks    Status  On-going            Plan - 05/28/17 1541    Clinical Impression Statement  This session continued with established plan of care. Patient reported increased back pain this session and exercises were modified to patient's tolerance. This session increased sets with postural strengthening exercises. With gait training patient was able to perform ambulation at 68 BPM. In addition, this session added ambulation with dual task through figure-8 pattern. Patient denied any dizziness or lightheadedness throughout session. Plan to continue progression of gait training, strengthening, and balance exercises.     Rehab Potential  Fair    Clinical Impairments Affecting Rehab Potential  Positive: support system, motivated; Negative: progressive nature of PD; comorbidities     PT Frequency  2x / week    PT Duration  8 weeks    PT Treatment/Interventions  ADLs/Self Care Home Management;Aquatic Therapy;Moist Heat;DME Instruction;Gait training;Stair training;Functional mobility training;Therapeutic activities;Therapeutic exercise;Balance training;Neuromuscular re-education;Patient/family education;Orthotic Fit/Training;Manual techniques;Cryotherapy;Passive range of motion;Dry needling;Energy conservation;Taping;Visual/perceptual remediation/compensation    PT Next Visit Plan  Focus on proximal hip strengthening particularly hip extension. Continue use of metronome to assist with gait speed. If limited by LBP complete seated activities on dynadisc or allow rest breaks. Continue trunk stretches to decrease rigidity and improve trunk mobility with gait. Continue postural exercises.       PT Home Exercise Plan  04/15/17: Supine HS stretch, bridges and supine SLR.  05/14/17: Sidelying clamshells 1 x 15 each lower extremity    Consulted and Agree with Plan of Care  Patient       Patient will benefit from skilled therapeutic intervention in order to improve the  following deficits and impairments:  Abnormal gait, Decreased balance, Decreased endurance, Decreased mobility, Difficulty walking, Improper body mechanics, Decreased activity tolerance, Decreased coordination, Decreased strength, Impaired flexibility, Postural dysfunction, Pain  Visit Diagnosis: Unsteadiness on feet  Other abnormalities of gait and mobility  Repeated falls  Other symptoms and signs involving the musculoskeletal system     Problem List Patient Active Problem List   Diagnosis Date Noted  . Syncope 10/12/2014  . Diabetes ([place]) 10/12/2014  . Hypertension 10/12/2014  . OSTEOARTHRITIS, 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  . TRIGGER FINGER 04/27/2007   Clarene Critchley PT, DPT 3:43 PM, 05/28/17 [place] [address] [place], [place], [address] Phone: [phone]   Fax:  [phone]  Name: RENESHIA ZUCCARO MRN: [id number] Date of Birth: 1940/04/03

## 2017-06-01 ENCOUNTER — Telehealth (HOSPITAL_COMMUNITY): Payer: Self-pay | Admitting: Physical Therapy

## 2017-06-01 ENCOUNTER — Ambulatory Visit (HOSPITAL_COMMUNITY): Payer: Medicare Other | Admitting: Physical Therapy

## 2017-06-01 NOTE — Telephone Encounter (Signed)
Therapist called and left a message regarding patient not showing up for appointment at 11:15 this morning. Therapist stated that she hoped that everything was okay. Therapist reminded patient of the next therapy session and reminded her of what the phone number is at the clinic. Call completed around 11:43 am.   Clarene Critchley PT, DPT 11:46 AM, 06/01/17 [phone]

## 2017-06-02 ENCOUNTER — Ambulatory Visit (HOSPITAL_COMMUNITY): Payer: Medicare Other | Admitting: Physical Therapy

## 2017-06-02 ENCOUNTER — Encounter (HOSPITAL_COMMUNITY): Payer: Self-pay | Admitting: Physical Therapy

## 2017-06-02 DIAGNOSIS — R2689 Other abnormalities of gait and mobility: Secondary | ICD-10-CM | POA: Diagnosis not present

## 2017-06-02 DIAGNOSIS — R296 Repeated falls: Secondary | ICD-10-CM | POA: Diagnosis not present

## 2017-06-02 DIAGNOSIS — R2681 Unsteadiness on feet: Secondary | ICD-10-CM | POA: Diagnosis not present

## 2017-06-02 DIAGNOSIS — R29898 Other symptoms and signs involving the musculoskeletal system: Secondary | ICD-10-CM | POA: Diagnosis not present

## 2017-06-02 NOTE — Therapy (Signed)
[place], [place], [address] Phone: [phone]   Fax:  [phone]  Physical Therapy Treatment  Patient Details  Name: [name] MRN: [id number] Date of Birth: 08-Nov-1940 Referring Provider: Phillips Odor MD   Encounter Date: 06/02/2017  PT End of Session - 06/02/17 1344    Visit Number  14    Number of Visits  17    Date for PT Re-Evaluation  06/09/17 reassessment complete 05/12/17    Authorization Type  [employer]HC Medicare; Secondary: Medicaid [place] Time Period  04/14/17 - 06/09/17    Authorization - Visit Number  6    Authorization - Number of Visits  10    PT Start Time  1300    PT Stop Time  1339    PT Time Calculation (min)  39 min    Equipment Utilized During Treatment  Gait belt    Activity Tolerance  Patient tolerated treatment well;No increased pain    Behavior During Therapy  WFL for tasks assessed/performed       Past Medical History:  Diagnosis Date  . Diabetes mellitus   . GERD (gastroesophageal reflux disease)   . Hypertension   . Parkinson disease ([place])   . Scoliosis   . Scoliosis   . UTI (lower urinary tract infection)     Past Surgical History:  Procedure Laterality Date  . boil Left    boil removed from left cheek  . EYE SURGERY     Retina Detachment  . MASS EXCISION N/A 06/27/2014   Procedure: EXCISION SUBMANDIBULAR STONE;  Surgeon: Izora Gala, MD;  Location[facility] OR;  Service: ENT;  Laterality: N/A;  . sublingual gland removal  2016  . 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  Subjective Assessment - 06/02/17 1312    Subjective  Patient reported that she is having back pain as usual. She reported that she has some muscle soreness in her right leg, but does not quantify.     Limitations  Standing;Walking;House hold activities    Patient Stated Goals  Improve balance and walking     Currently in Pain?  Yes    Pain Score  9     Pain Location  Back    Pain Orientation  Lower    Pain Descriptors / Indicators  Aching    Pain Type  Chronic pain    Pain Onset  More than a month ago                       [facility] Adult PT Treatment/Exercise - 06/02/17 0001      Ambulation/Gait   Ambulation/Gait  Yes    Ambulation/Gait Assistance  4: Min guard    Ambulation Distance (Feet)  180 Feet    Assistive device  Straight cane    Gait Comments  Verbal cues to ambulate like a stack of books is on her head. Metronome set for 68 BPM.       Knee/Hip Exercises: Stretches   Passive Hamstring Stretch  Right;Left;3 reps;30 seconds;Other (comment) On 4 inch step      Knee/Hip Exercises: Standing   Gait Training  Figure 8 with 2 cones set 8 feet apart patient performing cognitive dual task while performing figure 8 on 4 trials.      Knee/Hip Exercises: Seated   Long Arc Quad  Strengthening;Right;Left;2 sets;10 reps;Other (  comment) with 2# ankle weights    Other Seated Knee/Hip Exercises  Scapular retraction and rows 2 x 10 with red theraband    Other Seated Knee/Hip Exercises  Seated marching x 20 alternating lower extremities with 2# ankle weights. Patient sitting on Dyna disc.    Sit to Sand  10 reps;without UE support Verbal cues to not use UE and for form          Balance Exercises - 06/02/17 1332      Balance Exercises: Standing   Step Over Hurdles / Cones  inside parallel bars ambulating over  6 inch hurdles 3 roundtrips forward and 2 round trips sideways          PT Short Term Goals - 05/12/17 1358      PT SHORT TERM GOAL #1   Title  Patient and patient's family will demonstrate understanding and report regular compliance with home exercise program to improve patient's balance, flexibility, and strength.     Time  4    Period  Weeks    Status  On-going      PT SHORT TERM GOAL #2   Title  Patient will demonstrate improvement in MMT strength of 1/2 grade in all tested musculature deficient at  evalution to assist patient with ambulation and other fucntional mobility.     Baseline  05/12/17: Patient improved in some, but not all muscle groups. See objective measures.     Time  4    Period  Weeks    Status  On-going      PT SHORT TERM GOAL #3   Title  Patient will demonstrate ability to maintain single limb stance for 3 seconds on each lower extremity to assist with stair ambulation.     Baseline  05/12/17: Patient is unable to maintain SLS for 1 second on either lower extremity.     Time  4    Period  Weeks    Status  On-going        PT Long Term Goals - 05/12/17 1359      PT LONG TERM GOAL #1   Title  Patient will demonstrate improvement in MMT strength of 1 grade in all tested musculature deficient at evalution to assist patient with ambulation and other fucntional mobility.     Baseline  05/12/17: Patient has not improved by 1 MMT grade. See objective measures.     Time  8    Period  Weeks    Status  New      PT LONG TERM GOAL #2   Title  Patient will demonstrate improvement in ability to perform 5 x sit to stand by 10 seconds indicating improvement in balance and functional mobility.     Baseline  05/12/17: Patient's 5 x sit to stand time improved by 3.06 seconds from evaluation.     Time  8    Period  Weeks    Status  On-going      PT LONG TERM GOAL #3   Title  Patient will demonstate ability to ambulate an improvement of 100 feet on the 3 minute walk test demonstrating improved velocity and safety with ambulation.     Baseline  05/12/17: Patient improved distance ambulated on 3MWT by 42 feet.     Time  8    Period  Weeks    Status  On-going            Plan - 06/02/17 1423    Clinical Impression Statement  This session continued with established plan of care. Patient performed additional forward stepping over hurdles this session. Patient performed ambulation with figure-8 and listing states this session to challenge cognitive dual task. This session patient  performed seated postural strengthening exercises this session due to back pain. Plan to continue progress with exercises and gait training to reach functional goals.     Rehab Potential  Fair    Clinical Impairments Affecting Rehab Potential  Positive: support system, motivated; Negative: progressive nature of PD; comorbidities     PT Frequency  2x / week    PT Duration  8 weeks    PT Treatment/Interventions  ADLs/Self Care Home Management;Aquatic Therapy;Moist Heat;DME Instruction;Gait training;Stair training;Functional mobility training;Therapeutic activities;Therapeutic exercise;Balance training;Neuromuscular re-education;Patient/family education;Orthotic Fit/Training;Manual techniques;Cryotherapy;Passive range of motion;Dry needling;Energy conservation;Taping;Visual/perceptual remediation/compensation    PT Next Visit Plan  Focus on proximal hip strengthening particularly hip extension. Continue use of metronome to assist with gait speed. If limited by LBP complete seated activities on dynadisc or allow rest breaks. Continue trunk stretches to decrease rigidity and improve trunk mobility with gait. Continue postural exercises.       PT Home Exercise Plan  04/15/17: Supine HS stretch, bridges and supine SLR.  05/14/17: Sidelying clamshells 1 x 15 each lower extremity    Consulted and Agree with Plan of Care  Patient       Patient will benefit from skilled therapeutic intervention in order to improve the following deficits and impairments:  Abnormal gait, Decreased balance, Decreased endurance, Decreased mobility, Difficulty walking, Improper body mechanics, Decreased activity tolerance, Decreased coordination, Decreased strength, Impaired flexibility, Postural dysfunction, Pain  Visit Diagnosis: Unsteadiness on feet  Other abnormalities of gait and mobility  Repeated falls  Other symptoms and signs involving the musculoskeletal system     Problem List Patient Active Problem List    Diagnosis Date Noted  . Syncope 10/12/2014  . Diabetes ([place]) 10/12/2014  . Hypertension 10/12/2014  . OSTEOARTHRITIS, 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  . TRIGGER FINGER 04/27/2007   Clarene Critchley PT, DPT 2:27 PM, 06/02/17 [place], [place], [address] Phone: [phone]   Fax:  [phone]  Name: EMBERLI BALLESTER MRN: [id number] Date of Birth: 30-Nov-1940

## 2017-06-04 ENCOUNTER — Encounter (HOSPITAL_COMMUNITY): Payer: Self-pay

## 2017-06-04 ENCOUNTER — Ambulatory Visit (HOSPITAL_COMMUNITY): Payer: Medicare Other

## 2017-06-04 DIAGNOSIS — R2681 Unsteadiness on feet: Secondary | ICD-10-CM

## 2017-06-04 DIAGNOSIS — R29898 Other symptoms and signs involving the musculoskeletal system: Secondary | ICD-10-CM | POA: Diagnosis not present

## 2017-06-04 DIAGNOSIS — R2689 Other abnormalities of gait and mobility: Secondary | ICD-10-CM

## 2017-06-04 DIAGNOSIS — R296 Repeated falls: Secondary | ICD-10-CM

## 2017-06-04 NOTE — Therapy (Signed)
[place], [place], [address] Phone: [phone]   Fax:  [phone]  Physical Therapy Treatment  Patient Details  Name: [name] MRN: [id number] Date of Birth: [birth year] Referring Provider: Phillips Odor MD   Encounter Date: 06/04/2017  PT End of Session - 06/04/17 1033    Visit Number  15    Number of Visits  17    Date for PT Re-Evaluation  06/09/17 reassessment complete 05/12/17    Authorization Type  [employer]HC Medicare; Secondary: Medicaid [place] Time Period  04/14/17 - 06/09/17    Authorization - Visit Number  7    Authorization - Number of Visits  10    PT Start Time  3151    PT Stop Time  1115    PT Time Calculation (min)  42 min    Equipment Utilized During Treatment  Gait belt    Activity Tolerance  Patient tolerated treatment well;No increased pain    Behavior During Therapy  WFL for tasks assessed/performed       Past Medical History:  Diagnosis Date  . Diabetes mellitus   . GERD (gastroesophageal reflux disease)   . Hypertension   . Parkinson disease ([place])   . Scoliosis   . Scoliosis   . UTI (lower urinary tract infection)     Past Surgical History:  Procedure Laterality Date  . boil Left    boil removed from left cheek  . EYE SURGERY     Retina Detachment  . MASS EXCISION N/A 06/27/2014   Procedure: EXCISION SUBMANDIBULAR STONE;  Surgeon: Izora Gala, MD;  Location: [facility] OR;  Service: ENT;  Laterality: N/A;  . sublingual gland removal  2016  . 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  Subjective Assessment - 06/04/17 1039    Subjective  Pt states that her back is still hurting and rates it a 9/10. Otherwise, she is not feeling too bad.    Limitations  Standing;Walking;House hold activities    Patient Stated Goals  Improve balance and walking     Currently in Pain?  Yes    Pain Score  9     Pain Location  Back    Pain Orientation   Lower    Pain Descriptors / Indicators  Aching    Pain Type  Chronic pain    Pain Onset  More than a month ago    Pain Frequency  Constant    Aggravating Factors   stadning, walking    Pain Relieving Factors  sitting    Effect of Pain on Daily Activities  increase              [facility] Adult PT Treatment/Exercise - 06/04/17 0001      Ambulation/Gait   Ambulation/Gait  Yes    Ambulation/Gait Assistance  5: Supervision    Ambulation Distance (Feet)  180 Feet    Assistive device  Straight cane    Gait Comments  Verbal cues to ambulate like a stack of books is on her head. Metronome set for 68 BPM.       Knee/Hip Exercises: Stretches   Passive Hamstring Stretch  Right;Left;3 reps;30 seconds;Other (comment)    Passive Hamstring Stretch Limitations  4" step    Gastroc Stretch  Both;3 reps;30 seconds    Gastroc Stretch Limitations  slant board      Knee/Hip Exercises: Standing  Heel Raises  --    Heel Raises Limitations  --    Forward Step Up  Both;10 reps;Hand Hold: 2;Step Height: 4"      Knee/Hip Exercises: Seated   Long Arc Quad  Both;15 reps;Weights;Limitations    Long Arc Quad Weight  3 lbs.    Long [employer] Limitations  seated on dyna disc    Marching  Both;15 reps;Weights;Limitations    Marching Limitations  on dyna disc    Marching Weights  3 lbs.       Balance Exercises - 06/04/17 1119      Balance Exercises: Standing   Step Over Hurdles / Cones  x4RT fwd, x2RT lateral +dual task; in // bars            PT Education - 06/04/17 1040    Education provided  Yes    Education Details  exercise technique, continue HEP    Person(s) Educated  Patient    Methods  Explanation;Demonstration    Comprehension  Verbalized understanding;Returned demonstration       PT Short Term Goals - 05/12/17 1358      PT SHORT TERM GOAL #1   Title  Patient and patient's family will demonstrate understanding and report regular compliance with home exercise program to improve  patient's balance, flexibility, and strength.     Time  4    Period  Weeks    Status  On-going      PT SHORT TERM GOAL #2   Title  Patient will demonstrate improvement in MMT strength of 1/2 grade in all tested musculature deficient at evalution to assist patient with ambulation and other fucntional mobility.     Baseline  05/12/17: Patient improved in some, but not all muscle groups. See objective measures.     Time  4    Period  Weeks    Status  On-going      PT SHORT TERM GOAL #3   Title  Patient will demonstrate ability to maintain single limb stance for 3 seconds on each lower extremity to assist with stair ambulation.     Baseline  05/12/17: Patient is unable to maintain SLS for 1 second on either lower extremity.     Time  4    Period  Weeks    Status  On-going        PT Long Term Goals - 05/12/17 1359      PT LONG TERM GOAL #1   Title  Patient will demonstrate improvement in MMT strength of 1 grade in all tested musculature deficient at evalution to assist patient with ambulation and other fucntional mobility.     Baseline  05/12/17: Patient has not improved by 1 MMT grade. See objective measures.     Time  8    Period  Weeks    Status  New      PT LONG TERM GOAL #2   Title  Patient will demonstrate improvement in ability to perform 5 x sit to stand by 10 seconds indicating improvement in balance and functional mobility.     Baseline  05/12/17: Patient's 5 x sit to stand time improved by 3.06 seconds from evaluation.     Time  8    Period  Weeks    Status  On-going      PT LONG TERM GOAL #3   Title  Patient will demonstate ability to ambulate an improvement of 100 feet on the 3 minute walk test demonstrating improved velocity and  safety with ambulation.     Baseline  05/12/17: Patient improved distance ambulated on 3MWT by 42 feet.     Time  8    Period  Weeks    Status  On-going            Plan - 06/04/17 1120    Clinical Impression Statement  Continued with  established POC focusing on gait training, balance, and overall functional strength. Added heel and toe raises along with step ups for glute strengthening in order to maximize gait. Pt continues to be challenged with dynamic gait and dual tasking. Added dual task to fwd and lateral hurdles this date and pt was challenged by this. Pt reporting continued LBP at EOS. Continue POC as planned, progressing as able.    Rehab Potential  Fair    Clinical Impairments Affecting Rehab Potential  Positive: support system, motivated; Negative: progressive nature of PD; comorbidities     PT Frequency  2x / week    PT Duration  8 weeks    PT Treatment/Interventions  ADLs/Self Care Home Management;Aquatic Therapy;Moist Heat;DME Instruction;Gait training;Stair training;Functional mobility training;Therapeutic activities;Therapeutic exercise;Balance training;Neuromuscular re-education;Patient/family education;Orthotic Fit/Training;Manual techniques;Cryotherapy;Passive range of motion;Dry needling;Energy conservation;Taping;Visual/perceptual remediation/compensation    PT Next Visit Plan  add heel and toe raises in stadning; Focus on proximal hip strengthening particularly hip extension. Continue use of metronome to assist with gait speed. If limited by LBP complete seated activities on dynadisc or allow rest breaks. Continue trunk stretches to decrease rigidity and improve trunk mobility with gait. Continue postural exercises.     PT Home Exercise Plan  04/15/17: Supine HS stretch, bridges and supine SLR.  05/14/17: Sidelying clamshells 1 x 15 each lower extremity    Consulted and Agree with Plan of Care  Patient       Patient will benefit from skilled therapeutic intervention in order to improve the following deficits and impairments:  Abnormal gait, Decreased balance, Decreased endurance, Decreased mobility, Difficulty walking, Improper body mechanics, Decreased activity tolerance, Decreased coordination, Decreased  strength, Impaired flexibility, Postural dysfunction, Pain  Visit Diagnosis: Unsteadiness on feet  Other abnormalities of gait and mobility  Repeated falls  Other symptoms and signs involving the musculoskeletal system     Problem List Patient Active Problem List   Diagnosis Date Noted  . Syncope 10/12/2014  . Diabetes ([place]) 10/12/2014  . Hypertension 10/12/2014  . OSTEOARTHRITIS, 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  . TRIGGER FINGER 04/27/2007       Geraldine Solar PT, DPT  [place] [address] [place], [place], [address] Phone: [phone]   Fax:  [phone]  Name: [name] MRN: [id number] Date of Birth: 09/18/1940

## 2017-06-08 ENCOUNTER — Ambulatory Visit (HOSPITAL_COMMUNITY): Payer: Medicare Other | Attending: Neurology | Admitting: Physical Therapy

## 2017-06-08 ENCOUNTER — Encounter (HOSPITAL_COMMUNITY): Payer: Self-pay | Admitting: Physical Therapy

## 2017-06-08 DIAGNOSIS — R2681 Unsteadiness on feet: Secondary | ICD-10-CM | POA: Diagnosis not present

## 2017-06-08 DIAGNOSIS — R2689 Other abnormalities of gait and mobility: Secondary | ICD-10-CM | POA: Insufficient documentation

## 2017-06-08 DIAGNOSIS — R296 Repeated falls: Secondary | ICD-10-CM | POA: Insufficient documentation

## 2017-06-08 DIAGNOSIS — R29898 Other symptoms and signs involving the musculoskeletal system: Secondary | ICD-10-CM | POA: Diagnosis not present

## 2017-06-08 NOTE — Therapy (Signed)
[place], [place], [address] Phone: [phone]   Fax:  [phone]  Physical Therapy Treatment  Patient Details  Name: [name] MRN: [id number] Date of Birth: Jan 04, 1941 Referring Provider: Phillips Odor MD   Encounter Date: 06/08/2017  PT End of Session - 06/08/17 1205    Visit Number  16    Number of Visits  17    Date for PT Re-Evaluation  06/09/17 reassessment complete 05/12/17    Authorization Type  UHC Medicare; Secondary: Medicaid [place] Time Period  04/14/17 - 06/09/17    Authorization - Visit Number  8    Authorization - Number of Visits  10    PT Start Time  9030    PT Stop Time  1201    PT Time Calculation (min)  46 min    Equipment Utilized During Treatment  Gait belt    Activity Tolerance  Patient tolerated treatment well;No increased pain    Behavior During Therapy  WFL for tasks assessed/performed       Past Medical History:  Diagnosis Date  . Diabetes mellitus   . GERD (gastroesophageal reflux disease)   . Hypertension   . Parkinson disease ([place])   . Scoliosis   . Scoliosis   . UTI (lower urinary tract infection)     Past Surgical History:  Procedure Laterality Date  . boil Left    boil removed from left cheek  . EYE SURGERY     Retina Detachment  . MASS EXCISION N/A 06/27/2014   Procedure: EXCISION SUBMANDIBULAR STONE;  Surgeon: Izora Gala, MD;  Location: [facility] OR;  Service: ENT;  Laterality: N/A;  . sublingual gland removal  2016  . 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  Subjective Assessment - 06/08/17 1123    Subjective  Patient reported that she is feeling a little better today and that her back pain is an 8/10 today.     Limitations  Standing;Walking;House hold activities    Patient Stated Goals  Improve balance and walking     Currently in Pain?  Yes    Pain Score  8     Pain Location  Back    Pain Orientation   Lower    Pain Descriptors / Indicators  Aching    Pain Type  Chronic pain    Pain Onset  More than a month ago                       [facility] Adult PT Treatment/Exercise - 06/08/17 0001      Ambulation/Gait   Ambulation/Gait  Yes    Ambulation/Gait Assistance  5: Supervision    Ambulation Distance (Feet)  226 Feet    Assistive device  Straight cane    Gait Pattern  Decreased arm swing - right;Decreased arm swing - left;Decreased stride length;Decreased hip/knee flexion - right;Decreased hip/knee flexion - left;Decreased dorsiflexion - right;Decreased dorsiflexion - left;Trunk flexed    Gait Comments  Metronome set at 68 BPM. Verbal cues to stand tall.       Knee/Hip Exercises: Stretches   Passive Hamstring Stretch  Right;Left;3 reps;30 seconds;Other (comment)    Passive Hamstring Stretch Limitations  4" step    Gastroc Stretch  Both;3 reps;30 seconds    Gastroc Stretch Limitations  slant board      Knee/Hip Exercises: Standing  Hip Extension  Stengthening;Right;Left;2 sets;10 reps    Extension Limitations  At hip extension machine 2 x 10 9# verbal cues and tactile cues to maintain upright posture and keep knee straight    Forward Step Up  Both;10 reps;Hand Hold: 2;Step Height: 4" verbal cues and tactile cues for proper form      Knee/Hip Exercises: Seated   Long Arc Quad  Both;15 reps;Weights;Limitations    Long Arc Quad Weight  3 lbs. 2 second holds    Long [employer] Limitations  seated on dyna disc    Marching  Both;15 reps;Weights;Limitations    Marching Limitations  on dyna disc alternating legs    Marching Weights  3 lbs.          Balance Exercises - 06/08/17 1142      Balance Exercises: Standing   Step Over Hurdles / [place]  Inside parallel bars stepping over 4 6-inch hurdles x3 roundtrips fwd, x3 round trips lateral  with cognitive dual task with intermittent upper extremity assistance        PT Education - 06/08/17 1124    Education provided  Yes     Education Details  Exercise purpose and technique, and continue with HEP.     Person(s) Educated  Patient    Methods  Explanation;Demonstration    Comprehension  Verbalized understanding;Returned demonstration       PT Short Term Goals - 05/12/17 1358      PT SHORT TERM GOAL #1   Title  Patient and patient's family will demonstrate understanding and report regular compliance with home exercise program to improve patient's balance, flexibility, and strength.     Time  4    Period  Weeks    Status  On-going      PT SHORT TERM GOAL #2   Title  Patient will demonstrate improvement in MMT strength of 1/2 grade in all tested musculature deficient at evalution to assist patient with ambulation and other fucntional mobility.     Baseline  05/12/17: Patient improved in some, but not all muscle groups. See objective measures.     Time  4    Period  Weeks    Status  On-going      PT SHORT TERM GOAL #3   Title  Patient will demonstrate ability to maintain single limb stance for 3 seconds on each lower extremity to assist with stair ambulation.     Baseline  05/12/17: Patient is unable to maintain SLS for 1 second on either lower extremity.     Time  4    Period  Weeks    Status  On-going        PT Long Term Goals - 05/12/17 1359      PT LONG TERM GOAL #1   Title  Patient will demonstrate improvement in MMT strength of 1 grade in all tested musculature deficient at evalution to assist patient with ambulation and other fucntional mobility.     Baseline  05/12/17: Patient has not improved by 1 MMT grade. See objective measures.     Time  8    Period  Weeks    Status  New      PT LONG TERM GOAL #2   Title  Patient will demonstrate improvement in ability to perform 5 x sit to stand by 10 seconds indicating improvement in balance and functional mobility.     Baseline  05/12/17: Patient's 5 x sit to stand time improved by 3.06 seconds from evaluation.  Time  8    Period  Weeks    Status   On-going      PT LONG TERM GOAL #3   Title  Patient will demonstate ability to ambulate an improvement of 100 feet on the 3 minute walk test demonstrating improved velocity and safety with ambulation.     Baseline  05/12/17: Patient improved distance ambulated on 3MWT by 42 feet.     Time  8    Period  Weeks    Status  On-going            Plan - 06/08/17 1207    Clinical Impression Statement  This session continued with established plan of care. Patient demonstrated decrease in speed of lateral stepping over hurdles once cognitive dual task was added. Patient also required frequent verbal and tactile cues for form throughout exercises. Patient was able to ambulate for 226 feet this session with the metronome set to 68 BPM. In addition, patient reported a slight decrease in her pain this session. Plan to re-assess patient at next session.     Rehab Potential  Fair    Clinical Impairments Affecting Rehab Potential  Positive: support system, motivated; Negative: progressive nature of PD; comorbidities     PT Frequency  2x / week    PT Duration  8 weeks    PT Treatment/Interventions  ADLs/Self Care Home Management;Aquatic Therapy;Moist Heat;DME Instruction;Gait training;Stair training;Functional mobility training;Therapeutic activities;Therapeutic exercise;Balance training;Neuromuscular re-education;Patient/family education;Orthotic Fit/Training;Manual techniques;Cryotherapy;Passive range of motion;Dry needling;Energy conservation;Taping;Visual/perceptual remediation/compensation    PT Next Visit Plan  Re-assess; consider adding heel and toe raises; Focus on proximal hip strengthening particularly hip extension. Continue use of metronome to assist with gait speed. If limited by LBP complete seated activities on dynadisc or allow rest breaks. Continue trunk stretches to decrease rigidity and improve trunk mobility with gait. Continue postural exercises.     PT Home Exercise Plan  04/15/17: Supine HS  stretch, bridges and supine SLR.  05/14/17: Sidelying clamshells 1 x 15 each lower extremity    Consulted and Agree with Plan of Care  Patient       Patient will benefit from skilled therapeutic intervention in order to improve the following deficits and impairments:  Abnormal gait, Decreased balance, Decreased endurance, Decreased mobility, Difficulty walking, Improper body mechanics, Decreased activity tolerance, Decreased coordination, Decreased strength, Impaired flexibility, Postural dysfunction, Pain  Visit Diagnosis: Unsteadiness on feet  Other abnormalities of gait and mobility  Repeated falls  Other symptoms and signs involving the musculoskeletal system     Problem List Patient Active Problem List   Diagnosis Date Noted  . Syncope 10/12/2014  . Diabetes ([place]) 10/12/2014  . Hypertension 10/12/2014  . OSTEOARTHRITIS, 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  . TRIGGER FINGER 04/27/2007   Clarene Critchley PT, DPT 12:10 PM, 06/08/17 [place], [place], [address] Phone: [phone]   Fax:  [phone]  Name: KARIYAH BAUGH MRN: [id number] Date of Birth: 09/03/1940

## 2017-06-10 ENCOUNTER — Encounter (HOSPITAL_COMMUNITY): Payer: Self-pay | Admitting: Physical Therapy

## 2017-06-10 ENCOUNTER — Ambulatory Visit (HOSPITAL_COMMUNITY): Payer: Medicare Other | Admitting: Physical Therapy

## 2017-06-10 DIAGNOSIS — R29898 Other symptoms and signs involving the musculoskeletal system: Secondary | ICD-10-CM | POA: Diagnosis not present

## 2017-06-10 DIAGNOSIS — R2689 Other abnormalities of gait and mobility: Secondary | ICD-10-CM | POA: Diagnosis not present

## 2017-06-10 DIAGNOSIS — R2681 Unsteadiness on feet: Secondary | ICD-10-CM | POA: Diagnosis not present

## 2017-06-10 DIAGNOSIS — R296 Repeated falls: Secondary | ICD-10-CM

## 2017-06-10 NOTE — Therapy (Signed)
[facility] [place] [address] [place], [place], [address] Phone: [phone]   Fax:  [phone]  Physical Therapy Treatment / Re-assessment / Discharge Summary  Patient Details  Name: [name] MRN: [id number] Date of Birth: 07/21/1940 Referring Provider: Phillips Odor MD   Encounter Date: 06/10/2017  Progress Note Reporting Period 05/12/17 to 06/10/17  See note below for Objective Data and Assessment of Progress/Goals.       PT End of Session - 06/10/17 1227    Visit Number  17    Number of Visits  17    Date for PT Re-Evaluation  06/09/17 reassessment complete 05/12/17    Authorization Type  UHC Medicare; Secondary: Medicaid [place] Time Period  04/14/17 - 06/09/17    Authorization - Visit Number  9    Authorization - Number of Visits  10    PT Start Time  1324    PT Stop Time  4010 Some time unbilled for re-assessment    PT Time Calculation (min)  40 min    Equipment Utilized During Treatment  Gait belt    Activity Tolerance  Patient tolerated treatment well;No increased pain    Behavior During Therapy  WFL for tasks assessed/performed       Past Medical History:  Diagnosis Date  . Diabetes mellitus   . GERD (gastroesophageal reflux disease)   . Hypertension   . Parkinson disease ([place])   . Scoliosis   . Scoliosis   . UTI (lower urinary tract infection)     Past Surgical History:  Procedure Laterality Date  . boil Left    boil removed from left cheek  . EYE SURGERY     Retina Detachment  . MASS EXCISION N/A 06/27/2014   Procedure: EXCISION SUBMANDIBULAR STONE;  Surgeon: Izora Gala, MD;  Location: [facility] OR;  Service: ENT;  Laterality: N/A;  . sublingual gland removal  2016  . 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  Subjective Assessment - 06/10/17 1132    Subjective  Patient reported that she is having back pain today which is a 9/10 again. Patient stated that she has  not had as many falls at home since she started physical therapy. Patient also acknowledged that she was pleased with her current functional status.     Limitations  Standing;Walking;House hold activities    How long can you sit comfortably?  unlimted    How long can you stand comfortably?  10-15 minutes    How long can you walk comfortably?  7 minutes    Patient Stated Goals  Improve balance and walking     Currently in Pain?  Yes    Pain Score  9     Pain Location  Back    Pain Orientation  Lower    Pain Descriptors / Indicators  Aching    Pain Type  Chronic pain    Pain Onset  More than a month ago    Aggravating Factors   Standing, walking    Pain Relieving Factors  sitting         [facility] PT Assessment - 06/10/17 0001      Assessment   Medical Diagnosis  Falling and gait impairments    Referring Provider  Phillips Odor MD    Next MD Visit  -- Patient unsure      Balance Screen   Has the patient  fallen in the past 6 months  Yes    How many times?  1    Has the patient had a decrease in activity level because of a fear of falling?   No    Is the patient reluctant to leave their home because of a fear of falling?   Yes      [place] residence    Living Arrangements  Children    Type of [place] to enter    Entrance Stairs-Number of Steps  5    Entrance Stairs-Rails  Right;Left;Can reach both    [place]  One level    [place] - 4 wheels;Cane - single point      Prior Function   Level of Independence  Needs assistance with ADLs;Requires assistive device for independence;Independent with household mobility without device;Independent with gait;Independent with transfers    Vocation  Retired      [employer]   Overall Cognitive Status  History of cognitive impairments - at baseline      Observation/Other Assessments   Focus on Therapeutic Outcomes (FOTO)   46% (54% limited)       Posture/Postural Control   Postural Limitations  Rounded Shoulders;Forward head;Increased thoracic kyphosis;Flexed trunk      Strength   Right Hip Flexion  4/5 was 4/5 then 4/5    Right Hip Extension  2+/5 was 2/5 then 2+/5    Right Hip ABduction  4+/5 was 4/5 then 4/5    Left Hip Flexion  4+/5 was 4/5 then 4/5    Left Hip Extension  2+/5 was 2+/5 then 2+/5    Left Hip ABduction  4+/5 was 3+/5 then 4/5    Right Knee Flexion  4+/5 was 4+/5 then 4+/5    Right Knee Extension  4+/5 was 4+/5 then 4+/5    Left Knee Flexion  4+/5 was 4+/5 then 4+/5    Left Knee Extension  4+/5 was 4+/5 then 4+/5    Right Ankle Dorsiflexion  5/5 was 4/5 then 4+/5    Right Ankle Plantar Flexion  2+/5 was 2+/5 then 2+/5    Left Ankle Dorsiflexion  5/5 was 4/5 then 4+/5    Left Ankle Plantar Flexion  2+/5 was 2/5 then 2+/5      Flexibility   Hamstrings  Limited bilaterally. With 90/90 test 35 degrees from 90 on the left and 40 degrees from 90 on the right.       Ambulation/Gait   Ambulation/Gait  Yes    Ambulation/Gait Assistance  5: Supervision    Ambulation Distance (Feet)  152 Feet 3MWT. no rest break    Assistive device  Straight cane    Gait Pattern  Decreased arm swing - right;Decreased arm swing - left;Decreased stride length;Decreased hip/knee flexion - right;Decreased hip/knee flexion - left;Decreased dorsiflexion - right;Decreased dorsiflexion - left;Trunk flexed      Static Standing Balance   Static Standing - Balance Support  No upper extremity supported    Static Standing Balance -  Activities   Single Leg Stance - Right Leg;Single Leg Stance - Left Leg    Static Standing - Comment/# of Minutes  SLS Rt: 3 seconds, SLS Lt: 1 second      Standardized Balance Assessment   Standardized Balance Assessment  Five Times Sit to Stand    Five times sit to stand comments   22.70 seconds  [facility] Adult PT Treatment/Exercise - 06/10/17 0001      Knee/Hip Exercises: Stretches    Passive Hamstring Stretch  30 seconds Each lower extremity. Review for HEP      Knee/Hip Exercises: Supine   Bridges  -- 2 repetitions review for HEP    Straight Leg Raises  -- 4 repetitions review for HEP each lower extremity      Knee/Hip Exercises: Sidelying   Clams  -- 5 repetitions review for HEP each lower extremity             PT Education - 06/10/17 1224    Education provided  Yes    Education Details  Patient and patient's niece were educated about examination findings and about patient having made minimal progress since the last re-assessment. Educated about continuing HEP and updated HEP. Also educated patient and niece about tips to prevent falls. Also educated patient about senior center and contacting clinic with any future questions or concerns.     Person(s) Educated  Patient;Other (comment) Niece    Methods  Explanation;Demonstration;Handout;Tactile cues;Verbal cues    Comprehension  Verbalized understanding;Returned demonstration       PT Short Term Goals - 06/10/17 1228      PT SHORT TERM GOAL #1   Title  Patient and patient's family will demonstrate understanding and report regular compliance with home exercise program to improve patient's balance, flexibility, and strength.     Baseline  06/10/17: Patient stated she has been doing some, but not all of her exercises and demonstrated some, but not all exercises.     Time  4    Period  Weeks    Status  Partially Met      PT SHORT TERM GOAL #2   Title  Patient will demonstrate improvement in MMT strength of 1/2 grade in all tested musculature deficient at evalution to assist patient with ambulation and other fucntional mobility.     Baseline  06/10/17: Patient improved in some, but not all muscle groups. See objective measures.     Time  4    Period  Weeks    Status  Partially Met      PT SHORT TERM GOAL #3   Title  Patient will demonstrate ability to maintain single limb stance for 3 seconds on each lower  extremity to assist with stair ambulation.     Baseline  06/10/17: Patient performed SLS on the right for 3 seconds and on the left for 1 second.     Time  4    Period  Weeks    Status  Partially Met        PT Long Term Goals - 06/10/17 1230      PT LONG TERM GOAL #1   Title  Patient will demonstrate improvement in MMT strength of 1 grade in all tested musculature deficient at evalution to assist patient with ambulation and other fucntional mobility.     Baseline  06/10/17: Patient improved strength by 1 MMT grade in some, but not all musculature. See objective measures.     Time  8    Period  Weeks    Status  Partially Met      PT LONG TERM GOAL #2   Title  Patient will demonstrate improvement in ability to perform 5 x sit to stand by 10 seconds indicating improvement in balance and functional mobility.     Baseline  06/10/17: Patient's 5 x sit to stand time improved by 3.62 seconds from  evaluation.     Time  8    Period  Weeks    Status  On-going      PT LONG TERM GOAL #3   Title  Patient will demonstate ability to ambulate an improvement of 100 feet on the 3 minute walk test demonstrating improved velocity and safety with ambulation.     Baseline  06/10/17: Patient demonstrated ability to ambulate continuously further than she had at evaluation, however she required a rest break and therefore did not ambulate as far as she had at the last re-assessment.     Time  8    Period  Weeks    Status  On-going            Plan - 06/10/17 1358    Clinical Impression Statement  This session performed a re-assessment of patient's progress with goals. Patient partially met 3 out of 3 of her short term goals. Patient partially met 1 of her long term goals. Patient demonstrated a decrease in the distance she was able to walk during the 3 MWT this session compared to the last re-assessment however demonstrated an improvement from the initial evaluation. Patient did demonstrate some increase in  strength of lower extremities, but some areas had not shown improvement from evaluation. Overall, patient demonstrated slight improvements or no improvement from last re-assessment. For the remainder of the session, therapist discussed examination findings with patient and her niece, therapist educated patient on continuing HEP and additional exercises, educated patient on tips to prevent falls at home, and also about the senior center. Patient reported that she has only been performing some of her home exercises indicating some non-compliance. Therapist reviewed exercises with the patient and reminded her that they would be beneficial in maintaining what progress the patient has made with therapy. Due to patient not having made significant progress since the last re-assessment, patient is being discharged from physical therapy at this time. Therapist educated patient that she could call the clinic with any questions or concerns in the future and that she should follow-up with her physician regarding her back pain and possible other interventions if needed. Patient and patient's niece acknowledged understanding.      Rehab Potential  Fair    Clinical Impairments Affecting Rehab Potential  Positive: support system, motivated; Negative: progressive nature of PD; comorbidities     PT Frequency  2x / week    PT Duration  8 weeks    PT Treatment/Interventions  ADLs/Self Care Home Management;Aquatic Therapy;Moist Heat;DME Instruction;Gait training;Stair training;Functional mobility training;Therapeutic activities;Therapeutic exercise;Balance training;Neuromuscular re-education;Patient/family education;Orthotic Fit/Training;Manual techniques;Cryotherapy;Passive range of motion;Dry needling;Energy conservation;Taping;Visual/perceptual remediation/compensation    PT Next Visit Plan  Patient discharged at this time    PT Home Exercise Plan  04/15/17: Supine HS stretch, bridges and supine SLR.  05/14/17: Sidelying  clamshells 1 x 15 each lower extremity. 06/10/17: SLS and stepping over hurdles 1x/day     Consulted and Agree with Plan of Care  Patient;Family member/caregiver    Family Member Consulted  Patient's niece       Patient will benefit from skilled therapeutic intervention in order to improve the following deficits and impairments:  Abnormal gait, Decreased balance, Decreased endurance, Decreased mobility, Difficulty walking, Improper body mechanics, Decreased activity tolerance, Decreased coordination, Decreased strength, Impaired flexibility, Postural dysfunction, Pain  Visit Diagnosis: Unsteadiness on feet  Other abnormalities of gait and mobility  Repeated falls  Other symptoms and signs involving the musculoskeletal system     Problem List Patient Active  Problem List   Diagnosis Date Noted  . Syncope 10/12/2014  . Diabetes ([place]) 10/12/2014  . Hypertension 10/12/2014  . OSTEOARTHRITIS, 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  . TRIGGER FINGER 04/27/2007   PHYSICAL THERAPY DISCHARGE SUMMARY  Visits from Start of Care: 17  Current functional level related to goals / functional outcomes: See above   Remaining deficits: See above   Education / Equipment: See above Plan: Patient agrees to discharge.  Patient goals were partially met. Patient is being discharged due to lack of progress.  ?????   Patient also agreed that she was pleased with her functional status and stated that she has not been feeling like she was going to fall as much at home.           Clarene Critchley PT, DPT 2:01 PM, 06/10/17 [place], [place], [address] Phone: [phone]   Fax:  [phone]  Name: [name] MRN: [id number] Date of Birth: 15-Jun-1940

## 2017-06-10 NOTE — Patient Instructions (Signed)
  SINGLE LEG STANCE - SLS Stand on one leg and maintain your balance. x 10 repetitions each lower extremity up to 1 minute on each. Stand at a counter for Balance. Repeat 10 Times Complete 1 Set Perform 1 Time(s) a Day   Lateral Hurdle Step Over Choose an appropriate height to step over and lift one leg high enough to clear the hurdle. Make sure when your first foot lands, that there is enough room for the other foot to plant next to it. Aim to keep the shoulders level as you move in order to challenge balance and coordination. Perform near a counter for support x 10 each leg Repeat 10 Times Complete 2 Sets Perform 1 Time(s) a Day

## 2017-07-19 DIAGNOSIS — K3184 Gastroparesis: Secondary | ICD-10-CM | POA: Diagnosis not present

## 2017-07-19 DIAGNOSIS — E1121 Type 2 diabetes mellitus with diabetic nephropathy: Secondary | ICD-10-CM | POA: Diagnosis not present

## 2017-07-19 DIAGNOSIS — G2111 Neuroleptic induced parkinsonism: Secondary | ICD-10-CM | POA: Diagnosis not present

## 2017-07-19 DIAGNOSIS — R2689 Other abnormalities of gait and mobility: Secondary | ICD-10-CM | POA: Diagnosis not present

## 2017-08-11 DIAGNOSIS — M130 Polyarthritis, unspecified: Secondary | ICD-10-CM | POA: Diagnosis not present

## 2017-08-11 DIAGNOSIS — N183 Chronic kidney disease, stage 3 (moderate): Secondary | ICD-10-CM | POA: Diagnosis not present

## 2017-08-11 DIAGNOSIS — I10 Essential (primary) hypertension: Secondary | ICD-10-CM | POA: Diagnosis not present

## 2017-08-11 DIAGNOSIS — E1165 Type 2 diabetes mellitus with hyperglycemia: Secondary | ICD-10-CM | POA: Diagnosis not present

## 2017-09-21 DIAGNOSIS — E1342 Other specified diabetes mellitus with diabetic polyneuropathy: Secondary | ICD-10-CM | POA: Diagnosis not present

## 2017-09-21 DIAGNOSIS — B351 Tinea unguium: Secondary | ICD-10-CM | POA: Diagnosis not present

## 2017-11-16 DIAGNOSIS — N183 Chronic kidney disease, stage 3 (moderate): Secondary | ICD-10-CM | POA: Diagnosis not present

## 2017-11-16 DIAGNOSIS — E1122 Type 2 diabetes mellitus with diabetic chronic kidney disease: Secondary | ICD-10-CM | POA: Diagnosis not present

## 2017-11-16 DIAGNOSIS — I10 Essential (primary) hypertension: Secondary | ICD-10-CM | POA: Diagnosis not present

## 2017-11-30 DIAGNOSIS — Z0000 Encounter for general adult medical examination without abnormal findings: Secondary | ICD-10-CM | POA: Diagnosis not present

## 2018-01-11 DIAGNOSIS — B351 Tinea unguium: Secondary | ICD-10-CM | POA: Diagnosis not present

## 2018-01-11 DIAGNOSIS — E1342 Other specified diabetes mellitus with diabetic polyneuropathy: Secondary | ICD-10-CM | POA: Diagnosis not present

## 2018-01-25 DIAGNOSIS — G2111 Neuroleptic induced parkinsonism: Secondary | ICD-10-CM | POA: Diagnosis not present

## 2018-01-25 DIAGNOSIS — R2689 Other abnormalities of gait and mobility: Secondary | ICD-10-CM | POA: Diagnosis not present

## 2018-01-25 DIAGNOSIS — E1121 Type 2 diabetes mellitus with diabetic nephropathy: Secondary | ICD-10-CM | POA: Diagnosis not present

## 2018-01-25 DIAGNOSIS — K3184 Gastroparesis: Secondary | ICD-10-CM | POA: Diagnosis not present

## 2018-02-15 DIAGNOSIS — E1165 Type 2 diabetes mellitus with hyperglycemia: Secondary | ICD-10-CM | POA: Diagnosis not present

## 2018-02-15 DIAGNOSIS — M419 Scoliosis, unspecified: Secondary | ICD-10-CM | POA: Diagnosis not present

## 2018-02-15 DIAGNOSIS — N390 Urinary tract infection, site not specified: Secondary | ICD-10-CM | POA: Diagnosis not present

## 2018-02-15 DIAGNOSIS — M130 Polyarthritis, unspecified: Secondary | ICD-10-CM | POA: Diagnosis not present

## 2018-02-15 DIAGNOSIS — I10 Essential (primary) hypertension: Secondary | ICD-10-CM | POA: Diagnosis not present

## 2018-03-22 DIAGNOSIS — B351 Tinea unguium: Secondary | ICD-10-CM | POA: Diagnosis not present

## 2018-03-22 DIAGNOSIS — E1342 Other specified diabetes mellitus with diabetic polyneuropathy: Secondary | ICD-10-CM | POA: Diagnosis not present

## 2018-05-31 DIAGNOSIS — E1342 Other specified diabetes mellitus with diabetic polyneuropathy: Secondary | ICD-10-CM | POA: Diagnosis not present

## 2018-05-31 DIAGNOSIS — B351 Tinea unguium: Secondary | ICD-10-CM | POA: Diagnosis not present

## 2018-06-17 ENCOUNTER — Other Ambulatory Visit: Payer: Medicare Other

## 2018-06-17 ENCOUNTER — Other Ambulatory Visit: Payer: Self-pay

## 2018-06-17 DIAGNOSIS — Z20822 Contact with and (suspected) exposure to covid-19: Secondary | ICD-10-CM

## 2018-06-17 DIAGNOSIS — R6889 Other general symptoms and signs: Secondary | ICD-10-CM | POA: Diagnosis not present

## 2018-06-23 ENCOUNTER — Telehealth: Payer: Self-pay | Admitting: Family Medicine

## 2018-06-23 LAB — NOVEL CORONAVIRUS, NAA: SARS-CoV-2, NAA: NOT DETECTED

## 2018-06-23 NOTE — Telephone Encounter (Signed)
° °  Covid results given to patient

## 2018-06-28 DIAGNOSIS — N390 Urinary tract infection, site not specified: Secondary | ICD-10-CM | POA: Diagnosis not present

## 2018-06-28 DIAGNOSIS — E1169 Type 2 diabetes mellitus with other specified complication: Secondary | ICD-10-CM | POA: Diagnosis not present

## 2018-06-28 DIAGNOSIS — N183 Chronic kidney disease, stage 3 (moderate): Secondary | ICD-10-CM | POA: Diagnosis not present

## 2018-06-28 DIAGNOSIS — I10 Essential (primary) hypertension: Secondary | ICD-10-CM | POA: Diagnosis not present

## 2018-06-28 DIAGNOSIS — M545 Low back pain: Secondary | ICD-10-CM | POA: Diagnosis not present

## 2018-06-28 DIAGNOSIS — E1122 Type 2 diabetes mellitus with diabetic chronic kidney disease: Secondary | ICD-10-CM | POA: Diagnosis not present

## 2018-07-20 DIAGNOSIS — K3184 Gastroparesis: Secondary | ICD-10-CM | POA: Diagnosis not present

## 2018-07-20 DIAGNOSIS — G2111 Neuroleptic induced parkinsonism: Secondary | ICD-10-CM | POA: Diagnosis not present

## 2018-07-20 DIAGNOSIS — E1121 Type 2 diabetes mellitus with diabetic nephropathy: Secondary | ICD-10-CM | POA: Diagnosis not present

## 2018-07-20 DIAGNOSIS — R2689 Other abnormalities of gait and mobility: Secondary | ICD-10-CM | POA: Diagnosis not present

## 2018-08-09 DIAGNOSIS — E1342 Other specified diabetes mellitus with diabetic polyneuropathy: Secondary | ICD-10-CM | POA: Diagnosis not present

## 2018-08-09 DIAGNOSIS — B351 Tinea unguium: Secondary | ICD-10-CM | POA: Diagnosis not present

## 2018-09-09 ENCOUNTER — Other Ambulatory Visit: Payer: Self-pay | Admitting: *Deleted

## 2018-09-09 DIAGNOSIS — Z20822 Contact with and (suspected) exposure to covid-19: Secondary | ICD-10-CM

## 2018-09-09 DIAGNOSIS — R6889 Other general symptoms and signs: Secondary | ICD-10-CM | POA: Diagnosis not present

## 2018-09-10 LAB — NOVEL CORONAVIRUS, NAA: SARS-CoV-2, NAA: NOT DETECTED

## 2018-09-27 DIAGNOSIS — N183 Chronic kidney disease, stage 3 (moderate): Secondary | ICD-10-CM | POA: Diagnosis not present

## 2018-09-27 DIAGNOSIS — E1169 Type 2 diabetes mellitus with other specified complication: Secondary | ICD-10-CM | POA: Diagnosis not present

## 2018-09-27 DIAGNOSIS — Z0000 Encounter for general adult medical examination without abnormal findings: Secondary | ICD-10-CM | POA: Diagnosis not present

## 2018-09-27 DIAGNOSIS — I10 Essential (primary) hypertension: Secondary | ICD-10-CM | POA: Diagnosis not present

## 2018-10-18 DIAGNOSIS — E1342 Other specified diabetes mellitus with diabetic polyneuropathy: Secondary | ICD-10-CM | POA: Diagnosis not present

## 2018-10-18 DIAGNOSIS — B351 Tinea unguium: Secondary | ICD-10-CM | POA: Diagnosis not present

## 2018-12-28 ENCOUNTER — Other Ambulatory Visit: Payer: Self-pay

## 2018-12-28 ENCOUNTER — Ambulatory Visit: Payer: Medicare Other | Attending: Internal Medicine

## 2018-12-28 DIAGNOSIS — Z20822 Contact with and (suspected) exposure to covid-19: Secondary | ICD-10-CM

## 2018-12-29 LAB — NOVEL CORONAVIRUS, NAA: SARS-CoV-2, NAA: NOT DETECTED

## 2018-12-31 ENCOUNTER — Telehealth: Payer: Self-pay | Admitting: Family Medicine

## 2018-12-31 NOTE — Telephone Encounter (Signed)
Patient called to receive her negative COVID test results.Patient expressed understanding.

## 2019-01-03 DIAGNOSIS — B351 Tinea unguium: Secondary | ICD-10-CM | POA: Diagnosis not present

## 2019-01-03 DIAGNOSIS — E1342 Other specified diabetes mellitus with diabetic polyneuropathy: Secondary | ICD-10-CM | POA: Diagnosis not present

## 2019-01-16 DIAGNOSIS — K3184 Gastroparesis: Secondary | ICD-10-CM | POA: Diagnosis not present

## 2019-01-16 DIAGNOSIS — E1121 Type 2 diabetes mellitus with diabetic nephropathy: Secondary | ICD-10-CM | POA: Diagnosis not present

## 2019-01-16 DIAGNOSIS — G2111 Neuroleptic induced parkinsonism: Secondary | ICD-10-CM | POA: Diagnosis not present

## 2019-01-16 DIAGNOSIS — R2689 Other abnormalities of gait and mobility: Secondary | ICD-10-CM | POA: Diagnosis not present

## 2019-02-20 DIAGNOSIS — Z23 Encounter for immunization: Secondary | ICD-10-CM | POA: Diagnosis not present

## 2019-03-14 DIAGNOSIS — E1342 Other specified diabetes mellitus with diabetic polyneuropathy: Secondary | ICD-10-CM | POA: Diagnosis not present

## 2019-03-14 DIAGNOSIS — B351 Tinea unguium: Secondary | ICD-10-CM | POA: Diagnosis not present

## 2019-03-16 ENCOUNTER — Other Ambulatory Visit: Payer: Self-pay

## 2019-03-16 NOTE — Patient Outreach (Signed)
[facility]) Care Management  03/16/2019  [name] 1940-02-29 [id number]   Medication Adherence call to [name] [facility] spoke with patients daughter she is past due on Simvastatin 5 mg,daughter explain they receive her medications by mail order and has received the month of march,patients daughter explain they are up to date with her mothers prescriptions. [name] is showing past due under [place].   [facility] Management Direct Dial [phone]  Fax [phone] [email or web address]

## 2019-03-28 DIAGNOSIS — M4135 Thoracogenic scoliosis, thoracolumbar region: Secondary | ICD-10-CM | POA: Diagnosis not present

## 2019-03-28 DIAGNOSIS — N183 Chronic kidney disease, stage 3 unspecified: Secondary | ICD-10-CM | POA: Diagnosis not present

## 2019-03-28 DIAGNOSIS — Z794 Long term (current) use of insulin: Secondary | ICD-10-CM | POA: Diagnosis not present

## 2019-03-28 DIAGNOSIS — E1165 Type 2 diabetes mellitus with hyperglycemia: Secondary | ICD-10-CM | POA: Diagnosis not present

## 2019-03-28 DIAGNOSIS — I10 Essential (primary) hypertension: Secondary | ICD-10-CM | POA: Diagnosis not present

## 2019-03-28 DIAGNOSIS — Z7189 Other specified counseling: Secondary | ICD-10-CM | POA: Diagnosis not present

## 2019-04-05 DIAGNOSIS — M130 Polyarthritis, unspecified: Secondary | ICD-10-CM | POA: Diagnosis not present

## 2019-04-05 DIAGNOSIS — E11649 Type 2 diabetes mellitus with hypoglycemia without coma: Secondary | ICD-10-CM | POA: Diagnosis not present

## 2019-04-05 DIAGNOSIS — N183 Chronic kidney disease, stage 3 unspecified: Secondary | ICD-10-CM | POA: Diagnosis not present

## 2019-04-05 DIAGNOSIS — I129 Hypertensive chronic kidney disease with stage 1 through stage 4 chronic kidney disease, or unspecified chronic kidney disease: Secondary | ICD-10-CM | POA: Diagnosis not present

## 2019-04-14 ENCOUNTER — Other Ambulatory Visit: Payer: Medicare Other

## 2019-05-23 DIAGNOSIS — E1342 Other specified diabetes mellitus with diabetic polyneuropathy: Secondary | ICD-10-CM | POA: Diagnosis not present

## 2019-05-23 DIAGNOSIS — B351 Tinea unguium: Secondary | ICD-10-CM | POA: Diagnosis not present

## 2019-06-19 DIAGNOSIS — N183 Chronic kidney disease, stage 3 unspecified: Secondary | ICD-10-CM | POA: Diagnosis not present

## 2019-06-19 DIAGNOSIS — M130 Polyarthritis, unspecified: Secondary | ICD-10-CM | POA: Diagnosis not present

## 2019-06-19 DIAGNOSIS — R1310 Dysphagia, unspecified: Secondary | ICD-10-CM | POA: Diagnosis not present

## 2019-06-19 DIAGNOSIS — E1169 Type 2 diabetes mellitus with other specified complication: Secondary | ICD-10-CM | POA: Diagnosis not present

## 2019-06-19 DIAGNOSIS — I10 Essential (primary) hypertension: Secondary | ICD-10-CM | POA: Diagnosis not present

## 2019-06-19 DIAGNOSIS — M159 Polyosteoarthritis, unspecified: Secondary | ICD-10-CM | POA: Diagnosis not present

## 2019-06-28 ENCOUNTER — Other Ambulatory Visit: Payer: Self-pay | Admitting: Family Medicine

## 2019-06-28 ENCOUNTER — Other Ambulatory Visit (HOSPITAL_COMMUNITY): Payer: Self-pay | Admitting: Family Medicine

## 2019-06-28 DIAGNOSIS — R1310 Dysphagia, unspecified: Secondary | ICD-10-CM

## 2019-07-03 ENCOUNTER — Ambulatory Visit (HOSPITAL_COMMUNITY)
Admission: RE | Admit: 2019-07-03 | Discharge: 2019-07-03 | Disposition: A | Discharge status: Home / Self Care | Payer: Medicare Other | Source: Ambulatory Visit | Attending: Family Medicine | Admitting: Family Medicine

## 2019-07-03 ENCOUNTER — Other Ambulatory Visit: Payer: Self-pay

## 2019-07-03 DIAGNOSIS — R1310 Dysphagia, unspecified: Secondary | ICD-10-CM | POA: Diagnosis not present

## 2019-07-03 DIAGNOSIS — K219 Gastro-esophageal reflux disease without esophagitis: Secondary | ICD-10-CM | POA: Diagnosis not present

## 2019-07-10 DIAGNOSIS — K449 Diaphragmatic hernia without obstruction or gangrene: Secondary | ICD-10-CM | POA: Diagnosis not present

## 2019-07-10 DIAGNOSIS — E1122 Type 2 diabetes mellitus with diabetic chronic kidney disease: Secondary | ICD-10-CM | POA: Diagnosis not present

## 2019-07-10 DIAGNOSIS — I10 Essential (primary) hypertension: Secondary | ICD-10-CM | POA: Diagnosis not present

## 2019-07-10 DIAGNOSIS — M545 Low back pain: Secondary | ICD-10-CM | POA: Diagnosis not present

## 2019-07-10 DIAGNOSIS — N183 Chronic kidney disease, stage 3 unspecified: Secondary | ICD-10-CM | POA: Diagnosis not present

## 2019-07-17 DIAGNOSIS — K3184 Gastroparesis: Secondary | ICD-10-CM | POA: Diagnosis not present

## 2019-07-17 DIAGNOSIS — G2111 Neuroleptic induced parkinsonism: Secondary | ICD-10-CM | POA: Diagnosis not present

## 2019-07-17 DIAGNOSIS — R2689 Other abnormalities of gait and mobility: Secondary | ICD-10-CM | POA: Diagnosis not present

## 2019-07-17 DIAGNOSIS — Z79899 Other long term (current) drug therapy: Secondary | ICD-10-CM | POA: Diagnosis not present

## 2019-07-17 DIAGNOSIS — K219 Gastro-esophageal reflux disease without esophagitis: Secondary | ICD-10-CM | POA: Diagnosis not present

## 2019-08-15 DIAGNOSIS — E1342 Other specified diabetes mellitus with diabetic polyneuropathy: Secondary | ICD-10-CM | POA: Diagnosis not present

## 2019-08-15 DIAGNOSIS — B351 Tinea unguium: Secondary | ICD-10-CM | POA: Diagnosis not present

## 2019-08-17 DIAGNOSIS — M130 Polyarthritis, unspecified: Secondary | ICD-10-CM | POA: Diagnosis not present

## 2019-08-17 DIAGNOSIS — Z0000 Encounter for general adult medical examination without abnormal findings: Secondary | ICD-10-CM | POA: Diagnosis not present

## 2019-08-17 DIAGNOSIS — R609 Edema, unspecified: Secondary | ICD-10-CM | POA: Diagnosis not present

## 2019-08-17 DIAGNOSIS — N183 Chronic kidney disease, stage 3 unspecified: Secondary | ICD-10-CM | POA: Diagnosis not present

## 2019-08-29 DIAGNOSIS — R600 Localized edema: Secondary | ICD-10-CM | POA: Diagnosis not present

## 2019-08-29 DIAGNOSIS — Z7189 Other specified counseling: Secondary | ICD-10-CM | POA: Diagnosis not present

## 2019-08-29 DIAGNOSIS — I10 Essential (primary) hypertension: Secondary | ICD-10-CM | POA: Diagnosis not present

## 2019-08-29 DIAGNOSIS — I129 Hypertensive chronic kidney disease with stage 1 through stage 4 chronic kidney disease, or unspecified chronic kidney disease: Secondary | ICD-10-CM | POA: Diagnosis not present

## 2019-09-05 DIAGNOSIS — E1122 Type 2 diabetes mellitus with diabetic chronic kidney disease: Secondary | ICD-10-CM | POA: Diagnosis not present

## 2019-09-05 DIAGNOSIS — N183 Chronic kidney disease, stage 3 unspecified: Secondary | ICD-10-CM | POA: Diagnosis not present

## 2019-09-05 DIAGNOSIS — I129 Hypertensive chronic kidney disease with stage 1 through stage 4 chronic kidney disease, or unspecified chronic kidney disease: Secondary | ICD-10-CM | POA: Diagnosis not present

## 2019-09-05 DIAGNOSIS — M130 Polyarthritis, unspecified: Secondary | ICD-10-CM | POA: Diagnosis not present

## 2019-09-05 DIAGNOSIS — E11649 Type 2 diabetes mellitus with hypoglycemia without coma: Secondary | ICD-10-CM | POA: Diagnosis not present

## 2019-10-05 DIAGNOSIS — E1122 Type 2 diabetes mellitus with diabetic chronic kidney disease: Secondary | ICD-10-CM | POA: Diagnosis not present

## 2019-10-05 DIAGNOSIS — I129 Hypertensive chronic kidney disease with stage 1 through stage 4 chronic kidney disease, or unspecified chronic kidney disease: Secondary | ICD-10-CM | POA: Diagnosis not present

## 2019-10-05 DIAGNOSIS — N183 Chronic kidney disease, stage 3 unspecified: Secondary | ICD-10-CM | POA: Diagnosis not present

## 2019-10-05 DIAGNOSIS — M130 Polyarthritis, unspecified: Secondary | ICD-10-CM | POA: Diagnosis not present

## 2019-10-10 DIAGNOSIS — Z7901 Long term (current) use of anticoagulants: Secondary | ICD-10-CM | POA: Diagnosis not present

## 2019-10-10 DIAGNOSIS — N183 Chronic kidney disease, stage 3 unspecified: Secondary | ICD-10-CM | POA: Diagnosis not present

## 2019-10-10 DIAGNOSIS — I10 Essential (primary) hypertension: Secondary | ICD-10-CM | POA: Diagnosis not present

## 2019-10-10 DIAGNOSIS — Z23 Encounter for immunization: Secondary | ICD-10-CM | POA: Diagnosis not present

## 2019-10-10 DIAGNOSIS — E1169 Type 2 diabetes mellitus with other specified complication: Secondary | ICD-10-CM | POA: Diagnosis not present

## 2019-10-10 DIAGNOSIS — E782 Mixed hyperlipidemia: Secondary | ICD-10-CM | POA: Diagnosis not present

## 2019-10-10 DIAGNOSIS — E1122 Type 2 diabetes mellitus with diabetic chronic kidney disease: Secondary | ICD-10-CM | POA: Diagnosis not present

## 2019-10-24 DIAGNOSIS — E1342 Other specified diabetes mellitus with diabetic polyneuropathy: Secondary | ICD-10-CM | POA: Diagnosis not present

## 2019-10-24 DIAGNOSIS — B351 Tinea unguium: Secondary | ICD-10-CM | POA: Diagnosis not present

## 2019-12-05 DIAGNOSIS — M130 Polyarthritis, unspecified: Secondary | ICD-10-CM | POA: Diagnosis not present

## 2019-12-05 DIAGNOSIS — I129 Hypertensive chronic kidney disease with stage 1 through stage 4 chronic kidney disease, or unspecified chronic kidney disease: Secondary | ICD-10-CM | POA: Diagnosis not present

## 2019-12-05 DIAGNOSIS — E11649 Type 2 diabetes mellitus with hypoglycemia without coma: Secondary | ICD-10-CM | POA: Diagnosis not present

## 2019-12-05 DIAGNOSIS — N183 Chronic kidney disease, stage 3 unspecified: Secondary | ICD-10-CM | POA: Diagnosis not present

## 2019-12-11 ENCOUNTER — Other Ambulatory Visit: Payer: Medicare Other

## 2019-12-11 ENCOUNTER — Other Ambulatory Visit: Payer: Self-pay

## 2019-12-11 DIAGNOSIS — Z20822 Contact with and (suspected) exposure to covid-19: Secondary | ICD-10-CM | POA: Diagnosis not present

## 2019-12-12 LAB — SARS-COV-2, NAA 2 DAY TAT

## 2019-12-12 LAB — NOVEL CORONAVIRUS, NAA: SARS-CoV-2, NAA: NOT DETECTED

## 2020-01-02 DIAGNOSIS — E1342 Other specified diabetes mellitus with diabetic polyneuropathy: Secondary | ICD-10-CM | POA: Diagnosis not present

## 2020-01-02 DIAGNOSIS — B351 Tinea unguium: Secondary | ICD-10-CM | POA: Diagnosis not present

## 2020-01-16 DIAGNOSIS — Z794 Long term (current) use of insulin: Secondary | ICD-10-CM | POA: Diagnosis not present

## 2020-01-16 DIAGNOSIS — N183 Chronic kidney disease, stage 3 unspecified: Secondary | ICD-10-CM | POA: Diagnosis not present

## 2020-01-16 DIAGNOSIS — E1122 Type 2 diabetes mellitus with diabetic chronic kidney disease: Secondary | ICD-10-CM | POA: Diagnosis not present

## 2020-01-16 DIAGNOSIS — M159 Polyosteoarthritis, unspecified: Secondary | ICD-10-CM | POA: Diagnosis not present

## 2020-01-16 DIAGNOSIS — I10 Essential (primary) hypertension: Secondary | ICD-10-CM | POA: Diagnosis not present

## 2020-02-03 DIAGNOSIS — M130 Polyarthritis, unspecified: Secondary | ICD-10-CM | POA: Diagnosis not present

## 2020-02-03 DIAGNOSIS — E11649 Type 2 diabetes mellitus with hypoglycemia without coma: Secondary | ICD-10-CM | POA: Diagnosis not present

## 2020-02-03 DIAGNOSIS — N183 Chronic kidney disease, stage 3 unspecified: Secondary | ICD-10-CM | POA: Diagnosis not present

## 2020-02-03 DIAGNOSIS — I129 Hypertensive chronic kidney disease with stage 1 through stage 4 chronic kidney disease, or unspecified chronic kidney disease: Secondary | ICD-10-CM | POA: Diagnosis not present

## 2020-04-14 DIAGNOSIS — E162 Hypoglycemia, unspecified: Secondary | ICD-10-CM | POA: Diagnosis not present

## 2020-04-14 DIAGNOSIS — R61 Generalized hyperhidrosis: Secondary | ICD-10-CM | POA: Diagnosis not present

## 2020-04-14 DIAGNOSIS — R410 Disorientation, unspecified: Secondary | ICD-10-CM | POA: Diagnosis not present

## 2020-04-14 DIAGNOSIS — E161 Other hypoglycemia: Secondary | ICD-10-CM | POA: Diagnosis not present

## 2020-04-16 DIAGNOSIS — N1832 Chronic kidney disease, stage 3b: Secondary | ICD-10-CM | POA: Diagnosis not present

## 2020-04-16 DIAGNOSIS — I10 Essential (primary) hypertension: Secondary | ICD-10-CM | POA: Diagnosis not present

## 2020-04-16 DIAGNOSIS — E0822 Diabetes mellitus due to underlying condition with diabetic chronic kidney disease: Secondary | ICD-10-CM | POA: Diagnosis not present

## 2020-04-16 DIAGNOSIS — E1122 Type 2 diabetes mellitus with diabetic chronic kidney disease: Secondary | ICD-10-CM | POA: Diagnosis not present

## 2020-04-16 DIAGNOSIS — M4135 Thoracogenic scoliosis, thoracolumbar region: Secondary | ICD-10-CM | POA: Diagnosis not present

## 2020-04-16 DIAGNOSIS — E11649 Type 2 diabetes mellitus with hypoglycemia without coma: Secondary | ICD-10-CM | POA: Diagnosis not present

## 2020-04-16 DIAGNOSIS — E1169 Type 2 diabetes mellitus with other specified complication: Secondary | ICD-10-CM | POA: Diagnosis not present

## 2020-04-16 DIAGNOSIS — N183 Chronic kidney disease, stage 3 unspecified: Secondary | ICD-10-CM | POA: Diagnosis not present

## 2020-04-16 DIAGNOSIS — M150 Primary generalized (osteo)arthritis: Secondary | ICD-10-CM | POA: Diagnosis not present

## 2020-04-16 DIAGNOSIS — M159 Polyosteoarthritis, unspecified: Secondary | ICD-10-CM | POA: Diagnosis not present

## 2020-05-04 DIAGNOSIS — I129 Hypertensive chronic kidney disease with stage 1 through stage 4 chronic kidney disease, or unspecified chronic kidney disease: Secondary | ICD-10-CM | POA: Diagnosis not present

## 2020-05-04 DIAGNOSIS — N183 Chronic kidney disease, stage 3 unspecified: Secondary | ICD-10-CM | POA: Diagnosis not present

## 2020-05-04 DIAGNOSIS — M130 Polyarthritis, unspecified: Secondary | ICD-10-CM | POA: Diagnosis not present

## 2020-05-04 DIAGNOSIS — E11649 Type 2 diabetes mellitus with hypoglycemia without coma: Secondary | ICD-10-CM | POA: Diagnosis not present

## 2020-05-20 DIAGNOSIS — I10 Essential (primary) hypertension: Secondary | ICD-10-CM | POA: Diagnosis not present

## 2020-05-20 DIAGNOSIS — N183 Chronic kidney disease, stage 3 unspecified: Secondary | ICD-10-CM | POA: Diagnosis not present

## 2020-05-20 DIAGNOSIS — M1830 Unilateral post-traumatic osteoarthritis of first carpometacarpal joint, unspecified hand: Secondary | ICD-10-CM | POA: Diagnosis not present

## 2020-05-20 DIAGNOSIS — E1122 Type 2 diabetes mellitus with diabetic chronic kidney disease: Secondary | ICD-10-CM | POA: Diagnosis not present

## 2020-05-20 DIAGNOSIS — M150 Primary generalized (osteo)arthritis: Secondary | ICD-10-CM | POA: Diagnosis not present

## 2020-05-20 DIAGNOSIS — E1169 Type 2 diabetes mellitus with other specified complication: Secondary | ICD-10-CM | POA: Diagnosis not present

## 2020-05-24 DIAGNOSIS — T43505A Adverse effect of unspecified antipsychotics and neuroleptics, initial encounter: Secondary | ICD-10-CM | POA: Insufficient documentation

## 2020-05-30 DIAGNOSIS — Z7189 Other specified counseling: Secondary | ICD-10-CM | POA: Diagnosis not present

## 2020-05-30 DIAGNOSIS — I129 Hypertensive chronic kidney disease with stage 1 through stage 4 chronic kidney disease, or unspecified chronic kidney disease: Secondary | ICD-10-CM | POA: Diagnosis not present

## 2020-05-30 DIAGNOSIS — I5032 Chronic diastolic (congestive) heart failure: Secondary | ICD-10-CM | POA: Diagnosis not present

## 2020-05-30 DIAGNOSIS — E1129 Type 2 diabetes mellitus with other diabetic kidney complication: Secondary | ICD-10-CM | POA: Diagnosis not present

## 2020-05-30 DIAGNOSIS — I5033 Acute on chronic diastolic (congestive) heart failure: Secondary | ICD-10-CM | POA: Diagnosis not present

## 2020-05-30 DIAGNOSIS — N189 Chronic kidney disease, unspecified: Secondary | ICD-10-CM | POA: Diagnosis not present

## 2020-05-30 DIAGNOSIS — R809 Proteinuria, unspecified: Secondary | ICD-10-CM | POA: Diagnosis not present

## 2020-05-30 DIAGNOSIS — E1122 Type 2 diabetes mellitus with diabetic chronic kidney disease: Secondary | ICD-10-CM | POA: Diagnosis not present

## 2020-05-30 DIAGNOSIS — E871 Hypo-osmolality and hyponatremia: Secondary | ICD-10-CM | POA: Diagnosis not present

## 2020-05-30 DIAGNOSIS — Z79899 Other long term (current) drug therapy: Secondary | ICD-10-CM | POA: Diagnosis not present

## 2020-06-04 DIAGNOSIS — M130 Polyarthritis, unspecified: Secondary | ICD-10-CM | POA: Diagnosis not present

## 2020-06-04 DIAGNOSIS — N183 Chronic kidney disease, stage 3 unspecified: Secondary | ICD-10-CM | POA: Diagnosis not present

## 2020-06-04 DIAGNOSIS — I129 Hypertensive chronic kidney disease with stage 1 through stage 4 chronic kidney disease, or unspecified chronic kidney disease: Secondary | ICD-10-CM | POA: Diagnosis not present

## 2020-06-04 DIAGNOSIS — E11649 Type 2 diabetes mellitus with hypoglycemia without coma: Secondary | ICD-10-CM | POA: Diagnosis not present

## 2020-06-13 ENCOUNTER — Encounter: Payer: Self-pay | Admitting: *Deleted

## 2020-06-13 DIAGNOSIS — E1129 Type 2 diabetes mellitus with other diabetic kidney complication: Secondary | ICD-10-CM | POA: Diagnosis not present

## 2020-06-13 DIAGNOSIS — Z79899 Other long term (current) drug therapy: Secondary | ICD-10-CM | POA: Diagnosis not present

## 2020-06-13 DIAGNOSIS — R809 Proteinuria, unspecified: Secondary | ICD-10-CM | POA: Diagnosis not present

## 2020-06-13 DIAGNOSIS — N189 Chronic kidney disease, unspecified: Secondary | ICD-10-CM | POA: Diagnosis not present

## 2020-06-13 DIAGNOSIS — E1122 Type 2 diabetes mellitus with diabetic chronic kidney disease: Secondary | ICD-10-CM | POA: Diagnosis not present

## 2020-06-13 DIAGNOSIS — E559 Vitamin D deficiency, unspecified: Secondary | ICD-10-CM | POA: Diagnosis not present

## 2020-06-13 DIAGNOSIS — E871 Hypo-osmolality and hyponatremia: Secondary | ICD-10-CM | POA: Diagnosis not present

## 2020-06-25 DIAGNOSIS — M79671 Pain in right foot: Secondary | ICD-10-CM | POA: Diagnosis not present

## 2020-06-25 DIAGNOSIS — E1140 Type 2 diabetes mellitus with diabetic neuropathy, unspecified: Secondary | ICD-10-CM | POA: Diagnosis not present

## 2020-06-25 DIAGNOSIS — M79675 Pain in left toe(s): Secondary | ICD-10-CM | POA: Diagnosis not present

## 2020-06-25 DIAGNOSIS — I739 Peripheral vascular disease, unspecified: Secondary | ICD-10-CM | POA: Diagnosis not present

## 2020-06-25 DIAGNOSIS — M79672 Pain in left foot: Secondary | ICD-10-CM | POA: Diagnosis not present

## 2020-06-25 DIAGNOSIS — M79674 Pain in right toe(s): Secondary | ICD-10-CM | POA: Diagnosis not present

## 2020-07-03 DIAGNOSIS — K219 Gastro-esophageal reflux disease without esophagitis: Secondary | ICD-10-CM | POA: Diagnosis not present

## 2020-07-03 DIAGNOSIS — R2689 Other abnormalities of gait and mobility: Secondary | ICD-10-CM | POA: Diagnosis not present

## 2020-07-03 DIAGNOSIS — Z79899 Other long term (current) drug therapy: Secondary | ICD-10-CM | POA: Diagnosis not present

## 2020-07-03 DIAGNOSIS — E1143 Type 2 diabetes mellitus with diabetic autonomic (poly)neuropathy: Secondary | ICD-10-CM | POA: Diagnosis not present

## 2020-07-03 DIAGNOSIS — G2111 Neuroleptic induced parkinsonism: Secondary | ICD-10-CM | POA: Diagnosis not present

## 2020-07-03 DIAGNOSIS — K3184 Gastroparesis: Secondary | ICD-10-CM | POA: Diagnosis not present

## 2020-07-04 DIAGNOSIS — I129 Hypertensive chronic kidney disease with stage 1 through stage 4 chronic kidney disease, or unspecified chronic kidney disease: Secondary | ICD-10-CM | POA: Diagnosis not present

## 2020-07-04 DIAGNOSIS — N183 Chronic kidney disease, stage 3 unspecified: Secondary | ICD-10-CM | POA: Diagnosis not present

## 2020-07-04 DIAGNOSIS — E11649 Type 2 diabetes mellitus with hypoglycemia without coma: Secondary | ICD-10-CM | POA: Diagnosis not present

## 2020-07-04 DIAGNOSIS — M130 Polyarthritis, unspecified: Secondary | ICD-10-CM | POA: Diagnosis not present

## 2020-07-05 DIAGNOSIS — E1129 Type 2 diabetes mellitus with other diabetic kidney complication: Secondary | ICD-10-CM | POA: Diagnosis not present

## 2020-07-05 DIAGNOSIS — E559 Vitamin D deficiency, unspecified: Secondary | ICD-10-CM | POA: Diagnosis not present

## 2020-07-05 DIAGNOSIS — N189 Chronic kidney disease, unspecified: Secondary | ICD-10-CM | POA: Diagnosis not present

## 2020-07-05 DIAGNOSIS — I129 Hypertensive chronic kidney disease with stage 1 through stage 4 chronic kidney disease, or unspecified chronic kidney disease: Secondary | ICD-10-CM | POA: Diagnosis not present

## 2020-07-05 DIAGNOSIS — R809 Proteinuria, unspecified: Secondary | ICD-10-CM | POA: Diagnosis not present

## 2020-07-05 DIAGNOSIS — E1122 Type 2 diabetes mellitus with diabetic chronic kidney disease: Secondary | ICD-10-CM | POA: Diagnosis not present

## 2020-07-05 DIAGNOSIS — E211 Secondary hyperparathyroidism, not elsewhere classified: Secondary | ICD-10-CM | POA: Diagnosis not present

## 2020-07-05 DIAGNOSIS — I5032 Chronic diastolic (congestive) heart failure: Secondary | ICD-10-CM | POA: Diagnosis not present

## 2020-07-05 DIAGNOSIS — R768 Other specified abnormal immunological findings in serum: Secondary | ICD-10-CM | POA: Diagnosis not present

## 2020-07-17 ENCOUNTER — Other Ambulatory Visit: Payer: Self-pay

## 2020-07-17 ENCOUNTER — Ambulatory Visit (HOSPITAL_COMMUNITY): Payer: Medicare Other | Attending: Neurology

## 2020-07-17 ENCOUNTER — Encounter (HOSPITAL_COMMUNITY): Payer: Self-pay

## 2020-07-17 DIAGNOSIS — M545 Low back pain, unspecified: Secondary | ICD-10-CM | POA: Diagnosis not present

## 2020-07-17 DIAGNOSIS — G8929 Other chronic pain: Secondary | ICD-10-CM | POA: Insufficient documentation

## 2020-07-17 DIAGNOSIS — M6281 Muscle weakness (generalized): Secondary | ICD-10-CM | POA: Diagnosis not present

## 2020-07-17 DIAGNOSIS — R2689 Other abnormalities of gait and mobility: Secondary | ICD-10-CM | POA: Diagnosis not present

## 2020-07-17 NOTE — Therapy (Signed)
[facility] [place], [place], [address] Phone: [phone]   Fax:  [phone]  Physical Therapy Evaluation  Patient Details  Name: [name] MRN: [id number] Date of Birth: 1940-02-22 Referring Provider (PT): Phillips Odor, MD   Encounter Date: 07/17/2020   PT End of Session - 07/17/20 1402     Visit Number 1    Number of Visits 12    Date for PT Re-Evaluation 08/28/20    Authorization Type UHC Medicare, no VL, no auth    PT Start Time 1345    PT Stop Time 1430    PT Time Calculation (min) 45 min    Equipment Utilized During Treatment Gait belt    Activity Tolerance Patient limited by fatigue;Patient limited by pain    Behavior During Therapy WFL for tasks assessed/performed             Past Medical History:  Diagnosis Date   Diabetes mellitus    GERD (gastroesophageal reflux disease)    Hypertension    Parkinson disease ([place])    Scoliosis    Scoliosis    UTI (lower urinary tract infection)     Past Surgical History:  Procedure Laterality Date   boil Left    boil removed from left cheek   EYE SURGERY     Retina Detachment   MASS EXCISION N/A 06/27/2014   Procedure: EXCISION SUBMANDIBULAR STONE;  Surgeon: Izora Gala, MD;  Location: [facility];  Service: ENT;  Laterality: N/A;   sublingual gland removal  2016   tumor removed from rib      There were no vitals filed for this visit.    Subjective Assessment - 07/17/20 1437     Subjective Pt endorses increased weakness and increase in LBP which is chronic in nature and pt relates to presence of scoliosis and reports decrease in ambulation and activity tolerance    Currently in Pain? Yes    Pain Score 8     Pain Location Back    Pain Orientation Lower    Pain Descriptors / Indicators Aching    Pain Type Chronic pain                [facility] PT Assessment - 07/17/20 0001       Assessment   Medical Diagnosis abnormalities of gait and mobility     Referring Provider (PT) Phillips Odor, MD      Balance Screen   Has the patient fallen in the past 6 months Yes    How many times? 1    Has the patient had a decrease in activity level because of a fear of falling?  No    Is the patient reluctant to leave their home because of a fear of falling?  Yes      [place] Private residence    Living Arrangements Children    Available Help at Discharge Family    Type of [place] to enter    Entrance Stairs-Number of Steps 2    Entrance Stairs-Rails Can reach both    [place] One level    [place] - 4 wheels;Cane - single point;Bedside commode      Prior Function   Level of Independence Independent with basic ADLs;Independent with household mobility with device    Vocation Retired    Leisure TV      Observation/Other Assessments   Scoliosis  dextroscoliosis?      Observation/Other Assessments-Edema    Edema --   bilateral ankles     Coordination   Gross Motor Movements are Fluid and Coordinated Yes      Posture/Postural Control   Posture/Postural Control Postural limitations    Postural Limitations Flexed trunk      ROM / Strength   AROM / PROM / Strength Strength      Strength   Strength Assessment Site Hip;Knee;Ankle    Right/Left Hip Right;Left    Right Hip Flexion 3+/5    Right Hip Extension 2+/5    Right Hip ABduction 3/5    Right Hip ADduction 3+/5    Left Hip Flexion 3+/5    Left Hip Extension 2+/5    Left Hip ABduction 3/5    Left Hip ADduction 3+/5    Right/Left Knee Right;Left    Right Knee Flexion 4/5    Right Knee Extension 4/5    Left Knee Flexion 4/5    Left Knee Extension 4/5    Right/Left Ankle Right;Left    Right Ankle Dorsiflexion 4/5    Left Ankle Dorsiflexion 3+/5      Palpation   Spinal mobility scoliosis evident with right hip higher than left      Transfers   Transfers Sit to Stand;Stand Pivot Transfers    Sit to Stand 6:  Modified independent (Device/Increase time)    Five time sit to stand comments  25.7 sec with BUE assist      Ambulation/Gait   Ambulation/Gait Yes    Ambulation/Gait Assistance 5: Supervision    Ambulation Distance (Feet) 42 Feet    Assistive device Straight cane    Gait Pattern Step-to pattern    Ambulation Surface Level;Indoor    Stairs Yes    Stairs Assistance 4: Min guard    Stair Management Technique Two rails;Step to pattern                        Objective measurements completed on examination: See above findings.               PT Education - 07/17/20 1438     Education Details pt and her daughter educated on assessment findings and benefits of Physical Therapy services to improve mobility, activity tolerance, and analyze for necessary compensation/adaptations    Person(s) Educated Patient;Child(ren)    Methods Explanation    Comprehension Verbalized understanding              PT Short Term Goals - 07/17/20 1508       PT SHORT TERM GOAL #1   Title Patient will be independent with HEP in order to improve functional outcomes.    Time 3    Period Weeks    Status New    Target Date 08/07/20      PT SHORT TERM GOAL #2   Title Patient will report at least 25% improvement in symptoms for improved quality of life.    Baseline 8/10 back pain    Time 3    Period Weeks    Status New    Target Date 08/07/20      PT SHORT TERM GOAL #3   Title Demo improved BLE strength and balance as evidenced by 20 sec 5xSTS without use of BUE    Baseline 25.7 sec with BUE support required    Time 3    Period Weeks    Status New    Target  Date 08/07/20               PT Long Term Goals - 07/17/20 1512       PT LONG TERM GOAL #1   Title Patient will report at least 50% improvement in symptoms for improved quality of life.    Baseline 8/10 back pain    Time 6    Period Weeks    Status New    Target Date 08/28/20      PT LONG TERM GOAL #2    Title Demo improved BLE function and balance as evidenced by 5xSTS in 15 sec    Baseline 25.7 sec BUE assist    Time 6    Period Weeks    Status New    Target Date 08/28/20      PT LONG TERM GOAL #3   Title Demo improved gait velocity and tolerance as evidenced by distance of 100 ft during 2MWT    Baseline 42 ft with cane and CGA    Time 6    Period Weeks    Status New    Target Date 08/28/20                    Plan - 07/17/20 1502     Clinical Impression Statement 80 yo lady with decline in functional mobility and activity tolerance with limited ambulation speed, decreased dynamic balance,LBP, and requiring increased need for caregiver assistance due to deficits. Pt would benefit from PT services to improve general strength, increase endurance/activity tolerance, facilitate improved balance/postural stability, and decrease c/o pain to reduce level of assistance from caregivers and decrease risk for falls    Personal Factors and Comorbidities Age;Comorbidity 1;Time since onset of injury/illness/exacerbation;Fitness    Comorbidities PMH    Examination-Activity Limitations Bed Mobility;Carry;Lift;Stand;Stairs;Squat;Locomotion Level;Transfers    Examination-Participation Restrictions Cleaning    Stability/Clinical Decision Making Evolving/Moderate complexity    Clinical Decision Making Moderate    Rehab Potential Good    PT Frequency 2x / week    PT Duration 6 weeks    PT Treatment/Interventions ADLs/Self Care Home Management;Electrical Stimulation;DME Instruction;Gait training;Stair training;Functional mobility training;Therapeutic activities;Therapeutic exercise;Balance training;Patient/family education;Neuromuscular re-education;Manual techniques    PT Next Visit Plan develop BLE HEP    Consulted and Agree with Plan of Care Patient;Family member/caregiver    Family Member Consulted daughter             Patient will benefit from skilled therapeutic intervention in  order to improve the following deficits and impairments:  Abnormal gait, Decreased activity tolerance, Decreased balance, Decreased mobility, Decreased endurance, Decreased strength, Difficulty walking, Impaired perceived functional ability, Postural dysfunction, Improper body mechanics, Pain  Visit Diagnosis: Muscle weakness (generalized)  Other abnormalities of gait and mobility  Chronic low back pain, unspecified back pain laterality, unspecified whether sciatica present     Problem List Patient Active Problem List   Diagnosis Date Noted   Syncope 10/12/2014   Diabetes ([place]) 10/12/2014   Hypertension 10/12/2014   OSTEOARTHRITIS, 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   TRIGGER FINGER 04/27/2007   3:16 PM, 07/17/20 M. Sherlyn Lees, PT, DPT Physical Therapist- [facility] Office Number: [phone]   [facility] [address] [place], [place], [address] Phone: [phone]   Fax:  [phone]  Name: [name] MRN: [id number] Date of Birth: [birth year]

## 2020-07-22 ENCOUNTER — Other Ambulatory Visit: Payer: Self-pay

## 2020-07-22 ENCOUNTER — Encounter (HOSPITAL_COMMUNITY): Payer: Self-pay | Admitting: Physical Therapy

## 2020-07-22 ENCOUNTER — Ambulatory Visit (HOSPITAL_COMMUNITY): Payer: Medicare Other | Admitting: Physical Therapy

## 2020-07-22 DIAGNOSIS — M6281 Muscle weakness (generalized): Secondary | ICD-10-CM

## 2020-07-22 DIAGNOSIS — M545 Low back pain, unspecified: Secondary | ICD-10-CM | POA: Diagnosis not present

## 2020-07-22 DIAGNOSIS — R2689 Other abnormalities of gait and mobility: Secondary | ICD-10-CM

## 2020-07-22 DIAGNOSIS — G8929 Other chronic pain: Secondary | ICD-10-CM

## 2020-07-22 NOTE — Therapy (Signed)
[facility] [place], [place], [address] Phone: [phone]   Fax:  [phone]  Physical Therapy Treatment  Patient Details  Name: [name] MRN: [id number] Date of Birth: Jul 26, 1940 Referring Provider (PT): Phillips Odor, MD   Encounter Date: 07/22/2020   PT End of Session - 07/22/20 1517     Visit Number 2    Number of Visits 12    Date for PT Re-Evaluation 08/28/20    Authorization Type UHC Medicare, no VL, no auth    PT Start Time 1500   Pt here at 1451 but not checked in untill 1500   PT Stop Time 1530    PT Time Calculation (min) 30 min    Equipment Utilized During Treatment Gait belt    Activity Tolerance Patient limited by fatigue;Patient limited by pain    Behavior During Therapy WFL for tasks assessed/performed             Past Medical History:  Diagnosis Date   Diabetes mellitus    GERD (gastroesophageal reflux disease)    Hypertension    Parkinson disease ([place])    Scoliosis    Scoliosis    UTI (lower urinary tract infection)     Past Surgical History:  Procedure Laterality Date   boil Left    boil removed from left cheek   EYE SURGERY     Retina Detachment   MASS EXCISION N/A 06/27/2014   Procedure: EXCISION SUBMANDIBULAR STONE;  Surgeon: Izora Gala, MD;  Location: [facility];  Service: ENT;  Laterality: N/A;   sublingual gland removal  2016   tumor removed from rib      There were no vitals filed for this visit.   Subjective Assessment - 07/22/20 1502     Subjective PT states her back hurts due to scoliosis.    Currently in Pain? Yes    Pain Score --   12/12   Pain Location Back    Pain Orientation Lower    Pain Descriptors / Indicators Aching    Pain Type Chronic pain                               [facility] Adult PT Treatment/Exercise - 07/22/20 0001       Exercises   Exercises Lumbar      Lumbar Exercises: Seated   Long Arc Quad on Chair Both;10 reps     LAQ on Chair Weights (lbs) 3    Sit to Stand 10 reps    Other Seated Lumbar Exercises tall posture, scapular retraction x 10    Other Seated Lumbar Exercises ab set x10      Lumbar Exercises: Supine   Bridge --    Bridge with [employer] 10 reps      Lumbar Exercises: Sidelying   Hip Abduction 10 reps;Both                      PT Short Term Goals - 07/22/20 1513       PT SHORT TERM GOAL #1   Title Patient will be independent with HEP in order to improve functional outcomes.    Time 3    Period Weeks    Status On-going    Target Date 08/07/20      PT SHORT TERM GOAL #2   Title Patient will report at least 25% improvement in symptoms for improved  quality of life.    Baseline 8/10 back pain    Time 3    Period Weeks    Status On-going    Target Date 08/07/20      PT SHORT TERM GOAL #3   Title Demo improved BLE strength and balance as evidenced by 20 sec 5xSTS without use of BUE    Baseline 25.7 sec with BUE support required    Time 3    Period Weeks    Status On-going    Target Date 08/07/20               PT Long Term Goals - 07/22/20 1514       PT LONG TERM GOAL #1   Title Patient will report at least 50% improvement in symptoms for improved quality of life.    Baseline 8/10 back pain    Time 6    Period Weeks    Status On-going      PT LONG TERM GOAL #2   Title Demo improved BLE function and balance as evidenced by 5xSTS in 15 sec    Baseline 25.7 sec BUE assist    Time 6    Period Weeks    Status On-going      PT LONG TERM GOAL #3   Title Demo improved gait velocity and tolerance as evidenced by distance of 100 ft during 2MWT    Baseline 42 ft with cane and CGA    Time 6    Period Weeks    Status On-going                   Plan - 07/22/20 1518     Clinical Impression Statement Evaluation and goals reviewed with pt.  Therapist started pt on a inital HEP.  PT needs verbal cuing for proper posture while completing  exercises.    Personal Factors and Comorbidities Age;Comorbidity 1;Time since onset of injury/illness/exacerbation;Fitness    Comorbidities PMH    Examination-Activity Limitations Bed Mobility;Carry;Lift;Stand;Stairs;Squat;Locomotion Level;Transfers    Examination-Participation Restrictions Cleaning    Stability/Clinical Decision Making Evolving/Moderate complexity    Rehab Potential Good    PT Frequency 2x / week    PT Duration 6 weeks    PT Treatment/Interventions ADLs/Self Care Home Management;Electrical Stimulation;DME Instruction;Gait training;Stair training;Functional mobility training;Therapeutic activities;Therapeutic exercise;Balance training;Patient/family education;Neuromuscular re-education;Manual techniques    PT Next Visit Plan continue strengthening especially of the posture and gluteal mm B    PT Home Exercise Plan sit tall, scapular retraction, ab set, LAQ, bridge    Consulted and Agree with Plan of Care Patient;Family member/caregiver    Family Member Consulted daughter             Patient will benefit from skilled therapeutic intervention in order to improve the following deficits and impairments:  Abnormal gait, Decreased activity tolerance, Decreased balance, Decreased mobility, Decreased endurance, Decreased strength, Difficulty walking, Impaired perceived functional ability, Postural dysfunction, Improper body mechanics, Pain  Visit Diagnosis: Muscle weakness (generalized)  Other abnormalities of gait and mobility  Chronic low back pain, unspecified back pain laterality, unspecified whether sciatica present     Problem List Patient Active Problem List   Diagnosis Date Noted   Syncope 10/12/2014   Diabetes ([place]) 10/12/2014   Hypertension 10/12/2014   OSTEOARTHRITIS, 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   TRIGGER FINGER 04/27/2007   Rayetta Humphrey, PT CLT [phone]  07/22/2020, 5:07 PM  [facility] [place], [place], [address] Phone: [phone]  Fax:  [phone]  Name: [name] MRN: [id number] Date of Birth: [birth year]

## 2020-07-24 ENCOUNTER — Encounter (HOSPITAL_COMMUNITY): Payer: Medicare Other | Admitting: Physical Therapy

## 2020-07-31 ENCOUNTER — Encounter (HOSPITAL_COMMUNITY): Payer: Medicare Other

## 2020-08-01 ENCOUNTER — Encounter (HOSPITAL_COMMUNITY): Payer: Medicare Other | Admitting: Physical Therapy

## 2020-08-02 ENCOUNTER — Encounter (HOSPITAL_COMMUNITY): Payer: Medicare Other

## 2020-08-06 ENCOUNTER — Ambulatory Visit (HOSPITAL_COMMUNITY): Payer: Medicare Other | Attending: Neurology | Admitting: Physical Therapy

## 2020-08-06 ENCOUNTER — Other Ambulatory Visit: Payer: Self-pay

## 2020-08-06 DIAGNOSIS — M545 Low back pain, unspecified: Secondary | ICD-10-CM | POA: Diagnosis not present

## 2020-08-06 DIAGNOSIS — G8929 Other chronic pain: Secondary | ICD-10-CM | POA: Insufficient documentation

## 2020-08-06 DIAGNOSIS — R1312 Dysphagia, oropharyngeal phase: Secondary | ICD-10-CM | POA: Diagnosis not present

## 2020-08-06 DIAGNOSIS — M6281 Muscle weakness (generalized): Secondary | ICD-10-CM | POA: Insufficient documentation

## 2020-08-06 DIAGNOSIS — R2689 Other abnormalities of gait and mobility: Secondary | ICD-10-CM | POA: Diagnosis not present

## 2020-08-06 NOTE — Therapy (Signed)
[facility] [place], [place], [address] Phone: [phone]   Fax:  [phone]  Physical Therapy Treatment  Patient Details  Name: [name] MRN: [id number] Date of Birth: 1940-01-29 Referring Provider (PT): Phillips Odor, MD   Encounter Date: 08/06/2020   PT End of Session - 08/06/20 1531     Visit Number 3    Number of Visits 12    Date for PT Re-Evaluation 08/28/20    Authorization Type UHC Medicare, no VL, no auth    PT Start Time 1523    PT Stop Time 1555    PT Time Calculation (min) 32 min    Equipment Utilized During Treatment Gait belt    Activity Tolerance Patient limited by fatigue;Other (comment)   nausea   Behavior During Therapy WFL for tasks assessed/performed             Past Medical History:  Diagnosis Date   Diabetes mellitus    GERD (gastroesophageal reflux disease)    Hypertension    Parkinson disease ([place])    Scoliosis    Scoliosis    UTI (lower urinary tract infection)     Past Surgical History:  Procedure Laterality Date   boil Left    boil removed from left cheek   EYE SURGERY     Retina Detachment   MASS EXCISION N/A 06/27/2014   Procedure: EXCISION SUBMANDIBULAR STONE;  Surgeon: Izora Gala, MD;  Location: [facility];  Service: ENT;  Laterality: N/A;   sublingual gland removal  2016   tumor removed from rib      There were no vitals filed for this visit.   Subjective Assessment - 08/06/20 1531     Subjective Back is still hurting. Has some good days and some bad days. Has been doing HEP "A little bit". Reports no pain currently.    Patient is accompained by: Family member   Daughter   Currently in Pain? No/denies                               [facility] Adult PT Treatment/Exercise - 08/06/20 0001       Lumbar Exercises: Supine   Ab Set 10 reps    Bridge 10 reps    Straight Leg Raise 10 reps    Other Supine Lumbar Exercises decompression series 1-5 10 x  3" each                      PT Short Term Goals - 07/22/20 1513       PT SHORT TERM GOAL #1   Title Patient will be independent with HEP in order to improve functional outcomes.    Time 3    Period Weeks    Status On-going    Target Date 08/07/20      PT SHORT TERM GOAL #2   Title Patient will report at least 25% improvement in symptoms for improved quality of life.    Baseline 8/10 back pain    Time 3    Period Weeks    Status On-going    Target Date 08/07/20      PT SHORT TERM GOAL #3   Title Demo improved BLE strength and balance as evidenced by 20 sec 5xSTS without use of BUE    Baseline 25.7 sec with BUE support required    Time 3    Period Weeks  Status On-going    Target Date 08/07/20               PT Long Term Goals - 07/22/20 1514       PT LONG TERM GOAL #1   Title Patient will report at least 50% improvement in symptoms for improved quality of life.    Baseline 8/10 back pain    Time 6    Period Weeks    Status On-going      PT LONG TERM GOAL #2   Title Demo improved BLE function and balance as evidenced by 5xSTS in 15 sec    Baseline 25.7 sec BUE assist    Time 6    Period Weeks    Status On-going      PT LONG TERM GOAL #3   Title Demo improved gait velocity and tolerance as evidenced by distance of 100 ft during 2MWT    Baseline 42 ft with cane and CGA    Time 6    Period Weeks    Status On-going                   Plan - 08/06/20 1622     Clinical Impression Statement Continued with established POC for postural strengthening. Patient tolerated well until becoming insidiously nauseous. Patient with emesis. Patient allowed several minutes to rest. She and her daughter speculate this was due to low blood sugar. Patient had several candies and water. Noting return to relative baseline within several minutes. Patient does not appear in undue distress at end of session. Will resume activity as tolerated.    Personal  Factors and Comorbidities Age;Comorbidity 1;Time since onset of injury/illness/exacerbation;Fitness    Comorbidities PMH    Examination-Activity Limitations Bed Mobility;Carry;Lift;Stand;Stairs;Squat;Locomotion Level;Transfers    Examination-Participation Restrictions Cleaning    Stability/Clinical Decision Making Evolving/Moderate complexity    Rehab Potential Good    PT Frequency 2x / week    PT Duration 6 weeks    PT Treatment/Interventions ADLs/Self Care Home Management;Electrical Stimulation;DME Instruction;Gait training;Stair training;Functional mobility training;Therapeutic activities;Therapeutic exercise;Balance training;Patient/family education;Neuromuscular re-education;Manual techniques    PT Next Visit Plan continue strengthening especially of the posture and gluteal mm B    PT Home Exercise Plan sit tall, scapular retraction, ab set, LAQ, bridge    Consulted and Agree with Plan of Care Patient;Family member/caregiver    Family Member Consulted daughter             Patient will benefit from skilled therapeutic intervention in order to improve the following deficits and impairments:  Abnormal gait, Decreased activity tolerance, Decreased balance, Decreased mobility, Decreased endurance, Decreased strength, Difficulty walking, Impaired perceived functional ability, Postural dysfunction, Improper body mechanics, Pain  Visit Diagnosis: Muscle weakness (generalized)  Other abnormalities of gait and mobility  Chronic low back pain, unspecified back pain laterality, unspecified whether sciatica present     Problem List Patient Active Problem List   Diagnosis Date Noted   Syncope 10/12/2014   Diabetes ([place]) 10/12/2014   Hypertension 10/12/2014   OSTEOARTHRITIS, 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   TRIGGER FINGER 04/27/2007   4:24 PM, 08/06/20 [name]  Physical Therapist with [facility]  [phone] [place] [address] [place], [place], [address] Phone: [phone]   Fax:  [phone]  Name: [name] MRN: [id number] Date of Birth: 02/11/1940

## 2020-08-08 ENCOUNTER — Encounter (HOSPITAL_COMMUNITY): Payer: Self-pay

## 2020-08-08 ENCOUNTER — Ambulatory Visit (HOSPITAL_COMMUNITY): Payer: Medicare Other

## 2020-08-08 ENCOUNTER — Other Ambulatory Visit: Payer: Self-pay

## 2020-08-08 DIAGNOSIS — M6281 Muscle weakness (generalized): Secondary | ICD-10-CM | POA: Diagnosis not present

## 2020-08-08 DIAGNOSIS — R2689 Other abnormalities of gait and mobility: Secondary | ICD-10-CM | POA: Diagnosis not present

## 2020-08-08 DIAGNOSIS — G8929 Other chronic pain: Secondary | ICD-10-CM | POA: Diagnosis not present

## 2020-08-08 DIAGNOSIS — M545 Low back pain, unspecified: Secondary | ICD-10-CM | POA: Diagnosis not present

## 2020-08-08 DIAGNOSIS — R1312 Dysphagia, oropharyngeal phase: Secondary | ICD-10-CM | POA: Diagnosis not present

## 2020-08-08 NOTE — Patient Instructions (Addendum)
Bridge    Lie back, legs bent. Inhale, pressing hips up. Keeping ribs in, lengthen lower back. Exhale, rolling down along spine from top. Repeat 10 times. Do 2 sessions per day.  http://pm.exer.us/55   Copyright  VHI. All rights reserved.  On Elbows (Prone)    Lay with your hips laying on a pillow.  Rise up on elbows as high as possible, keeping hips on floor. Hold 60 seconds. Repeat 1 times per set. Do 2 sets per day.  http://orth.exer.us/93   Copyright  VHI. All rights reserved.

## 2020-08-08 NOTE — Therapy (Signed)
[facility] [place], [place], [address] Phone: [phone]   Fax:  [phone]  Physical Therapy Treatment  Patient Details  Name: [name] MRN: [id number] Date of Birth: 1940-01-25 Referring Provider (PT): Phillips Odor, MD   Encounter Date: 08/08/2020   PT End of Session - 08/08/20 1511     Visit Number 4    Number of Visits 12    Date for PT Re-Evaluation 08/28/20    Authorization Type UHC Medicare, no VL, no auth    Progress Note Due on Visit 10    PT Start Time 3762   late arrival   PT Stop Time 1532    PT Time Calculation (min) 34 min    Equipment Utilized During Treatment Gait belt    Activity Tolerance Patient limited by fatigue;Patient tolerated treatment well    Behavior During Therapy WFL for tasks assessed/performed             Past Medical History:  Diagnosis Date   Diabetes mellitus    GERD (gastroesophageal reflux disease)    Hypertension    Parkinson disease ([place])    Scoliosis    Scoliosis    UTI (lower urinary tract infection)     Past Surgical History:  Procedure Laterality Date   boil Left    boil removed from left cheek   EYE SURGERY     Retina Detachment   MASS EXCISION N/A 06/27/2014   Procedure: EXCISION SUBMANDIBULAR STONE;  Surgeon: Izora Gala, MD;  Location: [facility];  Service: ENT;  Laterality: N/A;   sublingual gland removal  2016   tumor removed from rib      There were no vitals filed for this visit.   Subjective Assessment - 08/08/20 1504     Subjective Pt stated she checked her BP earlier today, 143.  Reports her back is bothering her today.    Patient is accompained by: Family member   daughter   Currently in Pain? Yes    Pain Score 10-Worst pain ever    Pain Location Back    Pain Orientation Lower    Pain Descriptors / Indicators Aching    Pain Type Chronic pain    Pain Onset More than a month ago    Pain Frequency Intermittent    Aggravating Factors  not  sure    Pain Relieving Factors pain patches, oil    Effect of Pain on Daily Activities limits                               [facility] Adult PT Treatment/Exercise - 08/08/20 0001       Posture/Postural Control   Posture/Postural Control Postural limitations    Postural Limitations Flexed trunk      Exercises   Exercises Lumbar      Lumbar Exercises: Stretches   Prone on Elbows Stretch 2 reps;60 seconds      Lumbar Exercises: Seated   Sit to Stand 10 reps    Sit to Stand Limitations standard height, no HHA, eccentric control    Other Seated Lumbar Exercises tall posture 2 min    Other Seated Lumbar Exercises ab set, rows with RTB, wback with tactile and verbal cueing      Lumbar Exercises: Prone   Other Prone Lumbar Exercises heel squeeze 5x 5"  PT Short Term Goals - 07/22/20 1513       PT SHORT TERM GOAL #1   Title Patient will be independent with HEP in order to improve functional outcomes.    Time 3    Period Weeks    Status On-going    Target Date 08/07/20      PT SHORT TERM GOAL #2   Title Patient will report at least 25% improvement in symptoms for improved quality of life.    Baseline 8/10 back pain    Time 3    Period Weeks    Status On-going    Target Date 08/07/20      PT SHORT TERM GOAL #3   Title Demo improved BLE strength and balance as evidenced by 20 sec 5xSTS without use of BUE    Baseline 25.7 sec with BUE support required    Time 3    Period Weeks    Status On-going    Target Date 08/07/20               PT Long Term Goals - 07/22/20 1514       PT LONG TERM GOAL #1   Title Patient will report at least 50% improvement in symptoms for improved quality of life.    Baseline 8/10 back pain    Time 6    Period Weeks    Status On-going      PT LONG TERM GOAL #2   Title Demo improved BLE function and balance as evidenced by 5xSTS in 15 sec    Baseline 25.7 sec BUE assist    Time 6     Period Weeks    Status On-going      PT LONG TERM GOAL #3   Title Demo improved gait velocity and tolerance as evidenced by distance of 100 ft during 2MWT    Baseline 42 ft with cane and CGA    Time 6    Period Weeks    Status On-going                   Plan - 08/08/20 1838     Clinical Impression Statement Session focus on postural strengthening and gluteal strengthening.  Pt with difficulty holding good posture and required verbal and tactile cueing to reduce forward head and rolled shoulders.  Added Wback and theraband rows for postural strengthening.  Began POE with initial c/o abdominal pain that was relieved wiht pillow under hips.  Heel squeeze for gluteal strengthening.  EOS pt reoprts pain reduced.  Added POE and bridges to HEP with printout given.    Personal Factors and Comorbidities Age;Comorbidity 1;Time since onset of injury/illness/exacerbation;Fitness    Comorbidities PMH    Examination-Activity Limitations Bed Mobility;Carry;Lift;Stand;Stairs;Squat;Locomotion Level;Transfers    Examination-Participation Restrictions Cleaning    Stability/Clinical Decision Making Evolving/Moderate complexity    Clinical Decision Making Moderate    Rehab Potential Good    PT Frequency 2x / week    PT Duration 6 weeks    PT Treatment/Interventions ADLs/Self Care Home Management;Electrical Stimulation;DME Instruction;Gait training;Stair training;Functional mobility training;Therapeutic activities;Therapeutic exercise;Balance training;Patient/family education;Neuromuscular re-education;Manual techniques    PT Next Visit Plan continue strengthening especially of the posture and gluteal mm B    PT Home Exercise Plan sit tall, scapular retraction, ab set, LAQ, bridge; 08/08/20: POE and bridge as stated she was not doing at home.    Consulted and Agree with Plan of Care Patient;Family member/caregiver    Family Member Consulted daughter  Patient will benefit from skilled  therapeutic intervention in order to improve the following deficits and impairments:  Abnormal gait, Decreased activity tolerance, Decreased balance, Decreased mobility, Decreased endurance, Decreased strength, Difficulty walking, Impaired perceived functional ability, Postural dysfunction, Improper body mechanics, Pain  Visit Diagnosis: Muscle weakness (generalized)  Other abnormalities of gait and mobility  Chronic low back pain, unspecified back pain laterality, unspecified whether sciatica present     Problem List Patient Active Problem List   Diagnosis Date Noted   Syncope 10/12/2014   Diabetes ([place]) 10/12/2014   Hypertension 10/12/2014   OSTEOARTHRITIS, 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   TRIGGER FINGER 04/27/2007   Ihor Austin, LPTA/CLT; CBIS [phone]  Aldona Lento 08/08/2020, 6:42 PM  [facility] [address] [place], [place], [address] Phone: [phone]   Fax:  [phone]  Name: [name] MRN: [id number] Date of Birth: 01/15/1940

## 2020-08-13 ENCOUNTER — Other Ambulatory Visit: Payer: Self-pay

## 2020-08-13 ENCOUNTER — Ambulatory Visit (HOSPITAL_COMMUNITY): Payer: Medicare Other

## 2020-08-13 DIAGNOSIS — M545 Low back pain, unspecified: Secondary | ICD-10-CM | POA: Diagnosis not present

## 2020-08-13 DIAGNOSIS — R2689 Other abnormalities of gait and mobility: Secondary | ICD-10-CM

## 2020-08-13 DIAGNOSIS — M6281 Muscle weakness (generalized): Secondary | ICD-10-CM

## 2020-08-13 DIAGNOSIS — G8929 Other chronic pain: Secondary | ICD-10-CM | POA: Diagnosis not present

## 2020-08-13 DIAGNOSIS — R1312 Dysphagia, oropharyngeal phase: Secondary | ICD-10-CM | POA: Diagnosis not present

## 2020-08-13 NOTE — Therapy (Signed)
[facility] [place], [place], [address] Phone: [phone]   Fax:  [phone]  Physical Therapy Treatment  Patient Details  Name: [name] MRN: [id number] Date of Birth: 23-Dec-1940 Referring Provider (PT): Phillips Odor, MD   Encounter Date: 08/13/2020   PT End of Session - 08/13/20 1451     Visit Number 5    Number of Visits 12    Date for PT Re-Evaluation 08/28/20    Authorization Type UHC Medicare, no VL, no auth    Progress Note Due on Visit 10    PT Start Time 1435    PT Stop Time 1520    PT Time Calculation (min) 45 min    Equipment Utilized During Treatment Gait belt    Activity Tolerance Patient limited by fatigue;Patient tolerated treatment well    Behavior During Therapy WFL for tasks assessed/performed             Past Medical History:  Diagnosis Date   Diabetes mellitus    GERD (gastroesophageal reflux disease)    Hypertension    Parkinson disease ([place])    Scoliosis    Scoliosis    UTI (lower urinary tract infection)     Past Surgical History:  Procedure Laterality Date   boil Left    boil removed from left cheek   EYE SURGERY     Retina Detachment   MASS EXCISION N/A 06/27/2014   Procedure: EXCISION SUBMANDIBULAR STONE;  Surgeon: Izora Gala, MD;  Location: [facility];  Service: ENT;  Laterality: N/A;   sublingual gland removal  2016   tumor removed from rib      There were no vitals filed for this visit.   Subjective Assessment - 08/13/20 1459     Subjective "Just trying to make it"    Patient is accompained by: Family member   daughter   Currently in Pain? Yes    Pain Score 9     Pain Location Back    Pain Orientation Lower    Pain Descriptors / Indicators Aching    Pain Type Chronic pain    Pain Onset More than a month ago                [facility] PT Assessment - 08/13/20 0001       Assessment   Medical Diagnosis abnormalities of gait and mobility    Referring Provider  (PT) Phillips Odor, MD                           [facility] Adult PT Treatment/Exercise - 08/13/20 0001       Ambulation/Gait   Ambulation/Gait Yes    Ambulation/Gait Assistance 5: Supervision    Ambulation Distance (Feet) 100 Feet    Assistive device 4-wheeled walker    Gait Pattern Step-through pattern;Trunk flexed    Ambulation Surface Level;Indoor    Gait velocity decreased    Gait Comments cues in promoting upright posture and hip extensor "squeeze" at  terminal stance to promote hip/trunk extension.      Lumbar Exercises: Stretches   Standing Extension 2 reps;60 seconds    Standing Extension Limitations with green ball against wall for added extension ROM    Other Lumbar Stretch Exercise wall slides with BUE for extension stretch 3x10      Lumbar Exercises: Aerobic   Nustep level 1 x 8 min for dynamic warm-up and reciprocal motion  Lumbar Exercises: Seated   Sit to Stand 10 reps    Sit to Stand Limitations elevated EOM to facilitate use of BLE vs UE assist    Other Seated Lumbar Exercises seated ankle pumps, LAQ 3x10 reps                      PT Short Term Goals - 07/22/20 1513       PT SHORT TERM GOAL #1   Title Patient will be independent with HEP in order to improve functional outcomes.    Time 3    Period Weeks    Status On-going    Target Date 08/07/20      PT SHORT TERM GOAL #2   Title Patient will report at least 25% improvement in symptoms for improved quality of life.    Baseline 8/10 back pain    Time 3    Period Weeks    Status On-going    Target Date 08/07/20      PT SHORT TERM GOAL #3   Title Demo improved BLE strength and balance as evidenced by 20 sec 5xSTS without use of BUE    Baseline 25.7 sec with BUE support required    Time 3    Period Weeks    Status On-going    Target Date 08/07/20               PT Long Term Goals - 07/22/20 1514       PT LONG TERM GOAL #1   Title Patient will report at least  50% improvement in symptoms for improved quality of life.    Baseline 8/10 back pain    Time 6    Period Weeks    Status On-going      PT LONG TERM GOAL #2   Title Demo improved BLE function and balance as evidenced by 5xSTS in 15 sec    Baseline 25.7 sec BUE assist    Time 6    Period Weeks    Status On-going      PT LONG TERM GOAL #3   Title Demo improved gait velocity and tolerance as evidenced by distance of 100 ft during 2MWT    Baseline 42 ft with cane and CGA    Time 6    Period Weeks    Status On-going                   Plan - 08/13/20 1508     Clinical Impression Statement Difficulty with maintaining upright posture without UE support on AD/furniture.  Improved LE strength as evidenced by ability to perform 10x sit to stand from 24" EOM without UE support.  Continued sessions indicated to improve BLE and trunk strength to improve postural capabilities, balanace, and reduce risk for falls.    Personal Factors and Comorbidities Age;Comorbidity 1;Time since onset of injury/illness/exacerbation;Fitness    Comorbidities PMH    Examination-Activity Limitations Bed Mobility;Carry;Lift;Stand;Stairs;Squat;Locomotion Level;Transfers    Examination-Participation Restrictions Cleaning    Stability/Clinical Decision Making Evolving/Moderate complexity    Rehab Potential Good    PT Frequency 2x / week    PT Duration 6 weeks    PT Treatment/Interventions ADLs/Self Care Home Management;Electrical Stimulation;DME Instruction;Gait training;Stair training;Functional mobility training;Therapeutic activities;Therapeutic exercise;Balance training;Patient/family education;Neuromuscular re-education;Manual techniques    PT Next Visit Plan continue strengthening especially of the posture and gluteal mm B    PT Home Exercise Plan sit tall, scapular retraction, ab set, LAQ, bridge; 08/08/20: POE and  bridge as stated she was not doing at home.    Consulted and Agree with Plan of Care  Patient;Family member/caregiver    Family Member Consulted daughter             Patient will benefit from skilled therapeutic intervention in order to improve the following deficits and impairments:  Abnormal gait, Decreased activity tolerance, Decreased balance, Decreased mobility, Decreased endurance, Decreased strength, Difficulty walking, Impaired perceived functional ability, Postural dysfunction, Improper body mechanics, Pain  Visit Diagnosis: Muscle weakness (generalized)  Other abnormalities of gait and mobility  Chronic low back pain, unspecified back pain laterality, unspecified whether sciatica present     Problem List Patient Active Problem List   Diagnosis Date Noted   Syncope 10/12/2014   Diabetes ([place]) 10/12/2014   Hypertension 10/12/2014   OSTEOARTHRITIS, 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   TRIGGER FINGER 04/27/2007    Toniann Fail 08/13/2020, 3:11 PM  [facility] [address] [place], [place], [address] Phone: [phone]   Fax:  [phone]  Name: [name] MRN: [id number] Date of Birth: 02/12/1940

## 2020-08-15 ENCOUNTER — Ambulatory Visit (HOSPITAL_COMMUNITY): Payer: Medicare Other | Admitting: Physical Therapy

## 2020-08-15 ENCOUNTER — Other Ambulatory Visit: Payer: Self-pay

## 2020-08-15 DIAGNOSIS — R2689 Other abnormalities of gait and mobility: Secondary | ICD-10-CM

## 2020-08-15 DIAGNOSIS — M6281 Muscle weakness (generalized): Secondary | ICD-10-CM | POA: Diagnosis not present

## 2020-08-15 DIAGNOSIS — G8929 Other chronic pain: Secondary | ICD-10-CM | POA: Diagnosis not present

## 2020-08-15 DIAGNOSIS — M545 Low back pain, unspecified: Secondary | ICD-10-CM | POA: Diagnosis not present

## 2020-08-15 DIAGNOSIS — R1312 Dysphagia, oropharyngeal phase: Secondary | ICD-10-CM | POA: Diagnosis not present

## 2020-08-15 NOTE — Therapy (Signed)
[facility] [place], [place], [address] Phone: [phone]   Fax:  [phone]  Physical Therapy Treatment  Patient Details  Name: [name] MRN: [id number] Date of Birth: 10-Sep-1940 Referring Provider (PT): Phillips Odor, MD   Encounter Date: 08/15/2020   PT End of Session - 08/15/20 1542     Visit Number 6    Number of Visits 12    Date for PT Re-Evaluation 08/28/20    Authorization Type UHC Medicare, no VL, no auth    Progress Note Due on Visit 10    PT Start Time 1455    PT Stop Time 1535    PT Time Calculation (min) 40 min    Equipment Utilized During Treatment Gait belt    Activity Tolerance Patient limited by fatigue;Patient tolerated treatment well    Behavior During Therapy WFL for tasks assessed/performed             Past Medical History:  Diagnosis Date   Diabetes mellitus    GERD (gastroesophageal reflux disease)    Hypertension    Parkinson disease ([place])    Scoliosis    Scoliosis    UTI (lower urinary tract infection)     Past Surgical History:  Procedure Laterality Date   boil Left    boil removed from left cheek   EYE SURGERY     Retina Detachment   MASS EXCISION N/A 06/27/2014   Procedure: EXCISION SUBMANDIBULAR STONE;  Surgeon: Izora Gala, MD;  Location: [facility];  Service: ENT;  Laterality: N/A;   sublingual gland removal  2016   tumor removed from rib      There were no vitals filed for this visit.   Subjective Assessment - 08/15/20 1528     Subjective Pt states she almost called in today becuase she's having increased pain of 910 in lumbar due to fall last night off the toilet.  States her grand daughter had to get her up off the floor.    Currently in Pain? Yes    Pain Score 9     Pain Location Back    Pain Orientation Lower;Medial    Pain Descriptors / Indicators Aching;Sore                               [facility] Adult PT Treatment/Exercise - 08/15/20  0001       Ambulation/Gait   Ambulation/Gait Yes    Ambulation/Gait Assistance 5: Supervision    Ambulation Distance (Feet) 200 Feet   2 bouts of 100 feet   Assistive device 4-wheeled walker    Gait Pattern Step-through pattern;Trunk flexed    Ambulation Surface Level    Gait velocity decreased      Lumbar Exercises: Seated   Sit to Stand 5 reps    Sit to Stand Limitations elevated EOM to facilitate use of BLE vs UE assist      Lumbar Exercises: Supine   Bridge 10 reps    Straight Leg Raise 5 reps    Straight Leg Raises Limitations AAROM for 5 reps Lt, AROM RT    Other Supine Lumbar Exercises logroll technique                      PT Short Term Goals - 07/22/20 1513       PT SHORT TERM GOAL #1   Title Patient will be independent with HEP  in order to improve functional outcomes.    Time 3    Period Weeks    Status On-going    Target Date 08/07/20      PT SHORT TERM GOAL #2   Title Patient will report at least 25% improvement in symptoms for improved quality of life.    Baseline 8/10 back pain    Time 3    Period Weeks    Status On-going    Target Date 08/07/20      PT SHORT TERM GOAL #3   Title Demo improved BLE strength and balance as evidenced by 20 sec 5xSTS without use of BUE    Baseline 25.7 sec with BUE support required    Time 3    Period Weeks    Status On-going    Target Date 08/07/20               PT Long Term Goals - 07/22/20 1514       PT LONG TERM GOAL #1   Title Patient will report at least 50% improvement in symptoms for improved quality of life.    Baseline 8/10 back pain    Time 6    Period Weeks    Status On-going      PT LONG TERM GOAL #2   Title Demo improved BLE function and balance as evidenced by 5xSTS in 15 sec    Baseline 25.7 sec BUE assist    Time 6    Period Weeks    Status On-going      PT LONG TERM GOAL #3   Title Demo improved gait velocity and tolerance as evidenced by distance of 100 ft during 2MWT     Baseline 42 ft with cane and CGA    Time 6    Period Weeks    Status On-going                   Plan - 08/15/20 1541     Clinical Impression Statement Pt requested to complete exercises on mat today due to increased soreness/pain from fall last night.  Began with sit to stands from standard height chair with ability to complete without use of UE's, however fatigued following 5 repetitions.  Bridge completed with descent hip clearance.  Unable to complete SLR with quality on Lt without AAROM due to weakness, significant extensor lag.  Able to complete with only minimal extension lag with Rt LE but poor eccentric control.  Pt instructed with logroll technique to help reduce LB pain with mobility.  Pt with overall slow and antalgic movement today with all transfers and exercises.  Pt requires cues to stay within walker BOS with upright posturing when ambulating    Personal Factors and Comorbidities Age;Comorbidity 1;Time since onset of injury/illness/exacerbation;Fitness    Comorbidities PMH    Examination-Activity Limitations Bed Mobility;Carry;Lift;Stand;Stairs;Squat;Locomotion Level;Transfers    Examination-Participation Restrictions Cleaning    Stability/Clinical Decision Making Evolving/Moderate complexity    Rehab Potential Good    PT Frequency 2x / week    PT Duration 6 weeks    PT Treatment/Interventions ADLs/Self Care Home Management;Electrical Stimulation;DME Instruction;Gait training;Stair training;Functional mobility training;Therapeutic activities;Therapeutic exercise;Balance training;Patient/family education;Neuromuscular re-education;Manual techniques    PT Next Visit Plan continue strengthening especially of the posture and gluteal mm B.  Attempt to resume standing exercises next session.    PT Home Exercise Plan sit tall, scapular retraction, ab set, LAQ, bridge; 08/08/20: POE and bridge as stated she was not doing at home.  Consulted and Agree with Plan of Care  Patient;Family member/caregiver    Family Member Consulted daughter             Patient will benefit from skilled therapeutic intervention in order to improve the following deficits and impairments:  Abnormal gait, Decreased activity tolerance, Decreased balance, Decreased mobility, Decreased endurance, Decreased strength, Difficulty walking, Impaired perceived functional ability, Postural dysfunction, Improper body mechanics, Pain  Visit Diagnosis: Muscle weakness (generalized)  Other abnormalities of gait and mobility  Chronic low back pain, unspecified back pain laterality, unspecified whether sciatica present     Problem List Patient Active Problem List   Diagnosis Date Noted   Syncope 10/12/2014   Diabetes ([place]) 10/12/2014   Hypertension 10/12/2014   OSTEOARTHRITIS, 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   TRIGGER FINGER 04/27/2007   Teena Irani, PTA/CLT [phone]  Teena Irani 08/15/2020, 3:45 PM  [facility] [address] [place], [place], [address] Phone: [phone]   Fax:  [phone]  Name: [name] MRN: [id number] Date of Birth: Oct 01, 1940

## 2020-08-20 ENCOUNTER — Ambulatory Visit (HOSPITAL_COMMUNITY): Payer: Medicare Other | Admitting: Physical Therapy

## 2020-08-20 ENCOUNTER — Other Ambulatory Visit: Payer: Self-pay

## 2020-08-20 ENCOUNTER — Encounter (HOSPITAL_COMMUNITY): Payer: Self-pay | Admitting: Physical Therapy

## 2020-08-20 DIAGNOSIS — R2689 Other abnormalities of gait and mobility: Secondary | ICD-10-CM

## 2020-08-20 DIAGNOSIS — G8929 Other chronic pain: Secondary | ICD-10-CM | POA: Diagnosis not present

## 2020-08-20 DIAGNOSIS — M6281 Muscle weakness (generalized): Secondary | ICD-10-CM

## 2020-08-20 DIAGNOSIS — R1312 Dysphagia, oropharyngeal phase: Secondary | ICD-10-CM | POA: Diagnosis not present

## 2020-08-20 DIAGNOSIS — M545 Low back pain, unspecified: Secondary | ICD-10-CM | POA: Diagnosis not present

## 2020-08-20 NOTE — Therapy (Signed)
[place], [place], [address] Phone: [phone]   Fax:  [phone]  Physical Therapy Treatment  Patient Details  Name: [name] MRN: [id number] Date of Birth: [birth year] Referring Provider (PT): Phillips Odor, MD   Encounter Date: 08/20/2020   PT End of Session - 08/20/20 1442     Visit Number 7    Number of Visits 12    Date for PT Re-Evaluation 08/28/20    Authorization Type UHC Medicare, no VL, no auth    Progress Note Due on Visit 10    PT Start Time 5366    PT Stop Time 1512    PT Time Calculation (min) 40 min    Equipment Utilized During Treatment Gait belt    Activity Tolerance Patient limited by fatigue;Patient tolerated treatment well    Behavior During Therapy WFL for tasks assessed/performed             Past Medical History:  Diagnosis Date   Diabetes mellitus    GERD (gastroesophageal reflux disease)    Hypertension    Parkinson disease ([place])    Scoliosis    Scoliosis    UTI (lower urinary tract infection)     Past Surgical History:  Procedure Laterality Date   boil Left    boil removed from left cheek   EYE SURGERY     Retina Detachment   MASS EXCISION N/A 06/27/2014   Procedure: EXCISION SUBMANDIBULAR STONE;  Surgeon: Izora Gala, MD;  Location: [facility];  Service: ENT;  Laterality: N/A;   sublingual gland removal  2016   tumor removed from rib      There were no vitals filed for this visit.   Subjective Assessment - 08/20/20 1441     Subjective No new issues. 10/10 pain in back.    Currently in Pain? Yes    Pain Score 10-Worst pain ever    Pain Location Back    Pain Orientation Lower    Pain Descriptors / Indicators Aching;Sore    Pain Type Chronic pain    Pain Onset More than a month ago    Pain Frequency Constant                               [facility] Adult PT Treatment/Exercise - 08/20/20 0001       Ambulation/Gait   Ambulation/Gait Yes     Ambulation/Gait Assistance 5: Supervision    Ambulation Distance (Feet) 200 Feet   100 x 2, cues for upright posture and cane distance   Assistive device Straight cane    Gait Pattern Decreased stride length;Trunk flexed    Ambulation Surface Level;Indoor    Gait velocity decreased      Lumbar Exercises: Aerobic   Nustep Lv 1 5 min dynamic warmup      Lumbar Exercises: Standing   Heel Raises 20 reps   2 x 10   Row 20 reps;Theraband    Theraband Level (Row) Level 2 (Red)    Other Standing Lumbar Exercises sidestepping in // bars 2 RT      Lumbar Exercises: Seated   Sit to Stand 10 reps    Sit to Stand Limitations with UEs from standard height                      PT Short Term Goals - 07/22/20 1513  PT SHORT TERM GOAL #1   Title Patient will be independent with HEP in order to improve functional outcomes.    Time 3    Period Weeks    Status On-going    Target Date 08/07/20      PT SHORT TERM GOAL #2   Title Patient will report at least 25% improvement in symptoms for improved quality of life.    Baseline 8/10 back pain    Time 3    Period Weeks    Status On-going    Target Date 08/07/20      PT SHORT TERM GOAL #3   Title Demo improved BLE strength and balance as evidenced by 20 sec 5xSTS without use of BUE    Baseline 25.7 sec with BUE support required    Time 3    Period Weeks    Status On-going    Target Date 08/07/20               PT Long Term Goals - 07/22/20 1514       PT LONG TERM GOAL #1   Title Patient will report at least 50% improvement in symptoms for improved quality of life.    Baseline 8/10 back pain    Time 6    Period Weeks    Status On-going      PT LONG TERM GOAL #2   Title Demo improved BLE function and balance as evidenced by 5xSTS in 15 sec    Baseline 25.7 sec BUE assist    Time 6    Period Weeks    Status On-going      PT LONG TERM GOAL #3   Title Demo improved gait velocity and tolerance as evidenced  by distance of 100 ft during 2MWT    Baseline 42 ft with cane and CGA    Time 6    Period Weeks    Status On-going                   Plan - 08/20/20 1555     Clinical Impression Statement Patient with improved tolerance overall today. Progressed standing activity. Added standing rows for improved upright posturing. Patient able to perform sit to stands using UEs form standard height seat with minimal reported fatigue. Patient does note fatigue with ambulation, though was able to complete 2 x 100 feet with SPC today. Patient requires frequent cues for upright posturing and to avoid placing cane too far in front of feet. Patient will continue to benefit form skilled therapy services to reduce deficits and improve functional ability.    Personal Factors and Comorbidities Age;Comorbidity 1;Time since onset of injury/illness/exacerbation;Fitness    Comorbidities PMH    Examination-Activity Limitations Bed Mobility;Carry;Lift;Stand;Stairs;Squat;Locomotion Level;Transfers    Examination-Participation Restrictions Cleaning    Stability/Clinical Decision Making Evolving/Moderate complexity    Rehab Potential Good    PT Frequency 2x / week    PT Duration 6 weeks    PT Treatment/Interventions ADLs/Self Care Home Management;Electrical Stimulation;DME Instruction;Gait training;Stair training;Functional mobility training;Therapeutic activities;Therapeutic exercise;Balance training;Patient/family education;Neuromuscular re-education;Manual techniques    PT Next Visit Plan continue strengthening especially of the posture and gluteal mm B. Step ups    PT Home Exercise Plan sit tall, scapular retraction, ab set, LAQ, bridge; 08/08/20: POE and bridge as stated she was not doing at home. 8/16 sidestepping at counter    Consulted and Agree with Plan of Care Patient;Family member/caregiver    Family Member Consulted daughter  Patient will benefit from skilled therapeutic intervention in  order to improve the following deficits and impairments:  Abnormal gait, Decreased activity tolerance, Decreased balance, Decreased mobility, Decreased endurance, Decreased strength, Difficulty walking, Impaired perceived functional ability, Postural dysfunction, Improper body mechanics, Pain  Visit Diagnosis: Muscle weakness (generalized)  Other abnormalities of gait and mobility  Chronic low back pain, unspecified back pain laterality, unspecified whether sciatica present     Problem List Patient Active Problem List   Diagnosis Date Noted   Syncope 10/12/2014   Diabetes ([place]) 10/12/2014   Hypertension 10/12/2014   OSTEOARTHRITIS, 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   TRIGGER FINGER 04/27/2007   4:00 PM, 08/20/20 [name]  Physical Therapist with [facility]  [phone] [place] [address] [place], [place], [address] Phone: [phone]   Fax:  [phone]  Name: CARLEA BADOUR MRN: [id number] Date of Birth: April 10, 1940

## 2020-08-21 ENCOUNTER — Encounter: Payer: Self-pay | Admitting: Gastroenterology

## 2020-08-21 NOTE — Progress Notes (Signed)
Referring Provider: Iona Beard, MD Primary Care Physician:  Iona Beard, MD Primary Gastroenterologist:  Dr. Gala Romney  Chief Complaint  Patient presents with   Dysphagia    Getting strangled with liquids, x 3 months     HPI:   [name] is a 80 y.o. female presenting today at the request of  Iona Beard, MD for dysphagia.  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  Today:  Gets strangled on liquids. No trouble with solids. Started 4 months ago. Feels liquids go down the wrong way. Followed by a lot of coughing. Initially symptoms were very frequent.  Symptoms have decreased in frequency with using a straw.  States she can go a week or more without symptoms.  She does have large pill dysphagia.   Taking omeprazole daily which controls GERD well unless any known triggers including red sauces or "liver, gravy, and onions".  With this, she will have associated vomiting.  If doubling up on omeprazole, symptoms improve.  No constipation, diarrhea, brbpr melena.   Last colonoscopy more than 10 years ago at [facility]. Interested in scheduling a colonoscopy.   Past Medical History:  Diagnosis Date   CKD (chronic kidney disease)    Diabetes mellitus    Diastolic heart failure ([facility])    GERD (gastroesophageal reflux disease)    Hypertension    Parkinson disease ([place])    Scoliosis    Scoliosis    UTI (lower urinary tract infection)     Past Surgical History:  Procedure Laterality Date   boil Left    boil removed from left cheek   EYE SURGERY     Retina Detachment   MASS EXCISION N/A 06/27/2014   Procedure: EXCISION SUBMANDIBULAR STONE;  Surgeon: Izora Gala, MD;  Location: [facility];  Service: ENT;  Laterality: N/A;   sublingual gland removal  2016   tumor removed from rib       Current Outpatient Medications  Medication Sig Dispense Refill   acetaminophen-codeine (TYLENOL #3) 300-30 MG tablet Take by mouth 2 (two) times daily.     aspirin EC 81 MG tablet Take 81 mg by mouth daily.     cloNIDine (CATAPRES) 0.1 MG tablet Take 0.1 mg by mouth 2 (two) times daily. For blood pressure     diltiazem (TIAZAC) 360 MG 24 hr capsule Take 360 mg by mouth daily.     erythromycin (ERY-TAB) 250 MG EC tablet Take 250 mg by mouth.     furosemide (LASIX) 20 MG tablet Take 20 mg by mouth 3 (three) times a week.     HUMALOG MIX 50/50 KWIKPEN (50-50) 100 UNIT/ML Kwikpen Take by mouth 2 (two) times daily with breakfast and lunch. 40 units in AM and 20 units in PM     metoprolol tartrate (LOPRESSOR) 25 MG tablet Take 0.5 tablets (12.5 mg total) by mouth 2 (two) times daily. 30 tablet 1   olopatadine (PATANOL) 0.1 % ophthalmic solution Place 1 drop into both eyes daily as needed for allergies.      omeprazole (PRILOSEC) 20 MG capsule Take 20 mg by mouth daily. For acid reflux     potassium chloride SA (K-DUR,KLOR-CON) 20 MEQ tablet Take 20 mEq by mouth daily.     risedronate (ACTONEL) 150 MG tablet Take 150 mg by mouth every 30 (thirty) days. with water on  empty stomach, nothing by mouth or lie down for next 30 minutes.     simvastatin (ZOCOR) 5 MG tablet Take 5 mg by mouth at bedtime.     No current facility-administered medications for this visit.    Allergies as of 08/22/2020 - Review Complete 08/22/2020  Allergen Reaction Noted   Reglan [metoclopramide]  08/22/2020    History reviewed. No pertinent family history.  Social History   Socioeconomic History   Marital status: Divorced    Spouse name: Not on file   Number of children: Not on file   Years of education: Not on file   Highest education level: Not on file  Occupational History   Not on file  Tobacco Use   Smoking status: Never   Smokeless tobacco: Never  Substance and Sexual Activity   Alcohol use: No     Alcohol/week: 0.0 standard drinks   Drug use: No   Sexual activity: Yes    Birth control/protection: Post-menopausal  Other Topics Concern   Not on file  Social History Narrative   Not on file   Social Determinants of Health   Financial Resource Strain: Not on file  Food Insecurity: Not on file  Transportation Needs: Not on file  Physical Activity: Not on file  Stress: Not on file  Social Connections: Not on file  Intimate Partner Violence: Not on file    Review of Systems: Gen: Denies any fever, chills, cold or flulike symptoms, presyncope, syncope. CV: Denies chest pain, heart palpitations Resp: Denies shortness of breath or cough. GI: See HPI GU : Denies urinary burning, urinary frequency, urinary hesitancy MS: Denies joint pain Derm: Denies rash Psych: Denies depression, anxiety Heme: See HPI  Physical Exam: BP 138/85   Pulse (!) 104   Temp (!) 97.1 F (36.2 C)   Ht 5\' 5"  (1.651 m)   Wt 171 lb 6.4 oz (77.7 kg)   BMI 28.52 kg/m  General:   Elderly female, alert and oriented. Pleasant and cooperative. Well-nourished and well-developed.  Using a roller walker.  Exam completed in chair. Head:  Normocephalic and atraumatic. Eyes:  Without icterus, sclera clear and conjunctiva pink.  Ears:  Normal auditory acuity. Lungs:  Clear to auscultation bilaterally. No wheezes, rales, or rhonchi. No distress.  Heart:  S1, S2 present without murmurs appreciated.  Abdomen:  +BS, soft, non-tender and non-distended. No HSM noted. No guarding or rebound. No masses appreciated.  Somewhat limited due to exam completed in chair. Rectal:  Deferred  Msk:  Symmetrical without gross deformities. Normal posture. Extremities:  Without 1-2+ bilateral LE pitting edema below knees. Neurologic:  Alert and  oriented x4;  grossly normal neurologically. Skin:  Intact without significant lesions or rashes. Psych: Normal mood and affect.    Assessment: 80 year old female with history of CKD,  diabetes, diastolic heart failure, HTN, Parkinson's disease, GERD presenting today at the request of PCP for further evaluation of dysphagia.  Patient presents with her daughter today who helps provide history.  Reports 74-month history of liquid dysphagia with sensation that items are going down the wrong way followed by a lot of coughing.  Some improvement with drinking through a straw.  Denies solid food dysphagia.  Admits to occasional pill dysphagia with large pills.  History of GERD, but this is well controlled on omeprazole 20 mg daily unless eating known trigger foods.  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  Symptoms sound oropharyngeal at this time.  Cannot rule out esophageal component.  We will start with modified barium swallow with barium pill esophagram as well.  Can consider EGD thereafter if needed.  Additionally, patient reports she had a colonoscopy many years ago.  Does not remember the results.  We will request records for review.  She is interested in having with colonoscopy if needed.  Currently without alarm symptoms.  We will discuss this at her follow-up.   Plan: 1.  Refer to SLP for MBSS and BPE. 2.  Continue drinking through a straw and take small sips.  Tuck chin with swallows. 3.  Continue omeprazole 20 mg daily. 4.  Avoid GERD triggers.  Counseled on GERD diet/lifestyle.  Written instructions provided. 5.  Request colonoscopy records from [facility]. 6.  Follow-up after evaluation by SLP.    Aliene Altes, PA-C [facility] 08/22/2020

## 2020-08-22 ENCOUNTER — Encounter (HOSPITAL_COMMUNITY): Payer: Self-pay

## 2020-08-22 ENCOUNTER — Other Ambulatory Visit: Payer: Self-pay

## 2020-08-22 ENCOUNTER — Ambulatory Visit (INDEPENDENT_AMBULATORY_CARE_PROVIDER_SITE_OTHER): Payer: Medicare Other | Admitting: Gastroenterology

## 2020-08-22 ENCOUNTER — Ambulatory Visit (HOSPITAL_COMMUNITY): Payer: Medicare Other

## 2020-08-22 ENCOUNTER — Encounter: Payer: Self-pay | Admitting: Gastroenterology

## 2020-08-22 ENCOUNTER — Telehealth: Payer: Self-pay

## 2020-08-22 VITALS — BP 138/85 | HR 104 | Temp 97.1°F | Ht 65.0 in | Wt 171.4 lb

## 2020-08-22 DIAGNOSIS — M545 Low back pain, unspecified: Secondary | ICD-10-CM | POA: Diagnosis not present

## 2020-08-22 DIAGNOSIS — K219 Gastro-esophageal reflux disease without esophagitis: Secondary | ICD-10-CM

## 2020-08-22 DIAGNOSIS — G8929 Other chronic pain: Secondary | ICD-10-CM

## 2020-08-22 DIAGNOSIS — M6281 Muscle weakness (generalized): Secondary | ICD-10-CM | POA: Diagnosis not present

## 2020-08-22 DIAGNOSIS — R1310 Dysphagia, unspecified: Secondary | ICD-10-CM | POA: Diagnosis not present

## 2020-08-22 DIAGNOSIS — R1312 Dysphagia, oropharyngeal phase: Secondary | ICD-10-CM | POA: Diagnosis not present

## 2020-08-22 DIAGNOSIS — R2689 Other abnormalities of gait and mobility: Secondary | ICD-10-CM

## 2020-08-22 DIAGNOSIS — R1319 Other dysphagia: Secondary | ICD-10-CM | POA: Insufficient documentation

## 2020-08-22 NOTE — Patient Instructions (Addendum)
We are placing a referral to speech-language pathology for you to have a special swallow study to further evaluate the coughing you are experiencing with swallowing liquids.  Continue using a straw and take small sips of liquids.  Tuck your chin down slightly with swallowing liquids.  Continue taking omeprazole 20 mg daily for acid reflux. Avoid your known trigger foods.    Follow a GERD diet:  Avoid fried, fatty, greasy, spicy, citrus foods. Avoid caffeine and carbonated beverages. Avoid chocolate. Try eating 4-6 small meals a day rather than 3 large meals. Do not eat within 3 hours of laying down. Prop head of bed up on wood or bricks to create a 6 inch incline.  We are requesting your colonoscopy records from [facility].  We will follow-up with you in the office after your imaging studies are complete.   Aliene Altes, PA-C [facility]

## 2020-08-22 NOTE — Therapy (Signed)
[place], [place], [address] Phone: [phone]   Fax:  [phone]  Physical Therapy Treatment  Patient Details  Name: [name] MRN: [id number] Date of Birth: 05/27/1940 Referring Provider (PT): Phillips Odor, MD   Encounter Date: 08/22/2020   PT End of Session - 08/22/20 1630     Visit Number 8    Number of Visits 12    Date for PT Re-Evaluation 08/28/20    Authorization Type UHC Medicare, no VL, no auth    Progress Note Due on Visit 10    PT Start Time 1450    PT Stop Time 1533    PT Time Calculation (min) 43 min    Equipment Utilized During Treatment Gait belt    Activity Tolerance Patient limited by fatigue;Patient tolerated treatment well    Behavior During Therapy WFL for tasks assessed/performed             Past Medical History:  Diagnosis Date   CKD (chronic kidney disease)    Diabetes mellitus    Diastolic heart failure ([place])    GERD (gastroesophageal reflux disease)    Hypertension    Parkinson disease ([place])    Scoliosis    Scoliosis    UTI (lower urinary tract infection)     Past Surgical History:  Procedure Laterality Date   boil Left    boil removed from left cheek   EYE SURGERY     Retina Detachment   MASS EXCISION N/A 06/27/2014   Procedure: EXCISION SUBMANDIBULAR STONE;  Surgeon: Izora Gala, MD;  Location: [facility] OR;  Service: ENT;  Laterality: N/A;   sublingual gland removal  2016   tumor removed from rib      There were no vitals filed for this visit.   Subjective Assessment - 08/22/20 1456     Subjective Reports they received compression socks and decreased swelling ankle.  Pain scale 8/10 today    Patient is accompained by: Family member   Daughter [name]   Currently in Pain? Yes    Pain Score 8     Pain Location Back    Pain Orientation Lower    Pain Descriptors / Indicators Aching;Sore    Pain Type Chronic pain    Pain Onset More than a month ago    Pain  Frequency Intermittent    Aggravating Factors  not sure    Pain Relieving Factors pain patches, oil    Effect of Pain on Daily Activities limits                               [facility] Adult PT Treatment/Exercise - 08/22/20 0001       Ambulation/Gait   Ambulation/Gait Yes    Ambulation/Gait Assistance 5: Supervision    Ambulation Distance (Feet) --   through session   Assistive device Straight cane    Gait Pattern Decreased stride length;Trunk flexed    Ambulation Surface Level;Indoor    Gait velocity decreased    Gait Comments cueing for posture and cane placement      Lumbar Exercises: Stretches   Prone on Elbows Stretch Limitations 2 min    Press Ups 5 reps;10 seconds      Lumbar Exercises: Standing   Heel Raises 20 reps   Heavy UE support   Row 20 reps;Theraband    Theraband Level (Row) Level 2 (Red)    Other Standing  Lumbar Exercises sidestepping in // bars 2 RT    Other Standing Lumbar Exercises 4in step up BUE support 5x each      Lumbar Exercises: Seated   Sit to Stand 5 reps   2 sets   Sit to Stand Limitations no HHA, standard height, eccentric control      Lumbar Exercises: Prone   Straight Leg Raise 5 reps    Straight Leg Raises Limitations AAROM knee bent bottom of foot towards ceiling    Other Prone Lumbar Exercises heel squeeze 5x 5"                      PT Short Term Goals - 07/22/20 1513       PT SHORT TERM GOAL #1   Title Patient will be independent with HEP in order to improve functional outcomes.    Time 3    Period Weeks    Status On-going    Target Date 08/07/20      PT SHORT TERM GOAL #2   Title Patient will report at least 25% improvement in symptoms for improved quality of life.    Baseline 8/10 back pain    Time 3    Period Weeks    Status On-going    Target Date 08/07/20      PT SHORT TERM GOAL #3   Title Demo improved BLE strength and balance as evidenced by 20 sec 5xSTS without use of BUE    Baseline  25.7 sec with BUE support required    Time 3    Period Weeks    Status On-going    Target Date 08/07/20               PT Long Term Goals - 07/22/20 1514       PT LONG TERM GOAL #1   Title Patient will report at least 50% improvement in symptoms for improved quality of life.    Baseline 8/10 back pain    Time 6    Period Weeks    Status On-going      PT LONG TERM GOAL #2   Title Demo improved BLE function and balance as evidenced by 5xSTS in 15 sec    Baseline 25.7 sec BUE assist    Time 6    Period Weeks    Status On-going      PT LONG TERM GOAL #3   Title Demo improved gait velocity and tolerance as evidenced by distance of 100 ft during 2MWT    Baseline 42 ft with cane and CGA    Time 6    Period Weeks    Status On-going                   Plan - 08/22/20 1632     Clinical Impression Statement Resumed prone exercises with reports of pain reduced.  Pt continues to require frequent cueing for posture in sitting and standing.  Session focus on postural, gluteal and generalized LE strengthening.  Added step up training with multimodal cueing to improve mechanics as tendency to extend Lt knee prior wb-ing on step.  EOS pt reoprts pain reduced.    Personal Factors and Comorbidities Age;Comorbidity 1;Time since onset of injury/illness/exacerbation;Fitness    Comorbidities PMH    Examination-Activity Limitations Bed Mobility;Carry;Lift;Stand;Stairs;Squat;Locomotion Level;Transfers    Examination-Participation Restrictions Cleaning    Stability/Clinical Decision Making Evolving/Moderate complexity    Clinical Decision Making Moderate    Rehab Potential Good  PT Frequency 2x / week    PT Duration 6 weeks    PT Treatment/Interventions ADLs/Self Care Home Management;Electrical Stimulation;DME Instruction;Gait training;Stair training;Functional mobility training;Therapeutic activities;Therapeutic exercise;Balance training;Patient/family education;Neuromuscular  re-education;Manual techniques    PT Next Visit Plan Reviewed goals next week.  continue strengthening especially of the posture and gluteal mm B. Continue step ups    PT Home Exercise Plan sit tall, scapular retraction, ab set, LAQ, bridge; 08/08/20: POE and bridge as stated she was not doing at home. 8/16 sidestepping at counter    Consulted and Agree with Plan of Care Patient;Family member/caregiver    Family Member Consulted daughter [name]             Patient will benefit from skilled therapeutic intervention in order to improve the following deficits and impairments:  Abnormal gait, Decreased activity tolerance, Decreased balance, Decreased mobility, Decreased endurance, Decreased strength, Difficulty walking, Impaired perceived functional ability, Postural dysfunction, Improper body mechanics, Pain  Visit Diagnosis: Muscle weakness (generalized)  Other abnormalities of gait and mobility  Chronic low back pain, unspecified back pain laterality, unspecified whether sciatica present     Problem List Patient Active Problem List   Diagnosis Date Noted   Dysphagia 08/22/2020   Gastroesophageal reflux disease 08/22/2020   Syncope 10/12/2014   Diabetes ([place]) 10/12/2014   Hypertension 10/12/2014   OSTEOARTHRITIS, 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   TRIGGER FINGER 04/27/2007   Ihor Austin, LPTA/CLT; CBIS [id number]  Aldona Lento 08/22/2020, 4:37 PM  [place] [address] [place], [place], [address] Phone: [phone]   Fax:  [phone]  Name: JACKOLYN GERON MRN: [id number] Date of Birth: Jan 01, 1941

## 2020-08-22 NOTE — Telephone Encounter (Signed)
BPE scheduled for 08/26/20 at 10:00am, arrive at 9:45am. NPO 3 hours prior to test.  Called and informed pt of BPE appt. She gave phone to daughter for me to give her appt also.  Order entered for MBSS and SLP.

## 2020-08-22 NOTE — Progress Notes (Signed)
Requested last colonoscopy and path report from [facility]

## 2020-08-23 ENCOUNTER — Encounter (HOSPITAL_COMMUNITY): Payer: Medicare Other

## 2020-08-23 ENCOUNTER — Other Ambulatory Visit (HOSPITAL_COMMUNITY): Payer: Self-pay | Admitting: Specialist

## 2020-08-23 DIAGNOSIS — R059 Cough, unspecified: Secondary | ICD-10-CM

## 2020-08-23 DIAGNOSIS — R1312 Dysphagia, oropharyngeal phase: Secondary | ICD-10-CM

## 2020-08-26 ENCOUNTER — Ambulatory Visit (HOSPITAL_COMMUNITY)
Admission: RE | Admit: 2020-08-26 | Discharge: 2020-08-26 | Disposition: A | Discharge status: Home / Self Care | Payer: Medicare Other | Source: Ambulatory Visit | Attending: Gastroenterology | Admitting: Gastroenterology

## 2020-08-26 ENCOUNTER — Other Ambulatory Visit: Payer: Self-pay

## 2020-08-26 ENCOUNTER — Other Ambulatory Visit: Payer: Self-pay | Admitting: Gastroenterology

## 2020-08-26 DIAGNOSIS — K224 Dyskinesia of esophagus: Secondary | ICD-10-CM | POA: Diagnosis not present

## 2020-08-26 DIAGNOSIS — R1310 Dysphagia, unspecified: Secondary | ICD-10-CM

## 2020-08-26 DIAGNOSIS — K219 Gastro-esophageal reflux disease without esophagitis: Secondary | ICD-10-CM | POA: Diagnosis not present

## 2020-08-27 ENCOUNTER — Ambulatory Visit (HOSPITAL_COMMUNITY): Payer: Medicare Other | Admitting: Physical Therapy

## 2020-08-27 DIAGNOSIS — M6281 Muscle weakness (generalized): Secondary | ICD-10-CM

## 2020-08-27 DIAGNOSIS — R2689 Other abnormalities of gait and mobility: Secondary | ICD-10-CM

## 2020-08-27 DIAGNOSIS — M545 Low back pain, unspecified: Secondary | ICD-10-CM | POA: Diagnosis not present

## 2020-08-27 DIAGNOSIS — R1312 Dysphagia, oropharyngeal phase: Secondary | ICD-10-CM | POA: Diagnosis not present

## 2020-08-27 DIAGNOSIS — G8929 Other chronic pain: Secondary | ICD-10-CM | POA: Diagnosis not present

## 2020-08-27 NOTE — Therapy (Addendum)
[facility] [address] [place], [place], [address] Phone: [phone]   Fax:  [phone]  Physical Therapy Treatment Progress Note Reporting Period 07/17/2020 to 08/27/2020  See note below for Objective Data and Assessment of Progress/Goals.     Patient Details  Name: [name] MRN: [id number] Date of Birth: 1940/01/13 Referring Provider (PT): Phillips Odor, MD   Encounter Date: 08/27/2020   PT End of Session - 08/27/20 1512     Visit Number 9    Number of Visits 18    Date for PT Re-Evaluation 09/25/20    Authorization Type UHC Medicare, no VL, no auth    Progress Note Due on Visit 52    PT Start Time 1450    PT Stop Time 1532    PT Time Calculation (min) 42 min    Equipment Utilized During Treatment Gait belt    Activity Tolerance Patient limited by fatigue;Patient tolerated treatment well    Behavior During Therapy WFL for tasks assessed/performed             Past Medical History:  Diagnosis Date   CKD (chronic kidney disease)    Diabetes mellitus    Diastolic heart failure ([place])    GERD (gastroesophageal reflux disease)    Hypertension    Parkinson disease ([place])    Scoliosis    Scoliosis    UTI (lower urinary tract infection)     Past Surgical History:  Procedure Laterality Date   boil Left    boil removed from left cheek   EYE SURGERY     Retina Detachment   MASS EXCISION N/A 06/27/2014   Procedure: EXCISION SUBMANDIBULAR STONE;  Surgeon: Izora Gala, MD;  Location: [facility] OR;  Service: ENT;  Laterality: N/A;   sublingual gland removal  2016   tumor removed from rib      There were no vitals filed for this visit.   Subjective Assessment - 08/27/20 1449     Subjective pt states she's loving her compression socks while she has them on but getting them on and off is a chore.  Pt states she feels she is 25% improved.  currently she is having 5/10 pain in her lower back and that is with the use of a pain  patch.  Pt states her pain is constant and never goes away.    Currently in Pain? Yes    Pain Score 5     Pain Location Back    Pain Orientation Lower    Pain Descriptors / Indicators Aching    Pain Type Chronic pain                [facility] PT Assessment - 08/27/20 1506       Assessment   Medical Diagnosis abnormalities of gait and mobility    Referring Provider (PT) Phillips Odor, MD      Observation/Other Assessments-Edema    Edema --   bilateral ankles     Posture/Postural Control   Posture/Postural Control Postural limitations    Postural Limitations Flexed trunk      Strength   Right Hip Flexion 3+/5   was 3+   Right Hip Extension 2+/5   was 2+   Right Hip ABduction 3+/5   was 3/5   Right Hip ADduction 3+/5   was 3+   Left Hip Flexion 3+/5   was 3+   Left Hip Extension 2+/5   was 2+   Left Hip ABduction 3+/5  was 3   Left Hip ADduction 3+/5   was 3+   Right Knee Flexion 4+/5   was 4   Right Knee Extension 4+/5   was 4   Left Knee Flexion 4+/5   was 4   Left Knee Extension 4+/5   was 4   Right Ankle Dorsiflexion 4+/5   was 4   Left Ankle Dorsiflexion 4+/5   was 3+     Palpation   Spinal mobility scoliosis evident with right hip higher than left      Transfers   Transfers Sit to Stand;Stand Pivot Transfers    Sit to Stand 6: Modified independent (Device/Increase time)    Five time sit to stand comments  20 seconds (was 25.7 sec with BUE assist)      Ambulation/Gait   Ambulation/Gait Yes    Ambulation/Gait Assistance 5: Supervision    Ambulation Distance (Feet) 114 Feet    Assistive device Rolling walker    Gait Pattern Decreased stride length;Trunk flexed    Ambulation Surface Level;Indoor    Gait velocity decreased    Gait Comments 2 minute walk test 114 feet with RW                           [facility] Adult PT Treatment/Exercise - 08/27/20 0001       Ambulation/Gait   Ambulation/Gait Yes    Ambulation/Gait Assistance 5: Supervision     Ambulation Distance (Feet) 114 Feet    Assistive device Rolling walker    Gait Pattern Decreased stride length;Trunk flexed    Ambulation Surface Level;Indoor    Gait velocity decreased    Gait Comments 2 minute walk test                      PT Short Term Goals - 08/27/20 1554       PT SHORT TERM GOAL #1   Title Patient will be independent with HEP in order to improve functional outcomes.    Time 3    Period Weeks    Status Partially Met    Target Date 08/07/20      PT SHORT TERM GOAL #2   Title Patient will report at least 25% improvement in symptoms for improved quality of life.    Baseline 8/10 back pain    Time 3    Period Weeks    Status Achieved    Target Date 08/07/20      PT SHORT TERM GOAL #3   Title Demo improved BLE strength and balance as evidenced by 20 sec 5xSTS without use of BUE    Baseline 25.7 sec with BUE support required, 20 sec with bil UE support on 8/23    Time 3    Period Weeks    Status On-going    Target Date 08/07/20               PT Long Term Goals - 08/27/20 1610       PT LONG TERM GOAL #3   Title Demo improved gait velocity and tolerance as evidenced by distance of 100 ft during 2MWT                   Plan - 08/27/20 1559     Clinical Impression Statement Objective measures retested this session for 10th visit progress note/recertification.  Pt has met 1 short and 1 long term goal since beginning therapy.  Pt was  able to increase her distance on her 2MWT to 114 feet using a RW.  Improvement in bil LE and dorsiflexion strength via manual muscle testing.  Pt remains unsteady with use of SPC and presents wiht continued hip weakness.  Pt would continue to benefit from continued therapy working on remaining deficits.    Personal Factors and Comorbidities Age;Comorbidity 1;Time since onset of injury/illness/exacerbation;Fitness    Comorbidities PMH    Examination-Activity Limitations Bed  Mobility;Carry;Lift;Stand;Stairs;Squat;Locomotion Level;Transfers    Examination-Participation Restrictions Cleaning    Stability/Clinical Decision Making Evolving/Moderate complexity    Rehab Potential Good    PT Frequency 2x / week    PT Duration 4 weeks    PT Treatment/Interventions ADLs/Self Care Home Management;Electrical Stimulation;DME Instruction;Gait training;Stair training;Functional mobility training;Therapeutic activities;Therapeutic exercise;Balance training;Patient/family education;Neuromuscular re-education;Manual techniques    PT Next Visit Plan Continue strengthening of bilateral hip mm, LE stability and gait progression/independence.    PT Home Exercise Plan sit tall, scapular retraction, ab set, LAQ, bridge; 08/08/20: POE and bridge as stated she was not doing at home. 8/16 sidestepping at counter    Consulted and Agree with Plan of Care Patient;Family member/caregiver    Family Member Consulted daughter [name]             Patient will benefit from skilled therapeutic intervention in order to improve the following deficits and impairments:  Abnormal gait, Decreased activity tolerance, Decreased balance, Decreased mobility, Decreased endurance, Decreased strength, Difficulty walking, Impaired perceived functional ability, Postural dysfunction, Improper body mechanics, Pain  Visit Diagnosis: Muscle weakness (generalized)  Other abnormalities of gait and mobility  Chronic low back pain, unspecified back pain laterality, unspecified whether sciatica present     Problem List Patient Active Problem List   Diagnosis Date Noted   Dysphagia 08/22/2020   Gastroesophageal reflux disease 08/22/2020   Syncope 10/12/2014   Diabetes ([place]) 10/12/2014   Hypertension 10/12/2014   OSTEOARTHRITIS, 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

## 2020-08-27 NOTE — Addendum Note (Signed)
Addended by: Leeroy Cha on: 08/27/2020 04:34 PM   Modules accepted: Orders

## 2020-08-29 ENCOUNTER — Other Ambulatory Visit: Payer: Self-pay

## 2020-08-29 ENCOUNTER — Encounter (HOSPITAL_COMMUNITY): Payer: Self-pay

## 2020-08-29 ENCOUNTER — Ambulatory Visit (HOSPITAL_COMMUNITY): Payer: Medicare Other

## 2020-08-29 DIAGNOSIS — R2689 Other abnormalities of gait and mobility: Secondary | ICD-10-CM | POA: Diagnosis not present

## 2020-08-29 DIAGNOSIS — G8929 Other chronic pain: Secondary | ICD-10-CM

## 2020-08-29 DIAGNOSIS — M545 Low back pain, unspecified: Secondary | ICD-10-CM | POA: Diagnosis not present

## 2020-08-29 DIAGNOSIS — M6281 Muscle weakness (generalized): Secondary | ICD-10-CM

## 2020-08-29 DIAGNOSIS — R1312 Dysphagia, oropharyngeal phase: Secondary | ICD-10-CM | POA: Diagnosis not present

## 2020-08-29 NOTE — Therapy (Signed)
[facility] [place], [place], [address] Phone: [phone]   Fax:  [phone]  Physical Therapy Treatment  Patient Details  Name: [name] MRN: [id number] Date of Birth: 1940-01-14 Referring Provider (PT): Phillips Odor, MD   Encounter Date: 08/29/2020   PT End of Session - 08/29/20 1505     Visit Number 10    Number of Visits 18    Date for PT Re-Evaluation 09/25/20    Authorization Type UHC Medicare, no VL, no auth    Progress Note Due on Visit 19    PT Start Time 1448    PT Stop Time 1533    PT Time Calculation (min) 45 min    Equipment Utilized During Treatment Gait belt    Activity Tolerance Patient limited by fatigue;Patient tolerated treatment well    Behavior During Therapy WFL for tasks assessed/performed             Past Medical History:  Diagnosis Date   CKD (chronic kidney disease)    Diabetes mellitus    Diastolic heart failure ([place])    GERD (gastroesophageal reflux disease)    Hypertension    Parkinson disease ([place])    Scoliosis    Scoliosis    UTI (lower urinary tract infection)     Past Surgical History:  Procedure Laterality Date   boil Left    boil removed from left cheek   EYE SURGERY     Retina Detachment   MASS EXCISION N/A 06/27/2014   Procedure: EXCISION SUBMANDIBULAR STONE;  Surgeon: Izora Gala, MD;  Location: [facility] OR;  Service: ENT;  Laterality: N/A;   sublingual gland removal  2016   tumor removed from rib      There were no vitals filed for this visit.   Subjective Assessment - 08/29/20 1459     Subjective Pt stated LBP pain scale 10/10 though does note feel need for ER.  Reports she feels pain relief with prone exercises though doesn't do it much at home.    Patient is accompained by: Family member   daughter [name]   Currently in Pain? Yes    Pain Score 10-Worst pain ever    Pain Location Back    Pain Orientation Lower    Pain Descriptors / Indicators Aching     Pain Type Chronic pain    Pain Onset More than a month ago    Pain Frequency Intermittent    Aggravating Factors  not sure    Pain Relieving Factors pain patches, oil    Effect of Pain on Daily Activities limits                               [facility] Adult PT Treatment/Exercise - 08/29/20 0001       Lumbar Exercises: Standing   Heel Raises 10 reps   limited range, heavy UE support   Row 20 reps;Theraband    Theraband Level (Row) Level 2 (Red)    Other Standing Lumbar Exercises Gait training inside // bars to improve heel strike, retro gait, sidestep 2RT      Lumbar Exercises: Seated   Sit to Stand 5 reps   52   Sit to Stand Limitations 2 sets, no HHA, stardard height    Other Seated Lumbar Exercises RTB 2x 10 cueing for ab set and posture    Other Seated Lumbar Exercises heel/toe raises  Lumbar Exercises: Supine   Bridge 10 reps    Bridge Limitations 2 sets      Lumbar Exercises: Prone   Other Prone Lumbar Exercises heel squeeze 10x with tactile cueing for                      PT Short Term Goals - 08/27/20 1554       PT SHORT TERM GOAL #1   Title Patient will be independent with HEP in order to improve functional outcomes.    Time 3    Period Weeks    Status Partially Met    Target Date 08/07/20      PT SHORT TERM GOAL #2   Title Patient will report at least 25% improvement in symptoms for improved quality of life.    Baseline 8/10 back pain    Time 3    Period Weeks    Status Achieved    Target Date 08/07/20      PT SHORT TERM GOAL #3   Title Demo improved BLE strength and balance as evidenced by 20 sec 5xSTS without use of BUE    Baseline 25.7 sec with BUE support required, 20 sec with bil UE support on 8/23    Time 3    Period Weeks    Status On-going    Target Date 08/07/20               PT Long Term Goals - 08/27/20 1610       PT LONG TERM GOAL #3   Title Demo improved gait velocity and tolerance as evidenced  by distance of 100 ft during 2MWT                   Plan - 08/29/20 1741     Clinical Impression Statement Session focus with gluteal strengthening and posture for pain control.  Pt reports vast improvements with pain reduction following prone based exercises, given HEP printout to complete at home.  Pt ambulates with flat foot, gait training to improve heel to toe mechanics to improve mechanics.  Added forward/retro and continued sidestepping for gluteal and balance training wiht cueing through session for posture and to reduce ER during sidestep.  No reports of pain at EOS.    Personal Factors and Comorbidities Age;Comorbidity 1;Time since onset of injury/illness/exacerbation;Fitness    Comorbidities PMH    Examination-Activity Limitations Bed Mobility;Carry;Lift;Stand;Stairs;Squat;Locomotion Level;Transfers    Examination-Participation Restrictions Cleaning    Stability/Clinical Decision Making Evolving/Moderate complexity    Clinical Decision Making Moderate    Rehab Potential Good    PT Frequency 2x / week    PT Duration 4 weeks    PT Treatment/Interventions ADLs/Self Care Home Management;Electrical Stimulation;DME Instruction;Gait training;Stair training;Functional mobility training;Therapeutic activities;Therapeutic exercise;Balance training;Patient/family education;Neuromuscular re-education;Manual techniques    PT Next Visit Plan Continue strengthening of bilateral hip mm, LE stability and gait progression/independence.    PT Home Exercise Plan sit tall, scapular retraction, ab set, LAQ, bridge; 08/08/20: POE and bridge as stated she was not doing at home. 8/16 sidestepping at counter; 8/25: POE, heel squeeze, bridge and STS    Consulted and Agree with Plan of Care Patient;Family member/caregiver    Family Member Consulted daughter [name]             Patient will benefit from skilled therapeutic intervention in order to improve the following deficits and impairments:   Abnormal gait, Decreased activity tolerance, Decreased balance, Decreased mobility, Decreased endurance, Decreased strength, Difficulty  walking, Impaired perceived functional ability, Postural dysfunction, Improper body mechanics, Pain  Visit Diagnosis: Muscle weakness (generalized)  Other abnormalities of gait and mobility  Chronic low back pain, unspecified back pain laterality, unspecified whether sciatica present     Problem List Patient Active Problem List   Diagnosis Date Noted   Dysphagia 08/22/2020   Gastroesophageal reflux disease 08/22/2020   Syncope 10/12/2014   Diabetes ([place]) 10/12/2014   Hypertension 10/12/2014   OSTEOARTHRITIS, 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   TRIGGER FINGER 04/27/2007   Ihor Austin, LPTA/CLT; CBIS [phone]  Aldona Lento 08/29/2020, 5:47 PM  [facility] [address] [place], [place], [address] Phone: [phone]   Fax:  [phone]  Name: [name] MRN: [id number] Date of Birth: [birth year]

## 2020-09-02 ENCOUNTER — Ambulatory Visit (HOSPITAL_COMMUNITY): Payer: Medicare Other | Admitting: Physical Therapy

## 2020-09-02 ENCOUNTER — Other Ambulatory Visit: Payer: Self-pay

## 2020-09-02 DIAGNOSIS — M545 Low back pain, unspecified: Secondary | ICD-10-CM

## 2020-09-02 DIAGNOSIS — M6281 Muscle weakness (generalized): Secondary | ICD-10-CM | POA: Diagnosis not present

## 2020-09-02 DIAGNOSIS — R1312 Dysphagia, oropharyngeal phase: Secondary | ICD-10-CM | POA: Diagnosis not present

## 2020-09-02 DIAGNOSIS — G8929 Other chronic pain: Secondary | ICD-10-CM | POA: Diagnosis not present

## 2020-09-02 DIAGNOSIS — R2689 Other abnormalities of gait and mobility: Secondary | ICD-10-CM | POA: Diagnosis not present

## 2020-09-02 NOTE — Therapy (Signed)
[facility] [place], [place], [address] Phone: [phone]   Fax:  [phone]  Physical Therapy Treatment  Patient Details  Name: [name] MRN: [id number] Date of Birth: 1940/01/14 Referring Provider (PT): Phillips Odor, MD   Encounter Date: 09/02/2020   PT End of Session - 09/02/20 1457     Visit Number 11    Number of Visits 18    Date for PT Re-Evaluation 09/25/20    Authorization Type UHC Medicare, no VL, no auth    Progress Note Due on Visit 19    PT Start Time 1449    PT Stop Time 1530    PT Time Calculation (min) 41 min    Equipment Utilized During Treatment Gait belt    Activity Tolerance Patient limited by fatigue;Patient tolerated treatment well    Behavior During Therapy WFL for tasks assessed/performed             Past Medical History:  Diagnosis Date   CKD (chronic kidney disease)    Diabetes mellitus    Diastolic heart failure ([place])    GERD (gastroesophageal reflux disease)    Hypertension    Parkinson disease ([place])    Scoliosis    Scoliosis    UTI (lower urinary tract infection)     Past Surgical History:  Procedure Laterality Date   boil Left    boil removed from left cheek   EYE SURGERY     Retina Detachment   MASS EXCISION N/A 06/27/2014   Procedure: EXCISION SUBMANDIBULAR STONE;  Surgeon: Izora Gala, MD;  Location: [facility] OR;  Service: ENT;  Laterality: N/A;   sublingual gland removal  2016   tumor removed from rib      There were no vitals filed for this visit.   Subjective Assessment - 09/02/20 1457     Subjective Patient reports her pain is 10/10 and last time she had more than 10. States that her scoliosis is bothering her.    Patient is accompained by: Family member   daughter [name]   Currently in Pain? Yes    Pain Score 10-Worst pain ever    Pain Location Back    Pain Orientation Lower    Pain Descriptors / Indicators Aching    Pain Type Chronic pain    Pain Onset  More than a month ago                [facility] PT Assessment - 09/02/20 0001       Assessment   Medical Diagnosis abnormalities of gait and mobility    Referring Provider (PT) Phillips Odor, MD                           [facility] Adult PT Treatment/Exercise - 09/02/20 0001       Ambulation/Gait   Ambulation/Gait Yes    Ambulation/Gait Assistance 5: Supervision    Assistive device Rolling walker    Gait Pattern Decreased stride length;Trunk flexed    Ambulation Surface Level;Indoor    Gait Comments cues to stand tall, relax right shoulder and to not drag feet - 10 minutes total      Lumbar Exercises: Aerobic   Nustep Level 1 4 minutes      Lumbar Exercises: Seated   Other Seated Lumbar Exercises side bending PT assisted x25 B but with longer stretch on left secondary to scoliosis - with core activation to return to upright  Lumbar Exercises: Supine   Other Supine Lumbar Exercises kickouts with tall posure and visual cues and verbal cues to keep from  leaning - 10 minutes total with rest breaks as needed - heat on low back during exercise      Modalities   Modalities Moist Heat      Moist Heat Therapy   Number Minutes Moist Heat 15 Minutes    Moist Heat Location Lumbar Spine                      PT Short Term Goals - 08/27/20 1554       PT SHORT TERM GOAL #1   Title Patient will be independent with HEP in order to improve functional outcomes.    Time 3    Period Weeks    Status Partially Met    Target Date 08/07/20      PT SHORT TERM GOAL #2   Title Patient will report at least 25% improvement in symptoms for improved quality of life.    Baseline 8/10 back pain    Time 3    Period Weeks    Status Achieved    Target Date 08/07/20      PT SHORT TERM GOAL #3   Title Demo improved BLE strength and balance as evidenced by 20 sec 5xSTS without use of BUE    Baseline 25.7 sec with BUE support required, 20 sec with bil UE support on  8/23    Time 3    Period Weeks    Status On-going    Target Date 08/07/20               PT Long Term Goals - 08/27/20 1610       PT LONG TERM GOAL #3   Title Demo improved gait velocity and tolerance as evidenced by distance of 100 ft during 2MWT                   Plan - 09/02/20 1531     Clinical Impression Statement Patient with pain across ow back today. Patient reported reduced symptoms with heat. Focused on seated exercises secondary to pain and focus on core strengthening and posture. Patient educated on overall posture and scoliosis and how she currently sits. Patient continues to drag left foot with walking, this improved with verbal cues to listen to her foot dragging. Patient reported reduced symptoms end of session.    Personal Factors and Comorbidities Age;Comorbidity 1;Time since onset of injury/illness/exacerbation;Fitness    Comorbidities PMH    Examination-Activity Limitations Bed Mobility;Carry;Lift;Stand;Stairs;Squat;Locomotion Level;Transfers    Examination-Participation Restrictions Cleaning    Stability/Clinical Decision Making Evolving/Moderate complexity    Rehab Potential Good    PT Frequency 2x / week    PT Duration 4 weeks    PT Treatment/Interventions ADLs/Self Care Home Management;Electrical Stimulation;DME Instruction;Gait training;Stair training;Functional mobility training;Therapeutic activities;Therapeutic exercise;Balance training;Patient/family education;Neuromuscular re-education;Manual techniques    PT Next Visit Plan Continue strengthening of bilateral hip mm, LE stability and gait progression/independence.    PT Home Exercise Plan sit tall, scapular retraction, ab set, LAQ, bridge; 08/08/20: POE and bridge as stated she was not doing at home. 8/16 sidestepping at counter; 8/25: POE, heel squeeze, bridge and STS    Consulted and Agree with Plan of Care Patient;Family member/caregiver    Family Member Consulted daughter [name]              Patient will benefit from skilled therapeutic intervention in order to  improve the following deficits and impairments:  Abnormal gait, Decreased activity tolerance, Decreased balance, Decreased mobility, Decreased endurance, Decreased strength, Difficulty walking, Impaired perceived functional ability, Postural dysfunction, Improper body mechanics, Pain  Visit Diagnosis: Muscle weakness (generalized)  Other abnormalities of gait and mobility  Chronic low back pain, unspecified back pain laterality, unspecified whether sciatica present     Problem List Patient Active Problem List   Diagnosis Date Noted   Dysphagia 08/22/2020   Gastroesophageal reflux disease 08/22/2020   Syncope 10/12/2014   Diabetes ([place]) 10/12/2014   Hypertension 10/12/2014   OSTEOARTHRITIS, 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 [place], [place], [address] Phone: [phone]   Fax:  [phone]  Name: NADALIE LAUGHNER MRN: [id number] Date of Birth: [birth year]

## 2020-09-04 ENCOUNTER — Encounter (HOSPITAL_COMMUNITY): Payer: Self-pay | Admitting: Speech Pathology

## 2020-09-04 ENCOUNTER — Other Ambulatory Visit: Payer: Self-pay

## 2020-09-04 ENCOUNTER — Ambulatory Visit (HOSPITAL_COMMUNITY)
Admission: RE | Admit: 2020-09-04 | Discharge: 2020-09-04 | Disposition: A | Discharge status: Home / Self Care | Payer: Medicare Other | Source: Ambulatory Visit | Attending: Gastroenterology | Admitting: Gastroenterology

## 2020-09-04 ENCOUNTER — Ambulatory Visit (HOSPITAL_COMMUNITY): Payer: Medicare Other | Admitting: Speech Pathology

## 2020-09-04 DIAGNOSIS — R1312 Dysphagia, oropharyngeal phase: Secondary | ICD-10-CM | POA: Diagnosis not present

## 2020-09-04 DIAGNOSIS — R059 Cough, unspecified: Secondary | ICD-10-CM | POA: Diagnosis not present

## 2020-09-04 DIAGNOSIS — R2689 Other abnormalities of gait and mobility: Secondary | ICD-10-CM | POA: Diagnosis not present

## 2020-09-04 DIAGNOSIS — G8929 Other chronic pain: Secondary | ICD-10-CM | POA: Diagnosis not present

## 2020-09-04 DIAGNOSIS — N183 Chronic kidney disease, stage 3 unspecified: Secondary | ICD-10-CM | POA: Diagnosis not present

## 2020-09-04 DIAGNOSIS — E11649 Type 2 diabetes mellitus with hypoglycemia without coma: Secondary | ICD-10-CM | POA: Diagnosis not present

## 2020-09-04 DIAGNOSIS — I129 Hypertensive chronic kidney disease with stage 1 through stage 4 chronic kidney disease, or unspecified chronic kidney disease: Secondary | ICD-10-CM | POA: Diagnosis not present

## 2020-09-04 DIAGNOSIS — M6281 Muscle weakness (generalized): Secondary | ICD-10-CM | POA: Diagnosis not present

## 2020-09-04 DIAGNOSIS — M130 Polyarthritis, unspecified: Secondary | ICD-10-CM | POA: Diagnosis not present

## 2020-09-04 DIAGNOSIS — M545 Low back pain, unspecified: Secondary | ICD-10-CM | POA: Diagnosis not present

## 2020-09-04 NOTE — Therapy (Signed)
[name], [place], [address] Phone: [phone]   Fax:  [phone]  Modified Barium Swallow  Patient Details  Name: [name] MRN: [id number] Date of Birth: 01-Jul-1940 No data recorded  Encounter Date: 09/04/2020    Past Medical History:  Past Medical History:  Diagnosis Date   CKD (chronic kidney disease)    Diabetes mellitus    Diastolic heart failure ([place])    GERD (gastroesophageal reflux disease)    Hypertension    Parkinson disease ([place])    Scoliosis    Scoliosis    UTI (lower urinary tract infection)    Past Surgical History:  Past Surgical History:  Procedure Laterality Date   boil Left    boil removed from left cheek   EYE SURGERY     Retina Detachment   MASS EXCISION N/A 06/27/2014   Procedure: EXCISION SUBMANDIBULAR STONE;  Surgeon: Izora Gala, MD;  Location: [facility];  Service: ENT;  Laterality: N/A;   sublingual gland removal  2016   tumor removed from rib     HPI: [name] is a 80 y.o. female with medical hx significant for GERD, parkinsons disease and Pt had a barium pill esophagram on file from 2021 reporting age-related esophageal dysmotility, large hiatal hernia with 50% of the stomach above the diaphragm, minimal laryngeal penetration without aspiration. Pt reported to GI at recent appointment on 08/22/20 that she gets "strangled on liquids. She feels liquids go down the wrong way followed by coughing" however this has reportedly gotten better over the past couple of weeks. She also reports difficulty with large pills. MBS ordered to objectively assess the swallowing function.   No data recorded   Assessment / Plan / Recommendation  CHL IP CLINICAL IMPRESSIONS 09/04/2020  Clinical Impression Pt presents with swllowing function to be [facility] today on assessment, however, note some lingual pumping and decreased bolus cohesion with solid textures and occasional flash penetration of thin  liquids (with all presenations tsp, cup & straw) without aspiration. Overall note good hyolaryngeal excursion, good laryngeal vestibule closure and adequate laryngeal squeeze. Recommend continue with current diet (Pt reports currently consumes a soft diet because she has misplaced her lower dentures) and thin liquids. Meds are ok whole with liquids; Pt reports difficulty with larger pills recommend break large pills in half or crush if needed and able to do so; also recommend trying with puree if needed. Secondary to known esophageal dysphagia, SLP reinforced esophageal precautions and reviewed with Pt and Pt's daughter. There are no ST f/u needs at this time, thank you for this referral  SLP Visit Diagnosis Dysphagia, unspecified (R13.10)  Attention and concentration deficit following --  Frontal lobe and executive function deficit following --  Impact on safety and function --      CHL IP TREATMENT RECOMMENDATION 09/04/2020  Treatment Recommendations No treatment recommended at this time     No flowsheet data found.  CHL IP DIET RECOMMENDATION 09/04/2020  SLP Diet Recommendations Regular solids;Thin liquid  Liquid Administration via Cup;Straw  Medication Administration Whole meds with liquid  Compensations Minimize environmental distractions;Slow rate;Small sips/bites  Postural Changes Seated upright at 90 degrees      CHL IP OTHER RECOMMENDATIONS 09/04/2020  Recommended Consults --  Oral Care Recommendations Oral care BID  Other Recommendations --      No flowsheet data found.    No flowsheet data found.         CHL IP ORAL  PHASE 09/04/2020  Oral Phase Impaired  Oral - Pudding Teaspoon --  Oral - Pudding Cup --  Oral - Honey Teaspoon --  Oral - Honey Cup --  Oral - Nectar Teaspoon --  Oral - Nectar Cup --  Oral - Nectar Straw --  Oral - Thin Teaspoon WFL  Oral - Thin Cup WFL  Oral - Thin Straw WFL  Oral - Puree Lingual pumping;Decreased bolus cohesion  Oral - Mech Soft NT   Oral - Regular Lingual pumping;Decreased bolus cohesion  Oral - Multi-Consistency NT  Oral - Pill WFL  Oral Phase - Comment --    CHL IP PHARYNGEAL PHASE 09/04/2020  Pharyngeal Phase Impaired  Pharyngeal- Pudding Teaspoon --  Pharyngeal --  Pharyngeal- Pudding Cup --  Pharyngeal --  Pharyngeal- Honey Teaspoon --  Pharyngeal --  Pharyngeal- Honey Cup --  Pharyngeal --  Pharyngeal- Nectar Teaspoon --  Pharyngeal --  Pharyngeal- Nectar Cup --  Pharyngeal --  Pharyngeal- Nectar Straw --  Pharyngeal --  Pharyngeal- Thin Teaspoon Penetration/Aspiration during swallow  Pharyngeal Material enters airway, remains ABOVE vocal cords then ejected out  Pharyngeal- Thin Cup Penetration/Aspiration during swallow  Pharyngeal Material enters airway, remains ABOVE vocal cords then ejected out  Pharyngeal- Thin Straw Penetration/Aspiration during swallow  Pharyngeal Material enters airway, remains ABOVE vocal cords then ejected out  Pharyngeal- Puree --  Pharyngeal --  Pharyngeal- Mechanical Soft --  Pharyngeal --  Pharyngeal- Regular --  Pharyngeal --  Pharyngeal- Multi-consistency --  Pharyngeal --  Pharyngeal- Pill --  Pharyngeal --  Pharyngeal Comment --     CHL IP CERVICAL ESOPHAGEAL PHASE 09/04/2020  Cervical Esophageal Phase WFL  Pudding Teaspoon --  Pudding Cup --  Honey Teaspoon --  Honey Cup --  Nectar Teaspoon --  Nectar Cup --  Nectar Straw --  Thin Teaspoon --  Thin Cup --  Thin Straw --  Puree --  Mechanical Soft --  Regular --  Multi-consistency --  Pill --  Cervical Esophageal Comment --   [name] [name], CCC-SLP Speech Language Pathologist   Wende Bushy 09/04/2020, 2:48 PM

## 2020-09-05 ENCOUNTER — Ambulatory Visit (HOSPITAL_COMMUNITY): Payer: Medicare Other | Attending: Gastroenterology | Admitting: Physical Therapy

## 2020-09-05 DIAGNOSIS — G8929 Other chronic pain: Secondary | ICD-10-CM | POA: Insufficient documentation

## 2020-09-05 DIAGNOSIS — M545 Low back pain, unspecified: Secondary | ICD-10-CM | POA: Insufficient documentation

## 2020-09-05 DIAGNOSIS — R2689 Other abnormalities of gait and mobility: Secondary | ICD-10-CM | POA: Diagnosis not present

## 2020-09-05 DIAGNOSIS — M6281 Muscle weakness (generalized): Secondary | ICD-10-CM | POA: Insufficient documentation

## 2020-09-05 NOTE — Therapy (Signed)
[place], [place], [address] Phone: [phone]   Fax:  [phone]  Physical Therapy Treatment  Patient Details  Name: [name] MRN: [id number] Date of Birth: [birth year] Referring Provider (PT): Phillips Odor, MD   Encounter Date: 09/05/2020   PT End of Session - 09/05/20 1505     Visit Number 12    Number of Visits 18    Date for PT Re-Evaluation 09/25/20    Authorization Type UHC Medicare, no VL, no auth    Progress Note Due on Visit 19    PT Start Time 1410    PT Stop Time 1445    PT Time Calculation (min) 35 min    Equipment Utilized During Treatment Gait belt    Activity Tolerance Patient limited by fatigue;Patient tolerated treatment well    Behavior During Therapy WFL for tasks assessed/performed             Past Medical History:  Diagnosis Date   CKD (chronic kidney disease)    Diabetes mellitus    Diastolic heart failure ([place])    GERD (gastroesophageal reflux disease)    Hypertension    Parkinson disease ([place])    Scoliosis    Scoliosis    UTI (lower urinary tract infection)     Past Surgical History:  Procedure Laterality Date   boil Left    boil removed from left cheek   EYE SURGERY     Retina Detachment   MASS EXCISION N/A 06/27/2014   Procedure: EXCISION SUBMANDIBULAR STONE;  Surgeon: Izora Gala, MD;  Location: [facility] OR;  Service: ENT;  Laterality: N/A;   sublingual gland removal  2016   tumor removed from rib      There were no vitals filed for this visit.   Subjective Assessment - 09/05/20 1432     Subjective pt states shes about the same.  States she's been using a heating pad at home and it is also helping.  Currently 8/10.    Currently in Pain? Yes    Pain Score 8     Pain Location Back    Pain Orientation Lower    Pain Descriptors / Indicators Aching    Pain Type Chronic pain                               [facility] Adult PT Treatment/Exercise -  09/05/20 0001       Lumbar Exercises: Standing   Other Standing Lumbar Exercises Gait training inside // bars to improve heel strike, retro gait, sidestep 2RT      Lumbar Exercises: Seated   Sit to Stand 5 reps    Other Seated Lumbar Exercises RTB 2x 10 cueing for ab set and posture      Lumbar Exercises: Supine   Ab Set 10 reps    AB Set Limitations 5 second holds    Bridge 10 reps    Bridge Limitations 2 sets      Modalities   Modalities Moist Heat      Moist Heat Therapy   Number Minutes Moist Heat 10 Minutes    Moist Heat Location Lumbar Spine                    PT Education - 09/05/20 1445     Education Details postural education; importance in pushing self and doing her exercises.    Person(s) Educated  Patient    Methods Explanation    Comprehension Verbalized understanding              PT Short Term Goals - 08/27/20 1554       PT SHORT TERM GOAL #1   Title Patient will be independent with HEP in order to improve functional outcomes.    Time 3    Period Weeks    Status Partially Met    Target Date 08/07/20      PT SHORT TERM GOAL #2   Title Patient will report at least 25% improvement in symptoms for improved quality of life.    Baseline 8/10 back pain    Time 3    Period Weeks    Status Achieved    Target Date 08/07/20      PT SHORT TERM GOAL #3   Title Demo improved BLE strength and balance as evidenced by 20 sec 5xSTS without use of BUE    Baseline 25.7 sec with BUE support required, 20 sec with bil UE support on 8/23    Time 3    Period Weeks    Status On-going    Target Date 08/07/20               PT Long Term Goals - 08/27/20 1610       PT LONG TERM GOAL #3   Title Demo improved gait velocity and tolerance as evidenced by distance of 100 ft during 2MWT                   Plan - 09/05/20 1505     Clinical Impression Statement Pt was late for appointment and then requested to use the bathroom once got into  treatment room.  Session limited today by available treatment time.  completed postural theraband exercises in sitting and mat exercises in supine while lying on moist heat.  pt requires cues with all exercises to complete slowly and controlled.  Pt tends to rush through exercises and displays poor eccentric control.   No pain voiced during session.  cues for posturing and staying within walker BOS with ambulation.    Personal Factors and Comorbidities Age;Comorbidity 1;Time since onset of injury/illness/exacerbation;Fitness    Comorbidities PMH    Examination-Activity Limitations Bed Mobility;Carry;Lift;Stand;Stairs;Squat;Locomotion Level;Transfers    Examination-Participation Restrictions Cleaning    Stability/Clinical Decision Making Evolving/Moderate complexity    Rehab Potential Good    PT Frequency 2x / week    PT Duration 4 weeks    PT Treatment/Interventions ADLs/Self Care Home Management;Electrical Stimulation;DME Instruction;Gait training;Stair training;Functional mobility training;Therapeutic activities;Therapeutic exercise;Balance training;Patient/family education;Neuromuscular re-education;Manual techniques    PT Next Visit Plan Continue strengthening of bilateral hip mm, LE stability and gait progression/independence.    PT Home Exercise Plan sit tall, scapular retraction, ab set, LAQ, bridge; 08/08/20: POE and bridge as stated she was not doing at home. 8/16 sidestepping at counter; 8/25: POE, heel squeeze, bridge and STS    Consulted and Agree with Plan of Care Patient;Family member/caregiver    Family Member Consulted daughter [name]             Patient will benefit from skilled therapeutic intervention in order to improve the following deficits and impairments:  Abnormal gait, Decreased activity tolerance, Decreased balance, Decreased mobility, Decreased endurance, Decreased strength, Difficulty walking, Impaired perceived functional ability, Postural dysfunction, Improper body  mechanics, Pain  Visit Diagnosis: Muscle weakness (generalized)  Other abnormalities of gait and mobility  Chronic low back pain, unspecified back  pain laterality, unspecified whether sciatica present     Problem List Patient Active Problem List   Diagnosis Date Noted   Dysphagia 08/22/2020   Gastroesophageal reflux disease 08/22/2020   Syncope 10/12/2014   Diabetes ([place]) 10/12/2014   Hypertension 10/12/2014   OSTEOARTHRITIS, 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   TRIGGER FINGER 04/27/2007   Teena Irani, PTA/CLT [phone]  Teena Irani 09/05/2020, 3:08 PM  [facility] [address] [place], [place], [address] Phone: [phone]   Fax:  [phone]  Name: DIAMONDS LIPPARD MRN: [id number] Date of Birth: [birth year]

## 2020-09-06 DIAGNOSIS — R809 Proteinuria, unspecified: Secondary | ICD-10-CM | POA: Diagnosis not present

## 2020-09-06 DIAGNOSIS — E1129 Type 2 diabetes mellitus with other diabetic kidney complication: Secondary | ICD-10-CM | POA: Diagnosis not present

## 2020-09-06 DIAGNOSIS — I129 Hypertensive chronic kidney disease with stage 1 through stage 4 chronic kidney disease, or unspecified chronic kidney disease: Secondary | ICD-10-CM | POA: Diagnosis not present

## 2020-09-06 DIAGNOSIS — N189 Chronic kidney disease, unspecified: Secondary | ICD-10-CM | POA: Diagnosis not present

## 2020-09-06 DIAGNOSIS — E1122 Type 2 diabetes mellitus with diabetic chronic kidney disease: Secondary | ICD-10-CM | POA: Diagnosis not present

## 2020-09-11 ENCOUNTER — Ambulatory Visit (HOSPITAL_COMMUNITY): Payer: Medicare Other | Admitting: Physical Therapy

## 2020-09-11 ENCOUNTER — Encounter: Payer: Self-pay | Admitting: Internal Medicine

## 2020-09-11 ENCOUNTER — Other Ambulatory Visit: Payer: Self-pay

## 2020-09-11 ENCOUNTER — Telehealth: Payer: Self-pay | Admitting: Gastroenterology

## 2020-09-11 DIAGNOSIS — M545 Low back pain, unspecified: Secondary | ICD-10-CM | POA: Diagnosis not present

## 2020-09-11 DIAGNOSIS — M6281 Muscle weakness (generalized): Secondary | ICD-10-CM

## 2020-09-11 DIAGNOSIS — G8929 Other chronic pain: Secondary | ICD-10-CM | POA: Diagnosis not present

## 2020-09-11 DIAGNOSIS — R2689 Other abnormalities of gait and mobility: Secondary | ICD-10-CM

## 2020-09-11 NOTE — Telephone Encounter (Signed)
Received notification that no colonoscopy records could be found in the [facility].  No additional recommendations at this time.  We will follow-up with patient as scheduled.

## 2020-09-11 NOTE — Therapy (Signed)
[place], [place], [address] Phone: [phone]   Fax:  [phone]  Physical Therapy Treatment  Patient Details  Name: [name] MRN: [id number] Date of Birth: 08/14/1940 Referring Provider (PT): Phillips Odor, MD   Encounter Date: 09/11/2020   PT End of Session - 09/11/20 1659     Visit Number 13    Number of Visits 18    Date for PT Re-Evaluation 09/25/20    Authorization Type UHC Medicare, no VL, no auth    Progress Note Due on Visit 21    PT Start Time 1530    PT Stop Time 1615    PT Time Calculation (min) 45 min    Equipment Utilized During Treatment Gait belt    Activity Tolerance Patient limited by fatigue;Patient tolerated treatment well    Behavior During Therapy WFL for tasks assessed/performed             Past Medical History:  Diagnosis Date   CKD (chronic kidney disease)    Diabetes mellitus    Diastolic heart failure ([place])    GERD (gastroesophageal reflux disease)    Hypertension    Parkinson disease ([place])    Scoliosis    Scoliosis    UTI (lower urinary tract infection)     Past Surgical History:  Procedure Laterality Date   boil Left    boil removed from left cheek   EYE SURGERY     Retina Detachment   MASS EXCISION N/A 06/27/2014   Procedure: EXCISION SUBMANDIBULAR STONE;  Surgeon: Izora Gala, MD;  Location: [facility] OR;  Service: ENT;  Laterality: N/A;   sublingual gland removal  2016   tumor removed from rib      There were no vitals filed for this visit.   Subjective Assessment - 09/11/20 1543     Subjective Pt reports 8/10 pain but states it is better than last time.    Currently in Pain? Yes    Pain Score 8     Pain Location Back    Pain Orientation Lower    Pain Descriptors / Indicators Aching    Pain Type Chronic pain                               [facility] Adult PT Treatment/Exercise - 09/11/20 0001       Ambulation/Gait   Ambulation/Gait  Yes    Ambulation/Gait Assistance 5: Supervision    Ambulation Distance (Feet) 200 Feet   100x2 BOUTS   Assistive device Rolling walker    Gait Pattern Decreased stride length;Trunk flexed    Ambulation Surface Level;Indoor    Gait Comments cues to stand tall, relax right shoulder and to not drag feet - 10 minutes total      Lumbar Exercises: Aerobic   Nustep Level 2 5 minutes      Lumbar Exercises: Standing   Row 20 reps;Theraband    Theraband Level (Row) Level 2 (Red)      Lumbar Exercises: Seated   Sit to Stand 5 reps      Lumbar Exercises: Supine   Ab Set 15 reps    AB Set Limitations 5 second holds    Bridge 10 reps    Bridge Limitations 2 sets                  Upper Extremity Functional Index Score :   /80  PT Short Term Goals - 08/27/20 1554       PT SHORT TERM GOAL #1   Title Patient will be independent with HEP in order to improve functional outcomes.    Time 3    Period Weeks    Status Partially Met    Target Date 08/07/20      PT SHORT TERM GOAL #2   Title Patient will report at least 25% improvement in symptoms for improved quality of life.    Baseline 8/10 back pain    Time 3    Period Weeks    Status Achieved    Target Date 08/07/20      PT SHORT TERM GOAL #3   Title Demo improved BLE strength and balance as evidenced by 20 sec 5xSTS without use of BUE    Baseline 25.7 sec with BUE support required, 20 sec with bil UE support on 8/23    Time 3    Period Weeks    Status On-going    Target Date 08/07/20               PT Long Term Goals - 08/27/20 1610       PT LONG TERM GOAL #3   Title Demo improved gait velocity and tolerance as evidenced by distance of 100 ft during 2MWT                   Plan - 09/11/20 1654     Clinical Impression Statement Pt still witih lumbar pain but states the heat is helping.  completed mat exercise while lying on moist heat and progressed to gait using RW.  Focused on upright  posturing, forward gaze and staying within walker BOS.  Pt had to use restroroom midway through session.  Improved eccentric control today with sitting activity this session.  Reported fatigue at end of session.    Personal Factors and Comorbidities Age;Comorbidity 1;Time since onset of injury/illness/exacerbation;Fitness    Comorbidities PMH    Examination-Activity Limitations Bed Mobility;Carry;Lift;Stand;Stairs;Squat;Locomotion Level;Transfers    Examination-Participation Restrictions Cleaning    Stability/Clinical Decision Making Evolving/Moderate complexity    Rehab Potential Good    PT Frequency 2x / week    PT Duration 4 weeks    PT Treatment/Interventions ADLs/Self Care Home Management;Electrical Stimulation;DME Instruction;Gait training;Stair training;Functional mobility training;Therapeutic activities;Therapeutic exercise;Balance training;Patient/family education;Neuromuscular re-education;Manual techniques    PT Next Visit Plan Continue strengthening of bilateral hip mm, LE stability and gait progression/independence.    PT Home Exercise Plan sit tall, scapular retraction, ab set, LAQ, bridge; 08/08/20: POE and bridge as stated she was not doing at home. 8/16 sidestepping at counter; 8/25: POE, heel squeeze, bridge and STS    Consulted and Agree with Plan of Care Patient;Family member/caregiver    Family Member Consulted daughter [name]             Patient will benefit from skilled therapeutic intervention in order to improve the following deficits and impairments:  Abnormal gait, Decreased activity tolerance, Decreased balance, Decreased mobility, Decreased endurance, Decreased strength, Difficulty walking, Impaired perceived functional ability, Postural dysfunction, Improper body mechanics, Pain  Visit Diagnosis: Muscle weakness (generalized)  Other abnormalities of gait and mobility  Chronic low back pain, unspecified back pain laterality, unspecified whether sciatica  present     Problem List Patient Active Problem List   Diagnosis Date Noted   Dysphagia 08/22/2020   Gastroesophageal reflux disease 08/22/2020   Syncope 10/12/2014   Diabetes ([place]) 10/12/2014   Hypertension 10/12/2014  OSTEOARTHRITIS, 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   TRIGGER FINGER 04/27/2007    Teena Irani, PTA 09/11/2020, 5:00 PM  [facility] [address] [place], [place], [address] Phone: [phone]   Fax:  [phone]  Name: DENELDA AKERLEY MRN: [id number] Date of Birth: Feb 16, 1940

## 2020-09-16 ENCOUNTER — Other Ambulatory Visit: Payer: Self-pay

## 2020-09-16 ENCOUNTER — Ambulatory Visit (HOSPITAL_COMMUNITY): Payer: Medicare Other | Admitting: Physical Therapy

## 2020-09-16 ENCOUNTER — Encounter (HOSPITAL_COMMUNITY): Payer: Self-pay | Admitting: Physical Therapy

## 2020-09-16 DIAGNOSIS — R2689 Other abnormalities of gait and mobility: Secondary | ICD-10-CM | POA: Diagnosis not present

## 2020-09-16 DIAGNOSIS — M545 Low back pain, unspecified: Secondary | ICD-10-CM | POA: Diagnosis not present

## 2020-09-16 DIAGNOSIS — G8929 Other chronic pain: Secondary | ICD-10-CM

## 2020-09-16 DIAGNOSIS — M6281 Muscle weakness (generalized): Secondary | ICD-10-CM | POA: Diagnosis not present

## 2020-09-16 NOTE — Therapy (Signed)
[place], [place], [address] Phone: [phone]   Fax:  [phone]  Physical Therapy Treatment  Patient Details  Name: [name] MRN: [id number] Date of Birth: 29-Apr-1940 Referring Provider (PT): Phillips Odor, MD   Encounter Date: 09/16/2020   PT End of Session - 09/16/20 1459     Visit Number 14    Number of Visits 18    Date for PT Re-Evaluation 09/25/20    Authorization Type UHC Medicare, no VL, no auth    Progress Note Due on Visit 62    PT Start Time 1459   arrives late   PT Stop Time 1525    PT Time Calculation (min) 26 min    Equipment Utilized During Treatment Gait belt    Activity Tolerance Patient limited by fatigue;Patient tolerated treatment well    Behavior During Therapy WFL for tasks assessed/performed             Past Medical History:  Diagnosis Date   CKD (chronic kidney disease)    Diabetes mellitus    Diastolic heart failure ([place])    GERD (gastroesophageal reflux disease)    Hypertension    Parkinson disease ([place])    Scoliosis    Scoliosis    UTI (lower urinary tract infection)     Past Surgical History:  Procedure Laterality Date   boil Left    boil removed from left cheek   EYE SURGERY     Retina Detachment   MASS EXCISION N/A 06/27/2014   Procedure: EXCISION SUBMANDIBULAR STONE;  Surgeon: Izora Gala, MD;  Location: [facility] OR;  Service: ENT;  Laterality: N/A;   sublingual gland removal  2016   tumor removed from rib      There were no vitals filed for this visit.   Subjective Assessment - 09/16/20 1501     Subjective her back is really painful today.    Pain Score 9     Pain Location Back    Pain Orientation Lower    Pain Descriptors / Indicators Aching    Pain Type Chronic pain    Pain Onset More than a month ago    Pain Frequency Intermittent                               [facility] Adult PT Treatment/Exercise - 09/16/20 0001        Ambulation/Gait   Ambulation/Gait Yes    Ambulation/Gait Assistance 5: Supervision    Ambulation Distance (Feet) 200 Feet    Assistive device Rolling walker    Gait Pattern Decreased stride length;Trunk flexed    Ambulation Surface Level;Indoor    Gait Comments 2x 25, 1x 50, 1x 100      Lumbar Exercises: Aerobic   Nustep Level 2 5 minutes      Lumbar Exercises: Standing   Row 20 reps;Theraband    Theraband Level (Row) Level 2 (Red)    Other Standing Lumbar Exercises marching 1x 10 bilateral                     PT Education - 09/16/20 1501     Education Details HEP    Person(s) Educated Patient    Methods Explanation    Comprehension Verbalized understanding              PT Short Term Goals - 08/27/20 1554  PT SHORT TERM GOAL #1   Title Patient will be independent with HEP in order to improve functional outcomes.    Time 3    Period Weeks    Status Partially Met    Target Date 08/07/20      PT SHORT TERM GOAL #2   Title Patient will report at least 25% improvement in symptoms for improved quality of life.    Baseline 8/10 back pain    Time 3    Period Weeks    Status Achieved    Target Date 08/07/20      PT SHORT TERM GOAL #3   Title Demo improved BLE strength and balance as evidenced by 20 sec 5xSTS without use of BUE    Baseline 25.7 sec with BUE support required, 20 sec with bil UE support on 8/23    Time 3    Period Weeks    Status On-going    Target Date 08/07/20               PT Long Term Goals - 08/27/20 1610       PT LONG TERM GOAL #3   Title Demo improved gait velocity and tolerance as evidenced by distance of 100 ft during 2MWT                   Plan - 09/16/20 1500     Clinical Impression Statement Session limited by patient's late arrival. Patient also limited by c/o back pain. Patient requires seated rest break halfway between waiting room and nu step secondary to pain and fatigue. Patient also required a  rest room break during session.  Continued with postural and hip strengthening. Patient will continue to benefit from skilled physical therapy in order to reduce impairment and improve function.    Personal Factors and Comorbidities Age;Comorbidity 1;Time since onset of injury/illness/exacerbation;Fitness    Comorbidities PMH    Examination-Activity Limitations Bed Mobility;Carry;Lift;Stand;Stairs;Squat;Locomotion Level;Transfers    Examination-Participation Restrictions Cleaning    Stability/Clinical Decision Making Evolving/Moderate complexity    Rehab Potential Good    PT Frequency 2x / week    PT Duration 4 weeks    PT Treatment/Interventions ADLs/Self Care Home Management;Electrical Stimulation;DME Instruction;Gait training;Stair training;Functional mobility training;Therapeutic activities;Therapeutic exercise;Balance training;Patient/family education;Neuromuscular re-education;Manual techniques    PT Next Visit Plan Continue strengthening of bilateral hip mm, LE stability and gait progression/independence.    PT Home Exercise Plan sit tall, scapular retraction, ab set, LAQ, bridge; 08/08/20: POE and bridge as stated she was not doing at home. 8/16 sidestepping at counter; 8/25: POE, heel squeeze, bridge and STS    Consulted and Agree with Plan of Care Patient;Family member/caregiver    Family Member Consulted daughter [name]             Patient will benefit from skilled therapeutic intervention in order to improve the following deficits and impairments:  Abnormal gait, Decreased activity tolerance, Decreased balance, Decreased mobility, Decreased endurance, Decreased strength, Difficulty walking, Impaired perceived functional ability, Postural dysfunction, Improper body mechanics, Pain  Visit Diagnosis: Muscle weakness (generalized)  Other abnormalities of gait and mobility  Chronic low back pain, unspecified back pain laterality, unspecified whether sciatica present     Problem  List Patient Active Problem List   Diagnosis Date Noted   Dysphagia 08/22/2020   Gastroesophageal reflux disease 08/22/2020   Syncope 10/12/2014   Diabetes ([place]) 10/12/2014   Hypertension 10/12/2014   OSTEOARTHRITIS, 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   TRIGGER FINGER 04/27/2007   3:30 PM, 09/16/20  [name] PT, DPT Physical Therapist at [place], [place], [address] Phone: [phone]   Fax:  [phone]  Name: RAIGEN JAGIELSKI MRN: [id number] Date of Birth: [birth year]

## 2020-09-17 DIAGNOSIS — M79671 Pain in right foot: Secondary | ICD-10-CM | POA: Diagnosis not present

## 2020-09-17 DIAGNOSIS — M79674 Pain in right toe(s): Secondary | ICD-10-CM | POA: Diagnosis not present

## 2020-09-17 DIAGNOSIS — M79672 Pain in left foot: Secondary | ICD-10-CM | POA: Diagnosis not present

## 2020-09-17 DIAGNOSIS — I129 Hypertensive chronic kidney disease with stage 1 through stage 4 chronic kidney disease, or unspecified chronic kidney disease: Secondary | ICD-10-CM | POA: Diagnosis not present

## 2020-09-17 DIAGNOSIS — E1129 Type 2 diabetes mellitus with other diabetic kidney complication: Secondary | ICD-10-CM | POA: Diagnosis not present

## 2020-09-17 DIAGNOSIS — I739 Peripheral vascular disease, unspecified: Secondary | ICD-10-CM | POA: Diagnosis not present

## 2020-09-17 DIAGNOSIS — E559 Vitamin D deficiency, unspecified: Secondary | ICD-10-CM | POA: Diagnosis not present

## 2020-09-17 DIAGNOSIS — I5032 Chronic diastolic (congestive) heart failure: Secondary | ICD-10-CM | POA: Diagnosis not present

## 2020-09-17 DIAGNOSIS — E1122 Type 2 diabetes mellitus with diabetic chronic kidney disease: Secondary | ICD-10-CM | POA: Diagnosis not present

## 2020-09-17 DIAGNOSIS — N189 Chronic kidney disease, unspecified: Secondary | ICD-10-CM | POA: Diagnosis not present

## 2020-09-17 DIAGNOSIS — E1140 Type 2 diabetes mellitus with diabetic neuropathy, unspecified: Secondary | ICD-10-CM | POA: Diagnosis not present

## 2020-09-17 DIAGNOSIS — R809 Proteinuria, unspecified: Secondary | ICD-10-CM | POA: Diagnosis not present

## 2020-09-17 DIAGNOSIS — M79675 Pain in left toe(s): Secondary | ICD-10-CM | POA: Diagnosis not present

## 2020-09-17 DIAGNOSIS — E211 Secondary hyperparathyroidism, not elsewhere classified: Secondary | ICD-10-CM | POA: Diagnosis not present

## 2020-09-18 ENCOUNTER — Encounter (HOSPITAL_COMMUNITY): Payer: Self-pay

## 2020-09-18 ENCOUNTER — Other Ambulatory Visit: Payer: Self-pay

## 2020-09-18 ENCOUNTER — Ambulatory Visit (HOSPITAL_COMMUNITY): Payer: Medicare Other

## 2020-09-18 DIAGNOSIS — M6281 Muscle weakness (generalized): Secondary | ICD-10-CM | POA: Diagnosis not present

## 2020-09-18 DIAGNOSIS — R2689 Other abnormalities of gait and mobility: Secondary | ICD-10-CM | POA: Diagnosis not present

## 2020-09-18 DIAGNOSIS — M545 Low back pain, unspecified: Secondary | ICD-10-CM | POA: Diagnosis not present

## 2020-09-18 DIAGNOSIS — G8929 Other chronic pain: Secondary | ICD-10-CM | POA: Diagnosis not present

## 2020-09-18 NOTE — Therapy (Signed)
[place], [place], [address] Phone: [phone]   Fax:  [phone]  Physical Therapy Treatment  Patient Details  Name: [name] MRN: [id number] Date of Birth: [birth year] Referring Provider (PT): Phillips Odor, MD   Encounter Date: 09/18/2020   PT End of Session - 09/18/20 1426     Visit Number 15    Number of Visits 18    Date for PT Re-Evaluation 09/25/20    Authorization Type UHC Medicare, no VL, no auth    Progress Note Due on Visit 19    PT Start Time 1408   late arrival   PT Stop Time 1453    PT Time Calculation (min) 45 min    Equipment Utilized During Treatment Gait belt    Activity Tolerance Patient limited by pain;Patient limited by fatigue;No increased pain;Patient tolerated treatment well    Behavior During Therapy WFL for tasks assessed/performed             Past Medical History:  Diagnosis Date   CKD (chronic kidney disease)    Diabetes mellitus    Diastolic heart failure ([facility])    GERD (gastroesophageal reflux disease)    Hypertension    Parkinson disease ([place])    Scoliosis    Scoliosis    UTI (lower urinary tract infection)     Past Surgical History:  Procedure Laterality Date   boil Left    boil removed from left cheek   EYE SURGERY     Retina Detachment   MASS EXCISION N/A 06/27/2014   Procedure: EXCISION SUBMANDIBULAR STONE;  Surgeon: Izora Gala, MD;  Location: [facility] OR;  Service: ENT;  Laterality: N/A;   sublingual gland removal  2016   tumor removed from rib      There were no vitals filed for this visit.   Subjective Assessment - 09/18/20 1425     Subjective Pt stated her back is painful today, reports some relief when stands tall but admits easier to bend forward and give in.  Reports MHP helped with pain.    Currently in Pain? Yes    Pain Score 9     Pain Location Back    Pain Orientation Lower    Pain Descriptors / Indicators Aching    Pain Type Chronic pain     Pain Onset More than a month ago    Pain Frequency Intermittent    Aggravating Factors  not sure    Pain Relieving Factors pain patches, oil    Effect of Pain on Daily Activities limits                               [facility] Adult PT Treatment/Exercise - 09/18/20 0001       Ambulation/Gait   Ambulation/Gait Yes    Ambulation/Gait Assistance 5: Supervision    Ambulation Distance (Feet) 200 Feet    Assistive device Rolling walker    Gait Pattern Decreased stride length;Trunk flexed    Ambulation Surface Level;Indoor    Gait Comments 12 min with 1 seated rest break      Lumbar Exercises: Stretches   Prone on Elbows Stretch Limitations 2 min with MHP      Lumbar Exercises: Seated   Sit to Stand 5 reps    Sit to Stand Limitations no HHA, eccentric control      Lumbar Exercises: Supine   Bridge 10 reps;Limitations  Bridge Limitations 10" holds; completed with MHP      Lumbar Exercises: Prone   Other Prone Lumbar Exercises heel squeeze 10x with tactile cueing for      Modalities   Modalities Moist Heat      Moist Heat Therapy   Number Minutes Moist Heat --   during prone and supine exercises   Moist Heat Location Lumbar Spine                       PT Short Term Goals - 08/27/20 1554       PT SHORT TERM GOAL #1   Title Patient will be independent with HEP in order to improve functional outcomes.    Time 3    Period Weeks    Status Partially Met    Target Date 08/07/20      PT SHORT TERM GOAL #2   Title Patient will report at least 25% improvement in symptoms for improved quality of life.    Baseline 8/10 back pain    Time 3    Period Weeks    Status Achieved    Target Date 08/07/20      PT SHORT TERM GOAL #3   Title Demo improved BLE strength and balance as evidenced by 20 sec 5xSTS without use of BUE    Baseline 25.7 sec with BUE support required, 20 sec with bil UE support on 8/23    Time 3    Period Weeks    Status  On-going    Target Date 08/07/20               PT Long Term Goals - 08/27/20 1610       PT LONG TERM GOAL #3   Title Demo improved gait velocity and tolerance as evidenced by distance of 100 ft during 2MWT                   Plan - 09/18/20 1434     Clinical Impression Statement Session limited by patient's late arrival and c/o back pain.  Took 12 minutes to complete 232f during gait with 1 seated rest break.  Began therex with mat activities included MHP for pain control.  Continued with postural and hip strengthening.  Resumed prone exercises to improve lumbar extension for pain control and gluteal strengthening.  Pt able to present with improved clearance during bridges and 10" holds that was tolerated well.  Increased time to complete all exercises this session.  Reintergrated importance of posture for pain control.    Personal Factors and Comorbidities Age;Comorbidity 1;Time since onset of injury/illness/exacerbation;Fitness    Comorbidities PMH    Examination-Activity Limitations Bed Mobility;Carry;Lift;Stand;Stairs;Squat;Locomotion Level;Transfers    Examination-Participation Restrictions Cleaning    Stability/Clinical Decision Making Evolving/Moderate complexity    Clinical Decision Making Moderate    Rehab Potential Good    PT Frequency 2x / week    PT Duration 4 weeks    PT Treatment/Interventions ADLs/Self Care Home Management;Electrical Stimulation;DME Instruction;Gait training;Stair training;Functional mobility training;Therapeutic activities;Therapeutic exercise;Balance training;Patient/family education;Neuromuscular re-education;Manual techniques    PT Next Visit Plan Continue strengthening of bilateral hip mm, LE stability and gait progression/independence.    PT Home Exercise Plan sit tall, scapular retraction, ab set, LAQ, bridge; 08/08/20: POE and bridge as stated she was not doing at home. 8/16 sidestepping at counter; 8/25: POE, heel squeeze, bridge and STS     Consulted and Agree with Plan of Care Patient;Family member/caregiver    Family  Member Consulted daughter [name]             Patient will benefit from skilled therapeutic intervention in order to improve the following deficits and impairments:  Abnormal gait, Decreased activity tolerance, Decreased balance, Decreased mobility, Decreased endurance, Decreased strength, Difficulty walking, Impaired perceived functional ability, Postural dysfunction, Improper body mechanics, Pain  Visit Diagnosis: Muscle weakness (generalized)  Other abnormalities of gait and mobility  Chronic low back pain, unspecified back pain laterality, unspecified whether sciatica present     Problem List Patient Active Problem List   Diagnosis Date Noted   Dysphagia 08/22/2020   Gastroesophageal reflux disease 08/22/2020   Syncope 10/12/2014   Diabetes ([place]) 10/12/2014   Hypertension 10/12/2014   OSTEOARTHRITIS, 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   TRIGGER FINGER 04/27/2007   Ihor Austin, LPTA/CLT; CBIS [phone]  Aldona Lento, PTA 09/18/2020, 3:04 PM  [facility] [address] [place], [place], [address] Phone: [phone]   Fax:  [phone]  Name: [name] MRN: [id number] Date of Birth: [birth year]

## 2020-09-23 ENCOUNTER — Encounter (HOSPITAL_COMMUNITY): Payer: Medicare Other | Admitting: Physical Therapy

## 2020-09-25 ENCOUNTER — Other Ambulatory Visit: Payer: Self-pay

## 2020-09-25 ENCOUNTER — Ambulatory Visit (HOSPITAL_COMMUNITY): Payer: Medicare Other | Admitting: Physical Therapy

## 2020-09-25 DIAGNOSIS — M6281 Muscle weakness (generalized): Secondary | ICD-10-CM | POA: Diagnosis not present

## 2020-09-25 DIAGNOSIS — G8929 Other chronic pain: Secondary | ICD-10-CM

## 2020-09-25 DIAGNOSIS — R2689 Other abnormalities of gait and mobility: Secondary | ICD-10-CM | POA: Diagnosis not present

## 2020-09-25 DIAGNOSIS — M545 Low back pain, unspecified: Secondary | ICD-10-CM | POA: Diagnosis not present

## 2020-09-25 NOTE — Therapy (Addendum)
[place], [place], [address] Phone: [phone]   Fax:  [phone]  Physical Therapy Treatment and D/C Summary  Patient Details  Name: [name] MRN: [id number] Date of Birth: 03-06-1940 Referring Provider (PT): Phillips Odor, MD  PHYSICAL THERAPY DISCHARGE SUMMARY  Visits from Start of Care: 16  Current functional level related to goals / functional outcomes: Pt able to meet majority of STG/LTG and demo improved functional status via objective mesurements   Remaining deficits: none   Education / Equipment: HEP   Patient agrees to discharge. Patient goals were partially met. Patient is being discharged due to being pleased with the current functional level.   Encounter Date: 09/25/2020     Past Medical History:  Diagnosis Date   CKD (chronic kidney disease)    Diabetes mellitus    Diastolic heart failure ([facility])    GERD (gastroesophageal reflux disease)    Hypertension    Parkinson disease ([place])    Scoliosis    Scoliosis    UTI (lower urinary tract infection)     Past Surgical History:  Procedure Laterality Date   boil Left    boil removed from left cheek   EYE SURGERY     Retina Detachment   MASS EXCISION N/A 06/27/2014   Procedure: EXCISION SUBMANDIBULAR STONE;  Surgeon: Izora Gala, MD;  Location: [facility] OR;  Service: ENT;  Laterality: N/A;   sublingual gland removal  2016   tumor removed from rib      There were no vitals filed for this visit.                                    PT Short Term Goals - 09/25/20 1607       PT SHORT TERM GOAL #1   Title Patient will be independent with HEP in order to improve functional outcomes.    Time 3    Period Weeks    Status Achieved    Target Date 08/07/20      PT SHORT TERM GOAL #2   Title Patient will report at least 25% improvement in symptoms for improved quality of life.    Baseline 8/10 back pain    Time  3    Period Weeks    Status Achieved    Target Date 08/07/20      PT SHORT TERM GOAL #3   Title Demo improved BLE strength and balance as evidenced by 20 sec 5xSTS without use of BUE    Baseline 25.7 sec with BUE support required, 20 sec with bil UE support on 8/23    Time 3    Period Weeks    Status Achieved    Target Date 08/07/20               PT Long Term Goals - 09/25/20 1608       PT LONG TERM GOAL #1   Title Patient will report at least 50% improvement in symptoms for improved quality of life.    Baseline 8/10 back pain    Time 6    Period Weeks    Status Achieved      PT LONG TERM GOAL #2   Title Demo improved BLE function and balance as evidenced by 5xSTS in 15 sec    Baseline 25.7 sec BUE assist    Time 6    Period Weeks  Status Not Met      PT LONG TERM GOAL #3   Title Demo improved gait velocity and tolerance as evidenced by distance of 100 ft during 2MWT    Baseline 42 ft with cane and CGA    Time 6    Period Weeks    Status Achieved                     Patient will benefit from skilled therapeutic intervention in order to improve the following deficits and impairments:  Abnormal gait, Decreased activity tolerance, Decreased balance, Decreased mobility, Decreased endurance, Decreased strength, Difficulty walking, Impaired perceived functional ability, Postural dysfunction, Improper body mechanics, Pain  Visit Diagnosis: Muscle weakness (generalized)  Other abnormalities of gait and mobility  Chronic low back pain, unspecified back pain laterality, unspecified whether sciatica present     Problem List Patient Active Problem List   Diagnosis Date Noted   Dysphagia 08/22/2020   Gastroesophageal reflux disease 08/22/2020   Syncope 10/12/2014   Diabetes ([place]) 10/12/2014   Hypertension 10/12/2014   OSTEOARTHRITIS, 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   TRIGGER FINGER 04/27/2007   Teena Irani, PTA/CLT [phone]  Toniann Fail,  PT 10/01/2020, 10:40 AM  10:40 AM, 10/01/20 M. Sherlyn Lees, PT, DPT Physical Therapist- [place] Office Number: [phone]  [facility] [address] [place], [place], [address] Phone: [phone]   Fax:  [phone]  Name: [name] MRN: [id number] Date of Birth: 03/30/1940

## 2020-10-01 ENCOUNTER — Ambulatory Visit (HOSPITAL_COMMUNITY): Payer: Medicare Other | Admitting: Physical Therapy

## 2020-10-03 ENCOUNTER — Encounter (HOSPITAL_COMMUNITY): Payer: Medicare Other | Admitting: Physical Therapy

## 2020-10-28 DIAGNOSIS — I10 Essential (primary) hypertension: Secondary | ICD-10-CM | POA: Diagnosis not present

## 2020-10-28 DIAGNOSIS — M1830 Unilateral post-traumatic osteoarthritis of first carpometacarpal joint, unspecified hand: Secondary | ICD-10-CM | POA: Diagnosis not present

## 2020-10-28 DIAGNOSIS — R112 Nausea with vomiting, unspecified: Secondary | ICD-10-CM | POA: Diagnosis not present

## 2020-10-28 DIAGNOSIS — E1122 Type 2 diabetes mellitus with diabetic chronic kidney disease: Secondary | ICD-10-CM | POA: Diagnosis not present

## 2020-10-28 DIAGNOSIS — Z23 Encounter for immunization: Secondary | ICD-10-CM | POA: Diagnosis not present

## 2020-10-28 DIAGNOSIS — K219 Gastro-esophageal reflux disease without esophagitis: Secondary | ICD-10-CM | POA: Diagnosis not present

## 2020-10-28 DIAGNOSIS — N183 Chronic kidney disease, stage 3 unspecified: Secondary | ICD-10-CM | POA: Diagnosis not present

## 2020-10-28 DIAGNOSIS — E1165 Type 2 diabetes mellitus with hyperglycemia: Secondary | ICD-10-CM | POA: Diagnosis not present

## 2020-10-28 DIAGNOSIS — K449 Diaphragmatic hernia without obstruction or gangrene: Secondary | ICD-10-CM | POA: Diagnosis not present

## 2020-11-04 DIAGNOSIS — M130 Polyarthritis, unspecified: Secondary | ICD-10-CM | POA: Diagnosis not present

## 2020-11-04 DIAGNOSIS — E11649 Type 2 diabetes mellitus with hypoglycemia without coma: Secondary | ICD-10-CM | POA: Diagnosis not present

## 2020-11-04 DIAGNOSIS — N183 Chronic kidney disease, stage 3 unspecified: Secondary | ICD-10-CM | POA: Diagnosis not present

## 2020-11-04 DIAGNOSIS — I129 Hypertensive chronic kidney disease with stage 1 through stage 4 chronic kidney disease, or unspecified chronic kidney disease: Secondary | ICD-10-CM | POA: Diagnosis not present

## 2020-11-21 DIAGNOSIS — E559 Vitamin D deficiency, unspecified: Secondary | ICD-10-CM | POA: Diagnosis not present

## 2020-11-21 DIAGNOSIS — E1129 Type 2 diabetes mellitus with other diabetic kidney complication: Secondary | ICD-10-CM | POA: Diagnosis not present

## 2020-11-21 DIAGNOSIS — E211 Secondary hyperparathyroidism, not elsewhere classified: Secondary | ICD-10-CM | POA: Diagnosis not present

## 2020-11-21 DIAGNOSIS — N189 Chronic kidney disease, unspecified: Secondary | ICD-10-CM | POA: Diagnosis not present

## 2020-11-21 DIAGNOSIS — E1122 Type 2 diabetes mellitus with diabetic chronic kidney disease: Secondary | ICD-10-CM | POA: Diagnosis not present

## 2020-11-25 DIAGNOSIS — E1122 Type 2 diabetes mellitus with diabetic chronic kidney disease: Secondary | ICD-10-CM | POA: Diagnosis not present

## 2020-11-25 DIAGNOSIS — R140 Abdominal distension (gaseous): Secondary | ICD-10-CM | POA: Diagnosis not present

## 2020-11-25 DIAGNOSIS — I129 Hypertensive chronic kidney disease with stage 1 through stage 4 chronic kidney disease, or unspecified chronic kidney disease: Secondary | ICD-10-CM | POA: Diagnosis not present

## 2020-11-25 DIAGNOSIS — M4135 Thoracogenic scoliosis, thoracolumbar region: Secondary | ICD-10-CM | POA: Diagnosis not present

## 2020-11-25 DIAGNOSIS — Z0000 Encounter for general adult medical examination without abnormal findings: Secondary | ICD-10-CM | POA: Diagnosis not present

## 2020-11-25 DIAGNOSIS — Z0189 Encounter for other specified special examinations: Secondary | ICD-10-CM | POA: Diagnosis not present

## 2020-11-25 DIAGNOSIS — N184 Chronic kidney disease, stage 4 (severe): Secondary | ICD-10-CM | POA: Diagnosis not present

## 2020-11-25 DIAGNOSIS — M150 Primary generalized (osteo)arthritis: Secondary | ICD-10-CM | POA: Diagnosis not present

## 2020-11-27 DIAGNOSIS — I129 Hypertensive chronic kidney disease with stage 1 through stage 4 chronic kidney disease, or unspecified chronic kidney disease: Secondary | ICD-10-CM | POA: Diagnosis not present

## 2020-11-27 DIAGNOSIS — R809 Proteinuria, unspecified: Secondary | ICD-10-CM | POA: Diagnosis not present

## 2020-11-27 DIAGNOSIS — E1129 Type 2 diabetes mellitus with other diabetic kidney complication: Secondary | ICD-10-CM | POA: Diagnosis not present

## 2020-11-27 DIAGNOSIS — E1122 Type 2 diabetes mellitus with diabetic chronic kidney disease: Secondary | ICD-10-CM | POA: Diagnosis not present

## 2020-11-27 DIAGNOSIS — I5032 Chronic diastolic (congestive) heart failure: Secondary | ICD-10-CM | POA: Diagnosis not present

## 2020-11-27 DIAGNOSIS — N189 Chronic kidney disease, unspecified: Secondary | ICD-10-CM | POA: Diagnosis not present

## 2020-11-27 DIAGNOSIS — E871 Hypo-osmolality and hyponatremia: Secondary | ICD-10-CM | POA: Diagnosis not present

## 2020-12-04 DIAGNOSIS — I129 Hypertensive chronic kidney disease with stage 1 through stage 4 chronic kidney disease, or unspecified chronic kidney disease: Secondary | ICD-10-CM | POA: Diagnosis not present

## 2020-12-04 DIAGNOSIS — N183 Chronic kidney disease, stage 3 unspecified: Secondary | ICD-10-CM | POA: Diagnosis not present

## 2020-12-04 DIAGNOSIS — E11649 Type 2 diabetes mellitus with hypoglycemia without coma: Secondary | ICD-10-CM | POA: Diagnosis not present

## 2020-12-17 DIAGNOSIS — M79675 Pain in left toe(s): Secondary | ICD-10-CM | POA: Diagnosis not present

## 2020-12-17 DIAGNOSIS — M79671 Pain in right foot: Secondary | ICD-10-CM | POA: Diagnosis not present

## 2020-12-17 DIAGNOSIS — M79672 Pain in left foot: Secondary | ICD-10-CM | POA: Diagnosis not present

## 2020-12-17 DIAGNOSIS — M79674 Pain in right toe(s): Secondary | ICD-10-CM | POA: Diagnosis not present

## 2020-12-17 DIAGNOSIS — E1140 Type 2 diabetes mellitus with diabetic neuropathy, unspecified: Secondary | ICD-10-CM | POA: Diagnosis not present

## 2020-12-17 DIAGNOSIS — I739 Peripheral vascular disease, unspecified: Secondary | ICD-10-CM | POA: Diagnosis not present

## 2020-12-19 DIAGNOSIS — H2512 Age-related nuclear cataract, left eye: Secondary | ICD-10-CM | POA: Diagnosis not present

## 2020-12-19 DIAGNOSIS — E113553 Type 2 diabetes mellitus with stable proliferative diabetic retinopathy, bilateral: Secondary | ICD-10-CM | POA: Diagnosis not present

## 2021-01-22 NOTE — Progress Notes (Signed)
Referring Provider: Iona Beard, MD Primary Care Physician:  Iona Beard, MD Primary GI Physician: Dr. Gala Romney  Chief Complaint  Patient presents with   Gastroesophageal Reflux    F/u. C/o vomiting, worse in December (daily). Now not as often   Dysphagia    HPI:   [name] is a 81 y.o. female presenting today for follow-up of dysphagia and GERD.  She was last seen in our office in August 2022 for the same, and daughter helped provide history.  She reported feeling liquids going down the wrong way x4 months followed by a lot of coughing.  No trouble with solids.  Symptoms improving with using a straw, going a week or more without symptoms.  She did admit to large pill dysphagia as well.  GERD is well controlled on omeprazole 20 mg daily unless containing known dietary triggers.  When GERD flares, she had associated vomiting.  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  Denied any significant lower GI symptoms, BRBPR, or melena.  Reported her last colonoscopy was more than 10 years ago.  Plan to proceed with BPE and modified barium swallow first and request colonoscopy records.  We received a notification that no colonoscopy records could be found in the [facility].  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  No definite laryngeal penetration or aspiration.  Barium tablet passed without obstruction.  Modified barium swallow with no frank aspiration the patient did have decreased bolus cohesion with solid textures and occasional flash penetration with thin liquids.  Speech recommended continuing current diet, thin liquids, break large pills in half, crushed, or use pure if needed.  Spoke with patient's daughter thereafter who reported  after seeing speech therapy, symptoms seemed to have resolved.  She was being more careful with eating and continued to use a straw with drinking liquids and had not had any recurrent swallowing issues.  Again reported reflux was well controlled.  Recommended monitoring and letting us know of any recurrent or worsening symptoms.   Today: Presents with her daughter who helps provide history. No significant swallowing issues at this time.  Continues to do fairly well with eating slowly and using a straw with drinking liquids.  Her primary concern is vomiting.  Reports in December, she was having daily nausea and vomiting with emesis a few times a day, unable to eat.  Denies hematemesis or associated abdominal pain.  Symptoms would occur within 30 minutes - 1 hour after eating.  Also was having some nocturnal emesis.  This lasted for a few weeks.  She called Dr. Rebekah [place] (PCP).  She was given some pills for gas and diarrhea, but cannot remember what this was.  States this did not help her at all.  She was not ever having any trouble with gas or diarrhea.  She did start taking omeprazole twice a day and limiting acidic foods (lasagna, B. Ferrone), and greasy foods.  She was eating primarily chicken noodle soup and applesauce.  She has had quite a bit of improvement for the last couple of weeks.  Last episode of vomiting was last week.  She has diversify her diet and is eating more of a regular diet now, but continues to have some issues with early satiety.  States she cannot eat too much or she might vomit.   She is  taking omeprazole 1-2 times a day now.   No bowel trouble. No brbpr, melena, hematemesis, or coffee ground emesis.  No NSAIDs aside from 81 mg aspirin.   Past Medical History:  Diagnosis Date   CKD (chronic kidney disease)    Diabetes mellitus    Diastolic heart failure (HCC)    GERD (gastroesophageal reflux disease)    Hypertension    Parkinson disease ([place])    Scoliosis    Scoliosis     UTI (lower urinary tract infection)     Past Surgical History:  Procedure Laterality Date   boil Left    boil removed from left cheek   EYE SURGERY     Retina Detachment   MASS EXCISION N/A 06/27/2014   Procedure: EXCISION SUBMANDIBULAR STONE;  Surgeon: Izora Gala, MD;  Location: [facility];  Service: ENT;  Laterality: N/A;   sublingual gland removal  2016   tumor removed from rib      Current Outpatient Medications  Medication Sig Dispense Refill   acetaminophen-codeine (TYLENOL #3) 300-30 MG tablet Take by mouth 2 (two) times daily.     aspirin EC 81 MG tablet Take 81 mg by mouth daily.     cloNIDine (CATAPRES) 0.1 MG tablet Take 0.1 mg by mouth 2 (two) times daily. For blood pressure     diltiazem (TIAZAC) 360 MG 24 hr capsule Take 360 mg by mouth daily.     erythromycin (ERY-TAB) 250 MG EC tablet Take 250 mg by mouth.     furosemide (LASIX) 20 MG tablet Take 20 mg by mouth 3 (three) times a week.     HUMALOG MIX 50/50 KWIKPEN (50-50) 100 UNIT/ML Kwikpen Take by mouth 2 (two) times daily with breakfast and lunch. 40 units in AM and 20 units in PM     metoprolol tartrate (LOPRESSOR) 25 MG tablet Take 0.5 tablets (12.5 mg total) by mouth 2 (two) times daily. 30 tablet 1   olopatadine (PATANOL) 0.1 % ophthalmic solution Place 1 drop into both eyes daily as needed for allergies.      omeprazole (PRILOSEC) 20 MG capsule Take 1 capsule (20 mg total) by mouth 2 (two) times daily before a meal. 60 capsule 3   potassium chloride SA (K-DUR,KLOR-CON) 20 MEQ tablet Take 20 mEq by mouth daily.     risedronate (ACTONEL) 150 MG tablet Take 150 mg by mouth every 30 (thirty) days. with water on empty stomach, nothing by mouth or lie down for next 30 minutes.     simvastatin (ZOCOR) 5 MG tablet Take 5 mg by mouth at bedtime.     No current facility-administered medications for this visit.    Allergies as of 01/23/2021 - Review Complete 01/23/2021  Allergen Reaction Noted   Reglan [metoclopramide]   08/22/2020    Family History  Problem Relation Age of Onset   Colon cancer Maternal Grandmother        94    Social History   Socioeconomic History   Marital status: Divorced    Spouse name: Not on file   Number of children: Not on file   Years of education: Not on file   Highest education level: Not on file  Occupational History   Not on file  Tobacco Use   Smoking status: Never   Smokeless tobacco: Never  Substance and Sexual Activity   Alcohol use: No    Alcohol/week: 0.0 standard drinks   Drug use: No   Sexual activity: Yes    Birth  control/protection: Post-menopausal  Other Topics Concern   Not on file  Social History Narrative   Not on file   Social Determinants of Health   Financial Resource Strain: Not on file  Food Insecurity: Not on file  Transportation Needs: Not on file  Physical Activity: Not on file  Stress: Not on file  Social Connections: Not on file    Review of Systems: Gen: Denies fever, chills, cold or flulike symptoms, presyncope, syncope. CV: Denies chest pain, palpitations. Resp: Denies dyspnea or cough. GI: See HPI Heme: See HPI  Physical Exam: BP (!) 143/88    Pulse (!) 107    Temp 97.9 F (36.6 C)    Ht 5\' 5"  (1.651 m)    Wt 170 lb 12.8 oz (77.5 kg)    BMI 28.42 kg/m  General:   Alert and oriented. No distress noted. Pleasant and cooperative. Using a walker.  Head:  Normocephalic and atraumatic. Eyes:  Conjuctiva clear without scleral icterus. Heart:  S1, S2 present without murmurs appreciated. Lungs:  Clear to auscultation bilaterally. No wheezes, rales, or rhonchi. No distress.  Abdomen:  +BS, soft, non-tender and non-distended. No rebound or guarding. No HSM or masses noted. Msk:  Symmetrical without gross deformities. Normal posture. Extremities:  With 2+ bilateral LE pitting edema. Neurologic:  Alert and  oriented to person, place, situation.  Psych:  Normal mood and affect.    Assessment:  81 year old female with  history of CKD, diabetes, diastolic heart failure, HTN, Parkinson's disease, GERD, esophageal dysmotility, presenting today for follow-up of dysphagia with chief complaint of nausea/vomiting.  Nausea with vomiting:  New onset in December with symptoms occurring daily after every meal without associated abdominal pain, hematemesis, coffee-ground emesis, or melena.  Denied typical heartburn symptoms during that time, chronically on omeprazole 20 mg daily.  She did temporarily increase her omeprazole to 20 mg twice daily and limited spicy/greasy items and had had some improvement in her symptoms, last episode of emesis was last week, but continues with early satiety and somewhat poor appetite.  Encouragingly, her weight is stable.  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  I suspect GERD, large hiatal hernia, and esophageal dysmotility are likely contributing to her symptoms, but considering early satiety/poor appetite, I can't rule out other etiologies including gastric outlet obstruction, H. pylori, PUD, gastritis, malignancy.  Discussed monitoring as she is already having some improvement versus pursuing endoscopic evaluation.  Patient and her sister prefer to go ahead and move forward with endoscopic evaluation.  We will arrange for an EGD in the near future.  Discussed scheduling EGD with Dr. Abbey Chatters as Dr. Gala Romney will be out of the office for the next several weeks.  Patient and her sister are both agreeable.  Dysphagia:  Previously with sensation of liquids going down the wrong way, now resolved with using a straw for liquids.  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  She denies any symptoms of feeling that food gets stuck in her esophagus, but has had some trouble with recurrent nausea and  vomiting in the setting of GERD and likely influenced by esophageal dysmotility as per above.  We are planning for EGD for further evaluation.  We will add on possible esophageal dilation as well.    Plan:  Proceed with EGD with possible dilation with propofol with Dr. Abbey Chatters in the near  future. The risks, benefits, and alternatives have been discussed with the patient in detail. The patient states understanding and desires to proceed. ASA 3 Day before procedure: Take one half dose of evening insulin. Day of procedure: No morning insulin. Take omeprazole 20 mg twice daily 30 minutes for breakfast and dinner.  New prescription sent to pharmacy. Dietary recommendations: Avoid fried, fatty, greasy, spicy, citrus foods. Avoid caffeine and carbonated beverages. Avoid chocolate. Try eating 4-6 small meals a day rather than 3 large meals. Do not eat within 3 hours of laying down. Prop head of bed up on wood or bricks to create a 6 inch incline. Follow-up after EGD.   Aliene Altes, PA-C [facility] 01/23/2021

## 2021-01-22 NOTE — H&P (View-Only) (Signed)
Referring Provider: Iona Beard, MD Primary Care Physician:  Iona Beard, MD Primary GI Physician: Dr. Gala Romney  Chief Complaint  Patient presents with   Gastroesophageal Reflux    F/u. C/o vomiting, worse in December (daily). Now not as often   Dysphagia    HPI:   [name] is a 81 y.o. female presenting today for follow-up of dysphagia and GERD.  She was last seen in our office in August 2022 for the same, and daughter helped provide history.  She reported feeling liquids going down the wrong way x4 months followed by a lot of coughing.  No trouble with solids.  Symptoms improving with using a straw, going a week or more without symptoms.  She did admit to large pill dysphagia as well.  GERD is well controlled on omeprazole 20 mg daily unless containing known dietary triggers.  When GERD flares, she had associated vomiting.  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  Denied any significant lower GI symptoms, BRBPR, or melena.  Reported her last colonoscopy was more than 10 years ago.  Plan to proceed with BPE and modified barium swallow first and request colonoscopy records.  We received a notification that no colonoscopy records could be found in the [facility].  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  No definite laryngeal penetration or aspiration.  Barium tablet passed without obstruction.  Modified barium swallow with no frank aspiration the patient did have decreased bolus cohesion with solid textures and occasional flash penetration with thin liquids.  Speech recommended continuing current diet, thin liquids, break large pills in half, crushed, or use pure if needed.  Spoke with patient's daughter thereafter who reported  after seeing speech therapy, symptoms seemed to have resolved.  She was being more careful with eating and continued to use a straw with drinking liquids and had not had any recurrent swallowing issues.  Again reported reflux was well controlled.  Recommended monitoring and letting us know of any recurrent or worsening symptoms.   Today: Presents with her daughter who helps provide history. No significant swallowing issues at this time.  Continues to do fairly well with eating slowly and using a straw with drinking liquids.  Her primary concern is vomiting.  Reports in December, she was having daily nausea and vomiting with emesis a few times a day, unable to eat.  Denies hematemesis or associated abdominal pain.  Symptoms would occur within 30 minutes - 1 hour after eating.  Also was having some nocturnal emesis.  This lasted for a few weeks.  She called Dr. Rebekah [place] (PCP).  She was given some pills for gas and diarrhea, but cannot remember what this was.  States this did not help her at all.  She was not ever having any trouble with gas or diarrhea.  She did start taking omeprazole twice a day and limiting acidic foods (lasagna, B. Ferrone), and greasy foods.  She was eating primarily chicken noodle soup and applesauce.  She has had quite a bit of improvement for the last couple of weeks.  Last episode of vomiting was last week.  She has diversify her diet and is eating more of a regular diet now, but continues to have some issues with early satiety.  States she cannot eat too much or she might vomit.   She is  taking omeprazole 1-2 times a day now.   No bowel trouble. No brbpr, melena, hematemesis, or coffee ground emesis.  No NSAIDs aside from 81 mg aspirin.   Past Medical History:  Diagnosis Date   CKD (chronic kidney disease)    Diabetes mellitus    Diastolic heart failure (HCC)    GERD (gastroesophageal reflux disease)    Hypertension    Parkinson disease ([place])    Scoliosis    Scoliosis     UTI (lower urinary tract infection)     Past Surgical History:  Procedure Laterality Date   boil Left    boil removed from left cheek   EYE SURGERY     Retina Detachment   MASS EXCISION N/A 06/27/2014   Procedure: EXCISION SUBMANDIBULAR STONE;  Surgeon: Izora Gala, MD;  Location: [facility];  Service: ENT;  Laterality: N/A;   sublingual gland removal  2016   tumor removed from rib      Current Outpatient Medications  Medication Sig Dispense Refill   acetaminophen-codeine (TYLENOL #3) 300-30 MG tablet Take by mouth 2 (two) times daily.     aspirin EC 81 MG tablet Take 81 mg by mouth daily.     cloNIDine (CATAPRES) 0.1 MG tablet Take 0.1 mg by mouth 2 (two) times daily. For blood pressure     diltiazem (TIAZAC) 360 MG 24 hr capsule Take 360 mg by mouth daily.     erythromycin (ERY-TAB) 250 MG EC tablet Take 250 mg by mouth.     furosemide (LASIX) 20 MG tablet Take 20 mg by mouth 3 (three) times a week.     HUMALOG MIX 50/50 KWIKPEN (50-50) 100 UNIT/ML Kwikpen Take by mouth 2 (two) times daily with breakfast and lunch. 40 units in AM and 20 units in PM     metoprolol tartrate (LOPRESSOR) 25 MG tablet Take 0.5 tablets (12.5 mg total) by mouth 2 (two) times daily. 30 tablet 1   olopatadine (PATANOL) 0.1 % ophthalmic solution Place 1 drop into both eyes daily as needed for allergies.      omeprazole (PRILOSEC) 20 MG capsule Take 1 capsule (20 mg total) by mouth 2 (two) times daily before a meal. 60 capsule 3   potassium chloride SA (K-DUR,KLOR-CON) 20 MEQ tablet Take 20 mEq by mouth daily.     risedronate (ACTONEL) 150 MG tablet Take 150 mg by mouth every 30 (thirty) days. with water on empty stomach, nothing by mouth or lie down for next 30 minutes.     simvastatin (ZOCOR) 5 MG tablet Take 5 mg by mouth at bedtime.     No current facility-administered medications for this visit.    Allergies as of 01/23/2021 - Review Complete 01/23/2021  Allergen Reaction Noted   Reglan [metoclopramide]   08/22/2020    Family History  Problem Relation Age of Onset   Colon cancer Maternal Grandmother        18    Social History   Socioeconomic History   Marital status: Divorced    Spouse name: Not on file   Number of children: Not on file   Years of education: Not on file   Highest education level: Not on file  Occupational History   Not on file  Tobacco Use   Smoking status: Never   Smokeless tobacco: Never  Substance and Sexual Activity   Alcohol use: No    Alcohol/week: 0.0 standard drinks   Drug use: No   Sexual activity: Yes    Birth  control/protection: Post-menopausal  Other Topics Concern   Not on file  Social History Narrative   Not on file   Social Determinants of Health   Financial Resource Strain: Not on file  Food Insecurity: Not on file  Transportation Needs: Not on file  Physical Activity: Not on file  Stress: Not on file  Social Connections: Not on file    Review of Systems: Gen: Denies fever, chills, cold or flulike symptoms, presyncope, syncope. CV: Denies chest pain, palpitations. Resp: Denies dyspnea or cough. GI: See HPI Heme: See HPI  Physical Exam: BP (!) 143/88    Pulse (!) 107    Temp 97.9 F (36.6 C)    Ht 5\' 5"  (1.651 m)    Wt 170 lb 12.8 oz (77.5 kg)    BMI 28.42 kg/m  General:   Alert and oriented. No distress noted. Pleasant and cooperative. Using a walker.  Head:  Normocephalic and atraumatic. Eyes:  Conjuctiva clear without scleral icterus. Heart:  S1, S2 present without murmurs appreciated. Lungs:  Clear to auscultation bilaterally. No wheezes, rales, or rhonchi. No distress.  Abdomen:  +BS, soft, non-tender and non-distended. No rebound or guarding. No HSM or masses noted. Msk:  Symmetrical without gross deformities. Normal posture. Extremities:  With 2+ bilateral LE pitting edema. Neurologic:  Alert and  oriented to person, place, situation.  Psych:  Normal mood and affect.    Assessment:  81 year old female with  history of CKD, diabetes, diastolic heart failure, HTN, Parkinson's disease, GERD, esophageal dysmotility, presenting today for follow-up of dysphagia with chief complaint of nausea/vomiting.  Nausea with vomiting:  New onset in December with symptoms occurring daily after every meal without associated abdominal pain, hematemesis, coffee-ground emesis, or melena.  Denied typical heartburn symptoms during that time, chronically on omeprazole 20 mg daily.  She did temporarily increase her omeprazole to 20 mg twice daily and limited spicy/greasy items and had had some improvement in her symptoms, last episode of emesis was last week, but continues with early satiety and somewhat poor appetite.  Encouragingly, her weight is stable.  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  I suspect GERD, large hiatal hernia, and esophageal dysmotility are likely contributing to her symptoms, but considering early satiety/poor appetite, I can't rule out other etiologies including gastric outlet obstruction, H. pylori, PUD, gastritis, malignancy.  Discussed monitoring as she is already having some improvement versus pursuing endoscopic evaluation.  Patient and her sister prefer to go ahead and move forward with endoscopic evaluation.  We will arrange for an EGD in the near future.  Discussed scheduling EGD with Dr. Abbey Chatters as Dr. Gala Romney will be out of the office for the next several weeks.  Patient and her sister are both agreeable.  Dysphagia:  Previously with sensation of liquids going down the wrong way, now resolved with using a straw for liquids.  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  She denies any symptoms of feeling that food gets stuck in her esophagus, but has had some trouble with recurrent nausea and  vomiting in the setting of GERD and likely influenced by esophageal dysmotility as per above.  We are planning for EGD for further evaluation.  We will add on possible esophageal dilation as well.    Plan:  Proceed with EGD with possible dilation with propofol with Dr. Abbey Chatters in the near  future. The risks, benefits, and alternatives have been discussed with the patient in detail. The patient states understanding and desires to proceed. ASA 3 Day before procedure: Take one half dose of evening insulin. Day of procedure: No morning insulin. Take omeprazole 20 mg twice daily 30 minutes for breakfast and dinner.  New prescription sent to pharmacy. Dietary recommendations: Avoid fried, fatty, greasy, spicy, citrus foods. Avoid caffeine and carbonated beverages. Avoid chocolate. Try eating 4-6 small meals a day rather than 3 large meals. Do not eat within 3 hours of laying down. Prop head of bed up on wood or bricks to create a 6 inch incline. Follow-up after EGD.   Aliene Altes, PA-C [facility] 01/23/2021

## 2021-01-23 ENCOUNTER — Encounter: Payer: Self-pay | Admitting: Gastroenterology

## 2021-01-23 ENCOUNTER — Ambulatory Visit (INDEPENDENT_AMBULATORY_CARE_PROVIDER_SITE_OTHER): Payer: 59 | Admitting: Gastroenterology

## 2021-01-23 ENCOUNTER — Telehealth: Payer: Self-pay

## 2021-01-23 ENCOUNTER — Other Ambulatory Visit: Payer: Self-pay

## 2021-01-23 VITALS — BP 143/88 | HR 107 | Temp 97.9°F | Ht 65.0 in | Wt 170.8 lb

## 2021-01-23 DIAGNOSIS — K219 Gastro-esophageal reflux disease without esophagitis: Secondary | ICD-10-CM

## 2021-01-23 DIAGNOSIS — R112 Nausea with vomiting, unspecified: Secondary | ICD-10-CM | POA: Diagnosis not present

## 2021-01-23 DIAGNOSIS — R1319 Other dysphagia: Secondary | ICD-10-CM

## 2021-01-23 MED ORDER — OMEPRAZOLE 20 MG PO CPDR: 20.0000 mg | DELAYED_RELEASE_CAPSULE | Freq: Two times a day (BID) | ORAL | 3 refills | Status: DC

## 2021-01-23 NOTE — Patient Instructions (Addendum)
We will arrange for you to have an upper endoscopy with possible stretching of your esophagus with Dr. Abbey Chatters in the near future.  1 day prior to your procedure: Take your normal morning dose of insulin.  Take one half dose of your evening insulin. Day of your procedure: Do not take any morning insulin.  Increase omeprazole to 20 mg twice daily 30 minutes before breakfast and dinner.  I have sent a new prescription to your pharmacy.  Dietary recommendations: Avoid fried, fatty, greasy, spicy, citrus foods. Avoid caffeine and carbonated beverages. Avoid chocolate. Try eating 4-6 small meals a day rather than 3 large meals. Do not eat within 3 hours of laying down. Prop head of bed up on wood or bricks to create a 6 inch incline.   We will see back in the office after your procedure.  Do not hesitate to call if you have any questions or concerns prior to next visit.  It was great to see you again!  Happy new year!  Aliene Altes, PA-C [facility]

## 2021-01-23 NOTE — Telephone Encounter (Signed)
PA for EGD/-/+DIL submitted via [facility] website. PA# [id number], valid 02/21/21-05/22/21.

## 2021-02-05 ENCOUNTER — Other Ambulatory Visit: Payer: Self-pay

## 2021-02-05 ENCOUNTER — Encounter: Payer: Self-pay | Admitting: Emergency Medicine

## 2021-02-05 ENCOUNTER — Ambulatory Visit
Admission: EM | Admit: 2021-02-05 | Discharge: 2021-02-05 | Disposition: A | Discharge status: Home / Self Care | Payer: 59 | Attending: Urgent Care | Admitting: Urgent Care

## 2021-02-05 DIAGNOSIS — N189 Chronic kidney disease, unspecified: Secondary | ICD-10-CM

## 2021-02-05 DIAGNOSIS — Z794 Long term (current) use of insulin: Secondary | ICD-10-CM

## 2021-02-05 DIAGNOSIS — R21 Rash and other nonspecific skin eruption: Secondary | ICD-10-CM

## 2021-02-05 DIAGNOSIS — B372 Candidiasis of skin and nail: Secondary | ICD-10-CM | POA: Diagnosis not present

## 2021-02-05 DIAGNOSIS — E119 Type 2 diabetes mellitus without complications: Secondary | ICD-10-CM | POA: Diagnosis not present

## 2021-02-05 MED ORDER — KETOCONAZOLE 2 % EX CREA: 1.0000 "application " | TOPICAL_CREAM | Freq: Every day | CUTANEOUS | 0 refills | Status: DC

## 2021-02-05 NOTE — ED Provider Notes (Signed)
[place]   MRN: [id number] DOB: October 03, 1940  Subjective:   [name] is a 81 y.o. female presenting for 4-5 day history of acute onset persistent itching, rash of the lower abdomen.  Initially, the patient and her daughter thought that this was due to using pull ups.  However, they ended up changing back to panties this week and the symptoms have persisted.  There is no tenderness, drainage of pus or bleeding.  They have used A&E ointment over the area with out any change in her symptoms.  She is a type II diabetic, is treated with insulin, has CKD.  Last creatinine level was done a couple of weeks ago.  No current facility-administered medications for this encounter.  Current Outpatient Medications:    acetaminophen-codeine (TYLENOL #3) 300-30 MG tablet, Take by mouth 2 (two) times daily., Disp: , Rfl:    aspirin EC 81 MG tablet, Take 81 mg by mouth daily., Disp: , Rfl:    cloNIDine (CATAPRES) 0.1 MG tablet, Take 0.1 mg by mouth 2 (two) times daily. For blood pressure, Disp: , Rfl:    diltiazem (TIAZAC) 360 MG 24 hr capsule, Take 360 mg by mouth daily., Disp: , Rfl:    erythromycin (ERY-TAB) 250 MG EC tablet, Take 250 mg by mouth., Disp: , Rfl:    furosemide (LASIX) 20 MG tablet, Take 20 mg by mouth 3 (three) times a week., Disp: , Rfl:    HUMALOG MIX 50/50 KWIKPEN (50-50) 100 UNIT/ML Kwikpen, Take by mouth 2 (two) times daily with breakfast and lunch. 40 units in AM and 20 units in PM, Disp: , Rfl:    metoprolol tartrate (LOPRESSOR) 25 MG tablet, Take 0.5 tablets (12.5 mg total) by mouth 2 (two) times daily., Disp: 30 tablet, Rfl: 1   olopatadine (PATANOL) 0.1 % ophthalmic solution, Place 1 drop into both eyes daily as needed for allergies. , Disp: , Rfl:    omeprazole (PRILOSEC) 20 MG capsule, Take 1 capsule (20 mg total) by mouth 2 (two) times daily before a meal., Disp: 60 capsule, Rfl: 3   potassium chloride SA (K-DUR,KLOR-CON) 20 MEQ tablet, Take 20 mEq by mouth  daily., Disp: , Rfl:    risedronate (ACTONEL) 150 MG tablet, Take 150 mg by mouth every 30 (thirty) days. 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[place])    Scoliosis    Scoliosis    UTI (lower urinary tract infection)      Past Surgical History:  Procedure Laterality Date   boil Left    boil removed from left cheek   EYE SURGERY     Retina Detachment   MASS EXCISION N/A 06/27/2014   Procedure: EXCISION SUBMANDIBULAR STONE;  Surgeon: Izora Gala, MD;  Location: [facility];  Service: ENT;  Laterality: N/A;   sublingual gland removal  2016   tumor removed from rib      Family History  Problem Relation Age of Onset   Colon cancer Maternal Grandmother  38    Social History   Tobacco Use   Smoking status: Never   Smokeless tobacco: Never  Substance Use Topics   Alcohol use: No    Alcohol/week: 0.0 standard drinks   Drug use: No    ROS   Objective:   Vitals: BP 135/78 (BP Location: Right Arm)    Pulse (!) 111    Temp (!) 97.4 F (36.3 C) (Oral)    Resp 18    Ht 5\' 5"  (1.651 m)    Wt 169 lb 12.1 oz (77 kg)    SpO2 93%    BMI 28.25 kg/m   Physical Exam Constitutional:      General: She is not in acute distress.    Appearance: Normal appearance. She is well-developed. She is not ill-appearing, toxic-appearing or diaphoretic.  HENT:     Head: Normocephalic and atraumatic.     Nose: Nose normal.     Mouth/Throat:     Mouth: Mucous membranes are moist.  Eyes:     General: No scleral icterus.       Right eye: No discharge.        Left eye: No discharge.      Extraocular Movements: Extraocular movements intact.  Cardiovascular:     Rate and Rhythm: Normal rate.  Pulmonary:     Effort: Pulmonary effort is normal.  Skin:    General: Skin is warm and dry.     Findings: Rash present.       Neurological:     General: No focal deficit present.     Mental Status: She is alert and oriented to person, place, and time.  Psychiatric:        Mood and Affect: Mood normal.        Behavior: Behavior normal.    Assessment and Plan :   PDMP not reviewed this encounter.  1. Yeast infection of the skin   2. Rash and nonspecific skin eruption   3. Chronic kidney disease, unspecified CKD stage   4. Type 2 diabetes mellitus treated with insulin (HCC)    Recommended management with ketoconazole cream for yeast infection of the skin of the lower abdomen/pannus.  Given her CKD, this medication should be safe.  Follow-up with PCP. Counseled patient on potential for adverse effects with medications prescribed/recommended today, ER and return-to-clinic precautions discussed, patient verbalized understanding.    Jaynee Eagles, [place] 02/05/21 1217

## 2021-02-05 NOTE — Discharge Instructions (Signed)
Please apply the ketoconazole cream generously to the yeast infection of the lower abdomen.  Do this once daily for 2 weeks at least.

## 2021-02-05 NOTE — ED Triage Notes (Signed)
Pt reports skin irritation to waistline for last several days. Pt daughter reports has tried otc A&D ointment and changed from pullups to cotton underwear but reports rash/irritiation persists.

## 2021-02-17 NOTE — Patient Instructions (Signed)
[name]  02/17/2021     @PREFPERIOPPHARMACY @   Your procedure is scheduled on  02/21/2021.   Report to [facility] at  0930  A.M.   Call this number if you have problems the morning of surgery:  [phone]   Remember:  Follow the diet and prep instructions given to them by the office.     Take 1/2 dose of your night time insulin the night before your procedure.    Take these medicines the morning of surgery with A SIP OF WATER                     tylenol #3(if needed), clonidine, prilosec.     Do not wear jewelry, make-up or nail polish.  Do not wear lotions, powders, or perfumes, or deodorant.  Do not shave 48 hours prior to surgery.  Men may shave face and neck.  Do not bring valuables to the hospital.  [facility] is not responsible for any belongings or valuables.  Contacts, dentures or bridgework may not be worn into surgery.  Leave your suitcase in the car.  After surgery it may be brought to your room.  For patients admitted to the hospital, discharge time will be determined by your treatment team.  Patients discharged the day of surgery will not be allowed to drive home and must have someone with them for 24 hours.    Special instructions:   DO NOT smoke tobacco or vape for 24 hours before your procedure.  Please read over the following fact sheets that you were given. Anesthesia Post-op Instructions and Care and Recovery After Surgery      Upper Endoscopy, Adult, Care After This sheet gives you information about how to care for yourself after your procedure. Your health care provider may also give you more specific instructions. If you have problems or questions, contact your health care provider. What can I expect after the procedure? After the procedure, it is common to have: A sore throat. Mild stomach pain or discomfort. Bloating. Nausea. Follow these instructions at home:  Follow instructions from your health care provider about  what to eat or drink after your procedure. Return to your normal activities as told by your health care provider. Ask your health care provider what activities are safe for you. Take over-the-counter and prescription medicines only as told by your health care provider. If you were given a sedative during the procedure, it can affect you for several hours. Do not drive or operate machinery until your health care provider says that it is safe. Keep all follow-up visits as told by your health care provider. This is important. Contact a health care provider if you have: A sore throat that lasts longer than one day. Trouble swallowing. Get help right away if: You vomit blood or your vomit looks like coffee grounds. You have: A fever. Bloody, black, or tarry stools. A severe sore throat or you cannot swallow. Difficulty breathing. Severe pain in your chest or abdomen. Summary After the procedure, it is common to have a sore throat, mild stomach discomfort, bloating, and nausea. If you were given a sedative during the procedure, it can affect you for several hours. Do not drive or operate machinery until your health care provider says that it is safe. Follow instructions from your health care provider about what to eat or drink after your procedure. Return to your normal activities as told by  your health care provider. This information is not intended to replace advice given to you by your health care provider. Make sure you discuss any questions you have with your health care provider. Document Revised: 10/28/2018 Document Reviewed: 05/24/2017 Elsevier Patient Education  2022 [employer]. Esophageal Dilatation Esophageal dilatation, also called esophageal dilation, is a procedure to widen or open a blocked or narrowed part of the esophagus. The esophagus is the part of the body that moves food and liquid from the mouth to the stomach. You may need this procedure if: You have a buildup of scar  tissue in your esophagus that makes it difficult, painful, or impossible to swallow. This can be caused by gastroesophageal reflux disease (GERD). You have cancer of the esophagus. There is a problem with how food moves through your esophagus. In some cases, you may need this procedure repeated at a later time to dilate the esophagus gradually. Tell a health care provider about: Any allergies you have. All medicines you are taking, including vitamins, herbs, eye drops, creams, and over-the-counter medicines. Any problems you or family members have had with anesthetic medicines. Any blood disorders you have. Any surgeries you have had. Any medical conditions you have. Any antibiotic medicines you are required to take before dental procedures. Whether you are pregnant or may be pregnant. What are the risks? Generally, this is a safe procedure. However, problems may occur, including: Bleeding due to a tear in the lining of the esophagus. A hole, or perforation, in the esophagus. What happens before the procedure? Ask your health care provider about: Changing or stopping your regular medicines. This is especially important if you are taking diabetes medicines or blood thinners. Taking medicines such as aspirin and ibuprofen. These medicines can thin your blood. Do not take these medicines unless your health care provider tells you to take them. Taking over-the-counter medicines, vitamins, herbs, and supplements. Follow instructions from your health care provider about eating or drinking restrictions. Plan to have a responsible adult take you home from the hospital or clinic. Plan to have a responsible adult care for you for the time you are told after you leave the hospital or clinic. This is important. What happens during the procedure? You may be given a medicine to help you relax (sedative). A numbing medicine may be sprayed into the back of your throat, or you may gargle the  medicine. Your health care provider may perform the dilatation using various surgical instruments, such as: Simple dilators. This instrument is carefully placed in the esophagus to stretch it. Guided wire bougies. This involves using an endoscope to insert a wire into the esophagus. A dilator is passed over this wire to enlarge the esophagus. Then the wire is removed. Balloon dilators. An endoscope with a small balloon is inserted into the esophagus. The balloon is inflated to stretch the esophagus and open it up. The procedure may vary among health care providers and hospitals. What can I expect after the procedure? Your blood pressure, heart rate, breathing rate, and blood oxygen level will be monitored until you leave the hospital or clinic. Your throat may feel slightly sore and numb. This will get better over time. You will not be allowed to eat or drink until your throat is no longer numb. When you are able to drink, urinate, and sit on the edge of the bed without nausea or dizziness, you may be able to return home. Follow these instructions at home: Take over-the-counter and prescription medicines only as  told by your health care provider. If you were given a sedative during the procedure, it can affect you for several hours. Do not drive or operate machinery until your health care provider says that it is safe. Plan to have a responsible adult care for you for the time you are told. This is important. Follow instructions from your health care provider about any eating or drinking restrictions. Do not use any products that contain nicotine or tobacco, such as cigarettes, e-cigarettes, and chewing tobacco. If you need help quitting, ask your health care provider. Keep all follow-up visits. This is important. Contact a health care provider if: You have a fever. You have pain that is not relieved by medicine. Get help right away if: You have chest pain. You have trouble breathing. You  have trouble swallowing. You vomit blood. You have black, tarry, or bloody stools. These symptoms may represent a serious problem that is an emergency. Do not wait to see if the symptoms will go away. Get medical help right away. Call your local emergency services (911 in the [place] Do not drive yourself to the hospital. Summary Esophageal dilatation, also called esophageal dilation, is a procedure to widen or open a blocked or narrowed part of the esophagus. Plan to have a responsible adult take you home from the hospital or clinic. For this procedure, a numbing medicine may be sprayed into the back of your throat, or you may gargle the medicine. Do not drive or operate machinery until your health care provider says that it is safe. This information is not intended to replace advice given to you by your health care provider. Make sure you discuss any questions you have with your health care provider. Document Revised: 05/10/2019 Document Reviewed: 05/10/2019 Elsevier Patient Education  [place] After This sheet gives you information about how to care for yourself after your procedure. Your health care provider may also give you more specific instructions. If you have problems or questions, contact your health care provider. What can I expect after the procedure? After the procedure, it is common to have: Tiredness. Forgetfulness about what happened after the procedure. Impaired judgment for important decisions. Nausea or vomiting. Some difficulty with balance. Follow these instructions at home: For the time period you were told by your health care provider:   Rest as needed. Do not participate in activities where you could fall or become injured. Do not drive or use machinery. Do not drink alcohol. Do not take sleeping pills or medicines that cause drowsiness. Do not make important decisions or sign legal documents. Do not take care of  children on your own. Eating and drinking Follow the diet that is recommended by your health care provider. Drink enough fluid to keep your urine pale yellow. If you vomit: Drink water, juice, or soup when you can drink without vomiting. Make sure you have little or no nausea before eating solid foods. General instructions Have a responsible adult stay with you for the time you are told. It is important to have someone help care for you until you are awake and alert. Take over-the-counter and prescription medicines only as told by your health care provider. If you have sleep apnea, surgery and certain medicines can increase your risk for breathing problems. Follow instructions from your health care provider about wearing your sleep device: Anytime you are sleeping, including during daytime naps. While taking prescription pain medicines, sleeping medicines, or medicines that make you drowsy. Avoid  smoking. Keep all follow-up visits as told by your health care provider. This is important. Contact a health care provider if: You keep feeling nauseous or you keep vomiting. You feel light-headed. You are still sleepy or having trouble with balance after 24 hours. You develop a rash. You have a fever. You have redness or swelling around the IV site. Get help right away if: You have trouble breathing. You have new-onset confusion at home. Summary For several hours after your procedure, you may feel tired. You may also be forgetful and have poor judgment. Have a responsible adult stay with you for the time you are told. It is important to have someone help care for you until you are awake and alert. Rest as told. Do not drive or operate machinery. Do not drink alcohol or take sleeping pills. Get help right away if you have trouble breathing, or if you suddenly become confused. This information is not intended to replace advice given to you by your health care provider. Make sure you discuss any  questions you have with your health care provider. Document Revised: 09/07/2019 Document Reviewed: 11/24/2018 Elsevier Patient Education  2022 [employer].

## 2021-02-18 ENCOUNTER — Encounter (HOSPITAL_COMMUNITY)
Admission: RE | Admit: 2021-02-18 | Discharge: 2021-02-18 | Disposition: A | Discharge status: Home / Self Care | Payer: 59 | Source: Ambulatory Visit | Attending: Internal Medicine | Admitting: Internal Medicine

## 2021-02-18 VITALS — BP 144/92 | HR 94 | Temp 97.5°F | Resp 18 | Ht 65.0 in | Wt 170.0 lb

## 2021-02-18 DIAGNOSIS — Z79899 Other long term (current) drug therapy: Secondary | ICD-10-CM

## 2021-02-18 DIAGNOSIS — Z01818 Encounter for other preprocedural examination: Secondary | ICD-10-CM | POA: Diagnosis present

## 2021-02-18 LAB — BASIC METABOLIC PANEL
Anion gap: 13 (ref 5–15)
BUN: 19 mg/dL (ref 8–23)
CO2: 23 mmol/L (ref 22–32)
Calcium: 9 mg/dL (ref 8.9–10.3)
Chloride: 100 mmol/L (ref 98–111)
Creatinine, Ser: 1.36 mg/dL — ABNORMAL HIGH (ref 0.44–1.00)
GFR, Estimated: 39 mL/min — ABNORMAL LOW (ref >60)
Glucose, Bld: 130 mg/dL — ABNORMAL HIGH (ref 70–99)
Potassium: 3.6 mmol/L (ref 3.5–5.1)
Sodium: 136 mmol/L (ref 135–145)

## 2021-02-21 ENCOUNTER — Ambulatory Visit (HOSPITAL_COMMUNITY)
Admission: RE | Admit: 2021-02-21 | Discharge: 2021-02-21 | Disposition: A | Discharge status: Home / Self Care | Payer: 59 | Attending: Internal Medicine | Admitting: Internal Medicine

## 2021-02-21 ENCOUNTER — Encounter (HOSPITAL_COMMUNITY): Payer: Self-pay

## 2021-02-21 ENCOUNTER — Ambulatory Visit (HOSPITAL_COMMUNITY): Payer: 59 | Admitting: Anesthesiology

## 2021-02-21 ENCOUNTER — Encounter (HOSPITAL_COMMUNITY)
Admission: RE | Disposition: A | Discharge status: Home / Self Care | Payer: Self-pay | Source: Home / Self Care | Attending: Internal Medicine

## 2021-02-21 ENCOUNTER — Ambulatory Visit (HOSPITAL_BASED_OUTPATIENT_CLINIC_OR_DEPARTMENT_OTHER): Payer: 59 | Admitting: Anesthesiology

## 2021-02-21 DIAGNOSIS — K222 Esophageal obstruction: Secondary | ICD-10-CM | POA: Diagnosis not present

## 2021-02-21 DIAGNOSIS — Z79899 Other long term (current) drug therapy: Secondary | ICD-10-CM | POA: Diagnosis not present

## 2021-02-21 DIAGNOSIS — I110 Hypertensive heart disease with heart failure: Secondary | ICD-10-CM | POA: Diagnosis not present

## 2021-02-21 DIAGNOSIS — N189 Chronic kidney disease, unspecified: Secondary | ICD-10-CM | POA: Insufficient documentation

## 2021-02-21 DIAGNOSIS — K449 Diaphragmatic hernia without obstruction or gangrene: Secondary | ICD-10-CM | POA: Insufficient documentation

## 2021-02-21 DIAGNOSIS — I130 Hypertensive heart and chronic kidney disease with heart failure and stage 1 through stage 4 chronic kidney disease, or unspecified chronic kidney disease: Secondary | ICD-10-CM | POA: Diagnosis not present

## 2021-02-21 DIAGNOSIS — Z794 Long term (current) use of insulin: Secondary | ICD-10-CM | POA: Insufficient documentation

## 2021-02-21 DIAGNOSIS — G20 Parkinson's disease: Secondary | ICD-10-CM | POA: Diagnosis not present

## 2021-02-21 DIAGNOSIS — K219 Gastro-esophageal reflux disease without esophagitis: Secondary | ICD-10-CM | POA: Diagnosis not present

## 2021-02-21 DIAGNOSIS — K224 Dyskinesia of esophagus: Secondary | ICD-10-CM | POA: Insufficient documentation

## 2021-02-21 DIAGNOSIS — I5032 Chronic diastolic (congestive) heart failure: Secondary | ICD-10-CM | POA: Insufficient documentation

## 2021-02-21 DIAGNOSIS — Z8673 Personal history of transient ischemic attack (TIA), and cerebral infarction without residual deficits: Secondary | ICD-10-CM | POA: Diagnosis not present

## 2021-02-21 DIAGNOSIS — E1122 Type 2 diabetes mellitus with diabetic chronic kidney disease: Secondary | ICD-10-CM | POA: Insufficient documentation

## 2021-02-21 DIAGNOSIS — K319 Disease of stomach and duodenum, unspecified: Secondary | ICD-10-CM | POA: Insufficient documentation

## 2021-02-21 DIAGNOSIS — K2970 Gastritis, unspecified, without bleeding: Secondary | ICD-10-CM | POA: Diagnosis not present

## 2021-02-21 DIAGNOSIS — Z7982 Long term (current) use of aspirin: Secondary | ICD-10-CM | POA: Insufficient documentation

## 2021-02-21 DIAGNOSIS — R1310 Dysphagia, unspecified: Secondary | ICD-10-CM | POA: Diagnosis present

## 2021-02-21 HISTORY — PX: BALLOON DILATION: SHX5330

## 2021-02-21 HISTORY — PX: BIOPSY: SHX5522

## 2021-02-21 HISTORY — PX: ESOPHAGOGASTRODUODENOSCOPY (EGD) WITH PROPOFOL: SHX5813

## 2021-02-21 LAB — GLUCOSE, CAPILLARY: Glucose-Capillary: 110 mg/dL — ABNORMAL HIGH (ref 70–99)

## 2021-02-21 SURGERY — ESOPHAGOGASTRODUODENOSCOPY (EGD) WITH PROPOFOL
Anesthesia: General

## 2021-02-21 MED ORDER — PROPOFOL 10 MG/ML IV BOLUS
INTRAVENOUS | Status: DC | PRN
  Administered 2021-02-21: 30 mg via INTRAVENOUS
  Administered 2021-02-21: 80 mg via INTRAVENOUS
  Administered 2021-02-21: 20 mg via INTRAVENOUS

## 2021-02-21 MED ORDER — LACTATED RINGERS IV SOLN: INTRAVENOUS | Status: DC

## 2021-02-21 MED ORDER — LIDOCAINE HCL (CARDIAC) PF 100 MG/5ML IV SOSY
PREFILLED_SYRINGE | INTRAVENOUS | Status: DC | PRN
  Administered 2021-02-21: 50 mg via INTRAVENOUS

## 2021-02-21 NOTE — Transfer of Care (Signed)
Immediate Anesthesia Transfer of Care Note  Patient: [name]  Procedure(s) Performed: ESOPHAGOGASTRODUODENOSCOPY (EGD) WITH PROPOFOL BALLOON DILATION BIOPSY  Patient Location: Short Stay  Anesthesia Type:General  Level of Consciousness: awake  Airway & Oxygen Therapy: Patient Spontanous Breathing  Post-op Assessment: Report given to RN and Post -op Vital signs reviewed and stable  Post vital signs: Reviewed and stable  Last Vitals:  Vitals Value Taken Time  BP    Temp    Pulse 93 02/21/21 1131  Resp 19 02/21/21 1131  SpO2 100 % 02/21/21 1131    Last Pain:  Vitals:   02/21/21 1131  TempSrc:   PainSc: 0-No pain      Patients Stated Pain Goal: (P) 6 (44/92/01 0071)  Complications: No notable events documented.

## 2021-02-21 NOTE — Anesthesia Preprocedure Evaluation (Signed)
Anesthesia Evaluation  Patient identified by MRN, date of birth, ID band Patient awake    Reviewed: Allergy & Precautions, NPO status , Patient's Chart, lab work & pertinent test results  Airway Mallampati: II  TM Distance: >3 FB Neck ROM: Full    Dental  (+) Dental Advisory Given, Upper Dentures, Edentulous Lower   Pulmonary neg pulmonary ROS,    Pulmonary exam normal breath sounds clear to auscultation       Cardiovascular Exercise Tolerance: Poor hypertension, Pt. on medications +CHF  Normal cardiovascular exam+ dysrhythmias (occcasional PVCs)  Rhythm:Regular Rate:Normal  18-Feb-2021 11:31:32 [place] [facility] ROUTINE RECORD 06-26-1940 (74 yr) Female Black Vent. rate 95 BPM PR interval 188 ms QRS duration 74 ms QT/QTcB 366/459 ms P-R-T axes 57 23 20 Normal sinus rhythm Low voltage QRS Borderline ECG When compared with ECG of 17-Nov-2014 15:02,lower voltage PREVIOUS ECG IS PRESENT Confirmed by Asencion Noble [phone]) on 02/19/2021 6:56:51 AM   Neuro/Psych  Neuromuscular disease (parkinson's disease) CVA, Residual Symptoms negative psych ROS   GI/Hepatic Neg liver ROS, GERD  Medicated,  Endo/Other  diabetes, Well Controlled, Type 2, Insulin Dependent  Renal/GU Renal InsufficiencyRenal disease  negative genitourinary   Musculoskeletal  (+) Arthritis , Osteoarthritis,  Scoliosis    Abdominal   Peds negative pediatric ROS (+)  Hematology negative hematology ROS (+)   Anesthesia Other Findings   Reproductive/Obstetrics negative OB ROS                             Anesthesia Physical Anesthesia Plan  ASA: 3  Anesthesia Plan: General   Post-op Pain Management: Minimal or no pain anticipated   Induction:   PONV Risk Score and Plan: Treatment may vary due to age or medical condition  Airway Management Planned: Nasal Cannula and Natural Airway  Additional Equipment:    Intra-op Plan:   Post-operative Plan:   Informed Consent: I have reviewed the patients History and Physical, chart, labs and discussed the procedure including the risks, benefits and alternatives for the proposed anesthesia with the patient or authorized representative who has indicated his/her understanding and acceptance.     Dental advisory given  Plan Discussed with: CRNA and Surgeon  Anesthesia Plan Comments:         Anesthesia Quick Evaluation

## 2021-02-21 NOTE — Op Note (Signed)
[facility] Patient Name: [name] Procedure Date: 02/21/2021 11:09 AM MRN: [id number] Date of Birth: 1940/05/03 Attending MD: Elon Alas. Abbey Chatters DO CSN: [id number] Age: 81 Admit Type: Outpatient Procedure:                Upper GI endoscopy Indications:              Dysphagia, Nausea with vomiting Providers:                Elon Alas. Abbey Chatters, DO, Hughie Closs RN, RN, Lurline Del, RN Referring MD:              Medicines:                See the Anesthesia note for documentation of the                            administered medications Complications:            No immediate complications. Estimated Blood Loss:     Estimated blood loss was minimal. Procedure:                Pre-Anesthesia Assessment:                           - The anesthesia plan was to use monitored                            anesthesia care (MAC).                           After obtaining informed consent, the endoscope was                            passed under direct vision. Throughout the                            procedure, the patient's blood pressure, pulse, and                            oxygen saturations were monitored continuously. The                            [id number]-H190 ([id number]) scope was introduced through the                            mouth, and advanced to the second part of duodenum.                            The upper GI endoscopy was accomplished without                            difficulty. The patient tolerated the procedure                            well. Scope In:  11:19:00 AM Scope Out: 11:25:26 AM Total Procedure Duration: 0 hours 6 minutes 26 seconds  Findings:      A 7 cm hiatal hernia was present.      A mild Schatzki ring was found in the lower third of the esophagus. A       TTS dilator was passed through the scope. Dilation with an 18-19-20 mm       balloon dilator was performed to 18 mm. The dilation site was examined       and showed mild  mucosal disruption and moderate improvement in luminal       narrowing.      Diffuse mild inflammation characterized by erythema was found in the       entire examined stomach. Biopsies were taken with a cold forceps for       Helicobacter pylori testing.      The duodenal bulb, first portion of the duodenum and second portion of       the duodenum were normal. Impression:               - 7 cm hiatal hernia.                           - Mild Schatzki ring. Dilated.                           - Gastritis. Biopsied.                           - Normal duodenal bulb, first portion of the                            duodenum and second portion of the duodenum. Moderate Sedation:      Per Anesthesia Care Recommendation:           - Patient has a contact number available for                            emergencies. The signs and symptoms of potential                            delayed complications were discussed with the                            patient. Return to normal activities tomorrow.                            Written discharge instructions were provided to the                            patient.                           - Resume previous diet.                           - Continue present medications.                           -  Await pathology results.                           - Repeat upper endoscopy PRN for retreatment.                           - Return to GI clinic in 4 months.                           - Use a proton pump inhibitor PO BID. Procedure Code(s):        --- Professional ---                           [phone], Esophagogastroduodenoscopy, flexible,                            transoral; with transendoscopic balloon dilation of                            esophagus (less than 30 mm diameter)                           43239, 59, Esophagogastroduodenoscopy, flexible,                            transoral; with biopsy, single or multiple Diagnosis Code(s):        ---  Professional ---                           K44.9, Diaphragmatic hernia without obstruction or                            gangrene                           K22.2, Esophageal obstruction                           K29.70, Gastritis, unspecified, without bleeding                           R13.10, Dysphagia, unspecified                           R11.2, Nausea with vomiting, unspecified CPT copyright 2019 American Medical Association. All rights reserved. The codes documented in this report are preliminary and upon coder review may  be revised to meet current compliance requirements. Elon Alas. Abbey Chatters, DO [place] Abbey Chatters, DO 02/21/2021 11:28:59 AM This report has been signed electronically. Number of Addenda: 0

## 2021-02-21 NOTE — Interval H&P Note (Signed)
History and Physical Interval Note:  02/21/2021 11:06 AM  [name]  has presented today for surgery, with the diagnosis of GERD, dysphagia, nausea/vomiting.  The various methods of treatment have been discussed with the patient and family. After consideration of risks, benefits and other options for treatment, the patient has consented to  Procedure(s) with comments: ESOPHAGOGASTRODUODENOSCOPY (EGD) WITH PROPOFOL (N/A) - 11:00am BALLOON DILATION (N/A) as a surgical intervention.  The patient's history has been reviewed, patient examined, no change in status, stable for surgery.  I have reviewed the patient's chart and labs.  Questions were answered to the patient's satisfaction.     Eloise Harman

## 2021-02-21 NOTE — Discharge Instructions (Addendum)
EGD Discharge instructions Please read the instructions outlined below and refer to this sheet in the next few weeks. These discharge instructions provide you with general information on caring for yourself after you leave the hospital. Your doctor may also give you specific instructions. While your treatment has been planned according to the most current medical practices available, unavoidable complications occasionally occur. If you have any problems or questions after discharge, please call your doctor. ACTIVITY You may resume your regular activity but move at a slower pace for the next 24 hours.  Take frequent rest periods for the next 24 hours.  Walking will help expel (get rid of) the air and reduce the bloated feeling in your abdomen.  No driving for 24 hours (because of the anesthesia (medicine) used during the test).  You may shower.  Do not sign any important legal documents or operate any machinery for 24 hours (because of the anesthesia used during the test).  NUTRITION Drink plenty of fluids.  You may resume your normal diet.  Begin with a light meal and progress to your normal diet.  Avoid alcoholic beverages for 24 hours or as instructed by your caregiver.  MEDICATIONS You may resume your normal medications unless your caregiver tells you otherwise.  WHAT YOU CAN EXPECT TODAY You may experience abdominal discomfort such as a feeling of fullness or gas pains.  FOLLOW-UP Your doctor will discuss the results of your test with you.  SEEK IMMEDIATE MEDICAL ATTENTION IF ANY OF THE FOLLOWING OCCUR: Excessive nausea (feeling sick to your stomach) and/or vomiting.  Severe abdominal pain and distention (swelling).  Trouble swallowing.  Temperature over 101 F (37.8 C).  Rectal bleeding or vomiting of blood.    Your EGD revealed mild amount inflammation in your stomach.  I took biopsies of this to rule out infection with a bacteria called H. pylori.  Await pathology results, my  office will contact you.  You also have a large hiatal hernia.  You did have a Schatzki's ring at the end portion of your esophagus as well which is a fibrous ring that can cause issues with swallowing or regurgitation.  I stretched this with a balloon today.  Hopefully this helps your swallowing.  Continue on omeprazole twice daily.  Follow-up with GI in 4 months.   I hope you have a great rest of your week!  Elon Alas. Abbey Chatters, D.O. Gastroenterology and Hepatology [facility]

## 2021-02-21 NOTE — Anesthesia Postprocedure Evaluation (Signed)
Anesthesia Post Note  Patient: AMIR GLAUS  Procedure(s) Performed: ESOPHAGOGASTRODUODENOSCOPY (EGD) WITH PROPOFOL BALLOON DILATION BIOPSY  Patient location during evaluation: Phase II Anesthesia Type: General Level of consciousness: awake and alert and oriented Pain management: pain level controlled Vital Signs Assessment: post-procedure vital signs reviewed and stable Respiratory status: spontaneous breathing, nonlabored ventilation and respiratory function stable Cardiovascular status: blood pressure returned to baseline and stable Postop Assessment: no apparent nausea or vomiting Anesthetic complications: no   No notable events documented.   Last Vitals:  Vitals:   02/21/21 1131 02/21/21 1136  BP:  (!) 113/51  Pulse: 93   Resp: 19   Temp:    SpO2: 100%     Last Pain:  Vitals:   02/21/21 1131  TempSrc:   PainSc: 0-No pain                 Corinna Burkman C Journe Hallmark

## 2021-02-24 LAB — SURGICAL PATHOLOGY

## 2021-02-25 ENCOUNTER — Encounter (HOSPITAL_COMMUNITY): Payer: Self-pay | Admitting: Internal Medicine

## 2021-02-26 ENCOUNTER — Telehealth: Payer: Self-pay | Admitting: Internal Medicine

## 2021-02-26 NOTE — Telephone Encounter (Signed)
See result note.  

## 2021-02-26 NOTE — Telephone Encounter (Signed)
Pt's daughter was calling to see if patient's EGD results were available. [phone]

## 2021-03-05 ENCOUNTER — Emergency Department (HOSPITAL_COMMUNITY)
Admission: EM | Admit: 2021-03-05 | Discharge: 2021-03-05 | Disposition: A | Discharge status: Home / Self Care | Payer: 59 | Attending: Emergency Medicine | Admitting: Emergency Medicine

## 2021-03-05 ENCOUNTER — Emergency Department (HOSPITAL_COMMUNITY): Payer: 59

## 2021-03-05 ENCOUNTER — Other Ambulatory Visit: Payer: Self-pay

## 2021-03-05 ENCOUNTER — Encounter (HOSPITAL_COMMUNITY): Payer: Self-pay

## 2021-03-05 DIAGNOSIS — M25562 Pain in left knee: Secondary | ICD-10-CM | POA: Diagnosis present

## 2021-03-05 DIAGNOSIS — E119 Type 2 diabetes mellitus without complications: Secondary | ICD-10-CM | POA: Diagnosis not present

## 2021-03-05 DIAGNOSIS — M546 Pain in thoracic spine: Secondary | ICD-10-CM | POA: Insufficient documentation

## 2021-03-05 DIAGNOSIS — M25462 Effusion, left knee: Secondary | ICD-10-CM | POA: Diagnosis not present

## 2021-03-05 DIAGNOSIS — M25561 Pain in right knee: Secondary | ICD-10-CM | POA: Diagnosis not present

## 2021-03-05 DIAGNOSIS — Z79899 Other long term (current) drug therapy: Secondary | ICD-10-CM | POA: Diagnosis not present

## 2021-03-05 DIAGNOSIS — I10 Essential (primary) hypertension: Secondary | ICD-10-CM | POA: Diagnosis not present

## 2021-03-05 DIAGNOSIS — Z794 Long term (current) use of insulin: Secondary | ICD-10-CM | POA: Insufficient documentation

## 2021-03-05 DIAGNOSIS — M25512 Pain in left shoulder: Secondary | ICD-10-CM | POA: Diagnosis not present

## 2021-03-05 DIAGNOSIS — Z7982 Long term (current) use of aspirin: Secondary | ICD-10-CM | POA: Insufficient documentation

## 2021-03-05 LAB — SYNOVIAL CELL COUNT + DIFF, W/ CRYSTALS
Crystals, Fluid: NONE SEEN
Eosinophils-Synovial: 0 % (ref 0–1)
Lymphocytes-Synovial Fld: 0 % (ref 0–20)
Monocyte-Macrophage-Synovial Fluid: 24 % — ABNORMAL LOW (ref 50–90)
Neutrophil, Synovial: 76 % — ABNORMAL HIGH (ref 0–25)
WBC, Synovial: 6865 /mm3 — ABNORMAL HIGH (ref 0–200)

## 2021-03-05 LAB — GRAM STAIN

## 2021-03-05 MED ORDER — NAPROXEN 500 MG PO TABS: 500.0000 mg | ORAL_TABLET | Freq: Two times a day (BID) | ORAL | 0 refills | Status: DC

## 2021-03-05 MED ORDER — LIDOCAINE HCL (PF) 1 % IJ SOLN
5.0000 mL | Freq: Once | INTRAMUSCULAR | Status: AC
  Administered 2021-03-05: 5 mL via INTRADERMAL
  Filled 2021-03-05: qty 5

## 2021-03-05 NOTE — Discharge Instructions (Signed)
Please take Naprosyn, 500mg  by mouth twice daily as needed for pain - this in an antiinflammatory medicine (NSAID) and is similar to ibuprofen - many people feel that it is stronger than ibuprofen and it is easier to take since it is a smaller pill.  Please use this only for 1 week - if your pain persists, you will need to follow up with your doctor in the office for ongoing guidance and pain control.   ? ?

## 2021-03-05 NOTE — ED Triage Notes (Signed)
Pt arrived via EMS with complaints of mid back pain and bilateral knee pain/swelling.  ?

## 2021-03-05 NOTE — ED Provider Notes (Signed)
[facility] EMERGENCY DEPARTMENT Provider Note   CSN: [id number] Arrival date & time: 03/05/21  1100     History  Chief Complaint  Patient presents with   Knee Pain   Back Pain   Joint Swelling    [name] is a 81 y.o. female.   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  She presents to the hospital today by EMS transport with a complaint of bilateral knee pain, swelling in her knees, pain of her mid to upper back and her left shoulder.  This has been going on for about a week, when I asked her what brought her in today she states that just feels like it is worse.  She denies fevers chills nausea vomiting or diarrhea, her appetite has been okay, she lives with her daughter, I have tried to call her daughter [name] on the phone but I am not getting an answer.  She reports that [name] is on her way to the hospital.  Home Medications Prior to Admission medications   Medication Sig Start Date End Date Taking? Authorizing Provider  naproxen (NAPROSYN) 500 MG tablet Take 1 tablet (500 mg total) by mouth 2 (two) times daily with a meal. 03/05/21  Yes Noemi Chapel, MD  acetaminophen-codeine (TYLENOL #3) 300-30 MG tablet Take 1 tablet by mouth 2 (two) times daily.    [provider]  Ascorbic Acid (VITAMIN C) 1000 MG tablet Take 1,000 mg by mouth daily.    [provider]  aspirin EC 81 MG tablet Take 81 mg by mouth daily.    [provider]  calcitRIOL (ROCALTROL) 0.25 MCG capsule Take 0.25 mcg by mouth 3 (three) times a week. [place], [place]. Friday 01/09/21   [provider]  cetirizine (ZYRTEC) 10 MG tablet Take 10 mg by mouth daily.    [provider]  cholecalciferol (VITAMIN D3) 25 MCG (1000 UNIT) tablet Take 1,000 Units by mouth daily.     [provider]  cloNIDine (CATAPRES) 0.1 MG tablet Take 0.1 mg by mouth daily. For blood pressure    [provider]  diltiazem (TIAZAC) 360 MG 24 hr capsule Take 360 mg by mouth daily.    [provider]  erythromycin (E-MYCIN) 250 MG tablet Take 250 mg by mouth 3 (three) times daily. 01/09/21   [provider]  fluticasone (FLONASE) 50 MCG/ACT nasal spray Place 1 spray into both nostrils daily.    [provider]  furosemide (LASIX) 20 MG tablet Take 20 mg by mouth 2 (two) times daily.    [provider]  HUMALOG MIX 50/50 KWIKPEN (50-50) 100 UNIT/ML Kwikpen Take 20-40 Units by mouth See admin instructions. 40 units in AM and 20 units in PM ( if CBG is < 140 in the AM , no units given) 06/01/14   [provider]  ketoconazole (NIZORAL) 2 % cream Apply 1 application topically daily. 02/05/21   Jaynee Eagles, PA-C  lisinopril-hydrochlorothiazide (ZESTORETIC) 20-12.5 MG tablet Take 0.25 tablets by mouth 3 (three) times a week. Mon, Wed, Fri 01/09/21   [provider]  Menthol 5 % PTCH Apply 1 patch topically daily as needed (muscle pain).    [provider]  olopatadine (PATANOL) 0.1 % ophthalmic solution Place 1 drop into both eyes  daily as needed for allergies.     [provider]  omeprazole (PRILOSEC) 20 MG capsule Take 1 capsule (20 mg total) by mouth 2 (two) times daily before a meal. 01/23/21   Erenest Rasher, PA-C  potassium chloride SA (K-DUR,KLOR-CON) 20 MEQ tablet Take 20 mEq by mouth daily.    [provider]  risedronate (ACTONEL) 150 MG tablet Take 150 mg by mouth every 30 (thirty) days. with water on empty stomach, nothing by mouth or lie down for next 30 minutes.    [provider]  simvastatin (ZOCOR) 5 MG tablet Take 5 mg by mouth at bedtime. 06/01/14   [provider]      Allergies    Reglan [metoclopramide]    Review of Systems   Review of Systems  Musculoskeletal:   Positive for back pain.  All other systems reviewed and are negative.  Physical Exam Updated Vital Signs BP (!) 165/83    Pulse 82    Temp 98.9 F (37.2 C) (Oral)    Resp 17    Ht 1.651 m (5\' 5" )    Wt 77 kg    SpO2 98%    BMI 28.25 kg/m  Physical Exam Vitals and nursing note reviewed.  Constitutional:      General: She is not in acute distress.    Appearance: She is well-developed.  HENT:     Head: Normocephalic and atraumatic.     Mouth/Throat:     Pharynx: No oropharyngeal exudate.  Eyes:     General: No scleral icterus.       Right eye: No discharge.        Left eye: No discharge.     Conjunctiva/sclera: Conjunctivae normal.     Pupils: Pupils are equal, round, and reactive to light.  Neck:     Thyroid: No thyromegaly.     Vascular: No JVD.  Cardiovascular:     Rate and Rhythm: Normal rate and regular rhythm.     Heart sounds: Normal heart sounds. No murmur heard.   No friction rub. No gallop.  Pulmonary:     Effort: Pulmonary effort is normal. No respiratory distress.     Breath sounds: Normal breath sounds. No wheezing or rales.  Abdominal:     General: Bowel sounds are normal. There is no distension.     Palpations: Abdomen is soft. There is no mass.     Tenderness: There is no abdominal tenderness.  Musculoskeletal:        General: Swelling and tenderness present. Normal range of motion.     Cervical back: Normal range of motion and neck supple.     Right lower leg: No edema.     Left lower leg: No edema.     Comments: Mild bilateral swelling of the knees, there is tenderness in the mid back in the paraspinal muscles as well as the mid spine.  There is no redness to the skin around these areas, there is decreased range of motion of the bilateral knees secondary to pain, she is able to move her bilateral shoulders elbows and hands through full range of motion as well as the right leg.  Lymphadenopathy:     Cervical: No cervical adenopathy.  Skin:    General: Skin  is warm and dry.     Findings: No erythema or rash.  Neurological:     General: No focal deficit present.     Mental Status: She is alert.  Coordination: Coordination normal.  Psychiatric:        Behavior: Behavior normal.    ED Results / Procedures / Treatments   Labs (all labs ordered are listed, but only abnormal results are displayed) Labs Reviewed  SYNOVIAL CELL COUNT + DIFF, W/ CRYSTALS - Abnormal; Notable for the following components:      Result Value   Color, Synovial YELLOW (*)    Appearance-Synovial CLOUDY (*)    WBC, Synovial 6,865 (*)    Neutrophil, Synovial 76 (*)    Monocyte-Macrophage-Synovial Fluid 24 (*)    All other components within normal limits  GRAM STAIN  CULTURE, BODY FLUID W GRAM STAIN -BOTTLE    EKG None  Radiology DG Knee Complete 4 Views Left  Result Date: 03/05/2021 CLINICAL DATA:  Left knee swelling and pain EXAM: LEFT KNEE - COMPLETE 4+ VIEW COMPARISON:  None. FINDINGS: No acute fracture or dislocation identified. Mild tricompartmental joint space narrowing and evidence of chondrocalcinosis. Tricompartmental marginal osteophytes including large suprapatellar osteophyte/calcification. Evidence of joint effusion. IMPRESSION: Joint effusion and chronic degenerative changes. Electronically Signed   By: Ofilia Neas M.D.   On: 03/05/2021 11:43    Procedures .Joint Aspiration/Arthrocentesis  Date/Time: 03/05/2021 12:08 PM Performed by: Noemi Chapel, MD Authorized by: Noemi Chapel, MD   Consent:    Consent obtained:  Verbal   Consent given by:  Patient   Risks, benefits, and alternatives were discussed: yes     Risks discussed:  Bleeding, infection, pain, incomplete drainage and nerve damage   Alternatives discussed:  No treatment Universal protocol:    Procedure explained and questions answered to patient or proxy's satisfaction: yes     Relevant documents present and verified: yes     Test results available: yes     Imaging studies  available: yes     Required blood products, implants, devices, and special equipment available: yes     Site/side marked: yes     Immediately prior to procedure, a time out was called: yes     Patient identity confirmed:  Verbally with patient Location:    Location:  Knee   Knee:  L knee Anesthesia:    Anesthesia method:  Local infiltration   Local anesthetic:  Lidocaine 1% w/o epi Procedure details:    Preparation: Patient was prepped and draped in usual sterile fashion     Needle gauge:  18 G   Ultrasound guidance: no     Approach:  Lateral   Aspirate amount:  105   Aspirate characteristics:  Yellow   Steroid injected: no     Specimen collected: yes   Post-procedure details:    Dressing:  Adhesive bandage and sterile dressing   Procedure completion:  Tolerated well, no immediate complications Comments:          Medications Ordered in ED Medications  lidocaine (PF) (XYLOCAINE) 1 % injection 5 mL (5 mLs Intradermal Given by Other 03/05/21 1216)    ED Course/ Medical Decision Making/ A&P                           Medical Decision Making Amount and/or Complexity of Data Reviewed Labs: ordered. Radiology: ordered.  Risk Prescription drug management.   This patient presents to the ED for concern of back and knee pain, this involves an extensive number of treatment options, and is a complaint that carries with it a high risk of complications and morbidity.  The differential diagnosis includes arthritis,  acute gout, myalgias secondary to statin use   Co morbidities that complicate the patient evaluation  Elderly, chronic pain, diabetes, hypertension   Additional history obtained:  Additional history obtained from electronic medical record External records from outside source obtained and reviewed including prior physical medicine and rehabilitation notes dating back to September 2022 when the patient was followed by the rehabilitation clinic for her muscle pain I was  able to get hold of the daughter who came to see the patient in the hospital, she states that over the last several days there has been a progressive swelling of her left knee, this is to the point where it is difficult for her to stand and mobilize because of the pain in the knee itself.  She has never had anything like this before, she has chronic scoliosis and pain of the rest of her spine but the knee pain is the one that is new.   Lab Tests:  I Ordered, and personally interpreted labs.  The pertinent results include: Arthrocentesis fluid looking for crystals and bacteria, cell counts in the synovium suggest this is not a bacterial cause, there is no crystals   Imaging Studies ordered:  I ordered imaging studies including x-ray shows knee effusion I independently visualized and interpreted imaging which showed effusion and no fracture, arthritis present I agree with the radiologist interpretation   Cardiac Monitoring:  The patient was maintained on a cardiac monitor.  I personally viewed and interpreted the cardiac monitored which showed an underlying rhythm of: Normal sinus rhythm, no tachycardia   Medicines ordered and prescription drug management:  I ordered medication including lidocaine for local anesthesia for improvement Reevaluation of the patient after these medicines showed that the patient improved I have reviewed the patients home medicines and have made adjustments as needed   Test Considered:  CT scan of the knee though this seems unlikely to be necessary given no trauma and no fracture on plain film   Critical Interventions:  Arthrocentesis to rule out septic joint   Problem List / ED Course:  This patient improved significantly after arthrocentesis, she had an Ace wrap and a bandage placed, she will be placed on an anti-inflammatory and sent to follow-up with orthopedics, patient and family agreeable    Social Determinants of  Health:  None           Final Clinical Impression(s) / ED Diagnoses Final diagnoses:  Knee effusion, left    Rx / DC Orders ED Discharge Orders          Ordered    naproxen (NAPROSYN) 500 MG tablet  2 times daily with meals        03/05/21 1533              Noemi Chapel, MD 03/05/21 1535

## 2021-03-10 LAB — CULTURE, BODY FLUID W GRAM STAIN -BOTTLE
Culture: NO GROWTH
Special Requests: ADEQUATE

## 2021-03-28 ENCOUNTER — Ambulatory Visit
Admission: EM | Admit: 2021-03-28 | Discharge: 2021-03-28 | Disposition: A | Discharge status: Home / Self Care | Payer: 59 | Attending: Student | Admitting: Student

## 2021-03-28 ENCOUNTER — Other Ambulatory Visit: Payer: Self-pay

## 2021-03-28 DIAGNOSIS — H6123 Impacted cerumen, bilateral: Secondary | ICD-10-CM | POA: Diagnosis not present

## 2021-03-28 NOTE — ED Provider Notes (Signed)
?[place] [facility] ? ? ? ?CSN: [id number] ?Arrival date & time: 03/28/21  1243 ? ? ?  ? ?History   ?Chief Complaint ?Chief Complaint  ?Patient presents with  ? Ear Fullness  ? ? ?HPI ?[name] is a 81 y.o. female presenting with impacted cerumen.  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 ? ?HPI ? ?Past Medical History:  ?Diagnosis Date  ? CKD (chronic kidney disease)   ? Diabetes mellitus   ? Diastolic heart failure ([place])   ? GERD (gastroesophageal reflux disease)   ? Hypertension   ? Parkinson disease ([place])   ? Scoliosis   ? Scoliosis   ? UTI (lower urinary tract infection)   ? ? ?Patient Active Problem List  ? Diagnosis Date Noted  ? Nausea and vomiting 01/23/2021  ? Esophageal dysphagia 08/22/2020  ? Gastroesophageal reflux disease 08/22/2020  ? Syncope 10/12/2014  ? Diabetes ([place]) 10/12/2014  ? Hypertension 10/12/2014  ? 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 TRIGGER FINGER 04/27/2007  ? ? ?Past Surgical History:  ?Procedure Laterality Date  ? BALLOON DILATION N/A 02/21/2021  ? Procedure: BALLOON DILATION;  Surgeon: Eloise Harman, DO;  [facility];  Service: Endoscopy;  Laterality: N/A;  ? BIOPSY  02/21/2021  ? Procedure: BIOPSY;  Surgeon: Eloise Harman, DO;  Location: [facility];  Service: Endoscopy;;  ? boil Left   ? boil removed from left cheek  ? ESOPHAGOGASTRODUODENOSCOPY (EGD) WITH PROPOFOL N/A 02/21/2021  ? Procedure: ESOPHAGOGASTRODUODENOSCOPY (EGD) WITH PROPOFOL;  Surgeon: Eloise Harman, DO;  Location: [facility];  Service: Endoscopy;  Laterality: N/A;  11:00am  ? EYE SURGERY    ? Retina Detachment  ? MASS EXCISION N/A 06/27/2014  ? Procedure: EXCISION SUBMANDIBULAR STONE;  Surgeon: Izora Gala, MD;  Location: [facility] OR;  Service: ENT;  Laterality: N/A;  ? sublingual gland removal  2016  ? tumor removed from rib    ? ? ?OB History   ?No obstetric history on file. ?   ? ? ? ?Home Medications   ? ?Prior to Admission medications   ?Medication Sig Start Date End Date Taking? Authorizing Provider  ?acetaminophen-codeine (TYLENOL #3) 300-30 MG tablet Take 1 tablet by mouth 2 (two) times daily.    [provider]  ?Ascorbic Acid (VITAMIN C) 1000 MG tablet Take 1,000 mg by mouth daily.    [provider]  ?aspirin EC 81 MG tablet Take 81 mg by mouth daily.    [provider]  ?calcitRIOL (ROCALTROL) 0.25 MCG capsule Take 0.25 mcg by mouth 3 (three) times a week. [place], [place]. Friday 01/09/21   [provider]  ?cetirizine (ZYRTEC) 10 MG tablet Take 10 mg by mouth daily.    [provider]  ?cholecalciferol (VITAMIN D3) 25 MCG (1000 UNIT) tablet Take 1,000 Units by mouth daily.    [provider]  ?cloNIDine (CATAPRES) 0.1 MG tablet Take 0.1 mg by mouth daily. For blood pressure    [provider]  ?diltiazem (TIAZAC) 360 MG 24 hr capsule Take 360 mg by mouth daily.    [provider]  ?erythromycin (E-MYCIN) 250 MG tablet Take 250 mg by mouth 3 (three) times daily. 01/09/21   [provider]  ?fluticasone (FLONASE) 50 MCG/ACT nasal spray Place 1 spray into both nostrils daily.    [provider]  ?furosemide (LASIX) 20  MG tablet Take 20 mg by mouth 2 (two) times daily.    [provider]  ?HUMALOG MIX 50/50 KWIKPEN (50-50) 100 UNIT/ML Kwikpen Take 20-40 Units by mouth See admin instructions. 40 units in AM and 20 units in PM ( if CBG is < 140 in the AM , no units given) 06/01/14   [provider]  ?ketoconazole (NIZORAL) 2 % cream Apply 1 application topically daily. 02/05/21   Jaynee Eagles, PA-C  ?lisinopril-hydrochlorothiazide (ZESTORETIC) 20-12.5 MG tablet Take 0.25 tablets by mouth 3 (three) times a week. Mon, Wed, Fri 01/09/21   [provider]  ?Menthol 5 % PTCH Apply 1 patch topically daily as needed (muscle pain).    [provider]  ?naproxen (NAPROSYN) 500 MG tablet  Take 1 tablet (500 mg total) by mouth 2 (two) times daily with a meal. 03/05/21   Noemi Chapel, MD  ?olopatadine (PATANOL) 0.1 % ophthalmic solution Place 1 drop into both eyes daily as needed for allergies.     [provider]  ?omeprazole (PRILOSEC) 20 MG capsule Take 1 capsule (20 mg total) by mouth 2 (two) times daily before a meal. 01/23/21   Erenest Rasher, PA-C  ?potassium chloride SA (K-DUR,KLOR-CON) 20 MEQ tablet Take 20 mEq by mouth daily.    [provider]  ?risedronate (ACTONEL) 150 MG tablet Take 150 mg by mouth every 30 (thirty) days. with water on empty stomach, nothing by mouth or lie down for next 30 minutes.    [provider]  ?simvastatin (ZOCOR) 5 MG tablet Take 5 mg by mouth at bedtime. 06/01/14   [provider]  ? ? ?Family History ?Family History  ?Problem Relation Age of Onset  ? Colon cancer Maternal Grandmother   ?     62  ? ? ?Social History ?Social History  ? ?Tobacco Use  ? Smoking status: Never  ? Smokeless tobacco: Never  ?Vaping Use  ? Vaping Use: Never used  ?Substance Use Topics  ? Alcohol use: No  ?  Alcohol/week: 0.0 standard drinks  ? Drug use: No  ? ? ? ?Allergies   ?Reglan [metoclopramide] ? ? ?Review of Systems ?Review of Systems  ?HENT:  Positive for hearing loss.   ?All other systems reviewed and are negative. ? ? ?Physical Exam ?Triage Vital Signs ?ED Triage Vitals [03/28/21 1251]  ?Enc Vitals Group  ?   BP   ?   Pulse   ?   Resp   ?   Temp   ?   Temp src   ?   SpO2   ?   Weight   ?   Height   ?   Head Circumference   ?   Peak Flow   ?   Pain Score 5  ?   Pain Loc   ?   Pain Edu?   ?   Excl. in [place]?   ? ?No data found. ? ?Updated Vital Signs ?There were no vitals taken for this visit. ? ?Visual Acuity ?Right Eye Distance:   ?Left Eye Distance:   ?Bilateral Distance:   ? ?Right Eye Near:   ?Left Eye Near:    ?Bilateral Near:    ? ?Physical Exam ?Vitals reviewed.  ?Constitutional:   ?   General: She is not in acute distress. ?    Appearance: Normal appearance. She is not ill-appearing.  ?HENT:  ?   Head: Normocephalic and atraumatic.  ?   Right Ear: There is impacted cerumen.  ?  Left Ear: There is impacted cerumen.  ?Pulmonary:  ?   Effort: Pulmonary effort is normal.  ?Neurological:  ?   General: No focal deficit present.  ?   Mental Status: She is alert and oriented to person, place, and time.  ?Psychiatric:     ?   Mood and Affect: Mood normal.     ?   Behavior: Behavior normal.     ?   Thought Content: Thought content normal.     ?   Judgment: Judgment normal.  ? ? ? ?UC Treatments / Results  ?Labs ?(all labs ordered are listed, but only abnormal results are displayed) ?Labs Reviewed - No data to display ? ?EKG ? ? ?Radiology ?No results found. ? ?Procedures ?Procedures (including critical care time) ? ?Medications Ordered in UC ?Medications - No data to display ? ?Initial Impression / Assessment and Plan / UC Course  ?I have reviewed the triage vital signs and the nursing notes. ? ?Pertinent labs & imaging results that were available during my care of the patient were reviewed by me and considered in my medical decision making (see chart for details). ? ?  ? ?This patient is a very pleasant 81 y.o. year old female presenting with impacted cerumen. Afebrile, nontachy.  Lavage was performed by nurse; patient experienced discomfort during the procedure and this was terminated after brief attempt.  I then attempted to use curette, and was only able to get a small amount of cerumen out before this also became too uncomfortable.  I was not able to visualize the tympanic membrane, but there was no damage to the outer ear during either procedure.  Recommended Debrox drops over-the-counter, and follow-up with ENT for further management.  Patient and family member are in agreement..  ? ?Final Clinical Impressions(s) / UC Diagnoses  ? ?Final diagnoses:  ?Bilateral impacted cerumen  ? ? ? ?Discharge Instructions   ? ?  ?-Purchase some Debrox  solution over-the-counter. Let the solution sit in the ear for 1-2 minutes and then you can attempt to flush at home. You can do this multiple days in a row if needed.  ?-Follow-up with an Ear Nose and Throat doctor

## 2021-03-28 NOTE — ED Triage Notes (Signed)
Pt states she came from getting set up for hearing aides and they told her that her ears had too much wax in both ears  ?

## 2021-03-28 NOTE — Discharge Instructions (Addendum)
-  Purchase some Debrox solution over-the-counter. Let the solution sit in the ear for 1-2 minutes and then you can attempt to flush at home. You can do this multiple days in a row if needed.  ?-Follow-up with an Ear Nose and Throat doctor for ear wax removal if symptoms persist.  ?

## 2021-03-28 NOTE — Consult Note (Signed)
Pt was unable to handle ear lavage to clean out both ears. Pt stated that it was too painful. Physician was notified and lavage was stopped. ?

## 2021-04-25 ENCOUNTER — Other Ambulatory Visit: Payer: Self-pay | Admitting: Gastroenterology

## 2021-04-25 DIAGNOSIS — K219 Gastro-esophageal reflux disease without esophagitis: Secondary | ICD-10-CM

## 2021-04-25 DIAGNOSIS — R112 Nausea with vomiting, unspecified: Secondary | ICD-10-CM

## 2021-05-22 ENCOUNTER — Encounter: Payer: Self-pay | Admitting: Internal Medicine

## 2021-06-04 ENCOUNTER — Emergency Department (HOSPITAL_COMMUNITY)
Admission: EM | Admit: 2021-06-04 | Discharge: 2021-06-04 | Disposition: A | Discharge status: Home / Self Care | Payer: 59 | Attending: Emergency Medicine | Admitting: Emergency Medicine

## 2021-06-04 ENCOUNTER — Other Ambulatory Visit: Payer: Self-pay

## 2021-06-04 ENCOUNTER — Emergency Department (HOSPITAL_COMMUNITY): Payer: 59

## 2021-06-04 ENCOUNTER — Encounter (HOSPITAL_COMMUNITY): Payer: Self-pay | Admitting: *Deleted

## 2021-06-04 DIAGNOSIS — Z7982 Long term (current) use of aspirin: Secondary | ICD-10-CM | POA: Diagnosis not present

## 2021-06-04 DIAGNOSIS — Q444 Choledochal cyst: Secondary | ICD-10-CM | POA: Diagnosis not present

## 2021-06-04 DIAGNOSIS — R739 Hyperglycemia, unspecified: Secondary | ICD-10-CM | POA: Diagnosis not present

## 2021-06-04 DIAGNOSIS — R197 Diarrhea, unspecified: Secondary | ICD-10-CM | POA: Diagnosis not present

## 2021-06-04 DIAGNOSIS — N83209 Unspecified ovarian cyst, unspecified side: Secondary | ICD-10-CM | POA: Diagnosis not present

## 2021-06-04 DIAGNOSIS — N281 Cyst of kidney, acquired: Secondary | ICD-10-CM | POA: Diagnosis not present

## 2021-06-04 LAB — COMPREHENSIVE METABOLIC PANEL
ALT: 10 U/L (ref 0–44)
AST: 13 U/L — ABNORMAL LOW (ref 15–41)
Albumin: 2.4 g/dL — ABNORMAL LOW (ref 3.5–5.0)
Alkaline Phosphatase: 93 U/L (ref 38–126)
Anion gap: 8 (ref 5–15)
BUN: 24 mg/dL — ABNORMAL HIGH (ref 8–23)
CO2: 27 mmol/L (ref 22–32)
Calcium: 8.7 mg/dL — ABNORMAL LOW (ref 8.9–10.3)
Chloride: 100 mmol/L (ref 98–111)
Creatinine, Ser: 1.35 mg/dL — ABNORMAL HIGH (ref 0.44–1.00)
GFR, Estimated: 40 mL/min — ABNORMAL LOW (ref >60)
Glucose, Bld: 451 mg/dL — ABNORMAL HIGH (ref 70–99)
Potassium: 3.4 mmol/L — ABNORMAL LOW (ref 3.5–5.1)
Sodium: 135 mmol/L (ref 135–145)
Total Bilirubin: 0.6 mg/dL (ref 0.3–1.2)
Total Protein: 6.3 g/dL — ABNORMAL LOW (ref 6.5–8.1)

## 2021-06-04 LAB — CBC WITH DIFFERENTIAL/PLATELET
Abs Immature Granulocytes: 0.06 10*3/uL (ref 0.00–0.07)
Basophils Absolute: 0 10*3/uL (ref 0.0–0.1)
Basophils Relative: 0 %
Eosinophils Absolute: 0 10*3/uL (ref 0.0–0.5)
Eosinophils Relative: 0 %
HCT: 36.4 % (ref 36.0–46.0)
Hemoglobin: 11.7 g/dL — ABNORMAL LOW (ref 12.0–15.0)
Immature Granulocytes: 1 %
Lymphocytes Relative: 10 %
Lymphs Abs: 1 10*3/uL (ref 0.7–4.0)
MCH: 25.3 pg — ABNORMAL LOW (ref 26.0–34.0)
MCHC: 32.1 g/dL (ref 30.0–36.0)
MCV: 78.8 fL — ABNORMAL LOW (ref 80.0–100.0)
Monocytes Absolute: 0.6 10*3/uL (ref 0.1–1.0)
Monocytes Relative: 7 %
Neutro Abs: 7.9 10*3/uL — ABNORMAL HIGH (ref 1.7–7.7)
Neutrophils Relative %: 82 %
Platelets: 292 10*3/uL (ref 150–400)
RBC: 4.62 MIL/uL (ref 3.87–5.11)
RDW: 15.7 % — ABNORMAL HIGH (ref 11.5–15.5)
WBC: 9.7 10*3/uL (ref 4.0–10.5)
nRBC: 0 % (ref 0.0–0.2)

## 2021-06-04 LAB — URINALYSIS, ROUTINE W REFLEX MICROSCOPIC
Bilirubin Urine: NEGATIVE
Glucose, UA: >=500 mg/dL — AB
Hgb urine dipstick: NEGATIVE
Ketones, ur: NEGATIVE mg/dL
Leukocytes,Ua: NEGATIVE
Nitrite: NEGATIVE
Protein, ur: NEGATIVE mg/dL
Specific Gravity, Urine: 1.006 (ref 1.005–1.030)
pH: 7 (ref 5.0–8.0)

## 2021-06-04 LAB — CBG MONITORING, ED
Glucose-Capillary: 428 mg/dL — ABNORMAL HIGH (ref 70–99)
Glucose-Capillary: 324 mg/dL — ABNORMAL HIGH (ref 70–99)

## 2021-06-04 LAB — LIPASE, BLOOD: Lipase: 25 U/L (ref 11–51)

## 2021-06-04 MED ORDER — ONDANSETRON HCL 4 MG/2ML IJ SOLN
4.0000 mg | Freq: Once | INTRAMUSCULAR | Status: AC
  Administered 2021-06-04: 4 mg via INTRAVENOUS
  Filled 2021-06-04: qty 2

## 2021-06-04 MED ORDER — IOHEXOL 300 MG/ML  SOLN
100.0000 mL | Freq: Once | INTRAMUSCULAR | Status: AC | PRN
  Administered 2021-06-04: 80 mL via INTRAVENOUS

## 2021-06-04 MED ORDER — INSULIN ASPART 100 UNIT/ML IJ SOLN
6.0000 [IU] | Freq: Once | INTRAMUSCULAR | Status: AC
Start: 2021-06-04 — End: 2021-06-04
  Administered 2021-06-04: 6 [IU] via SUBCUTANEOUS
  Filled 2021-06-04: qty 1

## 2021-06-04 MED ORDER — LOPERAMIDE HCL 2 MG PO CAPS: 2.0000 mg | ORAL_CAPSULE | Freq: Four times a day (QID) | ORAL | 0 refills | Status: DC | PRN

## 2021-06-04 MED ORDER — SODIUM CHLORIDE 0.9 % IV SOLN: INTRAVENOUS | Status: DC

## 2021-06-04 MED ORDER — SODIUM CHLORIDE 0.9 % IV BOLUS
500.0000 mL | Freq: Once | INTRAVENOUS | Status: AC
  Administered 2021-06-04: 500 mL via INTRAVENOUS

## 2021-06-04 NOTE — ED Provider Notes (Addendum)
[facility] EMERGENCY DEPARTMENT Provider Note   CSN: [id number] Arrival date & time: 06/04/21  1457     History  Chief Complaint  Patient presents with   Hyperglycemia    [name] is a 81 y.o. female.   Hyperglycemia Associated symptoms: no fever    Patient presents to the ED with complaints of diarrhea and elevated blood sugar today.  Patient states she was not feeling well today.  She did not take her diabetes medications.  Her blood sugar was elevated and family noted to be as high as 439.  Patient states she has been having generalized abdominal discomfort associated with diarrhea that started today.  No recent antibiotics.  No vomiting.  No fevers.  No dysuria.  Home Medications Prior to Admission medications   Medication Sig Start Date End Date Taking? Authorizing Provider  loperamide (IMODIUM) 2 MG capsule Take 1 capsule (2 mg total) by mouth 4 (four) times daily as needed for diarrhea or loose stools. 06/04/21  Yes Dorie Rank, MD  acetaminophen-codeine (TYLENOL #3) 300-30 MG tablet Take 1 tablet by mouth 2 (two) times daily.    [provider]  Ascorbic Acid (VITAMIN C) 1000 MG tablet Take 1,000 mg by mouth daily.    [provider]  aspirin EC 81 MG tablet Take 81 mg by mouth daily.    [provider]  calcitRIOL (ROCALTROL) 0.25 MCG capsule Take 0.25 mcg by mouth 3 (three) times a week. [place], [place]. Friday 01/09/21   [provider]  cetirizine (ZYRTEC) 10 MG tablet Take 10 mg by mouth daily.    [provider]  cholecalciferol (VITAMIN D3) 25 MCG (1000 UNIT) tablet Take 1,000 Units by mouth daily.    [provider]  cloNIDine (CATAPRES) 0.1 MG tablet Take 0.1 mg by mouth daily. For blood pressure    [provider]  diltiazem (TIAZAC) 360 MG 24 hr capsule Take 360 mg by mouth daily.    [provider]  erythromycin (E-MYCIN) 250 MG tablet Take 250 mg by mouth 3 (three) times daily. 01/09/21   [provider]  fluticasone (FLONASE) 50 MCG/ACT nasal spray Place 1 spray into both nostrils daily.    [provider]  furosemide (LASIX) 20 MG tablet Take 20 mg by mouth 2 (two) times daily.    [provider]  HUMALOG MIX 50/50 KWIKPEN (50-50) 100 UNIT/ML Kwikpen Take 20-40 Units by mouth See admin instructions. 40 units in AM and 20 units in PM ( if CBG is < 140 in the AM , no units given) 06/01/14   [provider]  ketoconazole (NIZORAL) 2 % cream Apply 1 application topically daily. 02/05/21   Jaynee Eagles, PA-C  lisinopril-hydrochlorothiazide (ZESTORETIC) 20-12.5 MG tablet Take 0.25 tablets by mouth 3 (three) times a week. Mon, Wed, Fri 01/09/21   [provider]  Menthol 5 % PTCH Apply 1 patch topically daily as needed (muscle pain).    [provider]  naproxen (NAPROSYN) 500 MG tablet Take 1 tablet (500 mg total) by mouth 2 (two) times daily with a meal. 03/05/21   Noemi Chapel, MD  olopatadine (PATANOL) 0.1 % ophthalmic solution Place 1 drop into both eyes daily as needed for allergies.     [provider]  omeprazole (PRILOSEC) 20 MG capsule TAKE 1 CAPSULE BY MOUTH TWICE DAILY BEFORE A MEAL. 04/25/21   Aliene Altes S, PA-C  potassium chloride SA (K-DUR,KLOR-CON) 20 MEQ tablet Take 20 mEq by mouth  daily.    [provider]  risedronate (ACTONEL) 150 MG tablet Take 150 mg by mouth every 30 (thirty) days. with water on empty stomach, nothing by mouth or lie down for next 30 minutes.    [provider]  simvastatin (ZOCOR) 5 MG tablet Take 5 mg by mouth at bedtime. 06/01/14   [provider]      Allergies    Reglan [metoclopramide]    Review of Systems   Review of Systems  Constitutional:  Negative for fever.   Physical Exam Updated Vital Signs BP (!) 143/84   Pulse 84   Temp 98.1 F (36.7 C) (Oral)   Resp 18   Ht 1.626 m ('5\' 4"'$ )   Wt 77 kg   SpO2 100%   BMI 29.14 kg/m  Physical Exam Vitals and  nursing note reviewed.  Constitutional:      Appearance: She is well-developed. She is not diaphoretic.  HENT:     Head: Normocephalic and atraumatic.     Right Ear: External ear normal.     Left Ear: External ear normal.  Eyes:     General: No scleral icterus.       Right eye: No discharge.        Left eye: No discharge.     Conjunctiva/sclera: Conjunctivae normal.  Neck:     Trachea: No tracheal deviation.  Cardiovascular:     Rate and Rhythm: Normal rate and regular rhythm.  Pulmonary:     Effort: Pulmonary effort is normal. No respiratory distress.     Breath sounds: Normal breath sounds. No stridor. No wheezing or rales.  Abdominal:     General: Bowel sounds are normal. There is no distension.     Palpations: Abdomen is soft.     Tenderness: There is abdominal tenderness. There is no guarding or rebound.     Comments: Mild generalized tenderness  Musculoskeletal:        General: No tenderness or deformity.     Cervical back: Neck supple.  Skin:    General: Skin is warm and dry.     Findings: No rash.  Neurological:     General: No focal deficit present.     Mental Status: She is alert.     Cranial Nerves: No cranial nerve deficit (no facial droop, extraocular movements intact, no slurred speech).     Sensory: No sensory deficit.     Motor: No abnormal muscle tone or seizure activity.     Coordination: Coordination normal.  Psychiatric:        Mood and Affect: Mood normal.    ED Results / Procedures / Treatments   Labs (all labs ordered are listed, but only abnormal results are displayed) Labs Reviewed  COMPREHENSIVE METABOLIC PANEL - Abnormal; Notable for the following components:      Result Value   Potassium 3.4 (*)    Glucose, Bld 451 (*)    BUN 24 (*)    Creatinine, Ser 1.35 (*)    Calcium 8.7 (*)    Total Protein 6.3 (*)    Albumin 2.4 (*)    AST 13 (*)    GFR, Estimated 40 (*)    All other components within normal limits  CBC WITH  DIFFERENTIAL/PLATELET - Abnormal; Notable for the following components:   Hemoglobin 11.7 (*)    MCV 78.8 (*)    MCH 25.3 (*)    RDW 15.7 (*)    Neutro Abs 7.9 (*)  All other components within normal limits  URINALYSIS, ROUTINE W REFLEX MICROSCOPIC - Abnormal; Notable for the following components:   Color, Urine STRAW (*)    Glucose, UA >=500 (*)    Bacteria, UA RARE (*)    All other components within normal limits  CBG MONITORING, ED - Abnormal; Notable for the following components:   Glucose-Capillary 428 (*)    All other components within normal limits  CBG MONITORING, ED - Abnormal; Notable for the following components:   Glucose-Capillary 324 (*)    All other components within normal limits  LIPASE, BLOOD    EKG None  Radiology CT ABDOMEN PELVIS W CONTRAST  Result Date: 06/04/2021 CLINICAL DATA:  LLQ abdominal pain EXAM: CT ABDOMEN AND PELVIS WITH CONTRAST TECHNIQUE: Multidetector CT imaging of the abdomen and pelvis was performed using the standard protocol following bolus administration of intravenous contrast. RADIATION DOSE REDUCTION: This exam was performed according to the departmental dose-optimization program which includes automated exposure control, adjustment of the mA and/or kV according to patient size and/or use of iterative reconstruction technique. CONTRAST:  64m OMNIPAQUE IOHEXOL 300 MG/ML  SOLN COMPARISON:  CT abdomen pelvis 05/14/2008, esophagram/barium swallow 08/26/2020, MRI abdomen 08/26/2010, ultrasound renal 08/12/2010 FINDINGS: Lower chest: Large hiatal hernia containing the majority of the stomach. Hepatobiliary: No focal liver abnormality. No gallstones, gallbladder wall thickening, or pericholecystic fluid. Enlarged common bile duct measuring up to 1.5 cm. Enlarged intrahepatic biliary ducts measuring up to 6 mm. Pancreas: Diffusely atrophic. No focal lesion. Otherwise normal pancreatic contour. No surrounding inflammatory changes. No main pancreatic  ductal dilatation. Spleen: Normal in size without focal abnormality. Adrenals/Urinary Tract: No adrenal nodule bilaterally. Bilateral renal cortical scarring. Bilateral kidneys enhance symmetrically. Multiple subcentimeter hypodensities are too small to characterize. There is a lobulated 2.4 x 2.7 cm right renal lesion with a density of 41 Hounsfield units. There is a 1.3 cm fluid density lesion within the left kidney that statistically likely represents a simple renal cyst. Simple renal cysts, in the absence of clinically indicated signs/symptoms, require no independent follow-up. No hydronephrosis. No hydroureter. The urinary bladder is unremarkable. Stomach/Bowel: Stomach is within normal limits. No evidence of bowel wall thickening or dilatation. Scattered colonic diverticulosis. The appendix is not definitely identified with no inflammatory changes in the right lower quadrant to suggest acute appendicitis. Vascular/Lymphatic: The inferior vena cava caliber at the level of a liver is decreased to 0.8 cm due to mass effect from the cystic lesion described below. The portal, splenic, superior mesenteric veins are patent. No abdominal aorta or iliac aneurysm. Mild atherosclerotic plaque of the aorta and its branches. No abdominal, pelvic, or inguinal lymphadenopathy. Reproductive: Uterus is unremarkable. There is a 6.8 x 5.4 cm left ovarian cystic lesion. The right adnexa is unremarkable. Other: Multi-septated 7.8 x 6.1 cm cystic lesion within the upper quadrant abutting and compressing the inferior vena cava. This lesion is also noted to abut the caudate lobe and the common bile duct. Associated nodularity within the lesion is noted (2:21). Associated calcification is noted (2:14). No intraperitoneal free fluid. No intraperitoneal free gas. No organized fluid collection. Musculoskeletal: No abdominal wall hernia or abnormality. No suspicious lytic or blastic osseous lesions. No acute displaced fracture. Multilevel  degenerative changes of the spine most prominent at the L4 through S1 levels. Multilevel intervertebral disc space vacuum phenomenon. Pubic symphysis degenerative changes. IMPRESSION: 1. Indeterminate 2.7 cm right renal lesion. This finding appears different from prior hemorrhagic cyst noted in 2012. Underlying malignancy not excluded. Recommend  MRI renal protocol for further evaluation. 2. Indeterminate slightly heterogeneous multi-septated 7.8 x 6.1 cm cystic lesion within the upper quadrant abutting and compressing the inferior vena cava. This lesion is noted to abut the caudate lobe as well as the common bile duct. Finding could represent a choledochal cyst versus other benign/malignant lesion (certainly in the setting of a possible malignant renal lesion). Finding can be further evaluated on the MRI renal protocol. 3. A 6.8 x 5.4 cm left ovarian cystic lesion. Recommend follow-up US in 6-12 months. Note: This recommendation does not apply to premenarchal patients and to those with increased risk (genetic, family history, elevated tumor markers or other high-risk factors) of ovarian cancer. Reference: JACR 2020 Feb; 17(2):248-254 4. Enlarged intra and extrahepatic biliary ducts. Correlate with liver function tests. 5. Large hiatal hernia containing the majority of the stomach. 6.  Aortic Atherosclerosis (ICD10-I70.0). Electronically Signed   By: Iven Finn M.D.   On: 06/04/2021 19:00    Procedures Procedures    Medications Ordered in ED Medications  sodium chloride 0.9 % bolus 500 mL (0 mLs Intravenous Stopped 06/04/21 1801)    And  0.9 %  sodium chloride infusion ( Intravenous New Bag/Given 06/04/21 1647)  ondansetron (ZOFRAN) injection 4 mg (4 mg Intravenous Given 06/04/21 1649)  insulin aspart (novoLOG) injection 6 Units (6 Units Subcutaneous Given 06/04/21 1650)  iohexol (OMNIPAQUE) 300 MG/ML solution 100 mL (80 mLs Intravenous Contrast Given 06/04/21 1833)    ED Course/ Medical Decision  Making/ A&P Clinical Course as of 06/04/21 1927  Wed Jun 04, 2021  1726 CBC with Diff(!) Mild anemia noted, similar to previous values [JK]  1851 Urinalysis, Routine w reflex microscopic Urine, Clean Catch(!) Urinalysis without signs of infection [JK]  1851 Comprehensive metabolic panel(!) Metabolic panel shows stable renal insufficiency.  Hyperglycemia noted.  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 Decision-making details documented in ED Course. Radiology: ordered and independent interpretation performed.  Risk Prescription drug management.   Patient presented to the ED for evaluation of diarrhea and hyperglycemia.  Patient was not feeling well so she did not take her diabetes medications.  Consider the possibility of colitis diverticulitis, viral illness contributing to her diarrhea and malaise and hyperglycemia.  ED work-up showed hyperglycemia but no electrolyte abnormalities.  Patient was given IV fluids and insulin with some improvement.  Regarding her diarrhea and abdominal discomfort laboratory tests are otherwise reassuring.  No signs of UTI.  CT scan does not show any acute abnormalities other than incidental lesions, possible cyst noted around the gallbladder kidney and ovary.  These findings were discussed with the patient and her family member.  Recommend outpatient follow-up with PCP for further imaging as recommended by radiology.  Patient is feeling better at this time.  She does not have any  recurrent diarrhea.  She is hungry and has a good appetite.  She will resume her diabetes medications.  Evaluation and diagnostic testing in the emergency department does not suggest an emergent condition  requiring admission or immediate intervention beyond what has been performed at this time.  The patient is safe for discharge and has been instructed to return immediately for worsening symptoms, change in symptoms or any other concerns.    Final Clinical Impression(s) / ED Diagnoses Final diagnoses:  Hyperglycemia  Renal cyst  Choledochal cyst  Cyst of ovary, unspecified laterality  Diarrhea, unspecified type    Rx / DC Orders ED Discharge Orders          Ordered    loperamide (IMODIUM) 2 MG capsule  4 times daily PRN        06/04/21 Gustavus Bryant, MD 06/04/21 1928

## 2021-06-04 NOTE — Discharge Instructions (Signed)
Your blood sugar improved with insulin and IV fluids.  You can continue your home diabetes medications.  Take Imodium as needed for the diarrhea.  The CT scan showed incidental findings of a cyst around the kidney gallbladder area in the ovary.  Follow-up with your primary care doctor to arrange outpatient follow-up imaging including possible MRI and ultrasound.

## 2021-06-04 NOTE — ED Triage Notes (Signed)
Pt c/o having increased cbg readings last night and today; todays cbg 439;   Pt c/o diarrhea with abdominal pain

## 2021-06-30 ENCOUNTER — Other Ambulatory Visit: Payer: Self-pay | Admitting: Gastroenterology

## 2021-06-30 DIAGNOSIS — K219 Gastro-esophageal reflux disease without esophagitis: Secondary | ICD-10-CM

## 2021-06-30 DIAGNOSIS — R112 Nausea with vomiting, unspecified: Secondary | ICD-10-CM

## 2021-07-21 ENCOUNTER — Other Ambulatory Visit (HOSPITAL_COMMUNITY): Payer: Self-pay | Admitting: Nephrology

## 2021-07-21 ENCOUNTER — Other Ambulatory Visit: Payer: Self-pay | Admitting: Nephrology

## 2021-07-21 DIAGNOSIS — E1129 Type 2 diabetes mellitus with other diabetic kidney complication: Secondary | ICD-10-CM

## 2021-07-21 DIAGNOSIS — N281 Cyst of kidney, acquired: Secondary | ICD-10-CM

## 2021-07-21 DIAGNOSIS — E1122 Type 2 diabetes mellitus with diabetic chronic kidney disease: Secondary | ICD-10-CM

## 2021-07-21 DIAGNOSIS — I129 Hypertensive chronic kidney disease with stage 1 through stage 4 chronic kidney disease, or unspecified chronic kidney disease: Secondary | ICD-10-CM

## 2021-07-21 DIAGNOSIS — K8689 Other specified diseases of pancreas: Secondary | ICD-10-CM

## 2021-08-01 ENCOUNTER — Ambulatory Visit (HOSPITAL_COMMUNITY): Payer: 59

## 2021-08-01 ENCOUNTER — Encounter (HOSPITAL_COMMUNITY): Payer: Self-pay

## 2021-08-06 ENCOUNTER — Ambulatory Visit: Payer: 59 | Admitting: Gastroenterology

## 2021-08-17 ENCOUNTER — Ambulatory Visit
Admission: RE | Admit: 2021-08-17 | Discharge: 2021-08-17 | Disposition: A | Discharge status: Home / Self Care | Payer: 59 | Source: Ambulatory Visit | Attending: Nephrology | Admitting: Nephrology

## 2021-08-17 DIAGNOSIS — I129 Hypertensive chronic kidney disease with stage 1 through stage 4 chronic kidney disease, or unspecified chronic kidney disease: Secondary | ICD-10-CM

## 2021-08-17 DIAGNOSIS — N281 Cyst of kidney, acquired: Secondary | ICD-10-CM

## 2021-08-17 DIAGNOSIS — E1129 Type 2 diabetes mellitus with other diabetic kidney complication: Secondary | ICD-10-CM

## 2021-08-17 DIAGNOSIS — E1122 Type 2 diabetes mellitus with diabetic chronic kidney disease: Secondary | ICD-10-CM

## 2021-08-17 DIAGNOSIS — K8689 Other specified diseases of pancreas: Secondary | ICD-10-CM

## 2021-08-17 MED ORDER — GADOBENATE DIMEGLUMINE 529 MG/ML IV SOLN
20.0000 mL | Freq: Once | INTRAVENOUS | Status: AC | PRN
  Administered 2021-08-17: 20 mL via INTRAVENOUS

## 2021-08-26 ENCOUNTER — Ambulatory Visit (INDEPENDENT_AMBULATORY_CARE_PROVIDER_SITE_OTHER): Payer: 59 | Admitting: Physician Assistant

## 2021-08-26 VITALS — BP 186/96 | HR 88

## 2021-08-26 DIAGNOSIS — R8281 Pyuria: Secondary | ICD-10-CM

## 2021-08-26 DIAGNOSIS — D4410 Neoplasm of uncertain behavior of unspecified adrenal gland: Secondary | ICD-10-CM

## 2021-08-26 DIAGNOSIS — R8271 Bacteriuria: Secondary | ICD-10-CM | POA: Diagnosis not present

## 2021-08-26 DIAGNOSIS — N289 Disorder of kidney and ureter, unspecified: Secondary | ICD-10-CM

## 2021-08-26 LAB — URINALYSIS, ROUTINE W REFLEX MICROSCOPIC
Bilirubin, UA: NEGATIVE
Glucose, UA: NEGATIVE
Ketones, UA: NEGATIVE
Nitrite, UA: NEGATIVE
Specific Gravity, UA: 1.015 (ref 1.005–1.030)
Urobilinogen, Ur: 1 mg/dL (ref 0.2–1.0)
pH, UA: 7 (ref 5.0–7.5)

## 2021-08-26 LAB — MICROSCOPIC EXAMINATION: Renal Epithel, UA: NONE SEEN /hpf

## 2021-08-26 MED ORDER — CEPHALEXIN 250 MG PO CAPS: 250.0000 mg | ORAL_CAPSULE | Freq: Four times a day (QID) | ORAL | 0 refills | Status: DC

## 2021-08-26 NOTE — Progress Notes (Unsigned)
08/26/2021 10:24 AM   [name] 23-Feb-1940 [id number]   Assessment:  1. Renal lesion - Urinalysis, Routine w reflex microscopic  2. 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 Bacteriuria with pyuria - Urine Culture    Plan: Discussed MRI findings at length with the patient.  Differential diagnoses also discussed and patient agrees with Endocrinology consult for eval of possible pheo. Urine cx and Keflex sent to pharmacy. FU in 2-3 weeks for recheck UA.  Follow-up with nephrology as scheduled.  Chief Complaint: No chief complaint on file.   Referring provider: Liana Gerold, MD [phone],  [place] [address]   History of Present Illness:  [name] is a 81 y.o. year old female with h/o CKD, HTN, DM, CHF, hyperparathyroidism who is seen in consultation from Dr. Ulice Bold, MD for evaluation of complex renal cyst.  Patient was seen in the emergency department in May of this year with hyperglycemia and abdominal pain.  CT was ordered at that time revealing a complex and septated renal cyst.  MRI was recommended by radiology for further evaluation.  Ovarian cystic lesion noted as well.  Following hospital discharge, the patient followed up with nephrology and MRI was scheduled.  Patient was found to have hemorrhagic/proteinaceous cyst and calyceal diverticulum on the right with a retroperitoneal septated cystic lesion either adjacent or off of the adrenal gland with differential including teratoma, pheochromocytoma, bronchogenic cyst, hematoma.  Pheochromocytoma correlation recommended.  Additionally, the patient complains of intermittency and occasional straining to urinate.  She denies history of incontinence or UTIs.  No history of stone disease or gross hematuria.  Imaging reviewed with Dr. Felipa Eth during today's office visit.  UA = 11-30 WBCs, moderate bacteria, nitrite negative Medical records  including office visit notes, laboratory studies, imaging studies reviewed during the patient's office visit.  Past Medical History:  Past Medical History:  Diagnosis Date   CKD (chronic kidney disease)    Diabetes mellitus    Diastolic heart failure (HCC)    GERD (gastroesophageal reflux disease)    Hypertension    Parkinson disease ([place])    Scoliosis    Scoliosis    UTI (lower urinary tract infection)     Past Surgical History:  Past Surgical History:  Procedure Laterality Date   BALLOON DILATION N/A 02/21/2021   Procedure: BALLOON DILATION;  Surgeon: Eloise Harman, DO;  [facility];  Service: Endoscopy;  Laterality: N/A;   BIOPSY  02/21/2021   Procedure: BIOPSY;  Surgeon: Eloise Harman, DO;  [facility];  Service: Endoscopy;;   boil Left    boil removed from left cheek   ESOPHAGOGASTRODUODENOSCOPY (EGD) WITH PROPOFOL N/A 02/21/2021   Procedure: ESOPHAGOGASTRODUODENOSCOPY (EGD) WITH PROPOFOL;  Surgeon: Eloise Harman, DO;  [facility];  Service: Endoscopy;  Laterality: N/A;  11:00am   EYE SURGERY     Retina Detachment   MASS EXCISION N/A 06/27/2014   Procedure: EXCISION SUBMANDIBULAR STONE;  Surgeon: Izora Gala, MD;  Location: [facility] OR;  Service: ENT;  Laterality: N/A;   sublingual gland removal  2016   tumor removed from rib      Allergies:  Allergies  Allergen Reactions   Reglan [Metoclopramide]     Jerking    Family History:  Family History  Problem Relation Age of Onset   Colon cancer Maternal Grandmother        62  Social History:  Social History   Tobacco Use   Smoking status: Never   Smokeless tobacco: Never  Vaping Use   Vaping Use: Never used  Substance Use Topics   Alcohol use: No    Alcohol/week: 0.0 standard drinks of alcohol   Drug use: No    Review of symptoms:  Constitutional:  Negative for unexplained weight loss, night sweats, fever, chills ENT:  Negative for nose bleeds, sinus pain, painful  swallowing CV:  Negative for chest pain, shortness of breath, exercise intolerance, palpitations, loss of consciousness Resp:  Negative for cough, wheezing, shortness of breath GI:  Negative for nausea, vomiting, diarrhea, bloody stools GU:  Positives noted in HPI Neuro:  Negative for seizures, poor balance, limb weakness, slurred speech Psych:  Negative for lack of energy, depression, anxiety Endocrine:  Negative for polydipsia, polyuria, symptoms of hypoglycemia (dizziness, hunger, sweating) Hematologic:  Negative for anemia, purpura, petechia, prolonged or excessive bleeding, use of anticoagulants   Physical Exam: BP (!) 186/96   Pulse 88   Constitutional:  Alert and oriented, No acute distress. HEENT: NCAT, moist mucus membranes.  Trachea midline, no masses. Cardiovascular: Regular rate and rhythm without murmur, rub, or gallops Respiratory: Normal respiratory effort, clear to auscultation bilaterally GI: Abdomen is soft, nontender, nondistended, no abdominal masses BACK:  Non-tender to palpation.  No CVAT Lymph: No cervical or inguinal lymphadenopathy. Skin: No obvious rashes, warm, dry, intact Neurologic: Alert and oriented, Cranial nerves grossly intact, no focal deficits, moving all 4 extremities. Psychiatric: Appropriate. Normal mood and affect.  Laboratory Data: No results found for this or any previous visit (from the past 24 hour(s)).  Lab Results  Component Value Date   WBC 9.7 06/04/2021   HGB 11.7 (L) 06/04/2021   HCT 36.4 06/04/2021   MCV 78.8 (L) 06/04/2021   PLT 292 06/04/2021    Lab Results  Component Value Date   CREATININE 1.35 (H) 06/04/2021    No results found for: "PSA"  No results found for: "TESTOSTERONE"  No results found for: "HGBA1C"  Urinalysis    Component Value Date/Time   COLORURINE STRAW (A) 06/04/2021 1801   APPEARANCEUR CLEAR 06/04/2021 1801   LABSPEC 1.006 06/04/2021 1801   PHURINE 7.0 06/04/2021 1801   GLUCOSEU >=500 (A)  06/04/2021 1801   HGBUR NEGATIVE 06/04/2021 1801   BILIRUBINUR NEGATIVE 06/04/2021 1801   KETONESUR NEGATIVE 06/04/2021 1801   PROTEINUR NEGATIVE 06/04/2021 1801   UROBILINOGEN 1.0 10/12/2014 1935   NITRITE NEGATIVE 06/04/2021 1801   LEUKOCYTESUR NEGATIVE 06/04/2021 1801    Lab Results  Component Value Date   BACTERIA RARE (A) 06/04/2021    Pertinent Imaging: No results found for this or any previous visit.  No results found for this or any previous visit.     [place], Berneice Heinrich, PA-C [facility] [place]

## 2021-08-29 LAB — URINE CULTURE

## 2021-09-01 ENCOUNTER — Telehealth: Payer: Self-pay

## 2021-09-01 NOTE — Telephone Encounter (Signed)
Made patient aware that her urine cultural was positive and Keflex will cover the UTI. Patient voice understanding and state that [name] gave her this medication at her office visit.

## 2021-09-01 NOTE — Telephone Encounter (Signed)
-----   Message from Reynaldo Minium, [place] sent at 09/01/2021  8:59 AM EDT ----- Please let pt know her urine cx is positive and indicates Keflex will cover the bacteria well. ----- Message ----- From: Interface, [facility] Lab Results In Sent: 08/26/2021   1:11 PM EDT To: Reynaldo Minium, PA-C

## 2021-09-01 NOTE — Telephone Encounter (Signed)
Called patient with no answer, left voice message for patient to call the office back. I will try back later to call patient.

## 2021-09-05 ENCOUNTER — Ambulatory Visit (INDEPENDENT_AMBULATORY_CARE_PROVIDER_SITE_OTHER): Payer: 59 | Admitting: Gastroenterology

## 2021-09-05 ENCOUNTER — Encounter: Payer: Self-pay | Admitting: Gastroenterology

## 2021-09-05 VITALS — BP 150/92 | HR 89 | Temp 97.3°F | Ht 65.0 in | Wt 168.8 lb

## 2021-09-05 DIAGNOSIS — R9389 Abnormal findings on diagnostic imaging of other specified body structures: Secondary | ICD-10-CM | POA: Diagnosis not present

## 2021-09-05 DIAGNOSIS — K838 Other specified diseases of biliary tract: Secondary | ICD-10-CM | POA: Diagnosis not present

## 2021-09-05 NOTE — Patient Instructions (Addendum)
Complete labs at [facility]. We will be in touch with results.

## 2021-09-05 NOTE — Progress Notes (Signed)
GI Office Note    Referring Provider: Iona Beard, MD Primary Care Physician:  Iona Beard, MD  Primary Gastroenterologist:Michael Gala Romney, MD   Chief Complaint   Chief Complaint  Patient presents with   Follow-up    History of Present Illness   [name] is a 81 y.o. female presenting today for follow-up. Dr. Theador Hawthorne suggested the patient see [place] for pancreatic atrophy/biliary duct dilatation seen on recent CT.  Daughter mentioned patient was here today for a gallbladder cyst but suspect misunderstanding/miscommunication.  Last seen in January. She has history of dysphagia, esophageal dysmotility, large hiatal hernia, GERD.  Patient seen in the ED in 05/2021 for diarrhea and elevated blood sugar. Also with generalized abdominal pain. CT A/P at that time with enlarged CBD 1.5   cm, enlarged intrahepatic biliary ducts up to 6 mm, diffusely atrophic pancreas. Lobulated 2.4 X 2.7 cm right renal lesion. 6.8 X 5.4 cm left ovarian cystic lesion. 7.8 X 6.1 cm cystic lesion within upper quadrant abutting and compressing the IVC. Lesion abutted caudate lobe and CBD. Nodularity and calcification within lesion. Finding could represent choledochal cyst vs other benign/malignant lesions.   To further evaluate CT findings she had MRI abd with and without contrast completed 08/2021. The Right renal area of interest on CT felt to represent non-malignant process. The complex multiseptated cystic lesion within abdominal retroperitoneum is either adjacent to or exophytic off the right adrenal gland. Intra/extrahepatic biliary duct dilation less impressive as per CT findings. Similar appearance of main pancreatic duct and side branch ectasia since 2012. Possibly chronic pancreatitis. LEFT OVARIAN LESION NOT WITHIN VIE ON MRI ABDOMEN.  She has pending referral to endocrinology for pheochromocytoma work up. She has referral to general surgery (Dr. Carlis Abbott at [facility]), ?oncology, gynecology (Dr. Nelda Marseille).     Patient states her appetite is not great but really has been unchanged over the last 6-8 months. Denies abdominal pain, heartburn, N/V. Stools go from normal to loose, not more than 3 per day. Does not take antidiarrhea medication anymore, imodium was prescribed in 05/2021. She has had some lower extremity edema, says weight went up 20 pounds but down 11 pounds on diuretics. No melena, brbpr. Had to cut back on Eldertonic dose because they determined it was making her vomit.     CT abdomen pelvis with contrast May 2023 for left lower quadrant pain: IMPRESSION: 1. Indeterminate 2.7 cm right renal lesion. This finding appears different from prior hemorrhagic cyst noted in 2012. Underlying malignancy not excluded. Recommend MRI renal protocol for further evaluation. 2. Indeterminate slightly heterogeneous multi-septated 7.8 x 6.1 cm cystic lesion within the upper quadrant abutting and compressing the inferior vena cava. This lesion is noted to abut the caudate lobe as well as the common bile duct. Finding could represent a choledochal cyst versus other benign/malignant lesion (certainly in the setting of a possible malignant renal lesion). Finding can be further evaluated on the MRI renal protocol. 3. A 6.8 x 5.4 cm left ovarian cystic lesion. Recommend follow-up US in 6-12 months. Note: This recommendation does not apply to premenarchal patients and to those with increased risk (genetic, family history, elevated tumor markers or other high-risk factors) of ovarian cancer. Reference: JACR 2020 Feb; 17(2):248-254 4. Enlarged intra and extrahepatic biliary ducts. Correlate with liver function tests. 5. Large hiatal hernia containing the majority of the stomach. 6.  Aortic Atherosclerosis (ICD10-I70.0).   MRI abdomen with and without contrast August 2023: IMPRESSION: 1. The right renal area  of interest on CT represents a combination of a hemorrhagic/proteinaceous cyst and adjacent  calyceal diverticulum. 2. Complex, multi septated cystic lesion within the abdominal retroperitoneum, either adjacent to or exophytic off the right adrenal gland. Differential considerations are broad, included an exophytic adrenal pheochromocytoma, retroperitoneal cystic teratoma, retroperitoneal complicated bronchogenic cyst or even a chronic hematoma. Consider laboratory correlation for pheochromocytoma and subsequent tissue sampling. 3. Increase in intra and extrahepatic biliary duct dilatation, without obstructive cause identified. 4. Relatively similar appearance of main pancreatic duct and side branch duct ectasia since 2012. 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 ERCP may be informative to evaluate both the pancreatic duct and biliary duct dilatation. 5. Large hiatal hernia 6. Tiny bilateral pleural effusions 7.  Aortic Atherosclerosis (ICD10-I70.0).    EGD February 2023: - 7 cm hiatal hernia. - Mild Schatzki ring. Dilated. - Gastritis. Biopsied. - Normal duodenal bulb, first portion of the duodenum and second portion of the duodenum. -Biopsies with reactive gastropathy, no H. pylori.  Medications   Current Outpatient Medications  Medication Sig Dispense Refill   acetaminophen-codeine (TYLENOL #3) 300-30 MG tablet Take 1 tablet by mouth 2 (two) times daily.     aspirin EC 81 MG tablet Take 81 mg by mouth daily.     carvedilol (COREG) 6.25 MG tablet Take 6.25 mg by mouth 2 (two) times daily.     erythromycin (E-MYCIN) 250 MG tablet Take 250 mg by mouth 3 (three) times daily.     furosemide (LASIX) 20 MG tablet Take 20 mg by mouth 2 (two) times daily.     HUMALOG MIX 50/50 KWIKPEN (50-50) 100 UNIT/ML Kwikpen Take 20-40 Units by mouth See admin instructions. 40 units in AM and 20 units in PM ( if CBG is < 140 in the AM , no units given)     ketoconazole (NIZORAL) 2 % cream Apply 1 application topically daily. 100 g 0   lisinopril-hydrochlorothiazide (ZESTORETIC)  20-12.5 MG tablet Take 0.25 tablets by mouth 3 (three) times a week. Mon, Wed, Fri     loperamide (IMODIUM) 2 MG capsule Take 1 capsule (2 mg total) by mouth 4 (four) times daily as needed for diarrhea or loose stools. 12 capsule 0   metoprolol tartrate (LOPRESSOR) 25 MG tablet Take 12.5 mg by mouth 2 (two) times daily.     olopatadine (PATANOL) 0.1 % ophthalmic solution Place 1 drop into both eyes daily as needed for allergies.      omeprazole (PRILOSEC) 20 MG capsule TAKE 1 CAPSULE BY MOUTH TWICE DAILY BEFORE A MEAL. 60 capsule 5   potassium chloride SA (K-DUR,KLOR-CON) 20 MEQ tablet Take 20 mEq by mouth daily.     risedronate (ACTONEL) 150 MG tablet Take 150 mg by mouth every 30 (thirty) days. with water on empty stomach, nothing by mouth or lie down for next 30 minutes.     simvastatin (ZOCOR) 5 MG tablet Take 5 mg by mouth at bedtime.     telmisartan (MICARDIS) 20 MG tablet Take 20 mg by mouth daily.     No current facility-administered medications for this visit.    Allergies   Allergies as of 09/05/2021 - Review Complete 09/05/2021  Allergen Reaction Noted   Reglan [metoclopramide]  08/22/2020     Past Medical History   Past Medical History:  Diagnosis Date   CKD (chronic kidney disease)    Diabetes mellitus    Diastolic heart failure (HCC)    GERD (gastroesophageal reflux disease)    Hypertension  Parkinson disease ([place])    Scoliosis    Scoliosis    UTI (lower urinary tract infection)     Past Surgical History   Past Surgical History:  Procedure Laterality Date   BALLOON DILATION N/A 02/21/2021   Procedure: BALLOON DILATION;  Surgeon: Eloise Harman, DO;  [facility];  Service: Endoscopy;  Laterality: N/A;   BIOPSY  02/21/2021   Procedure: BIOPSY;  Surgeon: Eloise Harman, DO;  [facility];  Service: Endoscopy;;   boil Left    boil removed from left cheek   ESOPHAGOGASTRODUODENOSCOPY (EGD) WITH PROPOFOL N/A 02/21/2021   Procedure:  ESOPHAGOGASTRODUODENOSCOPY (EGD) WITH PROPOFOL;  Surgeon: Eloise Harman, DO;  [facility];  Service: Endoscopy;  Laterality: N/A;  11:00am   EYE SURGERY     Retina Detachment   MASS EXCISION N/A 06/27/2014   Procedure: EXCISION SUBMANDIBULAR STONE;  Surgeon: Izora Gala, MD;  Location: [facility];  Service: ENT;  Laterality: N/A;   sublingual gland removal  2016   tumor removed from rib      Past Family History   Family History  Problem Relation Age of Onset   Colon cancer Maternal Grandmother        50   Kidney cancer Daughter 48   Ovarian cancer Daughter 29    Past Social History   Social History   Socioeconomic History   Marital status: Divorced    Spouse name: Not on file   Number of children: Not on file   Years of education: Not on file   Highest education level: Not on file  Occupational History   Not on file  Tobacco Use   Smoking status: Never   Smokeless tobacco: Never  Vaping Use   Vaping Use: Never used  Substance and Sexual Activity   Alcohol use: No    Alcohol/week: 0.0 standard drinks of alcohol   Drug use: No   Sexual activity: Not Currently    Birth control/protection: Post-menopausal, None  Other Topics Concern   Not on file  Social History Narrative   Not on file   Social Determinants of Health   Financial Resource Strain: Not on file  Food Insecurity: Not on file  Transportation Needs: Not on file  Physical Activity: Not on file  Stress: Not on file  Social Connections: Not on file  Intimate Partner Violence: Not on file    Review of Systems   General: Negative for anorexia, weight loss, fever, chills, fatigue, +weakness. ENT: Negative for hoarseness, difficulty swallowing , nasal congestion. CV: Negative for chest pain, angina, palpitations, dyspnea on exertion, peripheral edema.  Respiratory: Negative for dyspnea at rest, dyspnea on exertion, cough, sputum, wheezing.  GI: See history of present illness. GU:  Negative for  dysuria, hematuria, urinary incontinence, urinary frequency, nocturnal urination.  Endo: Negative for unusual weight change.     Physical Exam   BP (!) 150/92 (BP Location: Right Arm, Patient Position: Sitting, Cuff Size: Normal)   Pulse 89   Temp (!) 97.3 F (36.3 C) (Temporal)   Ht '5\' 5"'$  (1.651 m)   Wt 168 lb 12.8 oz (76.6 kg)   SpO2 98%   BMI 28.09 kg/m    General: elderly female ambulates with walker, accompanied by daughter, in no acute distress.  Eyes: No icterus. Mouth: Oropharyngeal mucosa moist and pink , no lesions erythema or exudate. Lungs: Clear to auscultation bilaterally.  Heart: Regular rate and rhythm, no murmurs rubs or gallops.  Abdomen: Bowel sounds are normal, nontender, nondistended, no hepatosplenomegaly or masses,  no abdominal bruits or hernia , no rebound or guarding.  Rectal: not performed Extremities: 2_pitting lower extremity edema. No clubbing or deformities. Neuro: Alert and oriented x 4   Skin: Warm and dry, no jaundice.   Psych: Alert and cooperative, normal mood and affect.  Labs   August 2023: White blood cell count 5800, hemoglobin 12.3, MCV 79.8, platelets 333,000.  Glucose 177, creatinine 1.46,  Imaging Studies   MR ABDOMEN WWO CONTRAST  Result Date: 08/18/2021 CLINICAL DATA:  Recent abdominal CT performed for left lower quadrant pain demonstrating an indeterminate right renal lesion and an abdominal retroperitoneal lesion. EXAM: MRI ABDOMEN WITHOUT AND WITH CONTRAST TECHNIQUE: Multiplanar multisequence MR imaging of the abdomen was performed both before and after the administration of intravenous contrast. CONTRAST:  88m MULTIHANCE GADOBENATE DIMEGLUMINE 529 MG/ML IV SOLN COMPARISON:  CT of 06/04/2021 and MRI of 08/26/2010. FINDINGS: Lower chest: Large hiatal hernia. Normal heart size without pericardial or pleural effusion. Tiny bilateral pleural effusions. Hepatobiliary: No suspicious liver lesion. High right hepatic lobe cyst. Normal  gallbladder. Mild intrahepatic biliary duct dilatation. Mild common duct dilatation at 11 mm on 22/4, increased from 9 mm in 2012. Periampullary duodenal diverticulum. No choledocholithiasis or obstructive mass. Pancreas: Pancreatic main and side-branch duct ectasia, including on 22/10. Present on the 2012 MRI. Surrounding pancreatic atrophy may be slightly increased. Spleen:  Normal in size, without focal abnormality. Adrenals/Urinary Tract: Bilateral adrenal thickening. Positioned within the right upper abdominal retroperitoneum, either adjacent to or exophytic off the right adrenal gland, is a multi septated cystic lesion of 7.0 x 5.6 by 8.7 cm including on 16/7 and 16/4. Areas of precontrast T1 hyperintensity on series 12 with enhancement within the smooth septae and an area of nodularity including at 1.3 cm on 54/21. Bilateral renal cortical thinning. Small simple and complex renal cysts, including an interpolar 7 mm left renal hemorrhagic/proteinaceous cyst. More focal atrophy involving the upper and lower pole right kidney. Corresponding to the CT abnormality, within the upper pole right kidney, is a nonenhancing 2.7 x 1.8 cm lesion on 62/21 subtracted. This represents a combination of a hemorrhagic/proteinaceous cyst (given precontrast T1 hyperintensity on 62/12) and an adjacent calyceal diverticulum (given contrast pooling on 68/21 delayed). No suspicious renal mass or hydronephrosis. Stomach/Bowel: Normal distal most stomach. Normal caliber of abdominal bowel loops. Periampullary duodenal diverticulum. Vascular/Lymphatic: Aortic atherosclerosis. No retroperitoneal or retrocrural adenopathy. Other:  No ascites.  No evidence of omental or peritoneal disease. Musculoskeletal: No acute osseous abnormality. IMPRESSION: 1. The right renal area of interest on CT represents a combination of a hemorrhagic/proteinaceous cyst and adjacent calyceal diverticulum. 2. Complex, multi septated cystic lesion within the  abdominal retroperitoneum, either adjacent to or exophytic off the right adrenal gland. Differential considerations are broad, included an exophytic adrenal pheochromocytoma, retroperitoneal cystic teratoma, retroperitoneal complicated bronchogenic cyst or even a chronic hematoma. Consider laboratory correlation for pheochromocytoma and subsequent tissue sampling. 3. Increase in intra and extrahepatic biliary duct dilatation, without obstructive cause identified. 4. Relatively similar appearance of main pancreatic duct and side branch duct ectasia since 2012. 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 ERCP may be informative to evaluate both the pancreatic duct and biliary duct dilatation. 5. Large hiatal hernia 6. Tiny bilateral pleural effusions 7.  Aortic Atherosclerosis (ICD10-I70.0). Electronically Signed   By: KAbigail MiyamotoM.D.   On: 08/18/2021 10:00    Assessment    Abnormal CT/MRI: multi septated cystic lesion in  abdominal retroperitoneum either adjacent to or exophytic off right adrenal gland. By CT the lesion abuts the CBD and there is intra/extrahepatic biliary duct dilation. Atrophic pancreas and by MRI stable main pancreatic duct and side branch duct ectasia since 2012, possible chronic pancreatitis. By CT pelvis she also has left ovarian cystic lesion.   She has multiple pending referrals to surgery, gyn, and oncology which is appropriate as she will need a multidisciplinary approach given abnormal findings.   From a GI standpoint, cystic lesion may be contributing to her biliary dilation. Will check LFTs. Appears pancreas findings are stable from 2012. She may have chronic pancreatitis which could be contributing to her loose stools. She denies any abdominal pain. If patient requires any further imaging such as ERCP or EUS, this would need to be done at [facility] as it is not done locally and this would assist with coordination of her care.   PLAN   Complete labs today.  We will verify  daughter is aware of appointment with [facility] general surgery, Dr. Bailey Mech on 09/15/21 and also with gyn same dates. She will need to reschedule her gyn appointment. Complete referrals to endocrinology and oncology.    Laureen Ochs. Bobby Rumpf, [place], [facility]

## 2021-09-06 LAB — HEPATIC FUNCTION PANEL
AG Ratio: 0.9 (calc) — ABNORMAL LOW (ref 1.0–2.5)
ALT: 6 U/L (ref 6–29)
AST: 7 U/L — ABNORMAL LOW (ref 10–35)
Albumin: 3.1 g/dL — ABNORMAL LOW (ref 3.6–5.1)
Alkaline phosphatase (APISO): 110 U/L (ref 37–153)
Bilirubin, Direct: 0.1 mg/dL (ref 0.0–0.2)
Globulin: 3.6 g/dL (calc) (ref 1.9–3.7)
Indirect Bilirubin: 0.2 mg/dL (calc) (ref 0.2–1.2)
Total Bilirubin: 0.3 mg/dL (ref 0.2–1.2)
Total Protein: 6.7 g/dL (ref 6.1–8.1)

## 2021-09-06 LAB — LIPASE: Lipase: 9 U/L (ref 7–60)

## 2021-09-11 ENCOUNTER — Ambulatory Visit: Payer: 59 | Admitting: Physician Assistant

## 2021-09-15 ENCOUNTER — Ambulatory Visit: Payer: 59 | Admitting: Obstetrics & Gynecology

## 2021-09-29 ENCOUNTER — Ambulatory Visit: Payer: 59 | Admitting: Gastroenterology

## 2021-10-14 ENCOUNTER — Ambulatory Visit: Payer: 59 | Admitting: Obstetrics & Gynecology

## 2021-10-23 ENCOUNTER — Telehealth: Payer: Self-pay | Admitting: Gastroenterology

## 2021-10-23 NOTE — Telephone Encounter (Signed)
Reviewed records received from Dr. Carlis Abbott (general surgery) at [facility] regarding retroperitoneal mass. Plans to watch closely but no surgical intervention.   Patient also has larger left ovarian cystic lesion, seen on CT A/P (not seen on MRI abd because ovarians not scanned). She has not seen gyn, recently missed appt.   Please encouraged patient to follow up with gyn for ovarian cyst.

## 2021-10-27 NOTE — Telephone Encounter (Signed)
Daughter states that the pt has an upcoming appt with gyn regarding the lesion.

## 2021-10-27 NOTE — Telephone Encounter (Signed)
noted 

## 2021-12-01 ENCOUNTER — Ambulatory Visit: Payer: 59 | Admitting: "Endocrinology

## 2021-12-26 ENCOUNTER — Other Ambulatory Visit: Payer: Self-pay | Admitting: Gastroenterology

## 2021-12-26 DIAGNOSIS — K219 Gastro-esophageal reflux disease without esophagitis: Secondary | ICD-10-CM

## 2021-12-26 DIAGNOSIS — R112 Nausea with vomiting, unspecified: Secondary | ICD-10-CM

## 2022-01-06 ENCOUNTER — Encounter: Payer: Self-pay | Admitting: "Endocrinology

## 2022-01-06 ENCOUNTER — Ambulatory Visit (INDEPENDENT_AMBULATORY_CARE_PROVIDER_SITE_OTHER): Payer: 59 | Admitting: "Endocrinology

## 2022-01-06 VITALS — BP 118/74 | HR 88 | Ht 65.0 in | Wt 173.0 lb

## 2022-01-06 DIAGNOSIS — R1900 Intra-abdominal and pelvic swelling, mass and lump, unspecified site: Secondary | ICD-10-CM

## 2022-01-06 NOTE — Progress Notes (Signed)
Endocrinology Consult Note                                            01/06/2022, 4:42 PM   Subjective:    Patient ID: [name], female    DOB: 1940-10-28, PCP Iona Beard, MD   Past Medical History:  Diagnosis Date   CKD (chronic kidney disease)    Diabetes mellitus    Diastolic heart failure ([place])    GERD (gastroesophageal reflux disease)    Hypertension    Parkinson disease    Scoliosis    Scoliosis    UTI (lower urinary tract infection)    Past Surgical History:  Procedure Laterality Date   BALLOON DILATION N/A 02/21/2021   Procedure: BALLOON DILATION;  Surgeon: Eloise Harman, DO;  [facility];  Service: Endoscopy;  Laterality: N/A;   BIOPSY  02/21/2021   Procedure: BIOPSY;  Surgeon: Eloise Harman, DO;  [facility];  Service: Endoscopy;;   boil Left    boil removed from left cheek   ESOPHAGOGASTRODUODENOSCOPY (EGD) WITH PROPOFOL N/A 02/21/2021   Procedure: ESOPHAGOGASTRODUODENOSCOPY (EGD) WITH PROPOFOL;  Surgeon: Eloise Harman, DO;  [facility];  Service: Endoscopy;  Laterality: N/A;  11:00am   EYE SURGERY     Retina Detachment   MASS EXCISION N/A 06/27/2014   Procedure: EXCISION SUBMANDIBULAR STONE;  Surgeon: Izora Gala, MD;  Location: [facility] OR;  Service: ENT;  Laterality: N/A;   sublingual gland removal  2016   tumor removed from rib     Social History   Socioeconomic History   Marital status: Divorced    Spouse name: Not on file   Number of children: Not on file   Years of education: Not on file   Highest education level: Not on file  Occupational History   Not on file  Tobacco Use   Smoking status: Never   Smokeless tobacco: Never  Vaping Use   Vaping Use: Never used  Substance and Sexual Activity   Alcohol use: No    Alcohol/week: 0.0 standard drinks of alcohol   Drug use: No   Sexual activity: Not Currently    Birth control/protection: Post-menopausal, None  Other Topics Concern   Not on file   Social History Narrative   Not on file   Social Determinants of Health   Financial Resource Strain: Not on file  Food Insecurity: Not on file  Transportation Needs: Not on file  Physical Activity: Not on file  Stress: Not on file  Social Connections: Not on file   Family History  Problem Relation Age of Onset   Colon cancer Maternal Grandmother        27   Kidney cancer Daughter 50   Ovarian cancer Daughter 79   Outpatient Encounter Medications as of 01/06/2022  Medication Sig   acetaminophen-codeine (TYLENOL #3) 300-30 MG tablet Take 1 tablet by mouth 2 (two) times daily.   aspirin EC 81 MG tablet Take 81 mg by mouth daily.   carvedilol (COREG) 6.25 MG tablet Take 6.25 mg by mouth 2 (two) times daily.   erythromycin (E-MYCIN) 250 MG tablet Take 250 mg by mouth 3 (three) times daily.   furosemide (LASIX) 20 MG tablet Take 20 mg by mouth 2 (two) times daily. Two tablets qam and 1 tablet qpm  HUMALOG MIX 50/50 KWIKPEN (50-50) 100 UNIT/ML Kwikpen Take 20-40 Units by mouth See admin instructions. 40 units in AM and 20 units in PM ( if CBG is < 140 in the AM , no units given)   ketoconazole (NIZORAL) 2 % cream Apply 1 application topically daily.   lisinopril-hydrochlorothiazide (ZESTORETIC) 20-12.5 MG tablet Take 0.25 tablets by mouth 3 (three) times a week. Mon, Wed, Fri   loperamide (IMODIUM) 2 MG capsule Take 1 capsule (2 mg total) by mouth 4 (four) times daily as needed for diarrhea or loose stools.   metoprolol tartrate (LOPRESSOR) 25 MG tablet Take 12.5 mg by mouth 2 (two) times daily. (Patient not taking: Reported on 01/06/2022)   olopatadine (PATANOL) 0.1 % ophthalmic solution Place 1 drop into both eyes daily as needed for allergies.    omeprazole (PRILOSEC) 20 MG capsule TAKE ONE (1) CAPSULE BY MOUTH TWICE DAILY BEFORE MEALS   potassium chloride SA (K-DUR,KLOR-CON) 20 MEQ tablet Take 20 mEq by mouth daily.   risedronate (ACTONEL) 150 MG tablet Take 150 mg by mouth every 30  (thirty) days. with water on empty stomach, nothing by mouth or lie down for next 30 minutes.   simvastatin (ZOCOR) 5 MG tablet Take 5 mg by mouth at bedtime.   telmisartan (MICARDIS) 20 MG tablet Take 20 mg by mouth daily.   No facility-administered encounter medications on file as of 01/06/2022.   ALLERGIES: Allergies  Allergen Reactions   Reglan [Metoclopramide]     Jerking    VACCINATION STATUS: Immunization History  Administered Date(s) Administered   Influenza-Unspecified 09/05/2013    HPI [name] is 82 y.o. female who presents today with a medical history as above. she is being seen in consultation for right adrenal lesion requested by Iona Beard, MD.  Patient is accompanied by her grown daughter.  History is obtained from chart review as well as interview with the family.  She denies any prior history of malignancy.  In May 2023 she was found to have large multiseptated cystic lesion within the upper quadrant of abdomen abutting and compressing the inferior vena cava.  This lesion was noted to abut the 40th lobe of the liver as well as the common bile duct.  On this study, she was also found to have renal cysts. Her more recent MRI of the abdomen on August 2023 the mass lesion was described as complex, multiseptated cystic lesion within the abdominal retroperitoneum, either adjacent to or exophytic of the right adrenal gland. She was seen by [facility] surgeons and according to the notes expectant management was decided.  I do not see a endocrine workup of this lesion. Patient has medical history history including hypertension on 4 medications, hyperlipidemia on Zocor as well as type 2 diabetes managed by premixed insulin twice a day. She has diffuse large joint arthritis, walking with a walker. Her major complaint is pain from his arthritis.     Review of Systems  Constitutional: + Reports fluctuating body weight, + fatigue  fatigue, no subjective hyperthermia, no subjective  hypothermia Eyes: no blurry vision, no xerophthalmia ENT: no sore throat, no nodules palpated in throat, no dysphagia/odynophagia, no hoarseness Cardiovascular: no Chest Pain, no Shortness of Breath, no palpitations, no leg swelling Respiratory: no cough, no shortness of breath Gastrointestinal: no Nausea/Vomiting/Diarhhea Musculoskeletal: + muscle/joint aches Skin: no rashes Neurological: no tremors, no numbness, no tingling, no dizziness Psychiatric: no depression, no anxiety  Objective:       01/06/2022    1:40  PM 09/05/2021    8:50 AM 09/05/2021    8:20 AM  Vitals with BMI  Height '5\' 5"'$   '5\' 5"'$   Weight 173 lbs  168 lbs 13 oz  BMI 56.81  27.51  Systolic 700 174 944  Diastolic 74 92 967  Pulse 88  89    BP 118/74   Pulse 88   Ht '5\' 5"'$  (1.651 m)   Wt 173 lb (78.5 kg)   BMI 28.79 kg/m   Wt Readings from Last 3 Encounters:  01/06/22 173 lb (78.5 kg)  09/05/21 168 lb 12.8 oz (76.6 kg)  06/04/21 169 lb 12.1 oz (77 kg)    Physical Exam  Constitutional:  Body mass index is 28.79 kg/m.,  not in acute distress, normal state of mind Eyes: PERRLA, EOMI, no exophthalmos, + ambulates with a walker. ENT: moist mucous membranes, no gross thyromegaly, no gross cervical lymphadenopathy Cardiovascular: normal precordial activity, Regular Rate and Rhythm, no Murmur/Rubs/Gallops Respiratory:  adequate breathing efforts, no gross chest deformity, Clear to auscultation bilaterally Gastrointestinal: abdomen soft, Non -tender, No distension, Bowel Sounds present, no gross organomegaly Musculoskeletal: no gross deformities, strength intact in all four extremities Skin: moist, warm, no rashes Neurological: no tremor with outstretched hands, Deep tendon reflexes normal in bilateral lower extremities.  CMP ( most recent) CMP     Component Value Date/Time   NA 135 06/04/2021 1552   K 3.4 (L) 06/04/2021 1552   CL 100 06/04/2021 1552   CO2 27 06/04/2021 1552   GLUCOSE 451 (H) 06/04/2021 1552    BUN 24 (H) 06/04/2021 1552   CREATININE 1.35 (H) 06/04/2021 1552   CALCIUM 8.7 (L) 06/04/2021 1552   PROT 6.7 09/05/2021 0916   ALBUMIN 2.4 (L) 06/04/2021 1552   AST 7 (L) 09/05/2021 0916   ALT 6 09/05/2021 0916   ALKPHOS 93 06/04/2021 1552   BILITOT 0.3 09/05/2021 0916   GFRNONAA 40 (L) 06/04/2021 1552   GFRAA 48 (L) 10/13/2014 0548     Summary of abdominal MRI on August 17, 2021:  IMPRESSION: 1. The right renal area of interest on CT represents a combination of a hemorrhagic/proteinaceous cyst and adjacent calyceal diverticulum. 2. Complex, multi septated cystic lesion within the abdominal retroperitoneum, either adjacent to or exophytic off the right adrenal gland. Differential considerations are broad, included an exophytic adrenal pheochromocytoma, retroperitoneal cystic teratoma, retroperitoneal complicated bronchogenic cyst or even a chronic hematoma. Consider laboratory correlation for pheochromocytoma and subsequent tissue sampling. 3. Increase in intra and extrahepatic biliary duct dilatation, without obstructive cause identified. 4. Relatively similar appearance of main pancreatic duct and side branch duct ectasia since 2012. 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 ERCP may be informative to evaluate both the pancreatic duct and biliary duct dilatation. 5. Large hiatal hernia 6. Tiny bilateral pleural effusions 7.  Aortic Atherosclerosis (ICD10-I70.0).    Assessment & Plan:   1. Retroperitoneal Mass Lesion  - NOURA PURPURA  is being seen at a kind request of Iona Beard, MD. - I have reviewed her available  records and clinically evaluated the patient. - Based on these reviews, she has a large retroperitoneal mass lesion adjacent or abutting the right adrenal gland,  however,  there is not sufficient information to proceed with definitive treatment plan.   Her medical records show that she was evaluated by surgery from [facility] and  expectant management was decided.  She has appointment to see them back in 2 weeks.  She is advised to keep that  appointment. Since I do not see a complete documentation of endocrine workup, I have approached her for 24-hour urine collection for metanephrines, catecholamines and cortisol measurement.  Patient reportedly wears pull-ups, and if urine collection is not satisfactory, she will be considered for free plasma metanephrines to rule out pheochromocytoma/Cushing syndrome.    - I did not initiate any new prescriptions today. - she is advised to maintain close follow up with Iona Beard, MD for primary care needs.   - Time spent with the patient: 45 minutes, of which >50% was spent in  counseling her about her retroperitoneal mass lesion adjacent to the right adrenal gland,  the rest in obtaining information about her symptoms, reviewing her previous labs/studies ( including abstractions from other facilities),  evaluations, and treatments,  and developing a plan to confirm diagnosis and long term treatment based on the latest standards of care/guidelines; and documenting her care.  [name] participated in the discussions, expressed understanding, and voiced agreement with the above plans.  All questions were answered to her satisfaction. she is encouraged to contact clinic should she have any questions or concerns prior to her return visit.  Follow up plan: Return in about 2 weeks (around 01/20/2022), or metanephrines and catecholamines, for 24 Hr Urine Free Cortisol & [place], MD [facility] [address] [place], [place] [address] Phone: [phone]  Fax: [phone]     01/06/2022, 4:42 PM  This note was partially dictated with voice recognition software. Similar sounding words can be transcribed inadequately or may not  be corrected upon review.

## 2022-01-19 LAB — METANEPHRINES, URINE, 24 HOUR
Metaneph Total, Ur: 163 ug/L
Metanephrines, 24H Ur: 106 ug/(24.h) (ref 36–209)
Normetanephrine, 24H Ur: 167 ug/(24.h) (ref 131–612)
Normetanephrine, Ur: 257 ug/L

## 2022-01-19 LAB — CATECHOLAMINES, FRACTIONATED, URINE, 24 HOUR
Dopamine , 24H Ur: 82 ug/(24.h) (ref 0–510)
Dopamine, Rand Ur: 126 ug/L
Epinephrine, 24H Ur: <2 ug/(24.h) (ref 0–20)
Epinephrine, Rand Ur: <3 ug/L
Norepinephrine, 24H Ur: 16 ug/(24.h) (ref 0–135)
Norepinephrine, Rand Ur: 24 ug/L

## 2022-01-19 LAB — CORTISOL, URINE, FREE
Cortisol (Ur), Free: 5 ug/24 hr (ref 3–49)
Cortisol,F,ug/L,U: 8 ug/L

## 2022-01-19 LAB — CREATININE, URINE, 24 HOUR
Creatinine, 24H Ur: 437 mg/24 hr — ABNORMAL LOW (ref 800–1800)
Creatinine, Urine: 67.3 mg/dL

## 2022-01-20 ENCOUNTER — Ambulatory Visit: Payer: 59 | Admitting: "Endocrinology

## 2022-01-28 ENCOUNTER — Ambulatory Visit (INDEPENDENT_AMBULATORY_CARE_PROVIDER_SITE_OTHER): Payer: 59 | Admitting: "Endocrinology

## 2022-01-28 ENCOUNTER — Encounter: Payer: Self-pay | Admitting: "Endocrinology

## 2022-01-28 VITALS — BP 100/58 | HR 72 | Ht 65.0 in

## 2022-01-28 DIAGNOSIS — E1122 Type 2 diabetes mellitus with diabetic chronic kidney disease: Secondary | ICD-10-CM | POA: Diagnosis not present

## 2022-01-28 DIAGNOSIS — R1900 Intra-abdominal and pelvic swelling, mass and lump, unspecified site: Secondary | ICD-10-CM

## 2022-01-28 DIAGNOSIS — N183 Chronic kidney disease, stage 3 unspecified: Secondary | ICD-10-CM

## 2022-01-28 NOTE — Progress Notes (Unsigned)
Endocrinology Consult Note                                            01/28/2022, 7:46 PM   Subjective:    Patient ID: [name], female    DOB: [birth year], PCP Iona Beard, MD   Past Medical History:  Diagnosis Date   CKD (chronic kidney disease)    Diabetes mellitus    Diastolic heart failure ([place])    GERD (gastroesophageal reflux disease)    Hypertension    Parkinson disease    Scoliosis    Scoliosis    UTI (lower urinary tract infection)    Past Surgical History:  Procedure Laterality Date   BALLOON DILATION N/A 02/21/2021   Procedure: BALLOON DILATION;  Surgeon: Eloise Harman, DO;  [facility];  Service: Endoscopy;  Laterality: N/A;   BIOPSY  02/21/2021   Procedure: BIOPSY;  Surgeon: Eloise Harman, DO;  [facility];  Service: Endoscopy;;   boil Left    boil removed from left cheek   ESOPHAGOGASTRODUODENOSCOPY (EGD) WITH PROPOFOL N/A 02/21/2021   Procedure: ESOPHAGOGASTRODUODENOSCOPY (EGD) WITH PROPOFOL;  Surgeon: Eloise Harman, DO;  [facility];  Service: Endoscopy;  Laterality: N/A;  11:00am   EYE SURGERY     Retina Detachment   MASS EXCISION N/A 06/27/2014   Procedure: EXCISION SUBMANDIBULAR STONE;  Surgeon: Izora Gala, MD;  Location: [facility] OR;  Service: ENT;  Laterality: N/A;   sublingual gland removal  2016   tumor removed from rib     Social History   Socioeconomic History   Marital status: Divorced    Spouse name: Not on file   Number of children: Not on file   Years of education: Not on file   Highest education level: Not on file  Occupational History   Not on file  Tobacco Use   Smoking status: Never   Smokeless tobacco: Never  Vaping Use   Vaping Use: Never used  Substance and Sexual Activity   Alcohol use: No    Alcohol/week: 0.0 standard drinks of alcohol   Drug use: No   Sexual activity: Not Currently    Birth control/protection: Post-menopausal, None  Other Topics Concern   Not on  file  Social History Narrative   Not on file   Social Determinants of Health   Financial Resource Strain: Not on file  Food Insecurity: Not on file  Transportation Needs: Not on file  Physical Activity: Not on file  Stress: Not on file  Social Connections: Not on file   Family History  Problem Relation Age of Onset   Colon cancer Maternal Grandmother        23   Kidney cancer Daughter 49   Ovarian cancer Daughter 45   Outpatient Encounter Medications as of 01/28/2022  Medication Sig   acetaminophen-codeine (TYLENOL #3) 300-30 MG tablet Take 1 tablet by mouth 2 (two) times daily.   aspirin EC 81 MG tablet Take 81 mg by mouth daily.   carvedilol (COREG) 6.25 MG tablet Take 6.25 mg by mouth 2 (two) times daily.   erythromycin (E-MYCIN) 250 MG tablet Take 250 mg by mouth 3 (three) times daily.   furosemide (LASIX) 20 MG tablet Take 20 mg by mouth 2 (two) times daily. Two tablets qam and 1 tablet qpm  HUMALOG MIX 50/50 KWIKPEN (50-50) 100 UNIT/ML Kwikpen Take 20 Units by mouth 2 (two) times daily before a meal.   ketoconazole (NIZORAL) 2 % cream Apply 1 application topically daily.   lisinopril-hydrochlorothiazide (ZESTORETIC) 20-12.5 MG tablet Take 0.25 tablets by mouth 3 (three) times a week. Mon, Wed, Fri   loperamide (IMODIUM) 2 MG capsule Take 1 capsule (2 mg total) by mouth 4 (four) times daily as needed for diarrhea or loose stools.   metoprolol tartrate (LOPRESSOR) 25 MG tablet Take 12.5 mg by mouth 2 (two) times daily. (Patient not taking: Reported on 01/06/2022)   olopatadine (PATANOL) 0.1 % ophthalmic solution Place 1 drop into both eyes daily as needed for allergies.    omeprazole (PRILOSEC) 20 MG capsule TAKE ONE (1) CAPSULE BY MOUTH TWICE DAILY BEFORE MEALS   potassium chloride SA (K-DUR,KLOR-CON) 20 MEQ tablet Take 20 mEq by mouth daily.   risedronate (ACTONEL) 150 MG tablet Take 150 mg by mouth every 30 (thirty) days. with water on empty stomach, nothing by mouth or lie  down for next 30 minutes.   simvastatin (ZOCOR) 5 MG tablet Take 5 mg by mouth at bedtime.   telmisartan (MICARDIS) 20 MG tablet Take 20 mg by mouth daily.   No facility-administered encounter medications on file as of 01/28/2022.   ALLERGIES: Allergies  Allergen Reactions   Reglan [Metoclopramide]     Jerking    VACCINATION STATUS: Immunization History  Administered Date(s) Administered   Influenza-Unspecified 09/05/2013    HPI [name] is 82 y.o. female who presents today with a medical history as above. she is being seen in consultation for right adrenal lesion requested by Iona Beard, MD.  Patient is accompanied by her grown daughter.  History is obtained from chart review as well as interview with the family.  She denies any prior history of malignancy.  In May 2023 she was found to have large multiseptated cystic lesion within the upper quadrant of abdomen abutting and compressing the inferior vena cava.  This lesion was noted to abut the 40th lobe of the liver as well as the common bile duct.  On this study, she was also found to have renal cysts. Her more recent MRI of the abdomen on August 2023 the mass lesion was described as complex, multiseptated cystic lesion within the abdominal retroperitoneum, either adjacent to or exophytic of the right adrenal gland. She was seen by [facility] surgeons and according to the notes expectant management was decided.  I do not see a endocrine workup of this lesion. Patient has medical history history including hypertension on 4 medications, hyperlipidemia on Zocor as well as type 2 diabetes managed by premixed insulin twice a day. She has diffuse large joint arthritis, walking with a walker. Her major complaint is pain from his arthritis.     Review of Systems  Constitutional: + Reports fluctuating body weight, + fatigue  fatigue, no subjective hyperthermia, no subjective hypothermia Eyes: no blurry vision, no xerophthalmia ENT: no  sore throat, no nodules palpated in throat, no dysphagia/odynophagia, no hoarseness Cardiovascular: no Chest Pain, no Shortness of Breath, no palpitations, no leg swelling Respiratory: no cough, no shortness of breath Gastrointestinal: no Nausea/Vomiting/Diarhhea Musculoskeletal: + muscle/joint aches Skin: no rashes Neurological: no tremors, no numbness, no tingling, no dizziness Psychiatric: no depression, no anxiety  Objective:       01/28/2022   11:17 AM 01/06/2022    1:40 PM 09/05/2021    8:50 AM  Vitals with BMI  Height  $'5\' 5"'w$  '5\' 5"'$    Weight  173 lbs   BMI  02.58   Systolic 527 782 423  Diastolic 58 74 92  Pulse 72 88     BP (!) 100/58   Pulse 72   Ht '5\' 5"'$  (1.651 m)   BMI 28.79 kg/m   Wt Readings from Last 3 Encounters:  01/06/22 173 lb (78.5 kg)  09/05/21 168 lb 12.8 oz (76.6 kg)  06/04/21 169 lb 12.1 oz (77 kg)    Physical Exam  Constitutional:  Body mass index is 28.79 kg/m.,  not in acute distress, normal state of mind Eyes: PERRLA, EOMI, no exophthalmos, + ambulates with a walker. ENT: moist mucous membranes, no gross thyromegaly, no gross cervical lymphadenopathy Cardiovascular: normal precordial activity, Regular Rate and Rhythm, no Murmur/Rubs/Gallops Respiratory:  adequate breathing efforts, no gross chest deformity, Clear to auscultation bilaterally Gastrointestinal: abdomen soft, Non -tender, No distension, Bowel Sounds present, no gross organomegaly Musculoskeletal: no gross deformities, strength intact in all four extremities Skin: moist, warm, no rashes Neurological: no tremor with outstretched hands, Deep tendon reflexes normal in bilateral lower extremities.  CMP ( most recent) CMP     Component Value Date/Time   NA 135 06/04/2021 1552   K 3.4 (L) 06/04/2021 1552   CL 100 06/04/2021 1552   CO2 27 06/04/2021 1552   GLUCOSE 451 (H) 06/04/2021 1552   BUN 24 (H) 06/04/2021 1552   CREATININE 1.35 (H) 06/04/2021 1552   CALCIUM 8.7 (L) 06/04/2021  1552   PROT 6.7 09/05/2021 0916   ALBUMIN 2.4 (L) 06/04/2021 1552   AST 7 (L) 09/05/2021 0916   ALT 6 09/05/2021 0916   ALKPHOS 93 06/04/2021 1552   BILITOT 0.3 09/05/2021 0916   GFRNONAA 40 (L) 06/04/2021 1552   GFRAA 48 (L) 10/13/2014 0548     Summary of abdominal MRI on August 17, 2021:  IMPRESSION: 1. The right renal area of interest on CT represents a combination of a hemorrhagic/proteinaceous cyst and adjacent calyceal diverticulum. 2. Complex, multi septated cystic lesion within the abdominal retroperitoneum, either adjacent to or exophytic off the right adrenal gland. Differential considerations are broad, included an exophytic adrenal pheochromocytoma, retroperitoneal cystic teratoma, retroperitoneal complicated bronchogenic cyst or even a chronic hematoma. Consider laboratory correlation for pheochromocytoma and subsequent tissue sampling. 3. Increase in intra and extrahepatic biliary duct dilatation, without obstructive cause identified. 4. Relatively similar appearance of main pancreatic duct and side branch duct ectasia since 2012. 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 ERCP may be informative to evaluate both the pancreatic duct and biliary duct dilatation. 5. Large hiatal hernia 6. Tiny bilateral pleural effusions 7.  Aortic Atherosclerosis (ICD10-I70.0).    Assessment & Plan:   1. Retroperitoneal Mass Lesion  - [name]  is being seen at a kind request of Iona Beard, MD. - I have reviewed her available  records and clinically evaluated the patient. - Based on these reviews, she has a large retroperitoneal mass lesion adjacent or abutting the right adrenal gland,  however,  there is not sufficient information to proceed with definitive treatment plan.   Her medical records show that she was evaluated by surgery from [facility] and expectant management was decided.  She has appointment to see them back in 2 weeks.  She is advised to  keep that appointment. Since I do not see a complete documentation of endocrine workup, I have approached her for 24-hour urine collection for metanephrines, catecholamines and cortisol measurement.  Patient  reportedly wears pull-ups, and if urine collection is not satisfactory, she will be considered for free plasma metanephrines to rule out pheochromocytoma/Cushing syndrome.    - I did not initiate any new prescriptions today. - she is advised to maintain close follow up with Iona Beard, MD for primary care needs.   - Time spent with the patient: 45 minutes, of which >50% was spent in  counseling her about her retroperitoneal mass lesion adjacent to the right adrenal gland,  the rest in obtaining information about her symptoms, reviewing her previous labs/studies ( including abstractions from other facilities),  evaluations, and treatments,  and developing a plan to confirm diagnosis and long term treatment based on the latest standards of care/guidelines; and documenting her care.  [name] participated in the discussions, expressed understanding, and voiced agreement with the above plans.  All questions were answered to her satisfaction. she is encouraged to contact clinic should she have any questions or concerns prior to her return visit.  Follow up plan: Return in about 3 months (around 04/29/2022) for A1c -[place].   Glade Lloyd, MD [facility] [address] [place], [place] [address] Phone: [phone]  Fax: [phone]     01/28/2022, 7:46 PM  This note was partially dictated with voice recognition software. Similar sounding words can be transcribed inadequately or may not  be corrected upon review.

## 2022-01-28 NOTE — Patient Instructions (Signed)
                                     Advice for Weight Management  -For most of us the best way to lose weight is by diet management. Generally speaking, diet management means consuming less calories intentionally which over time brings about progressive weight loss.  This can be achieved more effectively by avoiding ultra processed carbohydrates, processed meats, unhealthy fats.    It is critically important to know your numbers: how much calorie you are consuming and how much calorie you need. More importantly, our carbohydrates sources should be unprocessed naturally occurring  complex starch food items.  It is always important to balance nutrition also by  appropriate intake of proteins (mainly plant-based), healthy fats/oils, plenty of fruits and vegetables.   -The American College of Lifestyle Medicine (ACL M) recommends nutrition derived mostly from Whole Food, Plant Predominant Sources example an apple instead of applesauce or apple pie. Eat Plenty of vegetables, Mushrooms, fruits, Legumes, Whole Grains, Nuts, seeds in lieu of processed meats, processed snacks/pastries red meat, poultry, eggs.  Use only water or unsweetened tea for hydration.  The College also recommends the need to stay away from risky substances including alcohol, smoking; obtaining 7-9 hours of restorative sleep, at least 150 minutes of moderate intensity exercise weekly, importance of healthy social connections, and being mindful of stress and seek help when it is overwhelming.    -Sticking to a routine mealtime to eat 3 meals a day and avoiding unnecessary snacks is shown to have a big role in weight control. Under normal circumstances, the only time we burn stored energy is when we are hungry, so allow  some hunger to take place- hunger means no food between appropriate meal times, only water.  It is not advisable to starve.   -It is better to avoid simple carbohydrates including:  Cakes, Sweet Desserts, Ice Cream, Soda (diet and regular), Sweet Tea, Candies, Chips, Cookies, Store Bought Juices, Alcohol in Excess of  1-2 drinks a day, Lemonade,  Artificial Sweeteners, Doughnuts, Coffee Creamers, "Sugar-free" Products, etc, etc.  This is not a complete list.....    -Consulting with certified diabetes educators is proven to provide you with the most accurate and current information on diet.  Also, you may be  interested in discussing diet options/exchanges , we can schedule a visit with [name], RDN, CDE for individualized nutrition education.  -Exercise: If you are able: 30 -60 minutes a day ,4 days a week, or 150 minutes of moderate intensity exercise weekly.    The longer the better if tolerated.  Combine stretch, strength, and aerobic activities.  If you were told in the past that you have high risk for cardiovascular diseases, or if you are currently symptomatic, you may seek evaluation by your heart doctor prior to initiating moderate to intense exercise programs.                                  Additional Care Considerations for Diabetes/Prediabetes   -Diabetes  is a chronic disease.  The most important care consideration is regular follow-up with your diabetes care provider with the goal being avoiding or delaying its complications and to take advantage of advances in medications and technology.  If appropriate actions are taken early enough, type 2 diabetes can even be   reversed.  Seek information from the right source.  - Whole Food, Plant Predominant Nutrition is highly recommended: Eat Plenty of vegetables, Mushrooms, fruits, Legumes, Whole Grains, Nuts, seeds in lieu of processed meats, processed snacks/pastries red meat, poultry, eggs as recommended by American College of  Lifestyle Medicine (ACLM).  -Type 2 diabetes is known to coexist with other important comorbidities such as high blood pressure and high cholesterol.  It is critical to control not only the  diabetes but also the high blood pressure and high cholesterol to minimize and delay the risk of complications including coronary artery disease, stroke, amputations, blindness, etc.  The good news is that this diet recommendation for type 2 diabetes is also very helpful for managing high cholesterol and high blood blood pressure.  - Studies showed that people with diabetes will benefit from a class of medications known as ACE inhibitors and statins.  Unless there are specific reasons not to be on these medications, the standard of care is to consider getting one from these groups of medications at an optimal doses.  These medications are generally considered safe and proven to help protect the heart and the kidneys.    - People with diabetes are encouraged to initiate and maintain regular follow-up with eye doctors, foot doctors, dentists , and if necessary heart and kidney doctors.     - It is highly recommended that people with diabetes quit smoking or stay away from smoking, and get yearly  flu vaccine and pneumonia vaccine at least every 5 years.  See above for additional recommendations on exercise, sleep, stress management , and healthy social connections.      

## 2022-02-02 ENCOUNTER — Encounter: Payer: Self-pay | Admitting: Gastroenterology

## 2022-02-02 ENCOUNTER — Ambulatory Visit (INDEPENDENT_AMBULATORY_CARE_PROVIDER_SITE_OTHER): Payer: 59 | Admitting: Gastroenterology

## 2022-02-02 VITALS — BP 118/78 | HR 76 | Temp 98.3°F | Ht 65.0 in | Wt 173.2 lb

## 2022-02-02 DIAGNOSIS — R112 Nausea with vomiting, unspecified: Secondary | ICD-10-CM

## 2022-02-02 DIAGNOSIS — R197 Diarrhea, unspecified: Secondary | ICD-10-CM | POA: Diagnosis not present

## 2022-02-02 DIAGNOSIS — K838 Other specified diseases of biliary tract: Secondary | ICD-10-CM

## 2022-02-02 MED ORDER — LOPERAMIDE HCL 2 MG PO CAPS
2.0000 mg | ORAL_CAPSULE | Freq: Three times a day (TID) | ORAL | 1 refills | Status: DC | PRN
Start: 2022-02-02 — End: 2022-07-19

## 2022-02-02 MED ORDER — ONDANSETRON HCL 4 MG PO TABS: 4.0000 mg | ORAL_TABLET | Freq: Three times a day (TID) | ORAL | 1 refills | Status: DC | PRN

## 2022-02-02 NOTE — Patient Instructions (Signed)
Please complete labs today. I have sent in RX for Zofran for nausea AND loperamide for diarrhea. Recommend making follow up appointment with Dr. Carlis Abbott at [facility] and with gynecology.   We will be in touch with results and further recommendations.

## 2022-02-02 NOTE — Progress Notes (Signed)
GI Office Note    Referring Provider: Iona Beard, MD Primary Care Physician:  Iona Beard, MD  Primary Gastroenterologist: Garfield Cornea, MD   Chief Complaint   Chief Complaint  Patient presents with   Diarrhea    Also having issues with vomiting.    History of Present Illness   [name] is a 82 y.o. female presenting today for follow up. She is having issues with diarrhea and vomiting.   Patient with complex history. She has history of large hiatal hernia, esophageal dysmotility, dysphagia, GERD. She has history of abnormal CT A/P 05/2021 with enlarged CBD 1.5 cm, enlarged intrahepatic biliary ducts up to 6 mm, diffusely atrophic pancreas. Lobulated 2.4 X 2.7 cm right renal lesion. 6.8 X 5.4 cm left ovarian cystic lesion. 7.8 X 6.1 cm cystic lesion within upper quadrant abutting and compressing the IVC. Lesion abutted caudate lobe and CBD. Nodularity and calcification within lesion. Finding could represent choledochal cyst vs other benign/malignant lesions.   To further evaluate CT findings she had MRI abd with and without contrast completed 08/2021. The Right renal area of interest on CT felt to represent non-malignant process. The complex multiseptated cystic lesion within abdominal retroperitoneum is either adjacent to or exophytic off the right adrenal gland could be exophytic adrenal pheochromocytoma, retroperitoneal cystic teratoma, retroperitoneal complicated bronchogenic cyst or even chronic hematoma. Increase in Intra/extrahepatic biliary duct dilation. Similar appearance of main pancreatic duct and side branch ectasia since 2012. Possibly chronic pancreatitis. LEFT OVARIAN LESION NOT WITHIN VIEW ON MRI ABDOMEN.   She has been seen by Dr. Dorris Fetch, last visit 01/28/2022, endocrine workup negative for functional adrenal mass. Labs ruled out Cushing's syndrome, pheochromocytoma. She has been evaluated by surgery, last seen by Dr. Bailey Mech at [facility] 10/2021 and expectant  management decided.  Plans for 3 month follow up imaging. Per daughter, she needs to make follow up appt. She was apparently due for imaging and ov 01/12/22. She is scheduled to see Dr.Eure (gyn) for left ovarian cystic lesion.   Today: patient complains of cramping lower abdominal pain, just about every day. Not better with stools. Has to sit on the toilet for awhile to have a stool. This weekend however she had vomiting and diarrhea. No ill contacts. Stools are better last 24 hours. She had similar symptoms a few weeks ago. Episodes seem to be recurring. Uses imodium prn. In between episodes she feels fine with no vomiting and normal bowel movements. Appetite is a little better. No melena, brbpr. No heartburn. Some burping. No dysphagia.  No weight loss  EGD 01/2021: -7cm hiatal hernia -mild schatzki ring s/p dilation -gastritis, reactive gastropathy no h.pylori    Medications   Current Outpatient Medications  Medication Sig Dispense Refill   acetaminophen-codeine (TYLENOL #3) 300-30 MG tablet Take 1 tablet by mouth 2 (two) times daily.     aspirin EC 81 MG tablet Take 81 mg by mouth daily.     calcitRIOL (ROCALTROL) 0.25 MCG capsule Take 0.25 mcg by mouth every other day.     carvedilol (COREG) 6.25 MG tablet Take 6.25 mg by mouth 2 (two) times daily.     erythromycin (E-MYCIN) 250 MG tablet Take 250 mg by mouth 3 (three) times daily.     furosemide (LASIX) 20 MG tablet Take 20 mg by mouth 2 (two) times daily. Two tablets qam and 1 tablet qpm     HUMALOG MIX 50/50 KWIKPEN (50-50) 100 UNIT/ML Kwikpen Take 20 Units by mouth  2 (two) times daily before a meal.     ketoconazole (NIZORAL) 2 % cream Apply 1 application topically daily. 100 g 0   lisinopril-hydrochlorothiazide (ZESTORETIC) 20-12.5 MG tablet Take 0.25 tablets by mouth 3 (three) times a week. Mon, Wed, Fri     loperamide (IMODIUM) 2 MG capsule Take 1 capsule (2 mg total) by mouth 4 (four) times daily as needed for diarrhea or loose  stools. 12 capsule 0   metoprolol tartrate (LOPRESSOR) 25 MG tablet Take 12.5 mg by mouth 2 (two) times daily.     olopatadine (PATANOL) 0.1 % ophthalmic solution Place 1 drop into both eyes daily as needed for allergies.      omeprazole (PRILOSEC) 20 MG capsule TAKE ONE (1) CAPSULE BY MOUTH TWICE DAILY BEFORE MEALS 60 capsule 10   potassium chloride SA (K-DUR,KLOR-CON) 20 MEQ tablet Take 20 mEq by mouth daily.     risedronate (ACTONEL) 150 MG tablet Take 150 mg by mouth every 30 (thirty) days. with water on empty stomach, nothing by mouth or lie down for next 30 minutes.     simvastatin (ZOCOR) 5 MG tablet Take 5 mg by mouth at bedtime.     telmisartan (MICARDIS) 20 MG tablet Take 20 mg by mouth daily.     No current facility-administered medications for this visit.    Allergies   Allergies as of 02/02/2022 - Review Complete 02/02/2022  Allergen Reaction Noted   Reglan [metoclopramide]  08/22/2020          Review of Systems   General: Negative for anorexia, weight loss, fever, chills, fatigue, weakness. ENT: Negative for hoarseness, difficulty swallowing , nasal congestion. CV: Negative for chest pain, angina, palpitations, dyspnea on exertion, peripheral edema.  Respiratory: Negative for dyspnea at rest, dyspnea on exertion, cough, sputum, wheezing.  GI: See history of present illness. GU:  Negative for dysuria, hematuria, urinary incontinence, urinary frequency, nocturnal urination.  Endo: Negative for unusual weight change.     Physical Exam   BP 118/78 (BP Location: Left Arm, Patient Position: Sitting, Cuff Size: Large)   Pulse 76   Temp 98.3 F (36.8 C) (Oral)   Ht '5\' 5"'$  (1.651 m)   Wt 173 lb 3.2 oz (78.6 kg)   SpO2 98%   BMI 28.82 kg/m    General: Well-nourished, well-developed in no acute distress.  Eyes: No icterus. Mouth: Oropharyngeal mucosa moist and pink    Abdomen: Bowel sounds are normal, nontender, nondistended, no hepatosplenomegaly or masses,  no  abdominal bruits or hernia , no rebound or guarding.  Rectal: not performed Extremities: No lower extremity edema. No clubbing or deformities. Neuro: Alert and oriented x 4   Skin: Warm and dry, no jaundice.   Psych: Alert and cooperative, normal mood and affect.  Labs   11/2021: iron 67, TIBC 205, iron sat 33, ferritin 21  Imaging Studies   No results found.  Assessment   N/V/D/biliary dilation: recent episode could have been food born or viral related but difficult to sort out. Her N/V could also be secondary to large hiatal hernia, chronic pancreatitis, esophageal dysmotility. She is on chronic EES prescribed by Dr. Merlene Laughter, ?for suspected gastroparesis although no GES on file. There has been concern that large retroperitoneal mass could be contributing to biliary dilation. We will update labs to determine if any evidence of biliary obstruction. Her appetite has not been good and has had diminished oral intake. Will check labs for dehydration. Diarrhea is chronic intermittent. Cannot exclude chronic pancreatitis  contributing.   Retroperitoneal mass: looks like she missed her follow up CT and visit with Dr. Carlis Abbott. I have encouraged them to reschedule.  Left ovarian cystic lesion: she missed appt with gyn, encouraged her to reschedule.   PLAN   Zofran as needed. Continue loperamide.  Follow up with Dr. Carlis Abbott. Follow up with Gyn.  Complete labs.   Laureen Ochs. Bobby Rumpf, [place], [facility]

## 2022-02-03 LAB — CBC WITH DIFFERENTIAL/PLATELET
Basophils Absolute: 0 10*3/uL (ref 0.0–0.2)
Basos: 0 %
EOS (ABSOLUTE): 0.1 10*3/uL (ref 0.0–0.4)
Eos: 1 %
Hematocrit: 41.6 % (ref 34.0–46.6)
Hemoglobin: 13.1 g/dL (ref 11.1–15.9)
Immature Grans (Abs): 0.1 10*3/uL (ref 0.0–0.1)
Immature Granulocytes: 1 %
Lymphocytes Absolute: 1.7 10*3/uL (ref 0.7–3.1)
Lymphs: 17 %
MCH: 25.6 pg — ABNORMAL LOW (ref 26.6–33.0)
MCHC: 31.5 g/dL (ref 31.5–35.7)
MCV: 81 fL (ref 79–97)
Monocytes Absolute: 0.7 10*3/uL (ref 0.1–0.9)
Monocytes: 7 %
Neutrophils Absolute: 7.2 10*3/uL — ABNORMAL HIGH (ref 1.4–7.0)
Neutrophils: 74 %
Platelets: 330 10*3/uL (ref 150–450)
RBC: 5.11 x10E6/uL (ref 3.77–5.28)
RDW: 14.8 % (ref 11.7–15.4)
WBC: 9.8 10*3/uL (ref 3.4–10.8)

## 2022-02-03 LAB — COMPREHENSIVE METABOLIC PANEL
ALT: 11 IU/L (ref 0–32)
AST: 13 IU/L (ref 0–40)
Albumin/Globulin Ratio: 0.7 — ABNORMAL LOW (ref 1.2–2.2)
Albumin: 2.6 g/dL — ABNORMAL LOW (ref 3.7–4.7)
Alkaline Phosphatase: 137 IU/L — ABNORMAL HIGH (ref 44–121)
BUN/Creatinine Ratio: 13 (ref 12–28)
BUN: 25 mg/dL (ref 8–27)
Bilirubin Total: 0.6 mg/dL (ref 0.0–1.2)
CO2: 25 mmol/L (ref 20–29)
Calcium: 9.1 mg/dL (ref 8.7–10.3)
Chloride: 95 mmol/L — ABNORMAL LOW (ref 96–106)
Creatinine, Ser: 1.96 mg/dL — ABNORMAL HIGH (ref 0.57–1.00)
Globulin, Total: 3.8 g/dL (ref 1.5–4.5)
Glucose: 149 mg/dL — ABNORMAL HIGH (ref 70–99)
Potassium: 5.5 mmol/L — ABNORMAL HIGH (ref 3.5–5.2)
Sodium: 136 mmol/L (ref 134–144)
Total Protein: 6.4 g/dL (ref 6.0–8.5)
eGFR: 25 mL/min/{1.73_m2} — ABNORMAL LOW (ref >59)

## 2022-02-03 LAB — LIPASE: Lipase: 9 U/L — ABNORMAL LOW (ref 14–85)

## 2022-02-04 ENCOUNTER — Other Ambulatory Visit: Payer: Self-pay

## 2022-02-04 DIAGNOSIS — R197 Diarrhea, unspecified: Secondary | ICD-10-CM

## 2022-02-04 DIAGNOSIS — R112 Nausea with vomiting, unspecified: Secondary | ICD-10-CM

## 2022-02-04 NOTE — Addendum Note (Signed)
Addended by: Orland Jarred on: 02/04/2022 04:14 PM   Modules accepted: Orders

## 2022-02-06 ENCOUNTER — Encounter: Payer: Self-pay | Admitting: Gastroenterology

## 2022-02-10 ENCOUNTER — Ambulatory Visit (INDEPENDENT_AMBULATORY_CARE_PROVIDER_SITE_OTHER): Payer: 59 | Admitting: Obstetrics & Gynecology

## 2022-02-10 ENCOUNTER — Encounter: Payer: Self-pay | Admitting: Obstetrics & Gynecology

## 2022-02-10 VITALS — BP 107/65 | HR 81

## 2022-02-10 DIAGNOSIS — N83202 Unspecified ovarian cyst, left side: Secondary | ICD-10-CM | POA: Diagnosis not present

## 2022-02-10 NOTE — Progress Notes (Signed)
Follow up appointment for results: Ovarian cyst  Chief Complaint  Patient presents with   Ovarian Cyst    Left ovarian cyst on 06/04/21 ct scan    Blood pressure 107/65, pulse 81.  Narrative & Impression  CLINICAL DATA:  Recent abdominal CT performed for left lower quadrant pain demonstrating an indeterminate right renal lesion and an abdominal retroperitoneal lesion.   EXAM: MRI ABDOMEN WITHOUT AND WITH CONTRAST   TECHNIQUE: Multiplanar multisequence MR imaging of the abdomen was performed both before and after the administration of intravenous contrast.   CONTRAST:  80m MULTIHANCE GADOBENATE DIMEGLUMINE 529 MG/ML IV SOLN   COMPARISON:  CT of 06/04/2021 and MRI of 08/26/2010.   FINDINGS: Lower chest: Large hiatal hernia. Normal heart size without pericardial or pleural effusion. Tiny bilateral pleural effusions.   Hepatobiliary: No suspicious liver lesion. High right hepatic lobe cyst. Normal gallbladder. Mild intrahepatic biliary duct dilatation. Mild common duct dilatation at 11 mm on 22/4, increased from 9 mm in 2012.   Periampullary duodenal diverticulum. No choledocholithiasis or obstructive mass.   Pancreas: Pancreatic main and side-branch duct ectasia, including on 22/10. Present on the 2012 MRI. Surrounding pancreatic atrophy may be slightly increased.   Spleen:  Normal in size, without focal abnormality.   Adrenals/Urinary Tract: Bilateral adrenal thickening. Positioned within the right upper abdominal retroperitoneum, either adjacent to or exophytic off the right adrenal gland, is a multi septated cystic lesion of 7.0 x 5.6 by 8.7 cm including on 16/7 and 16/4. Areas of precontrast T1 hyperintensity on series 12 with enhancement within the smooth septae and an area of nodularity including at 1.3 cm on 54/21.   Bilateral renal cortical thinning. Small simple and complex renal cysts, including an interpolar 7 mm left renal hemorrhagic/proteinaceous  cyst. More focal atrophy involving the upper and lower pole right kidney. Corresponding to the CT abnormality, within the upper pole right kidney, is a nonenhancing 2.7 x 1.8 cm lesion on 62/21 subtracted. This represents a combination of a hemorrhagic/proteinaceous cyst (given precontrast T1 hyperintensity on 62/12) and an adjacent calyceal diverticulum (given contrast pooling on 68/21 delayed).   No suspicious renal mass or hydronephrosis.   Stomach/Bowel: Normal distal most stomach. Normal caliber of abdominal bowel loops. Periampullary duodenal diverticulum.   Vascular/Lymphatic: Aortic atherosclerosis. No retroperitoneal or retrocrural adenopathy.   Other:  No ascites.  No evidence of omental or peritoneal disease.   Musculoskeletal: No acute osseous abnormality.   IMPRESSION: 1. The right renal area of interest on CT represents a combination of a hemorrhagic/proteinaceous cyst and adjacent calyceal diverticulum. 2. Complex, multi septated cystic lesion within the abdominal retroperitoneum, either adjacent to or exophytic off the right adrenal gland. Differential considerations are broad, included an exophytic adrenal pheochromocytoma, retroperitoneal cystic teratoma, retroperitoneal complicated bronchogenic cyst or even a chronic hematoma. Consider laboratory correlation for pheochromocytoma and subsequent tissue sampling. 3. Increase in intra and extrahepatic biliary duct dilatation, without obstructive cause identified. 4. Relatively similar appearance of main pancreatic duct and side branch duct ectasia since 2012. 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 ERCP may be informative to evaluate both the pancreatic duct and biliary duct dilatation. 5. Large hiatal hernia 6. Tiny bilateral pleural effusions 7.  Aortic Atherosclerosis (ICD10-I70.0).     Electronically Signed   By: KAbigail MiyamotoM.D.   On: 08/18/2021 10:00    MEDS ordered this encounter: No orders  of the defined types were placed in this encounter.   Orders for this encounter: Orders Placed This Encounter  Procedures   US PELVIS (TRANSABDOMINAL ONLY)   US PELVIS TRANSVAGINAL NON-OB (TV ONLY)   Ovarian Malignancy Risk-ROMA    Impression + Management Plan   ICD-10-CM   1. Cyst of left ovary: appears benign  N83.202 Ovarian Malignancy Risk-ROMA    US PELVIS (TRANSABDOMINAL ONLY)    US PELVIS TRANSVAGINAL NON-OB (TV ONLY)    US PELVIS TRANSVAGINAL NON-OB (TV ONLY)    US PELVIS (TRANSABDOMINAL ONLY)   was not there 2010(last time pelvis was imaged) appearance is benign, ROMA pending      Follow Up:     All questions were answered.  Past Medical History:  Diagnosis Date   CKD (chronic kidney disease)    Diabetes mellitus    Diastolic heart failure ([place])    GERD (gastroesophageal reflux disease)    Hypertension    Parkinson disease    Scoliosis    Scoliosis    UTI (lower urinary tract infection)     Past Surgical History:  Procedure Laterality Date   BALLOON DILATION N/A 02/21/2021   Procedure: BALLOON DILATION;  Surgeon: Eloise Harman, DO;  [facility];  Service: Endoscopy;  Laterality: N/A;   BIOPSY  02/21/2021   Procedure: BIOPSY;  Surgeon: Eloise Harman, DO;  [facility];  Service: Endoscopy;;   boil Left    boil removed from left cheek   ESOPHAGOGASTRODUODENOSCOPY (EGD) WITH PROPOFOL N/A 02/21/2021   Procedure: ESOPHAGOGASTRODUODENOSCOPY (EGD) WITH PROPOFOL;  Surgeon: Eloise Harman, DO;  Location: [facility];  Service: Endoscopy;  Laterality: N/A;  11:00am   EYE SURGERY     Retina Detachment   MASS EXCISION N/A 06/27/2014   Procedure: EXCISION SUBMANDIBULAR STONE;  Surgeon: Izora Gala, MD;  Location: [facility];  Service: ENT;  Laterality: N/A;   sublingual gland removal  2016   tumor removed from rib      OB History     Gravida  2   Para      Term      Preterm      AB      Living  2      SAB      IAB       Ectopic      Multiple      Live Births              No Known Allergies  Social History   Socioeconomic History   Marital status: Divorced    Spouse name: Not on file   Number of children: Not on file   Years of education: Not on file   Highest education level: Not on file  Occupational History   Not on file  Tobacco Use   Smoking status: Never   Smokeless tobacco: Never  Vaping Use   Vaping Use: Never used  Substance and Sexual Activity   Alcohol use: No    Alcohol/week: 0.0 standard drinks of alcohol   Drug use: No   Sexual activity: Not Currently    Birth control/protection: Post-menopausal, None  Other Topics Concern   Not on file  Social History Narrative   Not on file   Social Determinants of Health   Financial Resource Strain: Low Risk  (02/10/2022)   Overall Financial Resource Strain (CARDIA)    Difficulty of Paying Living Expenses: Not hard at all  Food Insecurity: No Food Insecurity (02/10/2022)   Hunger Vital Sign    Worried About Running Out of Food in the Last Year:  Never true    Ran Out of Food in the Last Year: Never true  Transportation Needs: No Transportation Needs (02/10/2022)   PRAPARE - Hydrologist (Medical): No    Lack of Transportation (Non-Medical): No  Physical Activity: Inactive (02/10/2022)   Exercise Vital Sign    Days of Exercise per Week: 0 days    Minutes of Exercise per Session: 0 min  Stress: No Stress Concern Present (02/10/2022)   [place]    Feeling of Stress : Not at all  Social Connections: Socially Isolated (02/10/2022)   Social Connection and Isolation Panel [NHANES]    Frequency of Communication with Friends and Family: Three times a week    Frequency of Social Gatherings with Friends and Family: Twice a week    Attends Religious Services: Never    Marine scientist or Organizations: No    Attends Archivist  Meetings: Never    Marital Status: Divorced    Family History  Problem Relation Age of Onset   Colon cancer Maternal Grandmother        59   Kidney cancer Daughter 32   Ovarian cancer Daughter 45

## 2022-02-11 LAB — POSTMENOPAUSAL INTERP: HIGH

## 2022-02-11 LAB — OVARIAN MALIGNANCY RISK-ROMA
Cancer Antigen (CA) 125: 22.8 U/mL (ref 0.0–38.1)
HE4: 316 pmol/L — ABNORMAL HIGH (ref 0.0–96.9)
Postmenopausal ROMA: 5.46 — ABNORMAL HIGH
Premenopausal ROMA: 8.69 — ABNORMAL HIGH

## 2022-02-11 LAB — BASIC METABOLIC PANEL
BUN/Creatinine Ratio: 11 — ABNORMAL LOW (ref 12–28)
BUN: 18 mg/dL (ref 8–27)
CO2: 22 mmol/L (ref 20–29)
Calcium: 8.4 mg/dL — ABNORMAL LOW (ref 8.7–10.3)
Chloride: 94 mmol/L — ABNORMAL LOW (ref 96–106)
Creatinine, Ser: 1.7 mg/dL — ABNORMAL HIGH (ref 0.57–1.00)
Glucose: 251 mg/dL — ABNORMAL HIGH (ref 70–99)
Potassium: 4.2 mmol/L (ref 3.5–5.2)
Sodium: 131 mmol/L — ABNORMAL LOW (ref 134–144)
eGFR: 30 mL/min/{1.73_m2} — ABNORMAL LOW (ref >59)

## 2022-02-11 LAB — PREMENOPAUSAL INTERP: HIGH

## 2022-02-16 ENCOUNTER — Emergency Department (HOSPITAL_COMMUNITY)
Admission: EM | Admit: 2022-02-16 | Discharge: 2022-02-16 | Disposition: A | Discharge status: Home / Self Care | Payer: 59 | Attending: Emergency Medicine | Admitting: Emergency Medicine

## 2022-02-16 ENCOUNTER — Encounter (HOSPITAL_COMMUNITY): Payer: Self-pay | Admitting: Emergency Medicine

## 2022-02-16 ENCOUNTER — Emergency Department (HOSPITAL_COMMUNITY): Payer: 59

## 2022-02-16 ENCOUNTER — Other Ambulatory Visit: Payer: Self-pay

## 2022-02-16 DIAGNOSIS — R1032 Left lower quadrant pain: Secondary | ICD-10-CM | POA: Diagnosis present

## 2022-02-16 DIAGNOSIS — R197 Diarrhea, unspecified: Secondary | ICD-10-CM | POA: Diagnosis not present

## 2022-02-16 DIAGNOSIS — Z7982 Long term (current) use of aspirin: Secondary | ICD-10-CM | POA: Diagnosis not present

## 2022-02-16 LAB — COMPREHENSIVE METABOLIC PANEL
ALT: 12 U/L (ref 0–44)
AST: 18 U/L (ref 15–41)
Albumin: 2.1 g/dL — ABNORMAL LOW (ref 3.5–5.0)
Alkaline Phosphatase: 113 U/L (ref 38–126)
Anion gap: 12 (ref 5–15)
BUN: 13 mg/dL (ref 8–23)
CO2: 24 mmol/L (ref 22–32)
Calcium: 7.9 mg/dL — ABNORMAL LOW (ref 8.9–10.3)
Chloride: 96 mmol/L — ABNORMAL LOW (ref 98–111)
Creatinine, Ser: 1.44 mg/dL — ABNORMAL HIGH (ref 0.44–1.00)
GFR, Estimated: 37 mL/min — ABNORMAL LOW (ref >60)
Glucose, Bld: 129 mg/dL — ABNORMAL HIGH (ref 70–99)
Potassium: 4 mmol/L (ref 3.5–5.1)
Sodium: 132 mmol/L — ABNORMAL LOW (ref 135–145)
Total Bilirubin: 1.6 mg/dL — ABNORMAL HIGH (ref 0.3–1.2)
Total Protein: 6.1 g/dL — ABNORMAL LOW (ref 6.5–8.1)

## 2022-02-16 LAB — URINALYSIS, ROUTINE W REFLEX MICROSCOPIC
Bilirubin Urine: NEGATIVE
Glucose, UA: NEGATIVE mg/dL
Hgb urine dipstick: NEGATIVE
Ketones, ur: NEGATIVE mg/dL
Nitrite: NEGATIVE
Protein, ur: NEGATIVE mg/dL
Specific Gravity, Urine: 1.004 — ABNORMAL LOW (ref 1.005–1.030)
WBC, UA: >50 WBC/hpf (ref 0–5)
pH: 8 (ref 5.0–8.0)

## 2022-02-16 LAB — CBC
HCT: 36.2 % (ref 36.0–46.0)
Hemoglobin: 12.2 g/dL (ref 12.0–15.0)
MCH: 26.1 pg (ref 26.0–34.0)
MCHC: 33.7 g/dL (ref 30.0–36.0)
MCV: 77.5 fL — ABNORMAL LOW (ref 80.0–100.0)
Platelets: 290 10*3/uL (ref 150–400)
RBC: 4.67 MIL/uL (ref 3.87–5.11)
RDW: 16.4 % — ABNORMAL HIGH (ref 11.5–15.5)
WBC: 5.3 10*3/uL (ref 4.0–10.5)
nRBC: 0 % (ref 0.0–0.2)

## 2022-02-16 LAB — LIPASE, BLOOD: Lipase: 44 U/L (ref 11–51)

## 2022-02-16 MED ORDER — SODIUM CHLORIDE 0.9 % IV BOLUS
500.0000 mL | Freq: Once | INTRAVENOUS | Status: AC
  Administered 2022-02-16: 500 mL via INTRAVENOUS

## 2022-02-16 MED ORDER — CEPHALEXIN 500 MG PO CAPS: 500.0000 mg | ORAL_CAPSULE | Freq: Three times a day (TID) | ORAL | 0 refills | Status: DC

## 2022-02-16 MED ORDER — IOHEXOL 300 MG/ML  SOLN
100.0000 mL | Freq: Once | INTRAMUSCULAR | Status: AC | PRN
  Administered 2022-02-16: 80 mL via INTRAVENOUS

## 2022-02-16 NOTE — ED Triage Notes (Signed)
Pt BIB [facility] for abd pain starting this am, LBM yesterday, denies n/v

## 2022-02-16 NOTE — ED Notes (Signed)
Recurrent abdominal pain and had MR of abd recently.  Saw OB for cyst on ovary.  And saw GI recently.  Patient complains of lower abd pain but continues to ask for something to eat.

## 2022-02-16 NOTE — Discharge Instructions (Signed)
Stop taking the erythromycin.  Take the cephalexin as directed until finished.  Try to drink plenty of fluids.  Call your GI provider to arrange follow-up appointment.

## 2022-02-16 NOTE — ED Provider Notes (Incomplete)
[facility] Provider Note   CSN: [id number] Arrival date & time: 02/16/22  1216     History {Add pertinent medical, surgical, social history, OB history to HPI:1} Chief Complaint  Patient presents with  . Abdominal Pain    [name] is a 82 y.o. female.   Abdominal Pain      [name] is a 82 y.o. female with past medical history of diabetes, GERD, Parkinson, hypertension, CKD, and diastolic heart failure who presents to the Emergency Department complaining of persistent diarrhea for weeks to months per patient's daughter who is at bedside and provides most of history.  Patient complains of persistent left lower abdominal pain began this morning.  Daughter states that she has had multiple episodes of watery brown stools all weekend.  She states the diarrhea has been worse than baseline over the weekend.  Patient had decreased appetite.  She denies any fever, chills, dysuria, nausea or vomiting.  She had MRI of her abdomen on August 2023 that showed a complex cyst on the abdominal retroperitoneum as well as a right renal area of interest that represents a combination of a hemorrhagic proteinaceous cyst and adjacent calyceal diverticulum.  Home Medications Prior to Admission medications   Medication Sig Start Date End Date Taking? Authorizing Provider  acetaminophen-codeine (TYLENOL #3) 300-30 MG tablet Take 1 tablet by mouth 2 (two) times daily.    [provider]  aspirin EC 81 MG tablet Take 81 mg by mouth daily.    [provider]  calcitRIOL (ROCALTROL) 0.25 MCG capsule Take 0.25 mcg by mouth every other day.    [provider]  carvedilol (COREG) 6.25 MG tablet Take 6.25 mg by mouth 2 (two) times daily. 09/03/21   [provider]  erythromycin (E-MYCIN) 250 MG tablet Take 250 mg by mouth 3 (three) times daily. 01/09/21   [provider]  furosemide (LASIX) 20 MG tablet Take 20 mg by mouth  2 (two) times daily. Two tablets qam and 1 tablet qpm    [provider]  HUMALOG MIX 50/50 KWIKPEN (50-50) 100 UNIT/ML Kwikpen Take 20 Units by mouth 2 (two) times daily before a meal. 06/01/14   [provider]  ketoconazole (NIZORAL) 2 % cream Apply 1 application topically daily. 02/05/21   Jaynee Eagles, PA-C  lisinopril-hydrochlorothiazide (ZESTORETIC) 20-12.5 MG tablet Take 0.25 tablets by mouth 3 (three) times a week. Mon, Wed, Fri 01/09/21   [provider]  loperamide (IMODIUM) 2 MG capsule Take 1 capsule (2 mg total) by mouth 3 (three) times daily as needed for diarrhea or loose stools. 02/02/22   Mahala Menghini, PA-C  metoprolol tartrate (LOPRESSOR) 25 MG tablet Take 12.5 mg by mouth 2 (two) times daily.    [provider]  olopatadine (PATANOL) 0.1 % ophthalmic solution Place 1 drop into both eyes daily as needed for allergies.     [provider]  omeprazole (PRILOSEC) 20 MG capsule TAKE ONE (1) CAPSULE BY MOUTH TWICE DAILY BEFORE MEALS 12/27/21   Mahala Menghini, PA-C  ondansetron (ZOFRAN) 4 MG tablet Take 1 tablet (4 mg total) by mouth every 8 (eight) hours as needed for nausea or vomiting. 02/02/22   Mahala Menghini, PA-C  potassium chloride SA (K-DUR,KLOR-CON) 20 MEQ tablet Take 20 mEq by mouth daily.    [provider]  risedronate (ACTONEL) 150 MG tablet Take 150 mg by mouth every 30 (thirty) days. with water on empty  stomach, nothing by mouth or lie down for next 30 minutes.    [provider]  simvastatin (ZOCOR) 5 MG tablet Take 5 mg by mouth at bedtime. 06/01/14   [provider]  telmisartan (MICARDIS) 20 MG tablet Take 20 mg by mouth daily. 09/03/21   [provider]      Allergies    Patient has no known allergies.    Review of Systems   Review of Systems  Gastrointestinal:  Positive for abdominal pain.    Physical Exam Updated Vital Signs BP (!) 129/93 (BP Location: Right Arm)   Pulse 68    Temp 98.1 F (36.7 C) (Oral)   Resp 17   Ht 5' 5"$  (1.651 m)   Wt 78.5 kg   SpO2 95%   BMI 28.79 kg/m  Physical Exam  ED Results / Procedures / Treatments   Labs (all labs ordered are listed, but only abnormal results are displayed) Labs Reviewed  COMPREHENSIVE METABOLIC PANEL - Abnormal; Notable for the following components:      Result Value   Sodium 132 (*)    Chloride 96 (*)    Glucose, Bld 129 (*)    Creatinine, Ser 1.44 (*)    Calcium 7.9 (*)    Total Protein 6.1 (*)    Albumin 2.1 (*)    Total Bilirubin 1.6 (*)    GFR, Estimated 37 (*)    All other components within normal limits  CBC - Abnormal; Notable for the following components:   MCV 77.5 (*)    RDW 16.4 (*)    All other components within normal limits  URINALYSIS, ROUTINE W REFLEX MICROSCOPIC - Abnormal; Notable for the following components:   Specific Gravity, Urine 1.004 (*)    Leukocytes,Ua LARGE (*)    Bacteria, UA RARE (*)    All other components within normal limits  C DIFFICILE QUICK SCREEN W PCR REFLEX    URINE CULTURE  LIPASE, BLOOD    EKG None  Radiology CT ABDOMEN PELVIS W CONTRAST  Result Date: 02/16/2022 CLINICAL DATA:  Left lower quadrant abdominal pain and diarrhea. EXAM: CT ABDOMEN AND PELVIS WITH CONTRAST TECHNIQUE: Multidetector CT imaging of the abdomen and pelvis was performed using the standard protocol following bolus administration of intravenous contrast. RADIATION DOSE REDUCTION: This exam was performed according to the departmental dose-optimization program which includes automated exposure control, adjustment of the mA and/or kV according to patient size and/or use of iterative reconstruction technique. CONTRAST:  40m OMNIPAQUE IOHEXOL 300 MG/ML  SOLN COMPARISON:  MRI abdomen 08/17/2021. CT abdomen and pelvis 06/04/2021. FINDINGS: Lower chest: There is a trace left pleural effusion. Hepatobiliary: No focal liver abnormality is seen. No gallstones or pericholecystic fluid. There is  mild intra and extrahepatic biliary ductal dilatation which is unchanged. Pancreas: Unremarkable. No pancreatic ductal dilatation or surrounding inflammatory changes. Spleen: Normal in size without focal abnormality. 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 Left adrenal gland is within normal limits. There is no hydronephrosis or perinephric fluid. Right renal cortical scarring is again seen. Bilateral renal cysts are again seen. Complex cystic lesion in the superior pole measuring up 2 2.4 cm appears unchanged. Bladder is within normal limits. Stomach/Bowel: Large hiatal hernia contains the stomach, unchanged. The stomach is nondilated. No evidence of bowel wall thickening, distention, or inflammatory changes. There is sigmoid colon diverticulosis. Vascular/Lymphatic: Aortic atherosclerosis. No enlarged abdominal or pelvic lymph nodes. Reproductive:  7.2 x 5.7 cm left adnexal cyst has not significantly changed. 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 Right adnexa within normal limits. Other: No abdominal wall hernia or abnormality. No abdominopelvic ascites. Musculoskeletal: Degenerative changes affect the spine. IMPRESSION: 1. No acute localizing process in the abdomen or pelvis. 2. Trace left pleural effusion. 3. 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 Please see dedicated MRI report. 4. Stable complex cystic lesion in the superior pole of the left kidney. Please see dedicated MRI report. 5. Stable large hiatal hernia. Aortic Atherosclerosis (ICD10-I70.0). Electronically Signed   By: Ronney Asters M.D.   On: 02/16/2022 17:24    Procedures Procedures  {Document cardiac monitor, telemetry assessment procedure when appropriate:1}  Medications Ordered in ED Medications  iohexol (OMNIPAQUE) 300 MG/ML solution 100 mL (80 mLs Intravenous Contrast Given 02/16/22 1650)  sodium chloride 0.9 %  bolus 500 mL (500 mLs Intravenous New Bag/Given 02/16/22 1852)    ED Course/ Medical Decision Making/ A&P   {   Click here for ABCD2, HEART and other calculatorsREFRESH Note before signing :1}                          Medical Decision Making Amount and/or Complexity of Data Reviewed Labs: ordered. Radiology: ordered.  Risk Prescription drug management.   ***  {Document critical care time when appropriate:1} {Document review of labs and clinical decision tools ie heart score, Chads2Vasc2 etc:1}  {Document your independent review of radiology images, and any outside records:1} {Document your discussion with family members, caretakers, and with consultants:1} {Document social determinants of health affecting pt's care:1} {Document your decision making why or why not admission, treatments were needed:1} Final Clinical Impression(s) / ED Diagnoses Final diagnoses:  None    Rx / DC Orders ED Discharge Orders     None

## 2022-02-17 ENCOUNTER — Ambulatory Visit (HOSPITAL_COMMUNITY)
Admission: RE | Admit: 2022-02-17 | Discharge: 2022-02-17 | Disposition: A | Discharge status: Home / Self Care | Payer: 59 | Source: Ambulatory Visit | Attending: Obstetrics & Gynecology | Admitting: Obstetrics & Gynecology

## 2022-02-17 DIAGNOSIS — N83202 Unspecified ovarian cyst, left side: Secondary | ICD-10-CM | POA: Diagnosis present

## 2022-02-19 LAB — URINE CULTURE: Culture: >=100000 — AB

## 2022-02-20 ENCOUNTER — Telehealth (HOSPITAL_BASED_OUTPATIENT_CLINIC_OR_DEPARTMENT_OTHER): Payer: Self-pay | Admitting: *Deleted

## 2022-02-20 NOTE — ED Provider Notes (Signed)
[facility] Provider Note   CSN: [id number] Arrival date & time: 02/16/22  1216     History  Chief Complaint  Patient presents with   Abdominal Pain    [name] is a 82 y.o. female.   Abdominal Pain Associated symptoms: diarrhea   Associated symptoms: no chest pain, no chills, no dysuria, no fever, no nausea, no shortness of breath and no vomiting        [name] is a 82 y.o. female who presents to the Emergency Department complaining of abdominal pain that began on the morning of arrival.  She describes sharp to crampy pain of her left lower abdomen and pain has been associated with frequent diarrhea.  Stools have been watery and brown in color.  She has been experiencing watery diarrhea for some time, but symptoms have recently worsened.  She is also seeing GI for this.  She denies any black or bloody stools.  No vomiting, fever or chills.  Patient's daughter states that she takes erythromycin 250 mg daily but she is unclear as to exactly what she takes this medication for.  Home Medications Prior to Admission medications   Medication Sig Start Date End Date Taking? Authorizing Provider  cephALEXin (KEFLEX) 500 MG capsule Take 1 capsule (500 mg total) by mouth 3 (three) times daily. 02/16/22  Yes Nysia Dell, PA-C  acetaminophen-codeine (TYLENOL #3) 300-30 MG tablet Take 1 tablet by mouth 2 (two) times daily.    [provider]  aspirin EC 81 MG tablet Take 81 mg by mouth daily.    [provider]  calcitRIOL (ROCALTROL) 0.25 MCG capsule Take 0.25 mcg by mouth every other day.    [provider]  carvedilol (COREG) 6.25 MG tablet Take 6.25 mg by mouth 2 (two) times daily. 09/03/21   [provider]  furosemide (LASIX) 20 MG tablet Take 20 mg by mouth 2 (two) times daily. Two tablets qam and 1 tablet qpm    [provider]  HUMALOG MIX 50/50 KWIKPEN (50-50) 100 UNIT/ML Kwikpen Take  20 Units by mouth 2 (two) times daily before a meal. 06/01/14   [provider]  ketoconazole (NIZORAL) 2 % cream Apply 1 application topically daily. 02/05/21   Jaynee Eagles, PA-C  lisinopril-hydrochlorothiazide (ZESTORETIC) 20-12.5 MG tablet Take 0.25 tablets by mouth 3 (three) times a week. Mon, Wed, Fri 01/09/21   [provider]  loperamide (IMODIUM) 2 MG capsule Take 1 capsule (2 mg total) by mouth 3 (three) times daily as needed for diarrhea or loose stools. 02/02/22   Mahala Menghini, PA-C  metoprolol tartrate (LOPRESSOR) 25 MG tablet Take 12.5 mg by mouth 2 (two) times daily.    [provider]  olopatadine (PATANOL) 0.1 % ophthalmic solution Place 1 drop into both eyes daily as needed for allergies.     [provider]  omeprazole (PRILOSEC) 20 MG capsule TAKE ONE (1) CAPSULE BY MOUTH TWICE DAILY BEFORE MEALS 12/27/21   Mahala Menghini, PA-C  ondansetron (ZOFRAN) 4 MG tablet Take 1 tablet (4 mg total) by mouth every 8 (eight) hours as needed for nausea or vomiting. 02/02/22   Mahala Menghini, PA-C  potassium chloride SA (K-DUR,KLOR-CON) 20 MEQ tablet Take 20 mEq by mouth daily.    [provider]  risedronate (ACTONEL) 150 MG tablet Take 150 mg by mouth every 30 (thirty) days. with water on empty stomach, nothing by mouth or lie down for  next 30 minutes.    [provider]  simvastatin (ZOCOR) 5 MG tablet Take 5 mg by mouth at bedtime. 06/01/14   [provider]  telmisartan (MICARDIS) 20 MG tablet Take 20 mg by mouth daily. 09/03/21   [provider]      Allergies    Patient has no known allergies.    Review of Systems   Review of Systems  Constitutional:  Negative for chills and fever.  Respiratory:  Negative for shortness of breath.   Cardiovascular:  Negative for chest pain.  Gastrointestinal:  Positive for abdominal pain and diarrhea. Negative for blood in stool, nausea and vomiting.  Genitourinary:  Negative for  dysuria and flank pain.  Musculoskeletal:  Negative for back pain and myalgias.  Skin:  Negative for rash.  Neurological:  Negative for dizziness, weakness, numbness and headaches.    Physical Exam Updated Vital Signs BP (!) 140/82   Pulse 67   Temp 98 F (36.7 C)   Resp 16   Ht 5' 5"$  (1.651 m)   Wt 78.5 kg   SpO2 99%   BMI 28.79 kg/m  Physical Exam Vitals and nursing note reviewed.  Constitutional:      Appearance: She is well-developed. She is not ill-appearing.  HENT:     Mouth/Throat:     Mouth: Mucous membranes are moist.  Cardiovascular:     Rate and Rhythm: Normal rate and regular rhythm.  Pulmonary:     Effort: Pulmonary effort is normal.     Breath sounds: Normal breath sounds.  Abdominal:     General: There is no distension.     Palpations: Abdomen is soft.     Tenderness: There is abdominal tenderness. There is no right CVA tenderness, left CVA tenderness or guarding.  Musculoskeletal:     Right lower leg: No edema.     Left lower leg: No edema.  Skin:    General: Skin is warm.     Capillary Refill: Capillary refill takes less than 2 seconds.     Findings: No rash.  Neurological:     General: No focal deficit present.     Mental Status: She is alert.     Sensory: No sensory deficit.     Motor: No weakness.     ED Results / Procedures / Treatments   Labs (all labs ordered are listed, but only abnormal results are displayed) Labs Reviewed  URINE CULTURE - Abnormal; Notable for the following components:      Result Value   Culture >=100,000 COLONIES/mL ESCHERICHIA COLI (*)    Organism ID, Bacteria ESCHERICHIA COLI (*)    All other components within normal limits  COMPREHENSIVE METABOLIC PANEL - Abnormal; Notable for the following components:   Sodium 132 (*)    Chloride 96 (*)    Glucose, Bld 129 (*)    Creatinine, Ser 1.44 (*)    Calcium 7.9 (*)    Total Protein 6.1 (*)    Albumin 2.1 (*)    Total Bilirubin 1.6 (*)    GFR, Estimated 37 (*)     All other components within normal limits  CBC - Abnormal; Notable for the following components:   MCV 77.5 (*)    RDW 16.4 (*)    All other components within normal limits  URINALYSIS, ROUTINE W REFLEX MICROSCOPIC - Abnormal; Notable for the following components:   Specific Gravity, Urine 1.004 (*)    Leukocytes,Ua LARGE (*)    Bacteria, UA RARE (*)  All other components within normal limits  LIPASE, BLOOD    EKG None  Radiology No results found.  Procedures Procedures    Medications Ordered in ED Medications  iohexol (OMNIPAQUE) 300 MG/ML solution 100 mL (80 mLs Intravenous Contrast Given 02/16/22 1650)  sodium chloride 0.9 % bolus 500 mL (0 mLs Intravenous Stopped 02/16/22 1942)    ED Course/ Medical Decision Making/ A&P                             Medical Decision Making Patient with known history of recurrent diarrhea here with worsening symptoms.  Also having left lower quadrant pain.  No fever nausea or vomiting.  No melena or hematochezia.  Patient is followed by GI.  Patient's daughter who also provides history information, states her mother has been taking erythromycin daily for some time but daughter is unsure of what she takes this medication for and patient is unsure  On my exam, she is nontoxic-appearing, there is some diffuse tenderness of the abdomen, mainly in the left lower quadrant.  There is no guarding or rebound tenderness.  Mucous membranes are moist.  Differential would include but not limited to acute diverticulitis, SBO, fecal impaction, C. difficile infection.  Amount and/or Complexity of Data Reviewed Labs: ordered.    Details: Labs interpreted by me, no evidence of leukocytosis and her hemoglobin is reassuring.  Lipase unremarkable.  Chemistries show mildly increased total bilirubin and serum creatinine elevated but improved from 2 weeks ago.  C. difficile PCR pending Radiology: ordered.    Details: CT abdomen and pelvis ordered for further  evaluation of patient's abdominal pain and diarrhea.  CT findings without acute intra-abdominal or pelvic finding Discussion of management or test interpretation with external provider(s): Patient with recurrent diarrhea left lower abdominal pain without clear cause.  She has been given IV fluids.  Vital signs are reassuring.  Workup without evidence of acute blood loss.  Urinalysis shows possible UTI, culture pending.  C. difficile test pending so far, patient unable to provide sample.  For now, will have patient discontinue erythromycin and she is agreeable to close outpatient follow-up with GI.  Will treat with cephalexin for UTI.  Return precautions were discussed.   Risk Prescription drug management.           Final Clinical Impression(s) / ED Diagnoses Final diagnoses:  Diarrhea, unspecified type  Left lower quadrant abdominal pain    Rx / DC Orders ED Discharge Orders          Ordered    cephALEXin (KEFLEX) 500 MG capsule  3 times daily        02/16/22 2101              Kem Parkinson, PA-C 02/20/22 1354    Godfrey Pick, MD 02/21/22 1119

## 2022-02-20 NOTE — Telephone Encounter (Signed)
Post ED Visit - Positive Culture Follow-up  Culture report reviewed by antimicrobial stewardship pharmacist: [place] Team []$  Elenor Quinones, Pharm.D. []$  Heide Guile, Pharm.D., BCPS AQ-ID []$  Parks Neptune, Pharm.D., BCPS []$  Alycia Rossetti, Pharm.D., BCPS []$  [place], Pharm.D., BCPS, AAHIVP []$  Legrand Como, Pharm.D., BCPS, AAHIVP []$  Salome Arnt, PharmD, BCPS []$  Johnnette Gourd, PharmD, BCPS []$  Hughes Better, PharmD, BCPS []$  Leeroy Cha, PharmD []$  Laqueta Linden, PharmD, BCPS []$  Albertina Parr, PharmD  [place] Team []$  Leodis Sias, PharmD []$  Lindell Spar, PharmD []$  Royetta Asal, PharmD []$  Graylin Shiver, Rph []$  Rema Fendt) Glennon Mac, PharmD []$  Arlyn Dunning, PharmD []$  Netta Cedars, PharmD []$  Dia Sitter, PharmD []$  Leone Haven, PharmD []$  Gretta Arab, PharmD []$  Theodis Shove, PharmD []$  Peggyann Juba, PharmD []$  Reuel Boom, PharmD   Positive urine culture Treated with Cephalexin, organism sensitive to the same and no further patient follow-up is required at this time.  Arturo Morton, PharmD  Harlon Flor Talley 02/20/2022, 11:11 AM

## 2022-02-26 ENCOUNTER — Encounter: Payer: Self-pay | Admitting: Obstetrics & Gynecology

## 2022-02-26 ENCOUNTER — Ambulatory Visit (INDEPENDENT_AMBULATORY_CARE_PROVIDER_SITE_OTHER): Payer: 59 | Admitting: Obstetrics & Gynecology

## 2022-02-26 VITALS — BP 122/80 | HR 74

## 2022-02-26 DIAGNOSIS — N83202 Unspecified ovarian cyst, left side: Secondary | ICD-10-CM

## 2022-02-26 NOTE — Progress Notes (Signed)
Follow up appointment for results: ROMA  Chief Complaint  Patient presents with   Follow-up    U/S results    Blood pressure 122/80, pulse 74.  Ca 125 22.8 HE4 361  ^Ovarian cancer risk  Mass unchanged x 10 years appears benign no solid component of septrations  MEDS ordered this encounter: No orders of the defined types were placed in this encounter.   Orders for this encounter: No orders of the defined types were placed in this encounter.   Impression + Management Plan   ICD-10-CM   1. 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     All questions were answered.  Past Medical History:  Diagnosis Date   CKD (chronic kidney disease)    Diabetes mellitus    Diastolic heart failure ([place])    GERD (gastroesophageal reflux disease)    Hypertension    Parkinson disease    Scoliosis    Scoliosis    UTI (lower urinary tract infection)     Past Surgical History:  Procedure Laterality Date   BALLOON DILATION N/A 02/21/2021   Procedure: BALLOON DILATION;  Surgeon: Eloise Harman, DO;  [facility];  Service: Endoscopy;  Laterality: N/A;   BIOPSY  02/21/2021   Procedure: BIOPSY;  Surgeon: Eloise Harman, DO;  [facility];  Service: Endoscopy;;   boil Left    boil removed from left cheek   ESOPHAGOGASTRODUODENOSCOPY (EGD) WITH PROPOFOL N/A 02/21/2021   Procedure: ESOPHAGOGASTRODUODENOSCOPY (EGD) WITH PROPOFOL;  Surgeon: Eloise Harman, DO;  Location: [facility];  Service: Endoscopy;  Laterality: N/A;  11:00am   EYE SURGERY     Retina Detachment   MASS EXCISION N/A 06/27/2014   Procedure: EXCISION SUBMANDIBULAR STONE;  Surgeon: Izora Gala, MD;  Location: [facility];  Service: ENT;  Laterality: N/A;    sublingual gland removal  2016   tumor removed from rib      OB History     Gravida  2   Para      Term      Preterm      AB      Living  2      SAB      IAB      Ectopic      Multiple      Live Births              No Known Allergies  Social History   Socioeconomic History   Marital status: Divorced    Spouse name: Not on file   Number of children: Not on file   Years of education: Not on file   Highest education level: Not on file  Occupational History   Not on file  Tobacco Use   Smoking status: Never   Smokeless tobacco: Never  Vaping Use   Vaping Use: Never used  Substance and Sexual Activity   Alcohol use: No    Alcohol/week: 0.0 standard drinks of alcohol   Drug use: No   Sexual activity: Not Currently    Birth control/protection: Post-menopausal, None  Other Topics Concern   Not on file  Social History Narrative   Not on file   Social Determinants of Health  Financial Resource Strain: Low Risk  (02/10/2022)   Overall Financial Resource Strain (CARDIA)    Difficulty of Paying Living Expenses: Not hard at all  Food Insecurity: No Food Insecurity (02/10/2022)   Hunger Vital Sign    Worried About Running Out of Food in the Last Year: Never true    Ran Out of Food in the Last Year: Never true  Transportation Needs: No Transportation Needs (02/10/2022)   PRAPARE - Hydrologist (Medical): No    Lack of Transportation (Non-Medical): No  Physical Activity: Inactive (02/10/2022)   Exercise Vital Sign    Days of Exercise per Week: 0 days    Minutes of Exercise per Session: 0 min  Stress: No Stress Concern Present (02/10/2022)   [place]    Feeling of Stress : Not at all  Social Connections: Socially Isolated (02/10/2022)   Social Connection and Isolation Panel [NHANES]    Frequency of Communication with Friends and Family: Three times a week     Frequency of Social Gatherings with Friends and Family: Twice a week    Attends Religious Services: Never    Marine scientist or Organizations: No    Attends Archivist Meetings: Never    Marital Status: Divorced    Family History  Problem Relation Age of Onset   Colon cancer Maternal Grandmother        60   Kidney cancer Daughter 43   Ovarian cancer Daughter 31

## 2022-03-12 ENCOUNTER — Telehealth: Payer: Self-pay

## 2022-03-12 NOTE — Telephone Encounter (Signed)
Pt's daughter called stating that the pt is not eating and is having abdominal pain and nausea. Denies any issues with bowels, vomiting, fever, or blood in stools.

## 2022-03-13 NOTE — Telephone Encounter (Signed)
Is this the same symptoms she has been having? Lower abdominal pain and nausea? Is she taking her Zofran for nausea? Unfortunately, we are unable to see her in the office for evaluation today. If she is not able to tolerate p.o., recommend ER evaluation.

## 2022-03-13 NOTE — Telephone Encounter (Signed)
The pain is the same as she was having and daughter states that she is taking the zofran. Advised daughter to take pt to the ER for evaluation. Daughter verbalized understanding.

## 2022-03-17 NOTE — Telephone Encounter (Signed)
Reviewed records of ED visit in 02/2022 since my ov, as well as gyn and nephrology visit. Patient has had follow up CT and pelvic u/s 02/2022.  She is very complex with large hiatal hernia, pancreatic atrophy, biliary dilation, chronic left ovarian cystic lesion as well as complex multiseptated cystic lesion within abdominal retroperitoneum either adjacent to or exophytic off right adrenal gland. Any of these issues could be contributing to her symptoms.    --->please make sure they were able to make follow up appt with Dr. Bailey Mech at [facility] as planned. --->please arrange for Dr. Gala Romney to see her in the office for follow up --->if she is unable to eat/drink or has fever, or worsening pain she should go to the ED. I don't believe she did last week after advised to go to ED.

## 2022-03-17 NOTE — Telephone Encounter (Signed)
Lmom for pt to return my call, appt with Dr Gala Romney was scheduled for 05/05/22

## 2022-03-18 NOTE — Telephone Encounter (Signed)
Noted. Thanks.

## 2022-03-18 NOTE — Telephone Encounter (Signed)
Pt's daughter [name] ([place] on file) was made aware and verbalized understanding. Daughter stated that she has a follow up with Dr. Carlis Abbott scheduled in the near future.

## 2022-04-17 ENCOUNTER — Emergency Department (HOSPITAL_COMMUNITY): Payer: 59

## 2022-04-17 ENCOUNTER — Emergency Department (HOSPITAL_COMMUNITY)
Admission: EM | Admit: 2022-04-17 | Discharge: 2022-04-17 | Disposition: A | Discharge status: Home / Self Care | Payer: 59 | Attending: Emergency Medicine | Admitting: Emergency Medicine

## 2022-04-17 ENCOUNTER — Other Ambulatory Visit: Payer: Self-pay

## 2022-04-17 ENCOUNTER — Encounter (HOSPITAL_COMMUNITY): Payer: Self-pay | Admitting: Radiology

## 2022-04-17 DIAGNOSIS — M791 Myalgia, unspecified site: Secondary | ICD-10-CM | POA: Diagnosis present

## 2022-04-17 DIAGNOSIS — R1084 Generalized abdominal pain: Secondary | ICD-10-CM | POA: Diagnosis not present

## 2022-04-17 DIAGNOSIS — E860 Dehydration: Secondary | ICD-10-CM | POA: Insufficient documentation

## 2022-04-17 DIAGNOSIS — I129 Hypertensive chronic kidney disease with stage 1 through stage 4 chronic kidney disease, or unspecified chronic kidney disease: Secondary | ICD-10-CM | POA: Diagnosis not present

## 2022-04-17 DIAGNOSIS — G20C Parkinsonism, unspecified: Secondary | ICD-10-CM | POA: Diagnosis not present

## 2022-04-17 DIAGNOSIS — N189 Chronic kidney disease, unspecified: Secondary | ICD-10-CM | POA: Insufficient documentation

## 2022-04-17 DIAGNOSIS — Z79899 Other long term (current) drug therapy: Secondary | ICD-10-CM | POA: Insufficient documentation

## 2022-04-17 DIAGNOSIS — E1122 Type 2 diabetes mellitus with diabetic chronic kidney disease: Secondary | ICD-10-CM | POA: Insufficient documentation

## 2022-04-17 DIAGNOSIS — Z794 Long term (current) use of insulin: Secondary | ICD-10-CM | POA: Insufficient documentation

## 2022-04-17 DIAGNOSIS — N179 Acute kidney failure, unspecified: Secondary | ICD-10-CM | POA: Diagnosis not present

## 2022-04-17 DIAGNOSIS — G8929 Other chronic pain: Secondary | ICD-10-CM | POA: Insufficient documentation

## 2022-04-17 DIAGNOSIS — Z7982 Long term (current) use of aspirin: Secondary | ICD-10-CM | POA: Insufficient documentation

## 2022-04-17 LAB — COMPREHENSIVE METABOLIC PANEL
ALT: 14 U/L (ref 0–44)
AST: 18 U/L (ref 15–41)
Albumin: 1.8 g/dL — ABNORMAL LOW (ref 3.5–5.0)
Alkaline Phosphatase: 130 U/L — ABNORMAL HIGH (ref 38–126)
Anion gap: 10 (ref 5–15)
BUN: 41 mg/dL — ABNORMAL HIGH (ref 8–23)
CO2: 23 mmol/L (ref 22–32)
Calcium: 8 mg/dL — ABNORMAL LOW (ref 8.9–10.3)
Chloride: 100 mmol/L (ref 98–111)
Creatinine, Ser: 2.32 mg/dL — ABNORMAL HIGH (ref 0.44–1.00)
GFR, Estimated: 21 mL/min — ABNORMAL LOW (ref >60)
Glucose, Bld: 186 mg/dL — ABNORMAL HIGH (ref 70–99)
Potassium: 4.1 mmol/L (ref 3.5–5.1)
Sodium: 133 mmol/L — ABNORMAL LOW (ref 135–145)
Total Bilirubin: 0.7 mg/dL (ref 0.3–1.2)
Total Protein: 6 g/dL — ABNORMAL LOW (ref 6.5–8.1)

## 2022-04-17 LAB — URINALYSIS, ROUTINE W REFLEX MICROSCOPIC
Bacteria, UA: NONE SEEN
Bilirubin Urine: NEGATIVE
Glucose, UA: NEGATIVE mg/dL
Ketones, ur: NEGATIVE mg/dL
Leukocytes,Ua: NEGATIVE
Nitrite: NEGATIVE
Protein, ur: NEGATIVE mg/dL
Specific Gravity, Urine: 1.009 (ref 1.005–1.030)
pH: 5 (ref 5.0–8.0)

## 2022-04-17 LAB — CBC WITH DIFFERENTIAL/PLATELET
Abs Immature Granulocytes: 0.02 K/uL (ref 0.00–0.07)
Basophils Absolute: 0 K/uL (ref 0.0–0.1)
Basophils Relative: 1 %
Eosinophils Absolute: 0.1 K/uL (ref 0.0–0.5)
Eosinophils Relative: 3 %
HCT: 36.7 % (ref 36.0–46.0)
Hemoglobin: 12.7 g/dL (ref 12.0–15.0)
Immature Granulocytes: 0 %
Lymphocytes Relative: 26 %
Lymphs Abs: 1.4 K/uL (ref 0.7–4.0)
MCH: 28 pg (ref 26.0–34.0)
MCHC: 34.6 g/dL (ref 30.0–36.0)
MCV: 80.8 fL (ref 80.0–100.0)
Monocytes Absolute: 0.6 K/uL (ref 0.1–1.0)
Monocytes Relative: 11 %
Neutro Abs: 3.1 K/uL (ref 1.7–7.7)
Neutrophils Relative %: 59 %
Platelets: 351 K/uL (ref 150–400)
RBC: 4.54 MIL/uL (ref 3.87–5.11)
RDW: 18.6 % — ABNORMAL HIGH (ref 11.5–15.5)
WBC: 5.2 K/uL (ref 4.0–10.5)
nRBC: 0 % (ref 0.0–0.2)

## 2022-04-17 LAB — LIPASE, BLOOD: Lipase: 23 U/L (ref 11–51)

## 2022-04-17 MED ORDER — MORPHINE SULFATE (PF) 4 MG/ML IV SOLN
4.0000 mg | Freq: Once | INTRAVENOUS | Status: AC
  Administered 2022-04-17: 4 mg via INTRAVENOUS
  Filled 2022-04-17: qty 1

## 2022-04-17 MED ORDER — SODIUM CHLORIDE 0.9 % IV BOLUS
1000.0000 mL | Freq: Once | INTRAVENOUS | Status: AC
  Administered 2022-04-17: 1000 mL via INTRAVENOUS

## 2022-04-17 MED ORDER — ONDANSETRON HCL 4 MG/2ML IJ SOLN
4.0000 mg | Freq: Once | INTRAMUSCULAR | Status: AC
  Administered 2022-04-17: 4 mg via INTRAVENOUS
  Filled 2022-04-17: qty 2

## 2022-04-17 MED ORDER — HYDROCODONE-ACETAMINOPHEN 5-325 MG PO TABS: 1.0000 | ORAL_TABLET | ORAL | 0 refills | Status: DC | PRN

## 2022-04-17 NOTE — ED Notes (Signed)
Pt went for CT scan

## 2022-04-17 NOTE — ED Triage Notes (Signed)
Patient brought in by EMS from home complaining of generalized chronic pain. Pt is A&O x4. NAD

## 2022-04-17 NOTE — ED Provider Notes (Signed)
[facility] AT [facility] Provider Note   CSN: [id number] Arrival date & time: 04/17/22  1126     History  Chief Complaint  Patient presents with   Back Pain   Hip Pain   Shoulder Pain    Pt complains of hurting everywhere    [name] is a 82 y.o. female.  Pt is a 82 yo female with pmhx significant for dm, htn, uti, gerd, scoliosis, parkinson's disease, and ckd.  Pt comes to the ED today with complaints of pain all over.  Pt said there is nothing new about pain.  Pt has been taking codeine for pain.  This is not helping.       Home Medications Prior to Admission medications   Medication Sig Start Date End Date Taking? Authorizing Provider  acetaminophen-codeine (TYLENOL #3) 300-30 MG tablet Take 1 tablet by mouth 2 (two) times daily.   Yes [provider]  aspirin EC 81 MG tablet Take 81 mg by mouth daily.   Yes [provider]  calcitRIOL (ROCALTROL) 0.25 MCG capsule Take 0.25 mcg by mouth every other day.   Yes [provider]  carvedilol (COREG) 3.125 MG tablet Take 3.125 mg by mouth 2 (two) times daily. 09/03/21  Yes [provider]  furosemide (LASIX) 20 MG tablet Take 20 mg by mouth 2 (two) times daily. Two tablets qam and 1 tablet qpm   Yes [provider]  HUMALOG MIX 50/50 KWIKPEN (50-50) 100 UNIT/ML Kwikpen Take 20-40 Units by mouth 2 (two) times daily before a meal. 40units in the morning and 20 units in the evening 06/01/14  Yes [provider]  HYDROcodone-acetaminophen (NORCO/VICODIN) 5-325 MG tablet Take 1 tablet by mouth every 4 (four) hours as needed. 04/17/22  Yes Jacalyn Lefevre, MD  loperamide (IMODIUM) 2 MG capsule Take 1 capsule (2 mg total) by mouth 3 (three) times daily as needed for diarrhea or loose stools. 02/02/22  Yes Tiffany Kocher, PA-C  omeprazole (PRILOSEC) 20 MG capsule TAKE ONE (1) CAPSULE BY MOUTH TWICE DAILY BEFORE MEALS 12/27/21  Yes Tiffany Kocher, PA-C   ondansetron (ZOFRAN) 4 MG tablet Take 1 tablet (4 mg total) by mouth every 8 (eight) hours as needed for nausea or vomiting. 02/02/22  Yes Tiffany Kocher, PA-C  potassium chloride SA (K-DUR,KLOR-CON) 20 MEQ tablet Take 20 mEq by mouth daily.   Yes [provider]  risedronate (ACTONEL) 150 MG tablet Take 150 mg by mouth every 30 (thirty) days. with water on empty stomach, nothing by mouth or lie down for next 30 minutes.   Yes [provider]  simvastatin (ZOCOR) 5 MG tablet Take 5 mg by mouth at bedtime. 06/01/14  Yes [provider]  telmisartan (MICARDIS) 20 MG tablet Take 20 mg by mouth daily. 09/03/21  Yes [provider]  cephALEXin (KEFLEX) 500 MG capsule Take 1 capsule (500 mg total) by mouth 3 (three) times daily. 02/16/22   Triplett, Tammy, PA-C  ketoconazole (NIZORAL) 2 % cream Apply 1 application topically daily. 02/05/21   Wallis Bamberg, PA-C      Allergies    Patient has no known allergies.    Review of Systems   Review of Systems  Musculoskeletal:  Positive for myalgias.  All other systems reviewed and are negative.   Physical Exam Updated Vital Signs BP 124/70   Pulse 60   Temp 97.9 F (36.6 C) (Oral)   Resp 18   SpO2 100%  Physical Exam Vitals and nursing note reviewed.  Constitutional:      Appearance: Normal appearance.  HENT:     Head: Normocephalic and atraumatic.     Right Ear: External ear normal.     Left Ear: External ear normal.     Nose: Nose normal.     Mouth/Throat:     Mouth: Mucous membranes are dry.  Eyes:     Extraocular Movements: Extraocular movements intact.     Conjunctiva/sclera: Conjunctivae normal.     Pupils: Pupils are equal, round, and reactive to light.  Cardiovascular:     Rate and Rhythm: Normal rate and regular rhythm.     Pulses: Normal pulses.     Heart sounds: Normal heart sounds.  Pulmonary:     Effort: Pulmonary effort is normal.     Breath sounds: Normal breath sounds.  Abdominal:      General: Abdomen is flat. Bowel sounds are normal.     Palpations: Abdomen is soft.     Tenderness: There is generalized abdominal tenderness.     Comments: Mild, diffuse, abd pain  Musculoskeletal:        General: Normal range of motion.     Cervical back: Normal range of motion.  Skin:    General: Skin is warm.     Capillary Refill: Capillary refill takes less than 2 seconds.  Neurological:     General: No focal deficit present.     Mental Status: She is alert and oriented to person, place, and time.  Psychiatric:        Mood and Affect: Mood normal.        Behavior: Behavior normal.        Thought Content: Thought content normal.     ED Results / Procedures / Treatments   Labs (all labs ordered are listed, but only abnormal results are displayed) Labs Reviewed  CBC WITH DIFFERENTIAL/PLATELET - Abnormal; Notable for the following components:      Result Value   RDW 18.6 (*)    All other components within normal limits  COMPREHENSIVE METABOLIC PANEL - Abnormal; Notable for the following components:   Sodium 133 (*)    Glucose, Bld 186 (*)    BUN 41 (*)    Creatinine, Ser 2.32 (*)    Calcium 8.0 (*)    Total Protein 6.0 (*)    Albumin 1.8 (*)    Alkaline Phosphatase 130 (*)    GFR, Estimated 21 (*)    All other components within normal limits  URINALYSIS, ROUTINE W REFLEX MICROSCOPIC - Abnormal; Notable for the following components:   APPearance HAZY (*)    Hgb urine dipstick LARGE (*)    All other components within normal limits  LIPASE, BLOOD    EKG None  Radiology CT ABDOMEN PELVIS WO CONTRAST  Result Date: 04/17/2022 CLINICAL DATA:  Abdominal pain EXAM: CT ABDOMEN AND PELVIS WITHOUT CONTRAST TECHNIQUE: Multidetector CT imaging of the abdomen and pelvis was performed following the standard protocol without IV contrast. RADIATION DOSE REDUCTION: This exam was performed according to the departmental dose-optimization program which includes automated exposure  control, adjustment of the mA and/or kV according to patient size and/or use of iterative reconstruction technique. COMPARISON:  CT abdomen pelvis, 02/16/2022 MR abdomen, 08/27/2021, pelvic ultrasound, 02/17/2022 FINDINGS: Lower chest: No acute abnormality. 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 Coronary artery calcifications. Trace bilateral pleural effusions and associated atelectasis or consolidation. Hepatobiliary: No solid liver abnormality is seen. No  gallstones. Unchanged intra and extrahepatic biliary ductal dilatation. Pancreas: Unremarkable. No pancreatic ductal dilatation or surrounding inflammatory changes. Spleen: Normal in size without significant abnormality. Adrenals/Urinary Tract: Adrenal glands are unremarkable. Kidneys are normal, without renal calculi, solid lesion, or hydronephrosis. Bladder is unremarkable. Stomach/Bowel: Stomach is within normal limits. Descending duodenal diverticulum. Appendix appears normal. No evidence of bowel wall thickening, distention, or inflammatory changes. Descending and sigmoid diverticulosis. Vascular/Lymphatic: Aortic atherosclerosis. No enlarged abdominal or pelvic lymph nodes. Reproductive: No mass mass. Unchanged left ovarian or adnexal cyst measuring 7.3 x 5.7 cm (series 2, 63). Other: No abdominal wall hernia. Anasarca. No ascites. 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 Musculoskeletal: No acute or significant osseous findings. IMPRESSION: 1. No acute noncontrast CT findings of the abdomen or pelvis to explain abdominal pain. 2. Descending and sigmoid diverticulosis without evidence of acute diverticulitis. 3. 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 4. 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 This is of uncertain nature, and previously characterized by dedicated MR. 5. Unchanged intra and extrahepatic biliary ductal dilatation, which may be secondary to mass effect from retroperitoneal mass described above. Correlate for biochemical evidence of biliary obstruction. 6. Unchanged left ovarian or adnexal cyst measuring 7.3 x 5.7 cm, previously assessed by dedicated pelvic ultrasound. 7. Trace bilateral pleural effusions and associated atelectasis or consolidation. 8. Anasarca. 9. Coronary artery disease. Aortic Atherosclerosis (ICD10-I70.0). Electronically Signed   By: Jearld Lesch M.D.   On: 04/17/2022 15:41    Procedures Procedures    Medications Ordered in ED Medications  morphine (PF) 4 MG/ML injection 4 mg (has no administration in time range)  morphine (PF) 4 MG/ML injection 4 mg (4 mg Intravenous Given 04/17/22 1306)  ondansetron (ZOFRAN) injection 4 mg (4 mg Intravenous Given 04/17/22 1259)  sodium chloride 0.9 % bolus 1,000 mL (1,000 mLs Intravenous New Bag/Given 04/17/22 1313)    ED Course/ Medical Decision Making/ A&P                             Medical Decision Making Amount and/or Complexity of Data Reviewed Labs: ordered. Radiology: ordered.  Risk Prescription drug management.   This patient presents to the ED for concern of myalgias, this involves an extensive number of treatment options, and is a complaint that carries with it a high risk of complications and morbidity.  The differential diagnosis includes infection, electrolyte abn   Co morbidities that complicate the patient evaluation  dm, htn, uti, gerd, scoliosis, parkinson's disease, and ckd   Additional history obtained:  Additional history obtained from epic chart review External records from outside source obtained and reviewed including EMS report   Lab Tests:  I Ordered, and personally interpreted labs.  The pertinent results  include:  cbc nl, cmp with bun 41 and cr 2.32 (cr 1.44 on 2/12); ua with some blood, but it was a cath   Imaging Studies ordered:  I ordered imaging studies including ct  I independently visualized and interpreted imaging which showed   No acute noncontrast CT findings of the abdomen or pelvis to  explain abdominal pain.  2. Descending and sigmoid diverticulosis without evidence of acute  diverticulitis.  3. Large paraesophageal hernia with intrathoracic position of the  stomach.  4. Unchanged large, internally septated cystic lesion of the right  retroperitoneum near the right diaphragmatic crus and adrenal gland  measuring 7.9 x 5.9 cm. This is of uncertain nature, and previously  characterized by dedicated MR.  5. Unchanged intra and extrahepatic biliary ductal dilatation, which  may be secondary to mass effect from retroperitoneal mass described  above. Correlate for biochemical evidence of biliary obstruction.  6. Unchanged left ovarian or adnexal cyst measuring 7.3 x 5.7 cm,  previously assessed by dedicated pelvic ultrasound.  7. Trace bilateral pleural effusions and associated atelectasis or  consolidation.  8. Anasarca.  9. Coronary artery disease.    Aortic Atherosclerosis (ICD10-I70.0).   I agree with the radiologist interpretation   Cardiac Monitoring:  The patient was maintained on a cardiac monitor.  I personally viewed and interpreted the cardiac monitored which showed an underlying rhythm of: nsr   Medicines ordered and prescription drug management:  I ordered medication including ivfs/morphine/zofran  for sx  Reevaluation of the patient after these medicines showed that the patient improved I have reviewed the patients home medicines and have made adjustments as needed   Test Considered:  ct   Critical Interventions:  Pain control   Problem List / ED Course:  Chronic pain:  pt is only taking tylenol #3.  I will increase that to hydrocodone as  the codeine is not helping.   AKI:  pt given IVFs.  Pt is encouraged to drink.  Daughter said she's not been drinking due to the pain. Abd mass:  size unchanged.  Pt's doctors are watching it.   Reevaluation:  After the interventions noted above, I reevaluated the patient and found that they have :improved   Social Determinants of Health:  Lives at home   Dispostion:  After consideration of the diagnostic results and the patients response to treatment, I feel that the patent would benefit from discharge with outpatient f/u.          Final Clinical Impression(s) / ED Diagnoses Final diagnoses:  Other chronic pain  AKI (acute kidney injury)  Dehydration    Rx / DC Orders ED Discharge Orders          Ordered    HYDROcodone-acetaminophen (NORCO/VICODIN) 5-325 MG tablet  Every 4 hours PRN        04/17/22 1611              Jacalyn Lefevre, MD 04/17/22 1614

## 2022-04-17 NOTE — ED Notes (Signed)
Patient transported to CT 

## 2022-04-25 ENCOUNTER — Encounter: Payer: Self-pay | Admitting: Nephrology

## 2022-05-01 ENCOUNTER — Ambulatory Visit: Payer: 59 | Admitting: "Endocrinology

## 2022-05-05 ENCOUNTER — Ambulatory Visit (INDEPENDENT_AMBULATORY_CARE_PROVIDER_SITE_OTHER): Payer: 59 | Admitting: Internal Medicine

## 2022-05-05 ENCOUNTER — Emergency Department (HOSPITAL_COMMUNITY): Payer: 59

## 2022-05-05 ENCOUNTER — Encounter (HOSPITAL_COMMUNITY): Payer: Self-pay | Admitting: Emergency Medicine

## 2022-05-05 ENCOUNTER — Inpatient Hospital Stay (HOSPITAL_COMMUNITY)
Admission: EM | Admit: 2022-05-05 | Discharge: 2022-05-10 | DRG: 690 | Disposition: A | Discharge status: Home / Self Care | Payer: 59 | Attending: Family Medicine | Admitting: Family Medicine

## 2022-05-05 ENCOUNTER — Encounter: Payer: Self-pay | Admitting: Internal Medicine

## 2022-05-05 ENCOUNTER — Other Ambulatory Visit: Payer: Self-pay

## 2022-05-05 VITALS — BP 101/70 | HR 125 | Temp 97.7°F | Ht 65.0 in

## 2022-05-05 DIAGNOSIS — A419 Sepsis, unspecified organism: Secondary | ICD-10-CM

## 2022-05-05 DIAGNOSIS — Z7982 Long term (current) use of aspirin: Secondary | ICD-10-CM | POA: Diagnosis not present

## 2022-05-05 DIAGNOSIS — R112 Nausea with vomiting, unspecified: Secondary | ICD-10-CM | POA: Diagnosis not present

## 2022-05-05 DIAGNOSIS — R1319 Other dysphagia: Secondary | ICD-10-CM | POA: Diagnosis present

## 2022-05-05 DIAGNOSIS — E1122 Type 2 diabetes mellitus with diabetic chronic kidney disease: Secondary | ICD-10-CM | POA: Diagnosis present

## 2022-05-05 DIAGNOSIS — I5032 Chronic diastolic (congestive) heart failure: Secondary | ICD-10-CM | POA: Diagnosis present

## 2022-05-05 DIAGNOSIS — E1165 Type 2 diabetes mellitus with hyperglycemia: Secondary | ICD-10-CM | POA: Diagnosis present

## 2022-05-05 DIAGNOSIS — N184 Chronic kidney disease, stage 4 (severe): Secondary | ICD-10-CM | POA: Diagnosis present

## 2022-05-05 DIAGNOSIS — N3000 Acute cystitis without hematuria: Secondary | ICD-10-CM

## 2022-05-05 DIAGNOSIS — E876 Hypokalemia: Secondary | ICD-10-CM | POA: Diagnosis not present

## 2022-05-05 DIAGNOSIS — Z79899 Other long term (current) drug therapy: Secondary | ICD-10-CM

## 2022-05-05 DIAGNOSIS — Z66 Do not resuscitate: Secondary | ICD-10-CM | POA: Diagnosis present

## 2022-05-05 DIAGNOSIS — E872 Acidosis, unspecified: Secondary | ICD-10-CM | POA: Diagnosis present

## 2022-05-05 DIAGNOSIS — G8929 Other chronic pain: Secondary | ICD-10-CM | POA: Diagnosis present

## 2022-05-05 DIAGNOSIS — I130 Hypertensive heart and chronic kidney disease with heart failure and stage 1 through stage 4 chronic kidney disease, or unspecified chronic kidney disease: Secondary | ICD-10-CM | POA: Diagnosis present

## 2022-05-05 DIAGNOSIS — B9620 Unspecified Escherichia coli [E. coli] as the cause of diseases classified elsewhere: Secondary | ICD-10-CM | POA: Diagnosis present

## 2022-05-05 DIAGNOSIS — E861 Hypovolemia: Secondary | ICD-10-CM | POA: Diagnosis not present

## 2022-05-05 DIAGNOSIS — Z8051 Family history of malignant neoplasm of kidney: Secondary | ICD-10-CM

## 2022-05-05 DIAGNOSIS — Z993 Dependence on wheelchair: Secondary | ICD-10-CM | POA: Diagnosis not present

## 2022-05-05 DIAGNOSIS — K449 Diaphragmatic hernia without obstruction or gangrene: Secondary | ICD-10-CM | POA: Diagnosis present

## 2022-05-05 DIAGNOSIS — F0280 Dementia in other diseases classified elsewhere without behavioral disturbance: Secondary | ICD-10-CM | POA: Diagnosis present

## 2022-05-05 DIAGNOSIS — R197 Diarrhea, unspecified: Secondary | ICD-10-CM | POA: Diagnosis present

## 2022-05-05 DIAGNOSIS — K222 Esophageal obstruction: Secondary | ICD-10-CM | POA: Diagnosis present

## 2022-05-05 DIAGNOSIS — N1832 Chronic kidney disease, stage 3b: Secondary | ICD-10-CM | POA: Diagnosis not present

## 2022-05-05 DIAGNOSIS — N390 Urinary tract infection, site not specified: Secondary | ICD-10-CM | POA: Diagnosis present

## 2022-05-05 DIAGNOSIS — R1314 Dysphagia, pharyngoesophageal phase: Secondary | ICD-10-CM | POA: Diagnosis present

## 2022-05-05 DIAGNOSIS — Z800 Family history of malignant neoplasm of digestive organs: Secondary | ICD-10-CM

## 2022-05-05 DIAGNOSIS — K219 Gastro-esophageal reflux disease without esophagitis: Secondary | ICD-10-CM | POA: Diagnosis present

## 2022-05-05 DIAGNOSIS — I950 Idiopathic hypotension: Secondary | ICD-10-CM | POA: Diagnosis not present

## 2022-05-05 DIAGNOSIS — Z8041 Family history of malignant neoplasm of ovary: Secondary | ICD-10-CM

## 2022-05-05 DIAGNOSIS — K259 Gastric ulcer, unspecified as acute or chronic, without hemorrhage or perforation: Secondary | ICD-10-CM | POA: Diagnosis present

## 2022-05-05 DIAGNOSIS — G20A1 Parkinson's disease without dyskinesia, without mention of fluctuations: Secondary | ICD-10-CM | POA: Diagnosis present

## 2022-05-05 DIAGNOSIS — I9589 Other hypotension: Secondary | ICD-10-CM

## 2022-05-05 DIAGNOSIS — F0390 Unspecified dementia without behavioral disturbance: Secondary | ICD-10-CM | POA: Diagnosis present

## 2022-05-05 DIAGNOSIS — I10 Essential (primary) hypertension: Secondary | ICD-10-CM | POA: Diagnosis not present

## 2022-05-05 DIAGNOSIS — E860 Dehydration: Secondary | ICD-10-CM | POA: Diagnosis present

## 2022-05-05 DIAGNOSIS — N12 Tubulo-interstitial nephritis, not specified as acute or chronic: Secondary | ICD-10-CM | POA: Diagnosis present

## 2022-05-05 DIAGNOSIS — N183 Chronic kidney disease, stage 3 unspecified: Secondary | ICD-10-CM | POA: Diagnosis not present

## 2022-05-05 DIAGNOSIS — M419 Scoliosis, unspecified: Secondary | ICD-10-CM | POA: Diagnosis present

## 2022-05-05 DIAGNOSIS — I959 Hypotension, unspecified: Secondary | ICD-10-CM | POA: Diagnosis present

## 2022-05-05 HISTORY — DX: Unspecified dementia, unspecified severity, without behavioral disturbance, psychotic disturbance, mood disturbance, and anxiety: F03.90

## 2022-05-05 LAB — COMPREHENSIVE METABOLIC PANEL
ALT: 14 U/L (ref 0–44)
AST: 25 U/L (ref 15–41)
Albumin: 1.7 g/dL — ABNORMAL LOW (ref 3.5–5.0)
Alkaline Phosphatase: 155 U/L — ABNORMAL HIGH (ref 38–126)
Anion gap: 11 (ref 5–15)
BUN: 30 mg/dL — ABNORMAL HIGH (ref 8–23)
CO2: 24 mmol/L (ref 22–32)
Calcium: 8.4 mg/dL — ABNORMAL LOW (ref 8.9–10.3)
Chloride: 94 mmol/L — ABNORMAL LOW (ref 98–111)
Creatinine, Ser: 2.17 mg/dL — ABNORMAL HIGH (ref 0.44–1.00)
GFR, Estimated: 22 mL/min — ABNORMAL LOW (ref >60)
Glucose, Bld: 254 mg/dL — ABNORMAL HIGH (ref 70–99)
Potassium: 4.3 mmol/L (ref 3.5–5.1)
Sodium: 129 mmol/L — ABNORMAL LOW (ref 135–145)
Total Bilirubin: 1 mg/dL (ref 0.3–1.2)
Total Protein: 6.4 g/dL — ABNORMAL LOW (ref 6.5–8.1)

## 2022-05-05 LAB — LACTIC ACID, PLASMA
Lactic Acid, Venous: 3.7 mmol/L (ref 0.5–1.9)
Lactic Acid, Venous: 3.8 mmol/L (ref 0.5–1.9)

## 2022-05-05 LAB — URINALYSIS, ROUTINE W REFLEX MICROSCOPIC
Bilirubin Urine: NEGATIVE
Glucose, UA: NEGATIVE mg/dL
Ketones, ur: NEGATIVE mg/dL
Nitrite: NEGATIVE
Protein, ur: 100 mg/dL — AB
Specific Gravity, Urine: 1.009 (ref 1.005–1.030)
WBC, UA: >50 WBC/hpf (ref 0–5)
pH: 5 (ref 5.0–8.0)

## 2022-05-05 LAB — CBC WITH DIFFERENTIAL/PLATELET
Abs Immature Granulocytes: 0.04 10*3/uL (ref 0.00–0.07)
Basophils Absolute: 0 10*3/uL (ref 0.0–0.1)
Basophils Relative: 0 %
Eosinophils Absolute: 0 10*3/uL (ref 0.0–0.5)
Eosinophils Relative: 0 %
HCT: 40.2 % (ref 36.0–46.0)
Hemoglobin: 13.7 g/dL (ref 12.0–15.0)
Immature Granulocytes: 1 %
Lymphocytes Relative: 12 %
Lymphs Abs: 1 10*3/uL (ref 0.7–4.0)
MCH: 28.1 pg (ref 26.0–34.0)
MCHC: 34.1 g/dL (ref 30.0–36.0)
MCV: 82.5 fL (ref 80.0–100.0)
Monocytes Absolute: 0.6 10*3/uL (ref 0.1–1.0)
Monocytes Relative: 7 %
Neutro Abs: 6.9 10*3/uL (ref 1.7–7.7)
Neutrophils Relative %: 80 %
Platelets: 288 10*3/uL (ref 150–400)
RBC: 4.87 MIL/uL (ref 3.87–5.11)
RDW: 18.5 % — ABNORMAL HIGH (ref 11.5–15.5)
WBC: 8.6 10*3/uL (ref 4.0–10.5)
nRBC: 0 % (ref 0.0–0.2)

## 2022-05-05 LAB — GLUCOSE, CAPILLARY: Glucose-Capillary: 222 mg/dL — ABNORMAL HIGH (ref 70–99)

## 2022-05-05 LAB — APTT: aPTT: 30 seconds (ref 24–36)

## 2022-05-05 LAB — PROTIME-INR
INR: 0.9 (ref 0.8–1.2)
Prothrombin Time: 11.7 seconds (ref 11.4–15.2)

## 2022-05-05 MED ORDER — ONDANSETRON HCL 4 MG/2ML IJ SOLN: 4.0000 mg | Freq: Four times a day (QID) | INTRAMUSCULAR | Status: DC | PRN

## 2022-05-05 MED ORDER — INSULIN GLARGINE 100 UNIT/ML ~~LOC~~ SOLN
10.0000 [IU] | Freq: Every day | SUBCUTANEOUS | Status: DC
  Filled 2022-05-05: qty 0.1

## 2022-05-05 MED ORDER — HYDROCODONE-ACETAMINOPHEN 5-325 MG PO TABS
1.0000 | ORAL_TABLET | ORAL | Status: DC | PRN
  Administered 2022-05-06 – 2022-05-09 (×6): 1 via ORAL
  Filled 2022-05-05 (×8): qty 1

## 2022-05-05 MED ORDER — SODIUM CHLORIDE 0.9 % IV BOLUS
500.0000 mL | Freq: Once | INTRAVENOUS | Status: AC
  Administered 2022-05-05: 500 mL via INTRAVENOUS

## 2022-05-05 MED ORDER — SODIUM CHLORIDE 0.9 % IV SOLN: INTRAVENOUS | Status: AC

## 2022-05-05 MED ORDER — INSULIN ASPART 100 UNIT/ML IJ SOLN
0.0000 [IU] | Freq: Three times a day (TID) | INTRAMUSCULAR | Status: DC
  Administered 2022-05-07 (×2): 2 [IU] via SUBCUTANEOUS
  Administered 2022-05-08: 3 [IU] via SUBCUTANEOUS
  Administered 2022-05-08 – 2022-05-09 (×3): 2 [IU] via SUBCUTANEOUS
  Administered 2022-05-10 (×2): 3 [IU] via SUBCUTANEOUS

## 2022-05-05 MED ORDER — SODIUM CHLORIDE 0.9 % IV SOLN
1.0000 g | Freq: Once | INTRAVENOUS | Status: AC
  Administered 2022-05-05: 1 g via INTRAVENOUS
  Filled 2022-05-05: qty 10

## 2022-05-05 MED ORDER — INSULIN ASPART 100 UNIT/ML IJ SOLN
0.0000 [IU] | Freq: Every day | INTRAMUSCULAR | Status: DC
  Administered 2022-05-05: 2 [IU] via SUBCUTANEOUS

## 2022-05-05 MED ORDER — ONDANSETRON HCL 4 MG PO TABS
4.0000 mg | ORAL_TABLET | Freq: Four times a day (QID) | ORAL | Status: DC | PRN
  Administered 2022-05-08: 4 mg via ORAL
  Filled 2022-05-05: qty 1

## 2022-05-05 MED ORDER — ENOXAPARIN SODIUM 30 MG/0.3ML IJ SOSY
30.0000 mg | PREFILLED_SYRINGE | INTRAMUSCULAR | Status: DC
  Administered 2022-05-05: 30 mg via SUBCUTANEOUS
  Filled 2022-05-05: qty 0.3

## 2022-05-05 MED ORDER — ACETAMINOPHEN 650 MG RE SUPP: 650.0000 mg | Freq: Four times a day (QID) | RECTAL | Status: DC | PRN

## 2022-05-05 MED ORDER — CALCITRIOL 0.25 MCG PO CAPS
0.2500 ug | ORAL_CAPSULE | ORAL | Status: DC
  Administered 2022-05-06 – 2022-05-10 (×3): 0.25 ug via ORAL
  Filled 2022-05-05 (×4): qty 1

## 2022-05-05 MED ORDER — ORAL CARE MOUTH RINSE: 15.0000 mL | OROMUCOSAL | Status: DC | PRN

## 2022-05-05 MED ORDER — ACETAMINOPHEN 325 MG PO TABS: 650.0000 mg | ORAL_TABLET | Freq: Four times a day (QID) | ORAL | Status: DC | PRN

## 2022-05-05 MED ORDER — SODIUM CHLORIDE 0.9 % IV SOLN
1.0000 g | INTRAVENOUS | Status: DC
  Administered 2022-05-06 – 2022-05-08 (×3): 1 g via INTRAVENOUS
  Filled 2022-05-05 (×3): qty 10

## 2022-05-05 MED ORDER — INSULIN GLARGINE-YFGN 100 UNIT/ML ~~LOC~~ SOLN
10.0000 [IU] | Freq: Every day | SUBCUTANEOUS | Status: DC
  Administered 2022-05-05: 10 [IU] via SUBCUTANEOUS
  Filled 2022-05-05 (×2): qty 0.1

## 2022-05-05 MED ORDER — ASPIRIN 81 MG PO TBEC
81.0000 mg | DELAYED_RELEASE_TABLET | Freq: Every day | ORAL | Status: DC
  Administered 2022-05-06 – 2022-05-10 (×4): 81 mg via ORAL
  Filled 2022-05-05 (×5): qty 1

## 2022-05-05 MED ORDER — INSULIN LISPRO PROT & LISPRO (50-50 MIX) 100 UNIT/ML KWIKPEN: 10.0000 [IU] | PEN_INJECTOR | Freq: Two times a day (BID) | SUBCUTANEOUS | Status: DC

## 2022-05-05 MED ORDER — SODIUM CHLORIDE 0.9 % IV BOLUS
1000.0000 mL | Freq: Once | INTRAVENOUS | Status: AC
  Administered 2022-05-05: 1000 mL via INTRAVENOUS

## 2022-05-05 NOTE — Assessment & Plan Note (Addendum)
Poor oral intake for the past month, with vomiting and diarrhea-2-4 times x 3 days.  Diarrhea appears to be resolving, last episode at about 1:30 AM earlier today, stool was more formed.  Hypotension with lactic acidosis likely more from dehydration rather than sepsis.  Lactic acidosis 3.7 > 3.8.  Has 1+ chronic bilateral pitting lower extremity edema unchanged from baseline. - 1.5 L bolus, continue N/s 75cc/hr x 20hrs -Trend lactic acid -Hold home carvedilol 3.125, and Lasix 20 mg daily

## 2022-05-05 NOTE — Assessment & Plan Note (Signed)
Creatinine 2.1.  About baseline.  CKD stage IIIb/IV. -Resume calcitriol

## 2022-05-05 NOTE — Assessment & Plan Note (Addendum)
Per Daughter- Mild.  At this time she is able to answer simple questions appropriately, follow directions.  Ambulates with a wheelchair.  On monthly risedronate

## 2022-05-05 NOTE — ED Notes (Signed)
In and out cath perform, pericare prior to starting, assisted by [place] NT. Pt tolerated well. Thick cloudy urine obtained. Urine and culture sent to lab.

## 2022-05-05 NOTE — Assessment & Plan Note (Signed)
Daughter reports swallowing problems. -Speech therapy evaluation

## 2022-05-05 NOTE — H&P (Signed)
History and Physical    [name]:[id number] DOB: [birth year] DOA: 05/05/2022  PCP: [name], MD   Patient coming from: Home  I have personally briefly reviewed patient's old medical records in [facility]  Chief Complaint: Hypotension, Vomiting, diarrhea  HPI: [name] is a 82 y.o. female with medical history significant for diabetes mellitus, hypertension, dementia, CKD 4.  Patient was sent to the ED from GI office with reports of hypotension and tachycardia. At the time of my evaluation, patient is awake alert oriented and able to answer questions, her daughter [name] is at bedside.  They report over the past month, patient has been getting progressively weak, poor oral intake, nausea and vomiting.  Over the past 4 days she has been having diarrhea 2-4 times daily.  No abdominal pain.  She reports chronic back pain secondary to scoliosis that is unchanged.  No fevers no chills.  No difficulty breathing no cough no chest pain.  ED Course: Temperature 97.6 heart rate 60s to 100.  Respirate rate 14- 18.  Blood pressure systolic down to 81/19 improved with IV fluids to 120s systolic. WBC 8.6.  Lactic acidosis 3.7 > 3.8.   Urine described as thick cloudy urine, with UA suggestive of UTI.  Chest x-ray shows trace left pleural effusion and left lung base atelectasis but pneumonia not excluded. 1 L bolus given. Urine cultures obtained. IV ceftriaxone given.  Hospitalist admit for UTI, hypotension.  Review of Systems: As per HPI all other systems reviewed and negative.  Past Medical History:  Diagnosis Date   CKD (chronic kidney disease)    Dementia (HCC)    Diabetes mellitus    Diastolic heart failure (HCC)    GERD (gastroesophageal reflux disease)    Hypertension    Parkinson disease    Scoliosis    Scoliosis    UTI (lower urinary tract infection)     Past Surgical History:  Procedure Laterality Date   BALLOON DILATION N/A 02/21/2021   Procedure: BALLOON  DILATION;  Surgeon: Lanelle Bal, DO;  [facility];  Service: Endoscopy;  Laterality: N/A;   BIOPSY  02/21/2021   Procedure: BIOPSY;  Surgeon: Lanelle Bal, DO;  Location: [facility];  Service: Endoscopy;;   boil Left    boil removed from left cheek   ESOPHAGOGASTRODUODENOSCOPY (EGD) WITH PROPOFOL N/A 02/21/2021   Procedure: ESOPHAGOGASTRODUODENOSCOPY (EGD) WITH PROPOFOL;  Surgeon: Lanelle Bal, DO;  Location: [facility];  Service: Endoscopy;  Laterality: N/A;  11:00am   EYE SURGERY     Retina Detachment   MASS EXCISION N/A 06/27/2014   Procedure: EXCISION SUBMANDIBULAR STONE;  Surgeon: Serena Colonel, MD;  Location: [facility] OR;  Service: ENT;  Laterality: N/A;   sublingual gland removal  2016   tumor removed from rib       reports that she has never smoked. She has never used smokeless tobacco. She reports that she does not drink alcohol and does not use drugs.  No Known Allergies  Family History  Problem Relation Age of Onset   Colon cancer Maternal Grandmother        6   Kidney cancer Daughter 74   Ovarian cancer Daughter 37    Prior to Admission medications   Medication Sig Start Date End Date Taking? Authorizing Provider  aspirin EC 81 MG tablet Take 81 mg by mouth daily.   Yes [provider]  calcitRIOL (ROCALTROL) 0.25 MCG capsule Take 0.25 mcg by  mouth every other day.   Yes [provider]  cholecalciferol (VITAMIN D3) 25 MCG (1000 UNIT) tablet Take 1,000 Units by mouth daily.   Yes [provider]  furosemide (LASIX) 20 MG tablet Take 20 mg by mouth 2 (two) times daily. Two tablets qam and 1 tablet qpm   Yes [provider]  HUMALOG MIX 50/50 KWIKPEN (50-50) 100 UNIT/ML Kwikpen Take 20-40 Units by mouth 2 (two) times daily before a meal. 40units in the morning and 20 units in the evening 06/01/14  Yes [provider]  HYDROcodone-acetaminophen (NORCO/VICODIN) 5-325 MG tablet Take 1 tablet by mouth every 4  (four) hours as needed. 04/17/22  Yes Jacalyn Lefevre, MD  loperamide (IMODIUM) 2 MG capsule Take 1 capsule (2 mg total) by mouth 3 (three) times daily as needed for diarrhea or loose stools. Patient taking differently: Take 2-4 mg by mouth 3 (three) times daily as needed for diarrhea or loose stools. 02/02/22  Yes Tiffany Kocher, PA-C  omeprazole (PRILOSEC) 20 MG capsule TAKE ONE (1) CAPSULE BY MOUTH TWICE DAILY BEFORE MEALS Patient taking differently: Take 20 mg by mouth 2 (two) times daily before a meal. 12/27/21  Yes Tiffany Kocher, PA-C  ondansetron (ZOFRAN) 4 MG tablet Take 1 tablet (4 mg total) by mouth every 8 (eight) hours as needed for nausea or vomiting. 02/02/22  Yes Tiffany Kocher, PA-C  Potassium Chloride ER 20 MEQ TBCR Take 1 tablet by mouth daily. 04/24/22  Yes [provider]  risedronate (ACTONEL) 150 MG tablet Take 150 mg by mouth every 30 (thirty) days. with water on empty stomach, nothing by mouth or lie down for next 30 minutes.   Yes [provider]  simvastatin (ZOCOR) 5 MG tablet Take 5 mg by mouth at bedtime. 06/01/14  Yes [provider]  carvedilol (COREG) 3.125 MG tablet Take 3.125 mg by mouth 2 (two) times daily. 09/03/21   [provider]  ketoconazole (NIZORAL) 2 % cream Apply 1 application topically daily. Patient not taking: Reported on 05/05/2022 02/05/21   Wallis Bamberg, [place]    Physical Exam: Vitals:   05/05/22 1522 05/05/22 1524 05/05/22 1545 05/05/22 1630  BP:  (!) 79/52 (!) 82/59 102/76  Pulse:  100 94 74  Resp:  17 18 17   Temp:  97.6 F (36.4 C)    TempSrc:  Oral    SpO2:  98% 100% 100%  Weight: 79 kg     Height: 5\' 5"  (1.651 m)       Constitutional: NAD, calm, comfortable Vitals:   05/05/22 1522 05/05/22 1524 05/05/22 1545 05/05/22 1630  BP:  (!) 79/52 (!) 82/59 102/76  Pulse:  100 94 74  Resp:  17 18 17   Temp:  97.6 F (36.4 C)    TempSrc:  Oral    SpO2:  98% 100% 100%  Weight: 79 kg     Height: 5\' 5"  (1.651  m)      Eyes: PERRL, lids and conjunctivae normal ENMT: Mucous membranes are moist.   Neck: normal, supple, no masses, no thyromegaly Respiratory: clear to auscultation bilaterally, no wheezing, no crackles. Normal respiratory effort. No accessory muscle use.  Cardiovascular: Regular rate and rhythm, no murmurs / rubs / gallops.  1+ pitting bilateral lower extremity edema, chronic, unchanged, wearing compression stockings.  Extremities warm Abdomen: no tenderness, no masses palpated. No hepatosplenomegaly. Bowel sounds positive.  Musculoskeletal: no clubbing / cyanosis. No joint deformity upper and lower extremities.  Skin: Several small  healing areas to patient's back that look like scabs that have fallen/peeled off, no signs of infection.  No rashes, lesions, ulcers. No induration.  No pressure ulcers identified Neurologic: ambulates with wheelchair, unable to lift lower extremities against gravity- her baseline, 4/5 strength of bilateral upper extremities, ok grip strength.   Psychiatric: Normal judgment and insight. Alert and oriented x 3. Normal mood.   Labs on Admission: I have personally reviewed following labs and imaging studies  CBC: Recent Labs  Lab 05/05/22 1648  WBC 8.6  NEUTROABS 6.9  HGB 13.7  HCT 40.2  MCV 82.5  PLT 288   Basic Metabolic Panel: Recent Labs  Lab 05/05/22 1648  NA 129*  K 4.3  CL 94*  CO2 24  GLUCOSE 254*  BUN 30*  CREATININE 2.17*  CALCIUM 8.4*   GFR: Estimated Creatinine Clearance: 21.1 mL/min (A) (by C-G formula based on SCr of 2.17 mg/dL (H)). Liver Function Tests: Recent Labs  Lab 05/05/22 1648  AST 25  ALT 14  ALKPHOS 155*  BILITOT 1.0  PROT 6.4*  ALBUMIN 1.7*   Coagulation Profile: Recent Labs  Lab 05/05/22 1648  INR 0.9   Urine analysis:    Component Value Date/Time   COLORURINE YELLOW 05/05/2022 1630   APPEARANCEUR TURBID (A) 05/05/2022 1630   APPEARANCEUR Hazy (A) 08/26/2021 1027   LABSPEC 1.009 05/05/2022 1630    PHURINE 5.0 05/05/2022 1630   GLUCOSEU NEGATIVE 05/05/2022 1630   HGBUR MODERATE (A) 05/05/2022 1630   BILIRUBINUR NEGATIVE 05/05/2022 1630   BILIRUBINUR Negative 08/26/2021 1027   KETONESUR NEGATIVE 05/05/2022 1630   PROTEINUR 100 (A) 05/05/2022 1630   UROBILINOGEN 1.0 10/12/2014 1935   NITRITE NEGATIVE 05/05/2022 1630   LEUKOCYTESUR LARGE (A) 05/05/2022 1630    Radiological Exams on Admission: DG Chest Port 1 View  Result Date: 05/05/2022 CLINICAL DATA:  Questionable sepsis. EXAM: PORTABLE CHEST 1 VIEW COMPARISON:  Chest radiograph dated 10/12/2014. CT abdomen pelvis dated 04/17/2022. FINDINGS: Trace left pleural effusion and left lung base atelectasis. Pneumonia is not excluded the right lung is clear. No pneumothorax. The cardiac silhouette is within normal limits. Atherosclerotic calcification of the aorta. Moderate size hiatal hernia. No acute osseous pathology. IMPRESSION: Trace left pleural effusion and left lung base atelectasis. Pneumonia is not excluded. Electronically Signed   By: Elgie Collard M.D.   On: 05/05/2022 16:10    EKG: Independently reviewed.  Sinus rhythm, rate 76, QTc 470.  Low voltage.  No significant change from prior.  Assessment/Plan Principal Problem:   Hypotension Active Problems:   UTI (urinary tract infection)   Diabetes mellitus with stage 3 chronic kidney disease (HCC)   Esophageal dysphagia   Dementia (HCC)   CKD (chronic kidney disease) stage 3, GFR 30-59 ml/min (HCC)  Assessment and Plan: * Hypotension Poor oral intake for the past month, with vomiting and diarrhea-2-4 times x 3 days.  Diarrhea appears to be resolving, last episode at about 1:30 AM earlier today, stool was more formed.  Hypotension with lactic acidosis likely more from dehydration rather than sepsis.  Lactic acidosis 3.7 > 3.8.  Has 1+ chronic bilateral pitting lower extremity edema unchanged from baseline. - 1.5 L bolus, continue N/s 75cc/hr x 20hrs -Trend lactic  acid -Hold home carvedilol 3.125, and Lasix 20 mg daily  UTI (urinary tract infection) UA suggestive of UTI.  Has lactic acidosis and hypotension.  But she is afebrile temperature of 97.6, WBC 8.6.  Heart rate is 6200.  Respiratory rate 14-18.  Initial  hypotension and lactic acidosis likely more from dehydration.  Chronic back pain from scoliosis unchanged. -Follow-up blood and urine cultures -IV ceftriaxone 1 g daily  CKD (chronic kidney disease) stage 3, GFR 30-59 ml/min (HCC) Creatinine 2.1.  About baseline.  CKD stage IIIb/IV. -Resume calcitriol  Dementia (HCC) Per Daughter- Mild.  At this time she is able to answer simple questions appropriately, follow directions.  Ambulates with a wheelchair.  On monthly risedronate  Esophageal dysphagia Daughter reports swallowing problems. -Speech therapy evaluation  Diabetes mellitus with stage 3 chronic kidney disease (HCC) With hyperglycemia blood sugar 254. - HgbA1c - SSI- M -Resume home humalog at reduced dose 10 units twice daily, with poor oral intake.   DVT prophylaxis: Lovenox Code Status: DNR-confirmed with patient and daughter [name] at bedside. Family Communication: Daughter [name] is patient's primary Management consultant.  Patient has no designated HCPOA. Disposition Plan: ~ 2 days Consults called: NONe Admission status: Inpt tele I certify that at the point of admission it is my clinical judgment that the patient will require inpatient hospital care spanning beyond 2 midnights from the point of admission due to high intensity of service, high risk for further deterioration and high frequency of surveillance required.    Author: Onnie Boer, MD 05/05/2022 9:26 PM  For on call review [email or web address].

## 2022-05-05 NOTE — ED Provider Notes (Signed)
[place] EMERGENCY DEPARTMENT AT [facility] Provider Note   CSN: [id number] Arrival date & time: 05/05/22  1517     History  Chief Complaint  Patient presents with   Weakness    [name] is a 82 y.o. female.   Weakness  This patient is an 82 year old female with a history of Parkinson's, diabetes, possibly having early onset dementia, she lives with family who take care of her.  There was a reported history of stage IV chronic kidney disease, they follow-up with Dr. Wolfgang Phoenix and evidently has recently had a worsening of renal function, decreased oral intake, was following up at gastroenterology today with Dr. Jena Gauss when she was noted to be hypotensive and dizzy when she stood up thus she was sent here.  Family member, her daughter presents here with her stating that she has had very little to eat or drink and has now had some increasing diarrhea that is started in the last 24 hours described as loose to watery and green.  She has also had some vomiting and very poor p.o. intake over the last couple of days.    Home Medications Prior to Admission medications   Medication Sig Start Date End Date Taking? Authorizing Provider  aspirin EC 81 MG tablet Take 81 mg by mouth daily.   Yes [provider]  calcitRIOL (ROCALTROL) 0.25 MCG capsule Take 0.25 mcg by mouth every other day.   Yes [provider]  cholecalciferol (VITAMIN D3) 25 MCG (1000 UNIT) tablet Take 1,000 Units by mouth daily.   Yes [provider]  furosemide (LASIX) 20 MG tablet Take 20 mg by mouth 2 (two) times daily. Two tablets qam and 1 tablet qpm   Yes [provider]  HUMALOG MIX 50/50 KWIKPEN (50-50) 100 UNIT/ML Kwikpen Take 20-40 Units by mouth 2 (two) times daily before a meal. 40units in the morning and 20 units in the evening 06/01/14  Yes [provider]  HYDROcodone-acetaminophen (NORCO/VICODIN) 5-325 MG tablet Take 1 tablet by mouth every 4 (four) hours  as needed. 04/17/22  Yes Jacalyn Lefevre, MD  loperamide (IMODIUM) 2 MG capsule Take 1 capsule (2 mg total) by mouth 3 (three) times daily as needed for diarrhea or loose stools. Patient taking differently: Take 2-4 mg by mouth 3 (three) times daily as needed for diarrhea or loose stools. 02/02/22  Yes Tiffany Kocher, PA-C  omeprazole (PRILOSEC) 20 MG capsule TAKE ONE (1) CAPSULE BY MOUTH TWICE DAILY BEFORE MEALS Patient taking differently: Take 20 mg by mouth 2 (two) times daily before a meal. 12/27/21  Yes Tiffany Kocher, PA-C  ondansetron (ZOFRAN) 4 MG tablet Take 1 tablet (4 mg total) by mouth every 8 (eight) hours as needed for nausea or vomiting. 02/02/22  Yes Tiffany Kocher, PA-C  Potassium Chloride ER 20 MEQ TBCR Take 1 tablet by mouth daily. 04/24/22  Yes [provider]  risedronate (ACTONEL) 150 MG tablet Take 150 mg by mouth every 30 (thirty) days. with water on empty stomach, nothing by mouth or lie down for next 30 minutes.   Yes [provider]  simvastatin (ZOCOR) 5 MG tablet Take 5 mg by mouth at bedtime. 06/01/14  Yes [provider]  carvedilol (COREG) 3.125 MG tablet Take 3.125 mg by mouth 2 (two) times daily. 09/03/21   [provider]  ketoconazole (NIZORAL) 2 % cream Apply 1 application topically daily. Patient not taking: Reported on 05/05/2022 02/05/21   Wallis Bamberg, PA-C  Allergies    Patient has no known allergies.    Review of Systems   Review of Systems  Neurological:  Positive for weakness.  All other systems reviewed and are negative.   Physical Exam Updated Vital Signs BP 102/76   Pulse 74   Temp 97.6 F (36.4 C) (Oral)   Resp 17   Ht 1.651 m (5\' 5" )   Wt 79 kg   SpO2 100%   BMI 28.98 kg/m  Physical Exam Vitals and nursing note reviewed.  Constitutional:      General: She is not in acute distress.    Appearance: She is well-developed.  HENT:     Head: Normocephalic and atraumatic.     Mouth/Throat:      Pharynx: No oropharyngeal exudate.  Eyes:     General: No scleral icterus.       Right eye: No discharge.        Left eye: No discharge.     Conjunctiva/sclera: Conjunctivae normal.     Pupils: Pupils are equal, round, and reactive to light.  Neck:     Thyroid: No thyromegaly.     Vascular: No JVD.  Cardiovascular:     Rate and Rhythm: Normal rate and regular rhythm.     Heart sounds: Normal heart sounds. No murmur heard.    No friction rub. No gallop.  Pulmonary:     Effort: Pulmonary effort is normal. No respiratory distress.     Breath sounds: Normal breath sounds. No wheezing or rales.  Abdominal:     General: Bowel sounds are normal. There is no distension.     Palpations: Abdomen is soft. There is no mass.     Tenderness: There is no abdominal tenderness.  Musculoskeletal:        General: No tenderness. Normal range of motion.     Cervical back: Normal range of motion and neck supple.     Right lower leg: Edema present.     Left lower leg: Edema present.  Lymphadenopathy:     Cervical: No cervical adenopathy.  Skin:    General: Skin is warm and dry.     Findings: No erythema or rash.  Neurological:     Mental Status: She is alert.     Coordination: Coordination normal.     Comments: Disconjugate gaze  Psychiatric:        Behavior: Behavior normal.     ED Results / Procedures / Treatments   Labs (all labs ordered are listed, but only abnormal results are displayed) Labs Reviewed  LACTIC ACID, PLASMA - Abnormal; Notable for the following components:      Result Value   Lactic Acid, Venous 3.7 (*)    All other components within normal limits  LACTIC ACID, PLASMA - Abnormal; Notable for the following components:   Lactic Acid, Venous 3.8 (*)    All other components within normal limits  COMPREHENSIVE METABOLIC PANEL - Abnormal; Notable for the following components:   Sodium 129 (*)    Chloride 94 (*)    Glucose, Bld 254 (*)    BUN 30 (*)    Creatinine, Ser  2.17 (*)    Calcium 8.4 (*)    Total Protein 6.4 (*)    Albumin 1.7 (*)    Alkaline Phosphatase 155 (*)    GFR, Estimated 22 (*)    All other components within normal limits  CBC WITH DIFFERENTIAL/PLATELET - Abnormal; Notable for the following components:   RDW 18.5 (*)  All other components within normal limits  URINALYSIS, ROUTINE W REFLEX MICROSCOPIC - Abnormal; Notable for the following components:   APPearance TURBID (*)    Hgb urine dipstick MODERATE (*)    Protein, ur 100 (*)    Leukocytes,Ua LARGE (*)    Bacteria, UA MANY (*)    All other components within normal limits  CULTURE, BLOOD (ROUTINE X 2)  CULTURE, BLOOD (ROUTINE X 2)  URINE CULTURE  PROTIME-INR  APTT    EKG EKG Interpretation  Date/Time:  Tuesday May 05 2022 16:24:51 EDT Ventricular Rate:  76 PR Interval:  160 QRS Duration: 94 QT Interval:  419 QTC Calculation: 472 R Axis:   35 Text Interpretation: Sinus rhythm Low voltage, precordial leads Confirmed by Eber Hong ([id number]) on 05/05/2022 4:37:26 PM  Radiology DG Chest Port 1 View  Result Date: 05/05/2022 CLINICAL DATA:  Questionable sepsis. EXAM: PORTABLE CHEST 1 VIEW COMPARISON:  Chest radiograph dated 10/12/2014. CT abdomen pelvis dated 04/17/2022. FINDINGS: Trace left pleural effusion and left lung base atelectasis. Pneumonia is not excluded the right lung is clear. No pneumothorax. The cardiac silhouette is within normal limits. Atherosclerotic calcification of the aorta. Moderate size hiatal hernia. No acute osseous pathology. IMPRESSION: Trace left pleural effusion and left lung base atelectasis. Pneumonia is not excluded. Electronically Signed   By: Elgie Collard M.D.   On: 05/05/2022 16:10    Procedures .Critical Care  Performed by: Eber Hong, MD Authorized by: Eber Hong, MD   Critical care provider statement:    Critical care time (minutes):  45   Critical care time was exclusive of:  Separately billable procedures and  treating other patients   Critical care was necessary to treat or prevent imminent or life-threatening deterioration of the following conditions:  Sepsis   Critical care was time spent personally by me on the following activities:  Development of treatment plan with patient or surrogate, discussions with consultants, evaluation of patient's response to treatment, examination of patient, obtaining history from patient or surrogate, review of old charts, re-evaluation of patient's condition, pulse oximetry, ordering and review of radiographic studies, ordering and review of laboratory studies and ordering and performing treatments and interventions   I assumed direction of critical care for this patient from another provider in my specialty: no     Care discussed with: admitting provider   Comments:           Medications Ordered in ED Medications  sodium chloride 0.9 % bolus 1,000 mL (0 mLs Intravenous Stopped 05/05/22 1744)  cefTRIAXone (ROCEPHIN) 1 g in sodium chloride 0.9 % 100 mL IVPB (0 g Intravenous Stopped 05/05/22 1744)    ED Course/ Medical Decision Making/ A&P                             Medical Decision Making Amount and/or Complexity of Data Reviewed Labs: ordered. Radiology: ordered. ECG/medicine tests: ordered.  Risk Decision regarding hospitalization.    This patient presents to the ED for concern of weakness and hypotension, this involves an extensive number of treatment options, and is a complaint that carries with it a high risk of complications and morbidity.  The differential diagnosis includes volume depletion, worsening renal function, congestive heart failure, renal failure, infection, severe dehydration Blood pressure is low at 79/52 raising concern for a shock state   Co morbidities that complicate the patient evaluation  Parkinson's, possibly having some dementia, chronic renal failure   Additional  history obtained:  Additional history obtained from  family member and electronic medical record External records from outside source obtained and reviewed including recently seen in the ED for chronic pain and a mild acute kidney injury, recently seen for diarrhea back in February, followed by family doctors general surgeons and nephrology,   Lab Tests:  I Ordered, and personally interpreted labs.  The pertinent results include: CBC shows no leukocytosis, no anemia, metabolic panel shows a virtually absent albumin at 1.7, creatinine seems to be close to baseline as 2 weeks ago it was 2.3 and today it is 2.17.  Mildly hyponatremic, urinalysis shows many white blood cells and many bacteria consistent with pyelonephritis or UTI.  Lactic acid of 3.7 also consistent with sepsis or severe sepsis.   Imaging Studies ordered:  I ordered imaging studies including chest x-ray I independently visualized and interpreted imaging which showed no acute findings I agree with the radiologist interpretation   Cardiac Monitoring: / EKG:  The patient was maintained on a cardiac monitor.  I personally viewed and interpreted the cardiac monitored which showed an underlying rhythm of: Normal sinus rhythm   Consultations Obtained:  I requested consultation with the hospitalist,  and discussed lab and imaging findings as well as pertinent plan - they recommend: Admit to the hospital   Problem List / ED Course / Critical interventions / Medication management  Patient presents with severe sepsis and shock likely from urinary source given labs, patient will be admitted to the hospital.  On my exam the patient does have a lactate that is elevated a blood pressure that was very low and required lots of IV fluids for resuscitation.  She will need to be admitted to higher level of care but is improving and blood pressure is now over 100 systolic. I ordered medication including antibiotics and IV fluids for sepsis Reevaluation of the patient after these medicines showed  that the patient improved I have reviewed the patients home medicines and have made adjustments as needed   Social Determinants of Health:  Elderly   Test / Admission - Considered:  Will admit to the hospital         Final Clinical Impression(s) / ED Diagnoses Final diagnoses:  Pyelonephritis  Sepsis, due to unspecified organism, unspecified whether acute organ dysfunction present [facility])    Rx / DC Orders ED Discharge Orders     None         Eber Hong, MD 05/05/22 1825

## 2022-05-05 NOTE — ED Triage Notes (Signed)
Pt c/o increased weakness over the last couple of months. Pt sent over by GI due to hypotension. Ems states cbg was 323.

## 2022-05-05 NOTE — Assessment & Plan Note (Addendum)
UA suggestive of UTI.  Has lactic acidosis and hypotension.  But she is afebrile temperature of 97.6, WBC 8.6.  Heart rate is 6200.  Respiratory rate 14-18.  Initial hypotension and lactic acidosis likely more from dehydration.  Chronic back pain from scoliosis unchanged. -Follow-up blood and urine cultures -IV ceftriaxone 1 g daily

## 2022-05-05 NOTE — Progress Notes (Signed)
Primary Care Physician:  [name], MD Primary Gastroenterologist:  Dr. Marletta Lor  Pre-Procedure History & Physical: HPI:  [name] is a 82 y.o. female here for urgent evaluation.  This is a complicated lady presents with her daughter today to further evaluate a 2-week history of progressive nausea vomiting diminished oral intake diminished urine and stool output.  On presentation here she is hypotensive and tachycardic.  Not febrile.  States she "hurts all over and wants to be in the hospital"  At baseline, she is nonambulatory and gets around in a wheelchair. Nausea and vomiting over the past several months; worse recently. CT and MRI of the abdomen abdomen demonstrates multiple abnormalities include including a dilated intra and and extrahepatic biliary tree pancreatic atrophy lobulated right renal lesion, left ovarian cystic lesion.  Right renal lesion on CT felt to be benign on MRI.  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  Recent GYN, urology evaluation;  She has a large hiatal hernia history of pancreatic atrophy biliary dilation (intrahepatic and extrahepatic), retroperitoneal cyst; seen by Dr. 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  He recommended no invasive evaluation.  Family called EMS over the weekend for diminished oral intake lethargy.  EMS saw patient at her home.  They report that she was given IV fluids but was not transported.  IV fluids transiently made her feel better.  Past Medical History:  Diagnosis Date   CKD (chronic kidney disease)    Diabetes mellitus    Diastolic heart failure (HCC)    GERD (gastroesophageal reflux disease)    Hypertension    Parkinson disease    Scoliosis    Scoliosis    UTI (lower urinary tract infection)     Past Surgical History:  Procedure Laterality Date   BALLOON DILATION N/A 02/21/2021   Procedure: BALLOON DILATION;  Surgeon: Lanelle Bal, DO;  [facility];  Service: Endoscopy;  Laterality: N/A;   BIOPSY  02/21/2021   Procedure: BIOPSY;  Surgeon: Lanelle Bal, DO;  [facility];  Service: Endoscopy;;   boil Left    boil removed from left cheek   ESOPHAGOGASTRODUODENOSCOPY (EGD) WITH PROPOFOL N/A 02/21/2021   Procedure: ESOPHAGOGASTRODUODENOSCOPY (EGD) WITH PROPOFOL;  Surgeon: Lanelle Bal, DO;  [facility];  Service: Endoscopy;  Laterality: N/A;  11:00am   EYE SURGERY     Retina Detachment   MASS EXCISION N/A 06/27/2014   Procedure: EXCISION SUBMANDIBULAR STONE;  Surgeon: Serena Colonel, MD;  Location: [facility] OR;  Service: ENT;  Laterality: N/A;   sublingual gland removal  2016   tumor removed from rib      Prior to Admission medications   Medication Sig Start Date End Date Taking? Authorizing Provider  aspirin EC 81 MG tablet Take 81 mg by mouth daily.   Yes [provider]  calcitRIOL (ROCALTROL) 0.25 MCG capsule Take 0.25 mcg by mouth every other day.   Yes [provider]  furosemide (LASIX) 20 MG tablet Take 20 mg by mouth 2 (two) times daily. Two tablets qam and 1 tablet qpm   Yes [provider]  HUMALOG MIX 50/50 KWIKPEN (50-50) 100 UNIT/ML Kwikpen Take 20-40 Units by mouth 2 (two) times daily before a meal. 40units in the morning and 20 units in the evening 06/01/14  Yes [provider]  HYDROcodone-acetaminophen (NORCO/VICODIN) 5-325 MG tablet Take 1 tablet by mouth every 4 (four) hours as needed. 04/17/22  Yes Jacalyn Lefevre, MD  ketoconazole (NIZORAL) 2 % cream Apply 1 application topically daily. 02/05/21  Yes Wallis Bamberg, PA-C  loperamide (IMODIUM) 2 MG capsule Take 1 capsule (2 mg total) by mouth 3 (three) times daily as needed for diarrhea or loose stools. 02/02/22  Yes Tiffany Kocher, PA-C  omeprazole (PRILOSEC) 20 MG capsule TAKE ONE (1) CAPSULE BY MOUTH TWICE DAILY BEFORE MEALS 12/27/21  Yes Tiffany Kocher, PA-C  ondansetron (ZOFRAN) 4 MG tablet Take 1  tablet (4 mg total) by mouth every 8 (eight) hours as needed for nausea or vomiting. 02/02/22  Yes Tiffany Kocher, PA-C  potassium chloride SA (K-DUR,KLOR-CON) 20 MEQ tablet Take 20 mEq by mouth daily.   Yes [provider]  risedronate (ACTONEL) 150 MG tablet Take 150 mg by mouth every 30 (thirty) days. with water on empty stomach, nothing by mouth or lie down for next 30 minutes.   Yes [provider]  simvastatin (ZOCOR) 5 MG tablet Take 5 mg by mouth at bedtime. 06/01/14  Yes [provider]  carvedilol (COREG) 3.125 MG tablet Take 3.125 mg by mouth 2 (two) times daily. Patient not taking: Reported on 05/05/2022 09/03/21   [provider]  telmisartan (MICARDIS) 20 MG tablet Take 20 mg by mouth daily. Patient not taking: Reported on 05/05/2022 09/03/21   [provider]    Allergies as of 05/05/2022   (No Known Allergies)    Family History  Problem Relation Age of Onset   Colon cancer Maternal Grandmother        39   Kidney cancer Daughter 75   Ovarian cancer Daughter 42    Social History   Socioeconomic History   Marital status: Divorced    Spouse name: Not on file   Number of children: Not on file   Years of education: Not on file   Highest education level: Not on file  Occupational History   Not on file  Tobacco Use   Smoking status: Never   Smokeless tobacco: Never  Vaping Use   Vaping Use: Never used  Substance and Sexual Activity   Alcohol use: No    Alcohol/week: 0.0 standard drinks of alcohol   Drug use: No   Sexual activity: Not Currently    Birth control/protection: Post-menopausal, None  Other Topics Concern   Not on file  Social History Narrative   Not on file   Social Determinants of Health   Financial Resource Strain: Low Risk  (02/10/2022)   Overall Financial Resource Strain (CARDIA)    Difficulty of Paying Living Expenses: Not hard at all  Food Insecurity: No Food Insecurity (02/10/2022)   Hunger Vital Sign     Worried About Running Out of Food in the Last Year: Never true    Ran Out of Food in the Last Year: Never true  Transportation Needs: No Transportation Needs (02/10/2022)   PRAPARE - Administrator, Civil Service (Medical): No    Lack of Transportation (Non-Medical): No  Physical Activity: Inactive (02/10/2022)   Exercise Vital Sign    Days of Exercise per Week: 0 days    Minutes of Exercise per Session: 0 min  Stress: No Stress Concern Present (02/10/2022)   [employer] of Occupational Health - Occupational Stress Questionnaire    Feeling of Stress : Not at all  Social Connections: Socially Isolated (02/10/2022)   Social Connection and Isolation Panel [NHANES]    Frequency of Communication with Friends and Family: Three  times a week    Frequency of Social Gatherings with Friends and Family: Twice a week    Attends Religious Services: Never    Database administrator or Organizations: No    Attends Banker Meetings: Never    Marital Status: Divorced  Catering manager Violence: Not At Risk (02/10/2022)   Humiliation, Afraid, Rape, and Kick questionnaire    Fear of Current or Ex-Partner: No    Emotionally Abused: No    Physically Abused: No    Sexually Abused: No    Review of Systems: See HPI, otherwise negative ROS  Physical Exam: BP 101/70 (BP Location: Right Arm, Patient Position: Sitting, Cuff Size: Normal)   Pulse (!) 125   Temp 97.7 F (36.5 C) (Temporal)   Ht 5\' 5"  (1.651 m)   SpO2 97%   BMI 28.79 kg/m  General:   This lady presents in a wheelchair with her daughter and caregiver.  She looks ill. Relatively hypotensive and tachycardic. No scleral icterus. Neck:  Supple; no masses or thyromegaly. No significant cervical adenopathy. Lungs:  Clear throughout to auscultation.   No wheezes, crackles, or rhonchi. No acute distress. Heart:  Regular rate and rhythm; no murmurs, clicks, rubs,  or gallops. Abdomen: Exam limited.  Patient in wheelchair.   The abdomen is soft with no focal area tenderness.  No obvious mass.  Impression/Plan: 82 year old lady with a relatively complex medical history presents with a 2-week history of nausea vomiting.  Relatively hypotensive and tachycardic. She may be profoundly hypovolemic secondary to nausea vomiting/decreased oral intake.  Could be septic. She appears ill.  She needs urgent ED evaluation.  I have explained this assessment and recommendation with the patient and her daughter.  They are agreeable to urgent further evaluation  Recommendations:  To [facility] via EMS for further urgent evaluation.       Notice: This dictation was prepared with Dragon dictation along with smaller phrase technology. Any transcriptional errors that result from this process are unintentional and may not be corrected upon review.

## 2022-05-05 NOTE — Assessment & Plan Note (Signed)
With hyperglycemia blood sugar 254. - HgbA1c - SSI- M -Resume home humalog at reduced dose 10 units twice daily, with poor oral intake.

## 2022-05-06 DIAGNOSIS — I950 Idiopathic hypotension: Secondary | ICD-10-CM

## 2022-05-06 DIAGNOSIS — R1319 Other dysphagia: Secondary | ICD-10-CM | POA: Diagnosis not present

## 2022-05-06 DIAGNOSIS — N3000 Acute cystitis without hematuria: Secondary | ICD-10-CM | POA: Diagnosis not present

## 2022-05-06 LAB — BASIC METABOLIC PANEL
Anion gap: 8 (ref 5–15)
BUN: 27 mg/dL — ABNORMAL HIGH (ref 8–23)
CO2: 25 mmol/L (ref 22–32)
Calcium: 8 mg/dL — ABNORMAL LOW (ref 8.9–10.3)
Chloride: 101 mmol/L (ref 98–111)
Creatinine, Ser: 1.96 mg/dL — ABNORMAL HIGH (ref 0.44–1.00)
GFR, Estimated: 25 mL/min — ABNORMAL LOW (ref >60)
Glucose, Bld: 102 mg/dL — ABNORMAL HIGH (ref 70–99)
Potassium: 3.5 mmol/L (ref 3.5–5.1)
Sodium: 134 mmol/L — ABNORMAL LOW (ref 135–145)

## 2022-05-06 LAB — GLUCOSE, CAPILLARY
Glucose-Capillary: 104 mg/dL — ABNORMAL HIGH (ref 70–99)
Glucose-Capillary: 102 mg/dL — ABNORMAL HIGH (ref 70–99)
Glucose-Capillary: 105 mg/dL — ABNORMAL HIGH (ref 70–99)
Glucose-Capillary: 157 mg/dL — ABNORMAL HIGH (ref 70–99)

## 2022-05-06 LAB — CULTURE, BLOOD (ROUTINE X 2): Culture: NO GROWTH

## 2022-05-06 LAB — HEMOGLOBIN A1C
Hgb A1c MFr Bld: 8.3 % — ABNORMAL HIGH (ref 4.8–5.6)
Mean Plasma Glucose: 191.51 mg/dL

## 2022-05-06 LAB — CBC
HCT: 32.4 % — ABNORMAL LOW (ref 36.0–46.0)
Hemoglobin: 11.6 g/dL — ABNORMAL LOW (ref 12.0–15.0)
MCH: 28.3 pg (ref 26.0–34.0)
MCHC: 35.8 g/dL (ref 30.0–36.0)
MCV: 79 fL — ABNORMAL LOW (ref 80.0–100.0)
Platelets: 263 10*3/uL (ref 150–400)
RBC: 4.1 MIL/uL (ref 3.87–5.11)
RDW: 17 % — ABNORMAL HIGH (ref 11.5–15.5)
WBC: 7.7 10*3/uL (ref 4.0–10.5)
nRBC: 0 % (ref 0.0–0.2)

## 2022-05-06 LAB — LACTIC ACID, PLASMA: Lactic Acid, Venous: 3.8 mmol/L (ref 0.5–1.9)

## 2022-05-06 NOTE — Consult Note (Signed)
 Gastroenterology Consult   Referring Provider: No ref. provider found Primary Care Physician:  Hill, Gerald, MD Primary Gastroenterologist:  Dr.Rourk   Patient ID: [name]; [id number]; 10/30/1940   Admit date: 05/05/2022  LOS: 1 day   Date of Consultation: 05/06/2022  Reason for Consultation:  dysphagia/vomiting   History of Present Illness   [name] is a 81 y.o. 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 Rourk for nausea/ vomitng and dysphagia, noted to be hypotensive aan dizzy in the clinic, sent to ED for further evaluation. Admitted for IV rehydration and with SLP eval today with presentation consistent more with esophageal dysphagia with regurgitation shortly after consumption of liquids.   Patient has had nausea vomiting an decreased PO intake for the past 2 weeks. Daughter notes issues with liquids, pills and foods. Spitting substances up right after swallowing. Prior to acute onset she was doing well, eating well without issues. Denies c/o abdominal pain, no nausea. She takes omeprazole 20mg BID at home. Daughter denies c/o heartburn but patient belches a lot. Only new meds are imodium and zofran. She has not been on any recent steroids or antibiotics.  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    Past Medical History:  Diagnosis Date   CKD (chronic kidney disease)    Dementia (HCC)    Diabetes mellitus    Diastolic heart failure (HCC)    GERD (gastroesophageal reflux disease)    Hypertension    Parkinson disease    Scoliosis    Scoliosis    UTI (lower urinary tract infection)     Past Surgical History:  Procedure Laterality Date   BALLOON DILATION N/A 02/21/2021   Procedure: BALLOON DILATION;  Surgeon: Carver, Charles K, DO;  [facility];  Service: Endoscopy;  Laterality: N/A;   BIOPSY  02/21/2021   Procedure:  BIOPSY;  Surgeon: Carver, Charles K, DO;  [facility];  Service: Endoscopy;;   boil Left    boil removed from left cheek   ESOPHAGOGASTRODUODENOSCOPY (EGD) WITH PROPOFOL N/A 02/21/2021   Procedure: ESOPHAGOGASTRODUODENOSCOPY (EGD) WITH PROPOFOL;  Surgeon: Carver, Charles K, DO;  [facility];  Service: Endoscopy;  Laterality: N/A;  11:00am   EYE SURGERY     Retina Detachment   MASS EXCISION N/A 06/27/2014   Procedure: EXCISION SUBMANDIBULAR STONE;  Surgeon: Jefry Rosen, MD;  Location: [facility];  Service: ENT;  Laterality: N/A;   sublingual gland removal  2016   tumor removed from rib      Prior to Admission medications   Medication Sig Start Date End Date Taking? Authorizing Provider  aspirin EC 81 MG tablet Take 81 mg by mouth daily.   Yes [provider]  calcitRIOL (ROCALTROL) 0.25 MCG capsule Take 0.25 mcg by mouth every other day.   Yes [provider]  cholecalciferol (VITAMIN D3) 25 MCG (1000 UNIT) tablet Take 1,000 Units by mouth daily.   Yes [provider]  furosemide (LASIX) 20 MG tablet Take 20 mg by mouth 2 (two) times daily. Two tablets qam and 1 tablet qpm   Yes [provider]  HUMALOG MIX 50/50 KWIKPEN (50-50) 100 UNIT/ML Kwikpen Take 20-40 Units by mouth 2 (two) times daily before a meal. 40units in the morning and 20 units in the evening 06/01/14  Yes [provider]  HYDROcodone-acetaminophen (NORCO/VICODIN) 5-325 MG   tablet Take 1 tablet by mouth every 4 (four) hours as needed. 04/17/22  Yes Haviland, Julie, MD  loperamide (IMODIUM) 2 MG capsule Take 1 capsule (2 mg total) by mouth 3 (three) times daily as needed for diarrhea or loose stools. Patient taking differently: Take 2-4 mg by mouth 3 (three) times daily as needed for diarrhea or loose stools. 02/02/22  Yes Lewis, Leslie S, PA-C  omeprazole (PRILOSEC) 20 MG capsule TAKE ONE (1) CAPSULE BY MOUTH TWICE DAILY BEFORE MEALS Patient taking differently: Take 20  mg by mouth 2 (two) times daily before a meal. 12/27/21  Yes Lewis, Leslie S, PA-C  ondansetron (ZOFRAN) 4 MG tablet Take 1 tablet (4 mg total) by mouth every 8 (eight) hours as needed for nausea or vomiting. 02/02/22  Yes Lewis, Leslie S, PA-C  Potassium Chloride ER 20 MEQ TBCR Take 1 tablet by mouth daily. 04/24/22  Yes [provider]  risedronate (ACTONEL) 150 MG tablet Take 150 mg by mouth every 30 (thirty) days. with water on empty stomach, nothing by mouth or lie down for next 30 minutes.   Yes [provider]  simvastatin (ZOCOR) 5 MG tablet Take 5 mg by mouth at bedtime. 06/01/14  Yes [provider]  carvedilol (COREG) 3.125 MG tablet Take 3.125 mg by mouth 2 (two) times daily. 09/03/21   [provider]  ketoconazole (NIZORAL) 2 % cream Apply 1 application topically daily. Patient not taking: Reported on 05/05/2022 02/05/21   Mani, Mario, PA-C    Current Facility-Administered Medications  Medication Dose Route Frequency Provider Last Rate Last Admin   0.9 %  sodium chloride infusion   Intravenous Continuous Emokpae, Ejiroghene E, MD 75 mL/hr at 05/06/22 0538 New Bag at 05/06/22 0538   acetaminophen (TYLENOL) tablet 650 mg  650 mg Oral Q6H PRN Emokpae, Ejiroghene E, MD       Or   acetaminophen (TYLENOL) suppository 650 mg  650 mg Rectal Q6H PRN Emokpae, Ejiroghene E, MD       aspirin EC tablet 81 mg  81 mg Oral Daily Emokpae, Ejiroghene E, MD   81 mg at 05/06/22 0851   calcitRIOL (ROCALTROL) capsule 0.25 mcg  0.25 mcg Oral QODAY Emokpae, Ejiroghene E, MD   0.25 mcg at 05/06/22 0851   cefTRIAXone (ROCEPHIN) 1 g in sodium chloride 0.9 % 100 mL IVPB  1 g Intravenous Q24H Emokpae, Ejiroghene E, MD       enoxaparin (LOVENOX) injection 30 mg  30 mg Subcutaneous Q24H Emokpae, Ejiroghene E, MD   30 mg at 05/05/22 2145   HYDROcodone-acetaminophen (NORCO/VICODIN) 5-325 MG per tablet 1 tablet  1 tablet Oral Q4H PRN Emokpae, Ejiroghene E, MD   1 tablet at 05/06/22 0851    insulin aspart (novoLOG) injection 0-15 Units  0-15 Units Subcutaneous TID WC Emokpae, Ejiroghene E, MD       insulin aspart (novoLOG) injection 0-5 Units  0-5 Units Subcutaneous QHS Emokpae, Ejiroghene E, MD   2 Units at 05/05/22 2146   insulin glargine-yfgn (SEMGLEE) injection 10 Units  10 Units Subcutaneous QHS Emokpae, Ejiroghene E, MD   10 Units at 05/05/22 2219   ondansetron (ZOFRAN) tablet 4 mg  4 mg Oral Q6H PRN Emokpae, Ejiroghene E, MD       Or   ondansetron (ZOFRAN) injection 4 mg  4 mg Intravenous Q6H PRN Emokpae, Ejiroghene E, MD       Oral care mouth rinse  15 mL Mouth Rinse PRN Emokpae, Ejiroghene E, MD          Allergies as of 05/05/2022   (No Known Allergies)    Family History  Problem Relation Age of Onset   Colon cancer Maternal Grandmother        68   Kidney cancer Daughter 51   Ovarian cancer Daughter 46    Social History   Socioeconomic History   Marital status: Divorced    Spouse name: Not on file   Number of children: Not on file   Years of education: Not on file   Highest education level: Not on file  Occupational History   Not on file  Tobacco Use   Smoking status: Never   Smokeless tobacco: Never  Vaping Use   Vaping Use: Never used  Substance and Sexual Activity   Alcohol use: No    Alcohol/week: 0.0 standard drinks of alcohol   Drug use: No   Sexual activity: Not Currently    Birth control/protection: Post-menopausal, None  Other Topics Concern   Not on file  Social History Narrative   Not on file   Social Determinants of Health   Financial Resource Strain: Low Risk  (02/10/2022)   Overall Financial Resource Strain (CARDIA)    Difficulty of Paying Living Expenses: Not hard at all  Food Insecurity: No Food Insecurity (05/05/2022)   Hunger Vital Sign    Worried About Running Out of Food in the Last Year: Never true    Ran Out of Food in the Last Year: Never true  Transportation Needs: No Transportation Needs (05/05/2022)   PRAPARE -  Transportation    Lack of Transportation (Medical): No    Lack of Transportation (Non-Medical): No  Physical Activity: Inactive (02/10/2022)   Exercise Vital Sign    Days of Exercise per Week: 0 days    Minutes of Exercise per Session: 0 min  Stress: No Stress Concern Present (02/10/2022)   [employer]ational [employer] - Occupational Stress Questionnaire    Feeling of Stress : Not at all  Social Connections: Socially Isolated (02/10/2022)   Social Connection and Isolation Panel [NHANES]    Frequency of Communication with Friends and Family: Three times a week    Frequency of Social Gatherings with Friends and Family: Twice a week    Attends Religious Services: Never    Active Member of Clubs or Organizations: No    Attends Club or Organization Meetings: Never    Marital Status: Divorced  Intimate Partner Violence: Not At Risk (05/05/2022)   Humiliation, Afraid, Rape, and Kick questionnaire    Fear of Current or Ex-Partner: No    Emotionally Abused: No    Physically Abused: No    Sexually Abused: No     Review of Systems   Gen: Denies any fever, chills, loss of appetite, change in weight or weight loss CV: Denies chest pain, heart palpitations, syncope, edema  Resp: Denies shortness of breath with rest, cough, wheezing, coughing up blood, and pleurisy. GI: +dysphagia, +regurgitation  GU : Denies urinary burning, blood in urine, urinary frequency, and urinary incontinence. MS: Denies joint pain, limitation of movement, swelling, cramps, and atrophy.  Derm: Denies rash, itching, dry skin, hives. Psych: Denies depression, anxiety, memory loss, hallucinations, and confusion. Heme: Denies bruising or bleeding Neuro:  Denies any headaches, dizziness, paresthesias, shaking  Physical Exam   Vital Signs in last 24 hours: Temp:  [98 F (36.7 C)-98.4 F (36.9 C)] 98.3 F (36.8 C) (05/01 1521) Pulse Rate:  [60-94] 75 (05/01 1521) Resp:  [14-22] 16 (05/01 1521) BP:  (  82-121)/(59-76) 111/65 (05/01 1521) SpO2:  [98 %-100 %] 100 % (05/01 1521)   General:   Alert,  Well-developed, well-nourished, pleasant and cooperative in NAD Head:  Normocephalic and atraumatic. Eyes:  Sclera clear, no icterus.   Conjunctiva pink. Ears:  Normal auditory acuity. Mouth:  No deformity or lesions, dentition normal. Lungs:  Clear throughout to auscultation.   No wheezes, crackles, or rhonchi. No acute distress. Heart:  Regular rate and rhythm; no murmurs, clicks, rubs,  or gallops. Abdomen:  Soft, nontender and nondistended. No masses, hepatosplenomegaly or hernias noted. Normal bowel sounds, without guarding, and without rebound.   Msk:  Symmetrical without gross deformities. Normal posture. Extremities:  Without clubbing or edema. Neurologic:  Alert and  oriented x4. Skin:  Intact without significant lesions or rashes. Psych:  Alert and cooperative. Normal mood and affect.  Labs/Studies   Recent Labs Recent Labs    05/05/22 1648 05/06/22 0453  WBC 8.6 7.7  HGB 13.7 11.6*  HCT 40.2 32.4*  PLT 288 263   BMET Recent Labs    05/05/22 1648 05/06/22 0453  NA 129* 134*  K 4.3 3.5  CL 94* 101  CO2 24 25  GLUCOSE 254* 102*  BUN 30* 27*  CREATININE 2.17* 1.96*  CALCIUM 8.4* 8.0*   LFT Recent Labs    05/05/22 1648  PROT 6.4*  ALBUMIN 1.7*  AST 25  ALT 14  ALKPHOS 155*  BILITOT 1.0   PT/INR Recent Labs    05/05/22 1648  LABPROT 11.7  INR 0.9   DG Chest Port 1 View  Result Date: 05/05/2022 CLINICAL DATA:  Questionable sepsis. EXAM: PORTABLE CHEST 1 VIEW COMPARISON:  Chest radiograph dated 10/12/2014. CT abdomen pelvis dated 04/17/2022. FINDINGS: Trace left pleural effusion and left lung base atelectasis. Pneumonia is not excluded the right lung is clear. No pneumothorax. The cardiac silhouette is within normal limits. Atherosclerotic calcification of the aorta. Moderate size hiatal hernia. No acute osseous pathology. IMPRESSION: Trace left pleural  effusion and left lung base atelectasis. Pneumonia is not excluded. Electronically Signed   By: Arash  Radparvar M.D.   On: 05/05/2022 16:10     Assessment   [name] is a 81 y.o. 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 Rourk for nausea/ vomitng and dysphagia, noted to be hypotensive aan dizzy in the clinic, sent to ED for further evaluation. Admitted for IV rehydration and with SLP eval today with presentation consistent more with esophageal dysphagia with regurgitation shortly after consumption of liquids.   Dysphagia: x2 weeks with regurgitation of food, liquids and pills shortly after consumption. History of schatzki ring, dilated in 2023. SLP eval today with presentation consistent with esophageal dysphagia. Case discussed with Dr. 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 Indications, risks and benefits of procedure discussed in detail with patient. Patient verbalized understanding and is in agreement to proceed with EGD +/- dilation.    Plan / Recommendations    NPO EGD +/- dilation tomorrow  3. PPI BID   05/06/2022, 3:38 PM  Cammie Faulstich L. Alexis Mizuno, MSN, APRN, AGNP-C Adult-Gerontology Nurse Practitioner [facility] [place]   

## 2022-05-06 NOTE — TOC Progression Note (Signed)
  Transition of Care [facility]) Screening Note   Patient Details  Name: [name] Date of Birth: 08/17/1940   Transition of Care [facility]) CM/SW Contact:    Elliot Gault, LCSW Phone Number: 05/06/2022, 9:27 AM    Transition of Care Department [facility]) has reviewed patient and no TOC needs have been identified at this time. We will continue to monitor patient advancement through interdisciplinary progression rounds. If new patient transition needs arise, please place a TOC consult.

## 2022-05-06 NOTE — H&P (View-Only) (Signed)
Gastroenterology Consult   Referring Provider: No ref. provider found Primary Care Physician:  [name], MD Primary Gastroenterologist:  Dr.Rourk   Patient ID: [name]; [id number]; 07-23-1940   Admit date: 05/05/2022  LOS: 1 day   Date of Consultation: 05/06/2022  Reason for Consultation:  dysphagia/vomiting   History of Present Illness   [name] is a 82 y.o. 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 Jena Gauss for nausea/ vomitng and dysphagia, noted to be hypotensive aan dizzy in the clinic, sent to ED for further evaluation. Admitted for IV rehydration and with SLP eval today with presentation consistent more with esophageal dysphagia with regurgitation shortly after consumption of liquids.   Patient has had nausea vomiting an decreased PO intake for the past 2 weeks. Daughter notes issues with liquids, pills and foods. Spitting substances up right after swallowing. Prior to acute onset she was doing well, eating well without issues. Denies c/o abdominal pain, no nausea. She takes omeprazole 20mg  BID at home. Daughter denies c/o heartburn but patient belches a lot. Only new meds are imodium and zofran. She has not been on any recent steroids or antibiotics.  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    Past Medical History:  Diagnosis Date   CKD (chronic kidney disease)    Dementia (HCC)    Diabetes mellitus    Diastolic heart failure (HCC)    GERD (gastroesophageal reflux disease)    Hypertension    Parkinson disease    Scoliosis    Scoliosis    UTI (lower urinary tract infection)     Past Surgical History:  Procedure Laterality Date   BALLOON DILATION N/A 02/21/2021   Procedure: BALLOON DILATION;  Surgeon: Lanelle Bal, DO;  [facility];  Service: Endoscopy;  Laterality: N/A;   BIOPSY  02/21/2021   Procedure:  BIOPSY;  Surgeon: Lanelle Bal, DO;  [facility];  Service: Endoscopy;;   boil Left    boil removed from left cheek   ESOPHAGOGASTRODUODENOSCOPY (EGD) WITH PROPOFOL N/A 02/21/2021   Procedure: ESOPHAGOGASTRODUODENOSCOPY (EGD) WITH PROPOFOL;  Surgeon: Lanelle Bal, DO;  [facility];  Service: Endoscopy;  Laterality: N/A;  11:00am   EYE SURGERY     Retina Detachment   MASS EXCISION N/A 06/27/2014   Procedure: EXCISION SUBMANDIBULAR STONE;  Surgeon: Serena Colonel, MD;  Location: [facility] OR;  Service: ENT;  Laterality: N/A;   sublingual gland removal  2016   tumor removed from rib      Prior to Admission medications   Medication Sig Start Date End Date Taking? Authorizing Provider  aspirin EC 81 MG tablet Take 81 mg by mouth daily.   Yes [provider]  calcitRIOL (ROCALTROL) 0.25 MCG capsule Take 0.25 mcg by mouth every other day.   Yes [provider]  cholecalciferol (VITAMIN D3) 25 MCG (1000 UNIT) tablet Take 1,000 Units by mouth daily.   Yes [provider]  furosemide (LASIX) 20 MG tablet Take 20 mg by mouth 2 (two) times daily. Two tablets qam and 1 tablet qpm   Yes [provider]  HUMALOG MIX 50/50 KWIKPEN (50-50) 100 UNIT/ML Kwikpen Take 20-40 Units by mouth 2 (two) times daily before a meal. 40units in the morning and 20 units in the evening 06/01/14  Yes [provider]  HYDROcodone-acetaminophen (NORCO/VICODIN) 5-325 MG  tablet Take 1 tablet by mouth every 4 (four) hours as needed. 04/17/22  Yes Jacalyn Lefevre, MD  loperamide (IMODIUM) 2 MG capsule Take 1 capsule (2 mg total) by mouth 3 (three) times daily as needed for diarrhea or loose stools. Patient taking differently: Take 2-4 mg by mouth 3 (three) times daily as needed for diarrhea or loose stools. 02/02/22  Yes Tiffany Kocher, PA-C  omeprazole (PRILOSEC) 20 MG capsule TAKE ONE (1) CAPSULE BY MOUTH TWICE DAILY BEFORE MEALS Patient taking differently: Take 20  mg by mouth 2 (two) times daily before a meal. 12/27/21  Yes Tiffany Kocher, PA-C  ondansetron (ZOFRAN) 4 MG tablet Take 1 tablet (4 mg total) by mouth every 8 (eight) hours as needed for nausea or vomiting. 02/02/22  Yes Tiffany Kocher, PA-C  Potassium Chloride ER 20 MEQ TBCR Take 1 tablet by mouth daily. 04/24/22  Yes [provider]  risedronate (ACTONEL) 150 MG tablet Take 150 mg by mouth every 30 (thirty) days. with water on empty stomach, nothing by mouth or lie down for next 30 minutes.   Yes [provider]  simvastatin (ZOCOR) 5 MG tablet Take 5 mg by mouth at bedtime. 06/01/14  Yes [provider]  carvedilol (COREG) 3.125 MG tablet Take 3.125 mg by mouth 2 (two) times daily. 09/03/21   [provider]  ketoconazole (NIZORAL) 2 % cream Apply 1 application topically daily. Patient not taking: Reported on 05/05/2022 02/05/21   Wallis Bamberg, PA-C    Current Facility-Administered Medications  Medication Dose Route Frequency Provider Last Rate Last Admin   0.9 %  sodium chloride infusion   Intravenous Continuous Emokpae, Ejiroghene E, MD 75 mL/hr at 05/06/22 0538 New Bag at 05/06/22 0538   acetaminophen (TYLENOL) tablet 650 mg  650 mg Oral Q6H PRN Emokpae, Ejiroghene E, MD       Or   acetaminophen (TYLENOL) suppository 650 mg  650 mg Rectal Q6H PRN Emokpae, Ejiroghene E, MD       aspirin EC tablet 81 mg  81 mg Oral Daily Emokpae, Ejiroghene E, MD   81 mg at 05/06/22 0851   calcitRIOL (ROCALTROL) capsule 0.25 mcg  0.25 mcg Oral QODAY Emokpae, Ejiroghene E, MD   0.25 mcg at 05/06/22 0851   cefTRIAXone (ROCEPHIN) 1 g in sodium chloride 0.9 % 100 mL IVPB  1 g Intravenous Q24H Emokpae, Ejiroghene E, MD       enoxaparin (LOVENOX) injection 30 mg  30 mg Subcutaneous Q24H Emokpae, Ejiroghene E, MD   30 mg at 05/05/22 2145   HYDROcodone-acetaminophen (NORCO/VICODIN) 5-325 MG per tablet 1 tablet  1 tablet Oral Q4H PRN Emokpae, Ejiroghene E, MD   1 tablet at 05/06/22 0851    insulin aspart (novoLOG) injection 0-15 Units  0-15 Units Subcutaneous TID WC Emokpae, Ejiroghene E, MD       insulin aspart (novoLOG) injection 0-5 Units  0-5 Units Subcutaneous QHS Emokpae, Ejiroghene E, MD   2 Units at 05/05/22 2146   insulin glargine-yfgn (SEMGLEE) injection 10 Units  10 Units Subcutaneous QHS Emokpae, Ejiroghene E, MD   10 Units at 05/05/22 2219   ondansetron (ZOFRAN) tablet 4 mg  4 mg Oral Q6H PRN Emokpae, Ejiroghene E, MD       Or   ondansetron (ZOFRAN) injection 4 mg  4 mg Intravenous Q6H PRN Emokpae, Ejiroghene E, MD       Oral care mouth rinse  15 mL Mouth Rinse PRN Emokpae, Heloise Beecham, MD  Allergies as of 05/05/2022   (No Known Allergies)    Family History  Problem Relation Age of Onset   Colon cancer Maternal Grandmother        19   Kidney cancer Daughter 59   Ovarian cancer Daughter 69    Social History   Socioeconomic History   Marital status: Divorced    Spouse name: Not on file   Number of children: Not on file   Years of education: Not on file   Highest education level: Not on file  Occupational History   Not on file  Tobacco Use   Smoking status: Never   Smokeless tobacco: Never  Vaping Use   Vaping Use: Never used  Substance and Sexual Activity   Alcohol use: No    Alcohol/week: 0.0 standard drinks of alcohol   Drug use: No   Sexual activity: Not Currently    Birth control/protection: Post-menopausal, None  Other Topics Concern   Not on file  Social History Narrative   Not on file   Social Determinants of Health   Financial Resource Strain: Low Risk  (02/10/2022)   Overall Financial Resource Strain (CARDIA)    Difficulty of Paying Living Expenses: Not hard at all  Food Insecurity: No Food Insecurity (05/05/2022)   Hunger Vital Sign    Worried About Running Out of Food in the Last Year: Never true    Ran Out of Food in the Last Year: Never true  Transportation Needs: No Transportation Needs (05/05/2022)   PRAPARE -  Administrator, Civil Service (Medical): No    Lack of Transportation (Non-Medical): No  Physical Activity: Inactive (02/10/2022)   Exercise Vital Sign    Days of Exercise per Week: 0 days    Minutes of Exercise per Session: 0 min  Stress: No Stress Concern Present (02/10/2022)   [employer] of Occupational Health - Occupational Stress Questionnaire    Feeling of Stress : Not at all  Social Connections: Socially Isolated (02/10/2022)   Social Connection and Isolation Panel [NHANES]    Frequency of Communication with Friends and Family: Three times a week    Frequency of Social Gatherings with Friends and Family: Twice a week    Attends Religious Services: Never    Database administrator or Organizations: No    Attends Banker Meetings: Never    Marital Status: Divorced  Catering manager Violence: Not At Risk (05/05/2022)   Humiliation, Afraid, Rape, and Kick questionnaire    Fear of Current or Ex-Partner: No    Emotionally Abused: No    Physically Abused: No    Sexually Abused: No     Review of Systems   Gen: Denies any fever, chills, loss of appetite, change in weight or weight loss CV: Denies chest pain, heart palpitations, syncope, edema  Resp: Denies shortness of breath with rest, cough, wheezing, coughing up blood, and pleurisy. GI: +dysphagia, +regurgitation  GU : Denies urinary burning, blood in urine, urinary frequency, and urinary incontinence. MS: Denies joint pain, limitation of movement, swelling, cramps, and atrophy.  Derm: Denies rash, itching, dry skin, hives. Psych: Denies depression, anxiety, memory loss, hallucinations, and confusion. Heme: Denies bruising or bleeding Neuro:  Denies any headaches, dizziness, paresthesias, shaking  Physical Exam   Vital Signs in last 24 hours: Temp:  [98 F (36.7 C)-98.4 F (36.9 C)] 98.3 F (36.8 C) (05/01 1521) Pulse Rate:  [60-94] 75 (05/01 1521) Resp:  [14-22] 16 (05/01 1521) BP:  (  82-121)/(59-76) 111/65 (05/01 1521) SpO2:  [98 %-100 %] 100 % (05/01 1521)   General:   Alert,  Well-developed, well-nourished, pleasant and cooperative in NAD Head:  Normocephalic and atraumatic. Eyes:  Sclera clear, no icterus.   Conjunctiva pink. Ears:  Normal auditory acuity. Mouth:  No deformity or lesions, dentition normal. Lungs:  Clear throughout to auscultation.   No wheezes, crackles, or rhonchi. No acute distress. Heart:  Regular rate and rhythm; no murmurs, clicks, rubs,  or gallops. Abdomen:  Soft, nontender and nondistended. No masses, hepatosplenomegaly or hernias noted. Normal bowel sounds, without guarding, and without rebound.   Msk:  Symmetrical without gross deformities. Normal posture. Extremities:  Without clubbing or edema. Neurologic:  Alert and  oriented x4. Skin:  Intact without significant lesions or rashes. Psych:  Alert and cooperative. Normal mood and affect.  Labs/Studies   Recent Labs Recent Labs    05/05/22 1648 05/06/22 0453  WBC 8.6 7.7  HGB 13.7 11.6*  HCT 40.2 32.4*  PLT 288 263   BMET Recent Labs    05/05/22 1648 05/06/22 0453  NA 129* 134*  K 4.3 3.5  CL 94* 101  CO2 24 25  GLUCOSE 254* 102*  BUN 30* 27*  CREATININE 2.17* 1.96*  CALCIUM 8.4* 8.0*   LFT Recent Labs    05/05/22 1648  PROT 6.4*  ALBUMIN 1.7*  AST 25  ALT 14  ALKPHOS 155*  BILITOT 1.0   PT/INR Recent Labs    05/05/22 1648  LABPROT 11.7  INR 0.9   DG Chest Port 1 View  Result Date: 05/05/2022 CLINICAL DATA:  Questionable sepsis. EXAM: PORTABLE CHEST 1 VIEW COMPARISON:  Chest radiograph dated 10/12/2014. CT abdomen pelvis dated 04/17/2022. FINDINGS: Trace left pleural effusion and left lung base atelectasis. Pneumonia is not excluded the right lung is clear. No pneumothorax. The cardiac silhouette is within normal limits. Atherosclerotic calcification of the aorta. Moderate size hiatal hernia. No acute osseous pathology. IMPRESSION: Trace left pleural  effusion and left lung base atelectasis. Pneumonia is not excluded. Electronically Signed   By: Elgie Collard M.D.   On: 05/05/2022 16:10     Assessment   [name] is a 82 y.o. 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 Jena Gauss for nausea/ vomitng and dysphagia, noted to be hypotensive aan dizzy in the clinic, sent to ED for further evaluation. Admitted for IV rehydration and with SLP eval today with presentation consistent more with esophageal dysphagia with regurgitation shortly after consumption of liquids.   Dysphagia: x2 weeks with regurgitation of food, liquids and pills shortly after consumption. History of schatzki ring, dilated in 2023. SLP eval today with presentation consistent with esophageal dysphagia. Case discussed with Dr. 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 Indications, risks and benefits of procedure discussed in detail with patient. Patient verbalized understanding and is in agreement to proceed with EGD +/- dilation.    Plan / Recommendations    NPO EGD +/- dilation tomorrow  3. PPI BID   05/06/2022, 3:38 PM  Lajuana Patchell L. Jeanmarie Hubert, MSN, APRN, AGNP-C Adult-Gerontology Nurse Practitioner [facility] at [facility]

## 2022-05-06 NOTE — Evaluation (Signed)
Clinical/Bedside Swallow Evaluation Patient Details  Name: [name] MRN: [id number] Date of Birth: 1940/03/26  Today's Date: 05/06/2022 Time: SLP Start Time (ACUTE ONLY): 1305 SLP Stop Time (ACUTE ONLY): 1331 SLP Time Calculation (min) (ACUTE ONLY): 26 min  Past Medical History:  Past Medical History:  Diagnosis Date   CKD (chronic kidney disease)    Dementia (HCC)    Diabetes mellitus    Diastolic heart failure (HCC)    GERD (gastroesophageal reflux disease)    Hypertension    Parkinson disease    Scoliosis    Scoliosis    UTI (lower urinary tract infection)    Past Surgical History:  Past Surgical History:  Procedure Laterality Date   BALLOON DILATION N/A 02/21/2021   Procedure: BALLOON DILATION;  Surgeon: Lanelle Bal, DO;  [facility];  Service: Endoscopy;  Laterality: N/A;   BIOPSY  02/21/2021   Procedure: BIOPSY;  Surgeon: Lanelle Bal, DO;  [facility];  Service: Endoscopy;;   boil Left    boil removed from left cheek   ESOPHAGOGASTRODUODENOSCOPY (EGD) WITH PROPOFOL N/A 02/21/2021   Procedure: ESOPHAGOGASTRODUODENOSCOPY (EGD) WITH PROPOFOL;  Surgeon: Lanelle Bal, DO;  [facility];  Service: Endoscopy;  Laterality: N/A;  11:00am   EYE SURGERY     Retina Detachment   MASS EXCISION N/A 06/27/2014   Procedure: EXCISION SUBMANDIBULAR STONE;  Surgeon: Serena Colonel, MD;  Location: [facility] OR;  Service: ENT;  Laterality: N/A;   sublingual gland removal  2016   tumor removed from rib     HPI:  [name] is a 82 y.o. female with medical history significant for diabetes mellitus, hypertension, dementia, CKD 4.  Patient was sent to the ED from GI office with reports of hypotension and tachycardia.  At the time of my evaluation, patient is awake alert oriented and able to answer questions, her daughter [name] is at bedside.  They report over the past month, patient has been getting progressively weak, poor oral intake, nausea and  vomiting.  Over the past 4 days she has been having diarrhea 2-4 times daily.  No abdominal pain.  She reports chronic back pain secondary to scoliosis that is unchanged.  No fevers no chills.  No difficulty breathing no cough no chest pain.Chest x-ray shows trace left pleural effusion and left lung base atelectasis but pneumonia not excluded. BSE requested. Pt had MBSS 8/31/222 and EGD 02/2021 7cm hiatal hernia  -mild schatzki ring s/p dilation  -gastritis, reactive gastropathy no h.pylori. Pt had esophagram 8/22 which showed Large hiatal hernia with significant gastroesophageal reflux. Severe esophageal dysmotility.    Assessment / Plan / Recommendation  Clinical Impression  Clinical swallow evaluation completed at bedside with Pt's daughter present. Pt lives with her daughter. Daughter reports that Pt recently started gagging and regurgitating her foods (~2 weeks ago). She had an EGD 02/2021 with dilation for mild schatzki's ring (see further results of esophageal dysphagia) and MBSS 08/2020 with recommendation for regular/thin. Oral motor evaluation is WNL with the exception of Pt wears upper dentures and does not have lower dentition. She typically wears her dentures for PO intake. Her daughter reports that she likes room temperature liquids. She consumed cup/straw sips thin water, puree, and graham cracker without signs of oropharyngeal dysphagia, however several minutes after consumption, she began regurgitating what she just ate (brown liquid). Pt has a history of esophageal dysphagia and her current presentation appears consistent with esophageal dysphagia. Consider GI consult  and/or barium pill esophagram if Pt can tolerate, possibly consider clear/full liquids per MD given 2 week history of regurgitation. Pt and daughter encouraged to implement reflux precautions (frequent, smaller meals, sit fully upright for all PO and remain so for 30+ minutes after). Pt does eat in her bed at home, but is propped up  well, per her daughter. SLP will sign off. Reconsult if indicated.    SLP Visit Diagnosis: Dysphagia, unspecified (R13.10)    Aspiration Risk  Mild aspiration risk;Risk for inadequate nutrition/hydration    Diet Recommendation Dysphagia 3 (Mech soft);Thin liquid   Liquid Administration via: Cup;Straw Medication Administration: Whole meds with liquid Supervision: Patient able to self feed;Intermittent supervision to cue for compensatory strategies Compensations: Slow rate;Small sips/bites Postural Changes: Seated upright at 90 degrees;Remain upright for at least 30 minutes after po intake    Other  Recommendations Recommended Consults: Consider esophageal assessment;Consider GI evaluation Oral Care Recommendations: Oral care BID;Staff/trained caregiver to provide oral care    Recommendations for follow up therapy are one component of a multi-disciplinary discharge planning process, led by the attending physician.  Recommendations may be updated based on patient status, additional functional criteria and insurance authorization.  Follow up Recommendations No SLP follow up      Assistance Recommended at Discharge    Functional Status Assessment Patient has not had a recent decline in their functional status  Frequency and Duration            Prognosis Prognosis for improved oropharyngeal function: Fair Barriers to Reach Goals: Other (Comment) (esophageal dysphagia history)      Swallow Study   General Date of Onset: 05/05/22 HPI: [name] is a 82 y.o. female with medical history significant for diabetes mellitus, hypertension, dementia, CKD 4.  Patient was sent to the ED from GI office with reports of hypotension and tachycardia.  At the time of my evaluation, patient is awake alert oriented and able to answer questions, her daughter [name] is at bedside.  They report over the past month, patient has been getting progressively weak, poor oral intake, nausea and vomiting.  Over  the past 4 days she has been having diarrhea 2-4 times daily.  No abdominal pain.  She reports chronic back pain secondary to scoliosis that is unchanged.  No fevers no chills.  No difficulty breathing no cough no chest pain.Chest x-ray shows trace left pleural effusion and left lung base atelectasis but pneumonia not excluded. BSE requested. Pt had MBSS 8/31/222 and EGD 02/2021 7cm hiatal hernia  -mild schatzki ring s/p dilation  -gastritis, reactive gastropathy no h.pylori. Pt had esophagram 8/22 which showed Large hiatal hernia with significant gastroesophageal reflux. Severe esophageal dysmotility. Type of Study: Bedside Swallow Evaluation Previous Swallow Assessment: MBSS 09/04/2020, regular/thin Diet Prior to this Study: Regular;Thin liquids (Level 0) Temperature Spikes Noted: No Respiratory Status: Room air History of Recent Intubation: No Behavior/Cognition: Alert;Cooperative;Pleasant mood Oral Cavity Assessment: Within Functional Limits Oral Care Completed by SLP: Yes Oral Cavity - Dentition: Dentures, top;Edentulous Vision: Functional for self-feeding Self-Feeding Abilities: Able to feed self;Needs set up Patient Positioning: Upright in bed Baseline Vocal Quality: Normal Volitional Cough: Strong Volitional Swallow: Able to elicit    Oral/Motor/Sensory Function Overall Oral Motor/Sensory Function: Within functional limits   Ice Chips Ice chips: Within functional limits Presentation: Spoon   Thin Liquid Thin Liquid: Within functional limits Presentation: Cup;Self Fed;Straw    Nectar Thick Nectar Thick Liquid: Not tested   Honey Thick Honey Thick Liquid: Not tested  Puree Puree: Within functional limits Presentation: Spoon   Solid     Solid: Within functional limits Presentation: Self Fed (graham cracker) Other Comments: Pt with regurgitation shortly after PO trials     Thank you,  Havery Moros, CCC-SLP [phone]  Timea Breed 05/06/2022,1:47 PM

## 2022-05-06 NOTE — Progress Notes (Signed)
PROGRESS NOTE     [name], is a 82 y.o. female, DOB - Feb 25, 1940, ZOX:[id number]  Admit date - 05/05/2022   Admitting Physician Onnie Boer, MD  Outpatient Primary MD for the patient is [name], MD  LOS - 1  Chief Complaint  Patient presents with   Weakness        Brief Narrative:   82 y.o. female with medical history significant for DM2, HTN, dementia, esophageal dysphagia, CKD IV, admitted on 05/04/2020 for with UTI, dehydration and hypotension in the setting of poor oral intake as a result of dysphagia with recurrent emesis/regurgitation    -Assessment and Plan: 1)Esophageal dysphagia with recurrent emesis and regurgitation- Prior endoscopy showed impressive Schatzki's ring.  -Speech pathologist consult appreciated on 05/06/2022 -GI consult appreciated on 05/06/2022 -Plans for possible EGD with dilatation on 05/07/2022 given dehydration/hypotension due to poor oral intake/regurgitation with recurrent emesis -Clear liquids for now and n.p.o. after midnight ---Hold Lovenox on 05/06/2022 to allow for EGD with dilatation on 05/07/2022  2)Hypotension -With lactic acidosis in the setting of dehydration and poor oral intake -Sepsis unlikely   -Hold home carvedilol 3.125, and Lasix 20 mg daily -Continue IV fluids  3)UTI (urinary tract infection) UA suggestive of UTI.  Has lactic acidosis and hypotension.  But she is afebrile temperature of 97.6, WBC 8.6.  Heart rate is 6200.  Respiratory rate 14-18.  Initial hypotension and lactic acidosis likely more from dehydration.  Chronic back pain from scoliosis unchanged. -Continue IV Rocephin pending further culture data -Urine culture from 02/16/22 with pansensitive E. coli  4)CKD stage -IV   renal function is currently close to recent baseline -Continue IV fluids  renally adjust medications, avoid nephrotoxic agents / dehydration  / hypotension -Resume calcitriol  5)DM2-A1c is 8.3 reflecting uncontrolled DM with hyperglycemia  PTA -Stop Lantus 10 units nightly as patient will be n.p.o. by midnight Use Novolog/Humalog Sliding scale insulin with Accu-Cheks/Fingersticks as ordered    6)Dementia--- continue supportive care, patient lives with her daughter  Status is: Inpatient   Disposition: The patient is from: Home              Anticipated d/c is to: Home              Anticipated d/c date is: 2 days              Patient currently is not medically stable to d/c. Barriers: Not Clinically Stable-   Code Status :  -  Code Status: DNR   Family Communication:Discussed with daughter at bedside  DVT Prophylaxis  :   - SCDs   enoxaparin (LOVENOX) injection 30 mg Start: 05/05/22 2145 --Hold Lovenox on 05/06/2022 to allow for EGD with dilatation on 05/07/2022  Lab Results  Component Value Date   PLT 263 05/06/2022    Inpatient Medications  Scheduled Meds:  aspirin EC  81 mg Oral Daily   calcitRIOL  0.25 mcg Oral QODAY   enoxaparin (LOVENOX) injection  30 mg Subcutaneous Q24H   insulin aspart  0-15 Units Subcutaneous TID WC   insulin aspart  0-5 Units Subcutaneous QHS   insulin glargine-yfgn  10 Units Subcutaneous QHS   Continuous Infusions:  sodium chloride 75 mL/hr at 05/06/22 0538   cefTRIAXone (ROCEPHIN)  IV     PRN Meds:.acetaminophen **OR** acetaminophen, HYDROcodone-acetaminophen, ondansetron **OR** ondansetron (ZOFRAN) IV, mouth rinse   Anti-infectives (From admission, onward)    Start     Dose/Rate Route Frequency Ordered Stop   05/06/22 1600  cefTRIAXone (ROCEPHIN) 1 g in sodium chloride 0.9 % 100 mL IVPB        1 g 200 mL/hr over 30 Minutes Intravenous Every 24 hours 05/05/22 2050     05/05/22 1600  cefTRIAXone (ROCEPHIN) 1 g in sodium chloride 0.9 % 100 mL IVPB        1 g 200 mL/hr over 30 Minutes Intravenous  Once 05/05/22 1545 05/05/22 1744      Subjective: Sameen Leas today has no fevers, no emesis,  No chest pain,   -Patient's daughter at bedside -Swallowing difficulties  persist --Hold Lovenox on 05/06/2022 to allow for EGD with dilatation on 05/07/2022  Objective: Vitals:   05/06/22 0428 05/06/22 0700 05/06/22 0852 05/06/22 1521  BP: 108/67 110/72 110/65 111/65  Pulse: 88 76 79 75  Resp: 18 18 16 16   Temp: 98.2 F (36.8 C) 98.4 F (36.9 C)  98.3 F (36.8 C)  TempSrc: Oral Oral  Oral  SpO2: 100% 100% 100% 100%  Weight:      Height:        Intake/Output Summary (Last 24 hours) at 05/06/2022 1616 Last data filed at 05/06/2022 0028 Gross per 24 hour  Intake 616.27 ml  Output 120 ml  Net 496.27 ml   Filed Weights   05/05/22 1522  Weight: 79 kg    Physical Exam  Gen:- Awake Alert,  in no apparent distress  HEENT:- [place].AT, No sclera icterus Eyes--poor vision/visual loss Neck-Supple Neck,No JVD,.  Lungs-  CTAB , fair symmetrical air movement CV- S1, S2 normal, regular  Abd-  +ve B.Sounds, Abd Soft, No tenderness, no CVA area tenderness Extremity/Skin:- +ve edema, pedal pulses present  Psych-affect is appropriate, oriented x3 Neuro-generalized weakness, no new focal deficits, no tremors  Data Reviewed: I have personally reviewed following labs and imaging studies  CBC: Recent Labs  Lab 05/05/22 1648 05/06/22 0453  WBC 8.6 7.7  NEUTROABS 6.9  --   HGB 13.7 11.6*  HCT 40.2 32.4*  MCV 82.5 79.0*  PLT 288 263   Basic Metabolic Panel: Recent Labs  Lab 05/05/22 1648 05/06/22 0453  NA 129* 134*  K 4.3 3.5  CL 94* 101  CO2 24 25  GLUCOSE 254* 102*  BUN 30* 27*  CREATININE 2.17* 1.96*  CALCIUM 8.4* 8.0*   GFR: Estimated Creatinine Clearance: 23.4 mL/min (A) (by C-G formula based on SCr of 1.96 mg/dL (H)). Liver Function Tests: Recent Labs  Lab 05/05/22 1648  AST 25  ALT 14  ALKPHOS 155*  BILITOT 1.0  PROT 6.4*  ALBUMIN 1.7*   HbA1C: Recent Labs    05/05/22 1648  HGBA1C 8.3*   Recent Results (from the past 240 hour(s))  Blood Culture (routine x 2)     Status: None (Preliminary result)   Collection Time: 05/05/22  4:17  PM   Specimen: BLOOD LEFT HAND  Result Value Ref Range Status   Specimen Description BLOOD LEFT HAND  Final   Special Requests   Final    BOTTLES DRAWN AEROBIC ONLY Blood Culture adequate volume   Culture   Final    NO GROWTH < 24 HOURS Performed at [facility], [address], [place], [place] [address]    Report Status PENDING  Incomplete  Blood Culture (routine x 2)     Status: None (Preliminary result)   Collection Time: 05/05/22  4:47 PM   Specimen: BLOOD  Result Value Ref Range Status   Specimen Description BLOOD BLOOD LEFT HAND  Final  Special Requests   Final    BOTTLES DRAWN AEROBIC ONLY Blood Culture adequate volume   Culture   Final    NO GROWTH < 24 HOURS Performed at [facility], [address], [place], [place] [address]    Report Status PENDING  Incomplete    Radiology Studies: DG Chest Port 1 View  Result Date: 05/05/2022 CLINICAL DATA:  Questionable sepsis. EXAM: PORTABLE CHEST 1 VIEW COMPARISON:  Chest radiograph dated 10/12/2014. CT abdomen pelvis dated 04/17/2022. FINDINGS: Trace left pleural effusion and left lung base atelectasis. Pneumonia is not excluded the right lung is clear. No pneumothorax. The cardiac silhouette is within normal limits. Atherosclerotic calcification of the aorta. Moderate size hiatal hernia. No acute osseous pathology. IMPRESSION: Trace left pleural effusion and left lung base atelectasis. Pneumonia is not excluded. Electronically Signed   By: Elgie Collard M.D.   On: 05/05/2022 16:10    Scheduled Meds:  aspirin EC  81 mg Oral Daily   calcitRIOL  0.25 mcg Oral QODAY   enoxaparin (LOVENOX) injection  30 mg Subcutaneous Q24H   insulin aspart  0-15 Units Subcutaneous TID WC   insulin aspart  0-5 Units Subcutaneous QHS   insulin glargine-yfgn  10 Units Subcutaneous QHS   Continuous Infusions:  sodium chloride 75 mL/hr at 05/06/22 0538   cefTRIAXone (ROCEPHIN)  IV      LOS: 1 day   Shon Hale M.D on 05/06/2022 at 4:16  PM  Go to [email or web address] - for contact info  [facility] - Office  [phone]  If 7PM-7AM, please contact night-coverage [email or web address] 05/06/2022, 4:16 PM

## 2022-05-07 ENCOUNTER — Inpatient Hospital Stay (HOSPITAL_COMMUNITY): Payer: 59 | Admitting: Anesthesiology

## 2022-05-07 ENCOUNTER — Encounter (HOSPITAL_COMMUNITY): Payer: Self-pay | Admitting: Internal Medicine

## 2022-05-07 ENCOUNTER — Encounter (HOSPITAL_COMMUNITY)
Admission: EM | Disposition: A | Discharge status: Home / Self Care | Payer: Self-pay | Source: Home / Self Care | Attending: Family Medicine

## 2022-05-07 DIAGNOSIS — N390 Urinary tract infection, site not specified: Secondary | ICD-10-CM | POA: Diagnosis not present

## 2022-05-07 DIAGNOSIS — I9589 Other hypotension: Secondary | ICD-10-CM | POA: Diagnosis not present

## 2022-05-07 DIAGNOSIS — K449 Diaphragmatic hernia without obstruction or gangrene: Secondary | ICD-10-CM

## 2022-05-07 DIAGNOSIS — K259 Gastric ulcer, unspecified as acute or chronic, without hemorrhage or perforation: Secondary | ICD-10-CM

## 2022-05-07 DIAGNOSIS — R1319 Other dysphagia: Secondary | ICD-10-CM | POA: Diagnosis not present

## 2022-05-07 DIAGNOSIS — I10 Essential (primary) hypertension: Secondary | ICD-10-CM

## 2022-05-07 DIAGNOSIS — K222 Esophageal obstruction: Secondary | ICD-10-CM

## 2022-05-07 HISTORY — PX: BIOPSY: SHX5522

## 2022-05-07 HISTORY — PX: MALONEY DILATION: SHX5535

## 2022-05-07 HISTORY — PX: ESOPHAGOGASTRODUODENOSCOPY (EGD) WITH PROPOFOL: SHX5813

## 2022-05-07 LAB — GLUCOSE, CAPILLARY
Glucose-Capillary: 130 mg/dL — ABNORMAL HIGH (ref 70–99)
Glucose-Capillary: 78 mg/dL (ref 70–99)
Glucose-Capillary: 75 mg/dL (ref 70–99)
Glucose-Capillary: 102 mg/dL — ABNORMAL HIGH (ref 70–99)
Glucose-Capillary: 123 mg/dL — ABNORMAL HIGH (ref 70–99)
Glucose-Capillary: 92 mg/dL (ref 70–99)

## 2022-05-07 LAB — URINE CULTURE: Culture: >=100000 — AB

## 2022-05-07 LAB — CULTURE, BLOOD (ROUTINE X 2)

## 2022-05-07 SURGERY — ESOPHAGOGASTRODUODENOSCOPY (EGD) WITH PROPOFOL
Anesthesia: General

## 2022-05-07 MED ORDER — DEXTROSE 50 % IV SOLN
25.0000 mL | Freq: Once | INTRAVENOUS | Status: DC
  Administered 2022-05-07: 25 mL via INTRAVENOUS

## 2022-05-07 MED ORDER — LACTATED RINGERS IV SOLN: INTRAVENOUS | Status: DC

## 2022-05-07 MED ORDER — DEXTROSE 50 % IV SOLN
INTRAVENOUS | Status: AC
  Filled 2022-05-07: qty 50

## 2022-05-07 MED ORDER — SODIUM CHLORIDE FLUSH 0.9 % IV SOLN
INTRAVENOUS | Status: AC
  Filled 2022-05-07: qty 10

## 2022-05-07 MED ORDER — SODIUM CHLORIDE 0.9 % IV SOLN: INTRAVENOUS | Status: DC

## 2022-05-07 MED ORDER — ENOXAPARIN SODIUM 30 MG/0.3ML IJ SOSY
30.0000 mg | PREFILLED_SYRINGE | INTRAMUSCULAR | Status: DC
  Administered 2022-05-08: 30 mg via SUBCUTANEOUS
  Filled 2022-05-07: qty 0.3

## 2022-05-07 MED ORDER — PANTOPRAZOLE SODIUM 40 MG IV SOLR
40.0000 mg | Freq: Two times a day (BID) | INTRAVENOUS | Status: DC
  Administered 2022-05-07 – 2022-05-09 (×5): 40 mg via INTRAVENOUS
  Filled 2022-05-07 (×6): qty 10

## 2022-05-07 MED ORDER — SUCRALFATE 1 GM/10ML PO SUSP
1.0000 g | Freq: Three times a day (TID) | ORAL | Status: DC
  Administered 2022-05-07 – 2022-05-10 (×10): 1 g via ORAL
  Filled 2022-05-07 (×12): qty 10

## 2022-05-07 MED ORDER — SODIUM CHLORIDE FLUSH 0.9 % IV SOLN
INTRAVENOUS | Status: AC
  Filled 2022-05-07: qty 40

## 2022-05-07 MED ORDER — PROPOFOL 10 MG/ML IV BOLUS
INTRAVENOUS | Status: DC | PRN
  Administered 2022-05-07: 60 mg via INTRAVENOUS
  Administered 2022-05-07 (×2): 30 mg via INTRAVENOUS
  Administered 2022-05-07: 20 mg via INTRAVENOUS
  Administered 2022-05-07: 30 mg via INTRAVENOUS
  Administered 2022-05-07: 10 mg via INTRAVENOUS

## 2022-05-07 MED ORDER — LIDOCAINE 2% (20 MG/ML) 5 ML SYRINGE
INTRAMUSCULAR | Status: DC | PRN
  Administered 2022-05-07: 50 mg via INTRAVENOUS

## 2022-05-07 MED ORDER — STERILE WATER FOR IRRIGATION IR SOLN
Status: DC | PRN
  Administered 2022-05-07: 60 mL

## 2022-05-07 NOTE — Anesthesia Preprocedure Evaluation (Signed)
Anesthesia Evaluation  Patient identified by MRN, date of birth, ID band Patient awake    Reviewed: Allergy & Precautions, H&P , NPO status , Patient's Chart, lab work & pertinent test results, reviewed documented beta blocker date and time   Airway Mallampati: II  TM Distance: >3 FB Neck ROM: full    Dental no notable dental hx.    Pulmonary neg pulmonary ROS   Pulmonary exam normal breath sounds clear to auscultation       Cardiovascular Exercise Tolerance: Good hypertension, negative cardio ROS  Rhythm:regular Rate:Normal     Neuro/Psych  PSYCHIATRIC DISORDERS     Dementia negative neurological ROS  negative psych ROS   GI/Hepatic negative GI ROS, Neg liver ROS,GERD  ,,  Endo/Other  negative endocrine ROSdiabetes    Renal/GU Renal diseasenegative Renal ROS  negative genitourinary   Musculoskeletal   Abdominal   Peds  Hematology negative hematology ROS (+)   Anesthesia Other Findings   Reproductive/Obstetrics negative OB ROS                             Anesthesia Physical Anesthesia Plan  ASA: 3 and emergent  Anesthesia Plan: General   Post-op Pain Management:    Induction:   PONV Risk Score and Plan: Propofol infusion  Airway Management Planned:   Additional Equipment:   Intra-op Plan:   Post-operative Plan:   Informed Consent: I have reviewed the patients History and Physical, chart, labs and discussed the procedure including the risks, benefits and alternatives for the proposed anesthesia with the patient or authorized representative who has indicated his/her understanding and acceptance.     Dental Advisory Given  Plan Discussed with: CRNA  Anesthesia Plan Comments:        Anesthesia Quick Evaluation

## 2022-05-07 NOTE — Progress Notes (Signed)
PROGRESS NOTE     [name], is a 82 y.o. female, DOB - Apr 20, 1940, WUJ:[id number]  Admit date - 05/05/2022   Admitting Physician Onnie Boer, MD  Outpatient Primary MD for the patient is [name], MD  LOS - 2  Chief Complaint  Patient presents with   Weakness        Brief Narrative:   82 y.o. female with medical history significant for DM2, HTN, dementia, esophageal dysphagia, CKD IV, admitted on 05/04/2020 for with UTI, dehydration and hypotension in the setting of poor oral intake as a result of dysphagia with recurrent emesis/regurgitation    -Assessment and Plan: 1)Esophageal dysphagia with recurrent emesis and regurgitation- -Speech pathologist consult appreciated on 05/06/2022 -GI consult appreciated on 05/06/2022 05/07/22 S/p  EGD with dilatation on 05/07/2022 given dehydration/hypotension due to poor oral intake/regurgitation with recurrent emesis -EGD on 05/07/22 shows--- Moderate Schatzki ring. -Dilated, Large hiatal hernia, 2 large ulcers in the cardia -biopsied, Inflamed gastric mucosa status -status post biopsy -Twice daily PPI, Carafate AC meals -Liquid diet for now  2)Hypotension -With lactic acidosis in the setting of dehydration and poor oral intake -Sepsis unlikely   -Hold home carvedilol 3.125, and Lasix 20 mg daily -BP improving -Continue IV fluids  3) E. coli UTI - --Urine culture from 05/05/2022 with pansensitive E. Coli -Continue IV Rocephin until oral intake is more reliable than switch to Keflex  4)CKD stage -IV   renal function is currently close to recent baseline -Continue IV fluids  renally adjust medications, avoid nephrotoxic agents / dehydration  / hypotension -Resume calcitriol  5)DM2-A1c is 8.3 reflecting uncontrolled DM with hyperglycemia PTA -Stop Lantus 10 units nightly until oral intake is more reliable  --use Novolog/Humalog Sliding scale insulin with Accu-Cheks/Fingersticks as ordered   6)Dementia--- continue supportive  care, patient lives with her daughter  7)Chronic back pain from scoliosis unchanged----continue as needed Tylenol  Status is: Inpatient   Disposition: The patient is from: Home              Anticipated d/c is to: Home              Anticipated d/c date is: 2 days              Patient currently is not medically stable to d/c. Barriers: Not Clinically Stable-   Code Status :  -  Code Status: DNR   Family Communication:Discussed with daughter at bedside  DVT Prophylaxis  :   - SCDs   Place and maintain sequential compression device Start: 05/06/22 1637 --Hold Lovenox on 05/06/2022 to allow for EGD with dilatation on 05/07/2022  Lab Results  Component Value Date   PLT 263 05/06/2022   Inpatient Medications  Scheduled Meds:  aspirin EC  81 mg Oral Daily   calcitRIOL  0.25 mcg Oral QODAY   dextrose       insulin aspart  0-15 Units Subcutaneous TID WC   insulin aspart  0-5 Units Subcutaneous QHS   pantoprazole (PROTONIX) IV  40 mg Intravenous Q12H   sucralfate  1 g Oral TID WC & HS   Continuous Infusions:  cefTRIAXone (ROCEPHIN)  IV 1 g (05/06/22 1647)   PRN Meds:.acetaminophen **OR** acetaminophen, dextrose, HYDROcodone-acetaminophen, ondansetron **OR** ondansetron (ZOFRAN) IV, mouth rinse   Anti-infectives (From admission, onward)    Start     Dose/Rate Route Frequency Ordered Stop   05/06/22 1600  cefTRIAXone (ROCEPHIN) 1 g in sodium chloride 0.9 % 100 mL IVPB  1 g 200 mL/hr over 30 Minutes Intravenous Every 24 hours 05/05/22 2050     05/05/22 1600  cefTRIAXone (ROCEPHIN) 1 g in sodium chloride 0.9 % 100 mL IVPB        1 g 200 mL/hr over 30 Minutes Intravenous  Once 05/05/22 1545 05/05/22 1744      Subjective: [name] today has no fevers, no emesis,  No chest pain,   -Patient's daughter at bedside -Had EGD with dilatation well on 05/07/2022 -Plan is to try liquid diet for now postprocedure  Objective: Vitals:   05/07/22 1400 05/07/22 1415 05/07/22 1422  05/07/22 1441  BP: 104/70 132/76 132/79 132/81  Pulse: 81 80 80 87  Resp: (!) 21 (!) 21 16 18   Temp:      TempSrc:    Oral  SpO2: 96% 98% 97% 91%  Weight:      Height:        Intake/Output Summary (Last 24 hours) at 05/07/2022 1511 Last data filed at 05/07/2022 1347 Gross per 24 hour  Intake 980 ml  Output 875 ml  Net 105 ml   Filed Weights   05/05/22 1522 05/07/22 1145  Weight: 79 kg 70.3 kg    Physical Exam  Gen:- Awake Alert,  in no apparent distress  HEENT:- [place].AT, No sclera icterus Eyes--poor vision/visual loss Neck-Supple Neck,No JVD,.  Lungs-  CTAB , fair symmetrical air movement CV- S1, S2 normal, regular  Abd-  +ve B.Sounds, Abd Soft, No tenderness, no CVA area tenderness Extremity/Skin:- +ve edema, pedal pulses present  Psych-affect is appropriate, oriented x3 Neuro-generalized weakness, no new focal deficits, no tremors  Data Reviewed: I have personally reviewed following labs and imaging studies  CBC: Recent Labs  Lab 05/05/22 1648 05/06/22 0453  WBC 8.6 7.7  NEUTROABS 6.9  --   HGB 13.7 11.6*  HCT 40.2 32.4*  MCV 82.5 79.0*  PLT 288 263   Basic Metabolic Panel: Recent Labs  Lab 05/05/22 1648 05/06/22 0453  NA 129* 134*  K 4.3 3.5  CL 94* 101  CO2 24 25  GLUCOSE 254* 102*  BUN 30* 27*  CREATININE 2.17* 1.96*  CALCIUM 8.4* 8.0*   GFR: Estimated Creatinine Clearance: 22.1 mL/min (A) (by C-G formula based on SCr of 1.96 mg/dL (H)). Liver Function Tests: Recent Labs  Lab 05/05/22 1648  AST 25  ALT 14  ALKPHOS 155*  BILITOT 1.0  PROT 6.4*  ALBUMIN 1.7*   HbA1C: Recent Labs    05/05/22 1648  HGBA1C 8.3*   Recent Results (from the past 240 hour(s))  Blood Culture (routine x 2)     Status: None (Preliminary result)   Collection Time: 05/05/22  4:17 PM   Specimen: BLOOD LEFT HAND  Result Value Ref Range Status   Specimen Description BLOOD LEFT HAND  Final   Special Requests   Final    BOTTLES DRAWN AEROBIC ONLY Blood Culture  adequate volume   Culture   Final    NO GROWTH 2 DAYS Performed at [facility], [address], [place], [place] [address]    Report Status PENDING  Incomplete  Urine Culture (for pregnant, neutropenic or urologic patients or patients with an indwelling urinary catheter)     Status: Abnormal   Collection Time: 05/05/22  4:30 PM   Specimen: Urine, Catheterized  Result Value Ref Range Status   Specimen Description   Final    URINE, CATHETERIZED Performed at [facility], [address], [place], [place] [address]  Special Requests   Final    NONE Performed at [facility], [address] [place], [place] [address]    Culture >=100,000 COLONIES/mL ESCHERICHIA COLI (A)  Final   Report Status 05/07/2022 FINAL  Final   Organism ID, Bacteria ESCHERICHIA COLI (A)  Final      Susceptibility   Escherichia coli - MIC*    AMPICILLIN <=2 SENSITIVE Sensitive     CEFAZOLIN <=4 SENSITIVE Sensitive     CEFEPIME <=0.12 SENSITIVE Sensitive     CEFTRIAXONE <=0.25 SENSITIVE Sensitive     CIPROFLOXACIN >=4 RESISTANT Resistant     GENTAMICIN <=1 SENSITIVE Sensitive     IMIPENEM <=0.25 SENSITIVE Sensitive     NITROFURANTOIN <=16 SENSITIVE Sensitive     TRIMETH/SULFA <=20 SENSITIVE Sensitive     AMPICILLIN/SULBACTAM <=2 SENSITIVE Sensitive     PIP/TAZO <=4 SENSITIVE Sensitive     * >=100,000 COLONIES/mL ESCHERICHIA COLI  Blood Culture (routine x 2)     Status: None (Preliminary result)   Collection Time: 05/05/22  4:47 PM   Specimen: BLOOD  Result Value Ref Range Status   Specimen Description BLOOD BLOOD LEFT HAND  Final   Special Requests   Final    BOTTLES DRAWN AEROBIC ONLY Blood Culture adequate volume   Culture   Final    NO GROWTH 2 DAYS Performed at [facility], [address] [place], [place] [address]    Report Status PENDING  Incomplete    Radiology Studies: DG Chest Port 1 View  Result Date: 05/05/2022 CLINICAL DATA:  Questionable sepsis. EXAM: PORTABLE CHEST 1 VIEW COMPARISON:   Chest radiograph dated 10/12/2014. CT abdomen pelvis dated 04/17/2022. FINDINGS: Trace left pleural effusion and left lung base atelectasis. Pneumonia is not excluded the right lung is clear. No pneumothorax. The cardiac silhouette is within normal limits. Atherosclerotic calcification of the aorta. Moderate size hiatal hernia. No acute osseous pathology. IMPRESSION: Trace left pleural effusion and left lung base atelectasis. Pneumonia is not excluded. Electronically Signed   By: Elgie Collard M.D.   On: 05/05/2022 16:10    Scheduled Meds:  aspirin EC  81 mg Oral Daily   calcitRIOL  0.25 mcg Oral QODAY   dextrose       insulin aspart  0-15 Units Subcutaneous TID WC   insulin aspart  0-5 Units Subcutaneous QHS   pantoprazole (PROTONIX) IV  40 mg Intravenous Q12H   sucralfate  1 g Oral TID WC & HS   Continuous Infusions:  cefTRIAXone (ROCEPHIN)  IV 1 g (05/06/22 1647)    LOS: 2 days   Shon Hale M.D on 05/07/2022 at 3:11 PM  Go to [email or web address] - for contact info  [facility] - Office  [phone]  If 7PM-7AM, please contact night-coverage [email or web address] 05/07/2022, 3:11 PM

## 2022-05-07 NOTE — OR Nursing (Signed)
Verbal order given by Dr Johnnette Litter, anesthesiologist, to give 25ml of dextrose IV and was given at 1236. Unable to scan due to order not being in at the time.

## 2022-05-07 NOTE — Transfer of Care (Signed)
Immediate Anesthesia Transfer of Care Note  Patient: [name]  Procedure(s) Performed: ESOPHAGOGASTRODUODENOSCOPY (EGD) WITH PROPOFOL MALONEY DILATION BIOPSY  Patient Location: PACU  Anesthesia Type:General  Level of Consciousness: drowsy  Airway & Oxygen Therapy: Patient Spontanous Breathing  Post-op Assessment: Report given to RN and Post -op Vital signs reviewed and stable  Post vital signs: Reviewed and stable  Last Vitals:  Vitals Value Taken Time  BP 85/60   Temp 98.2   Pulse 85 05/07/22 1354  Resp 26 05/07/22 1354  SpO2 99 % 05/07/22 1354  Vitals shown include unvalidated device data.  Last Pain:  Vitals:   05/07/22 1318  TempSrc:   PainSc: 0-No pain         Complications: No notable events documented.

## 2022-05-07 NOTE — Op Note (Signed)
[facility] Patient Name: [name] Procedure Date: 05/07/2022 12:59 PM MRN: [id number] Date of Birth: 1940/03/31 Attending MD: Gennette Pac , MD, [id number] CSN: [id number] Age: 82 Admit Type: Inpatient Procedure:                Upper GI endoscopy Indications:              Dysphagia Providers:                Gennette Pac, MD, Angelica Ran, Pandora Leiter, Technician Referring MD:              Medicines:                Propofol per Anesthesia Complications:            No immediate complications. Estimated Blood Loss:     Estimated blood loss was minimal. Procedure:                Pre-Anesthesia Assessment:                           - Prior to the procedure, a History and Physical                            was performed, and patient medications and                            allergies were reviewed. The patient's tolerance of                            previous anesthesia was also reviewed. The risks                            and benefits of the procedure and the sedation                            options and risks were discussed with the patient.                            All questions were answered, and informed consent                            was obtained. Prior Anticoagulants: The patient                            last took Lovenox (enoxaparin) 2 days prior to the                            procedure. ASA Grade Assessment: III - A patient                            with severe systemic disease. After reviewing the  risks and benefits, the patient was deemed in                            satisfactory condition to undergo the procedure.                           After obtaining informed consent, the endoscope was                            passed under direct vision. Throughout the                            procedure, the patient's blood pressure, pulse, and                            oxygen saturations  were monitored continuously. The                            GIF-H190 ([id number]) scope was introduced through the                            mouth, and advanced to the second part of duodenum.                            The upper GI endoscopy was accomplished without                            difficulty. The patient tolerated the procedure                            well. Scope In: 1:30:20 PM Scope Out: 1:47:41 PM Total Procedure Duration: 0 hours 17 minutes 21 seconds  Findings:      A moderate Schatzki ring was found at the gastroesophageal junction.       Otherwise, esophageal mucosa appeared normal. Stomach empty.      A large hiatal hernia was present. Within the hernia sac, in the cardia,       I identified 2 very large ulcers with deep craters and overlying eschar.       The largest was approximately 2 x 4 cm in dimensions. The smaller of the       2 was about 1-1/2 x 2-1/2 cm in dimensions. These did not appear to be       Cameron lesions. The remainder of the gastric mucosa was       mottled/erythematous. Pylorus patent. Examination bulb, second portion       revealed no abnormalities.      The scope was withdrawn and a 54 [place] Maloney dilator was passed to       full insertion with mild resistance. A look back revealed the ring had       been ruptured nicely. Subsequently, biopsies of the antrum and gastric       body were taken to evaluate for H. pylori and finally the largest ulcer       crater was biopsied at the periphery. These maneuvers were done without       apparent complication minimal bleeding. Impression:               -  Moderate Schatzki ring. -Dilated.                           - Large hiatal hernia.                           -2 large ulcers in the cardia as described?"biopsied                           -Inflamed gastric mucosa status -status post biopsy.                           -At patient request, I called [name] at                             [phone]?"reviewed findings and recommendations.                            Patient's daughter denies she is taking any NSAIDs                            at home - only Tylenol No. 3 and regular Tylenol. Moderate Sedation:      Moderate (conscious) sedation was personally administered by an       anesthesia professional. The following parameters were monitored: oxygen       saturation, heart rate, blood pressure, respiratory rate, EKG, adequacy       of pulmonary ventilation, and response to care. Recommendation:           - Return patient to hospital ward for ongoing care.                           - Full liquid diet. Twice daily PPI therapy IV.                            Carafate 1 g slurry's 4 times daily. Follow-up on                            pathology. At a minimum, will need a repeat EGD in                            12 weeks. Procedure Code(s):        --- Professional ---                           [id number], Esophagogastroduodenoscopy, flexible,                            transoral; diagnostic, including collection of                            specimen(s) by brushing or washing, when performed                            (separate procedure) Diagnosis Code(s):        --- Professional ---  K22.2, Esophageal obstruction                           K44.9, Diaphragmatic hernia without obstruction or                            gangrene                           R13.10, Dysphagia, unspecified CPT copyright 2022 [employer]. All rights reserved. The codes documented in this report are preliminary and upon coder review may  be revised to meet current compliance requirements. [name]. Colleen Scioneaux, MD Gennette Pac, MD 05/07/2022 2:13:28 PM This report has been signed electronically. Number of Addenda: 0

## 2022-05-07 NOTE — Interval H&P Note (Signed)
History and Physical Interval Note:  05/07/2022 1:07 PM  [name]  has presented today for surgery, with the diagnosis of dysphagia, regurgitation.  The various methods of treatment have been discussed with the patient and family. After consideration of risks, benefits and other options for treatment, the patient has consented to  Procedure(s) with comments: ESOPHAGOGASTRODUODENOSCOPY (EGD) WITH PROPOFOL (N/A) - +/- dilation as a surgical intervention.  The patient's history has been reviewed, patient examined, no change in status, stable for surgery.  I have reviewed the patient's chart and labs.  Questions were answered to the patient's satisfaction.     [name]    Patient was seen and examined in short stay.  Discussed with her daughter [name] when he was at the bedside.  Patient appears much improved over that seen when she was in my office 2 days ago.  Dysphagia symptoms and speech evaluation noted.  I have offered the patient an EGD with possible esophageal dilation is feasible/appropriate per plan. The risks, benefits, limitations, alternatives and imponderables have been reviewed with the patient. Potential for esophageal dilation, biopsy, etc. have also been reviewed.  Questions have been answered. All parties agreeable.

## 2022-05-07 NOTE — Care Management Important Message (Signed)
Important Message  Patient Details  Name: [name] MRN: [id number] Date of Birth: 07-10-1940   Medicare Important Message Given:  N/A - LOS <3 / Initial given by admissions     Corey Harold 05/07/2022, 11:23 AM

## 2022-05-08 ENCOUNTER — Telehealth: Payer: Self-pay | Admitting: Gastroenterology

## 2022-05-08 DIAGNOSIS — K259 Gastric ulcer, unspecified as acute or chronic, without hemorrhage or perforation: Secondary | ICD-10-CM

## 2022-05-08 DIAGNOSIS — N390 Urinary tract infection, site not specified: Secondary | ICD-10-CM | POA: Diagnosis not present

## 2022-05-08 DIAGNOSIS — I9589 Other hypotension: Secondary | ICD-10-CM | POA: Diagnosis not present

## 2022-05-08 DIAGNOSIS — K449 Diaphragmatic hernia without obstruction or gangrene: Secondary | ICD-10-CM | POA: Diagnosis not present

## 2022-05-08 DIAGNOSIS — R1319 Other dysphagia: Secondary | ICD-10-CM | POA: Diagnosis not present

## 2022-05-08 LAB — RENAL FUNCTION PANEL
Albumin: <1.5 g/dL — ABNORMAL LOW (ref 3.5–5.0)
Anion gap: 9 (ref 5–15)
BUN: 19 mg/dL (ref 8–23)
CO2: 20 mmol/L — ABNORMAL LOW (ref 22–32)
Calcium: 7.5 mg/dL — ABNORMAL LOW (ref 8.9–10.3)
Chloride: 105 mmol/L (ref 98–111)
Creatinine, Ser: 1.51 mg/dL — ABNORMAL HIGH (ref 0.44–1.00)
GFR, Estimated: 35 mL/min — ABNORMAL LOW (ref >60)
Glucose, Bld: 104 mg/dL — ABNORMAL HIGH (ref 70–99)
Phosphorus: 2.1 mg/dL — ABNORMAL LOW (ref 2.5–4.6)
Potassium: 3 mmol/L — ABNORMAL LOW (ref 3.5–5.1)
Sodium: 134 mmol/L — ABNORMAL LOW (ref 135–145)

## 2022-05-08 LAB — GLUCOSE, CAPILLARY
Glucose-Capillary: 106 mg/dL — ABNORMAL HIGH (ref 70–99)
Glucose-Capillary: 157 mg/dL — ABNORMAL HIGH (ref 70–99)
Glucose-Capillary: 128 mg/dL — ABNORMAL HIGH (ref 70–99)
Glucose-Capillary: 138 mg/dL — ABNORMAL HIGH (ref 70–99)

## 2022-05-08 LAB — CBC
HCT: 28.9 % — ABNORMAL LOW (ref 36.0–46.0)
Hemoglobin: 10.2 g/dL — ABNORMAL LOW (ref 12.0–15.0)
MCH: 28 pg (ref 26.0–34.0)
MCHC: 35.3 g/dL (ref 30.0–36.0)
MCV: 79.4 fL — ABNORMAL LOW (ref 80.0–100.0)
Platelets: 269 10*3/uL (ref 150–400)
RBC: 3.64 MIL/uL — ABNORMAL LOW (ref 3.87–5.11)
RDW: 17 % — ABNORMAL HIGH (ref 11.5–15.5)
WBC: 5 10*3/uL (ref 4.0–10.5)
nRBC: 0 % (ref 0.0–0.2)

## 2022-05-08 LAB — CULTURE, BLOOD (ROUTINE X 2)

## 2022-05-08 MED ORDER — SENNOSIDES-DOCUSATE SODIUM 8.6-50 MG PO TABS
2.0000 | ORAL_TABLET | Freq: Every day | ORAL | Status: DC
  Administered 2022-05-08 – 2022-05-09 (×2): 2 via ORAL
  Filled 2022-05-08 (×2): qty 2

## 2022-05-08 MED ORDER — POTASSIUM CHLORIDE CRYS ER 20 MEQ PO TBCR
40.0000 meq | EXTENDED_RELEASE_TABLET | ORAL | Status: AC
  Administered 2022-05-08 (×2): 40 meq via ORAL
  Filled 2022-05-08 (×2): qty 2

## 2022-05-08 MED ORDER — SODIUM CHLORIDE 0.9 % IV SOLN: INTRAVENOUS | Status: DC | PRN

## 2022-05-08 MED ORDER — CEPHALEXIN 250 MG PO CAPS: 250.0000 mg | ORAL_CAPSULE | Freq: Three times a day (TID) | ORAL | Status: DC

## 2022-05-08 NOTE — Progress Notes (Addendum)
PROGRESS NOTE     [name], is a 82 y.o. female, DOB - 03/01/1940, ZOX:[id number]  Admit date - 05/05/2022   Admitting Physician Onnie Boer, MD  Outpatient Primary MD for the patient is [name], MD  LOS - 3  Chief Complaint  Patient presents with   Weakness        Brief Narrative:   82 y.o. female with medical history significant for DM2, HTN, dementia, esophageal dysphagia, CKD IV, admitted on 05/04/2020 for with UTI, dehydration and hypotension in the setting of poor oral intake as a result of dysphagia with recurrent emesis/regurgitation    -Assessment and Plan: 1)Esophageal dysphagia with recurrent emesis and regurgitation- -Speech pathologist consult appreciated on 05/06/2022 -GI consult appreciated on 05/06/2022 S/p  EGD with dilatation on 05/07/2022 given dehydration/hypotension due to poor oral intake/regurgitation with recurrent emesis -EGD on 05/07/22 shows--- Moderate Schatzki ring. -Dilated, Large hiatal hernia, 2 large ulcers in the cardia -biopsied, Inflamed gastric mucosa status -status post biopsy -Twice daily PPI, Carafate AC meals 05/08/22 -Some dysphagia and regurgitation persist -Liquid diet advised  2)Hypotension -With lactic acidosis in the setting of dehydration and poor oral intake -Sepsis unlikely   -Hold home carvedilol 3.125, and Lasix 20 mg daily -BP improved  3) E. coli UTI - --Urine culture from 05/05/2022 with pansensitive E. Coli -Treated with IV Rocephin okay to transition to p.o. Keflex  4)CKD stage -IV   renal function is currently close to recent baseline -Continue IV fluids  renally adjust medications, avoid nephrotoxic agents / dehydration  / hypotension -Resume calcitriol  5)DM2-A1c is 8.3 reflecting uncontrolled DM with hyperglycemia PTA -Stop Lantus 10 units nightly until oral intake is more reliable  --use Novolog/Humalog Sliding scale insulin with Accu-Cheks/Fingersticks as ordered   6)Dementia--- continue supportive  care, patient lives with her daughter  7)Chronic back pain from scoliosis unchanged----continue as needed Tylenol  8)Hypokalemia---replace and recheck  Status is: Inpatient   Disposition: The patient is from: Home              Anticipated d/c is to: Home              Anticipated d/c date is: 2 days              Patient currently is not medically stable to d/c.--- Patient having difficulty with oral intake having regurgitation Barriers: Not Clinically Stable-   Code Status :  -  Code Status: DNR   Family Communication:Discussed with daughter at bedside  DVT Prophylaxis  :   - SCDs   enoxaparin (LOVENOX) injection 30 mg Start: 05/08/22 1000 Place and maintain sequential compression device Start: 05/06/22 1637 --Hold Lovenox on 05/06/2022 to allow for EGD with dilatation on 05/07/2022  Lab Results  Component Value Date   PLT 269 05/08/2022   Inpatient Medications  Scheduled Meds:  aspirin EC  81 mg Oral Daily   calcitRIOL  0.25 mcg Oral QODAY   enoxaparin (LOVENOX) injection  30 mg Subcutaneous Q24H   insulin aspart  0-15 Units Subcutaneous TID WC   insulin aspart  0-5 Units Subcutaneous QHS   pantoprazole (PROTONIX) IV  40 mg Intravenous Q12H   senna-docusate  2 tablet Oral QHS   sucralfate  1 g Oral TID WC & HS   Continuous Infusions:  sodium chloride 10 mL/hr at 05/08/22 1725   cefTRIAXone (ROCEPHIN)  IV 1 g (05/08/22 1726)   PRN Meds:.sodium chloride, acetaminophen **OR** acetaminophen, HYDROcodone-acetaminophen, ondansetron **OR** ondansetron (ZOFRAN) IV, mouth rinse  Anti-infectives (From admission, onward)    Start     Dose/Rate Route Frequency Ordered Stop   05/06/22 1600  cefTRIAXone (ROCEPHIN) 1 g in sodium chloride 0.9 % 100 mL IVPB        1 g 200 mL/hr over 30 Minutes Intravenous Every 24 hours 05/05/22 2050     05/05/22 1600  cefTRIAXone (ROCEPHIN) 1 g in sodium chloride 0.9 % 100 mL IVPB        1 g 200 mL/hr over 30 Minutes Intravenous  Once 05/05/22 1545  05/05/22 1744      Subjective: [name] today has no fevers, no emesis,  No chest pain,   --Patient was gagging a bit large -Complained of epigastric discomfort  Objective: Vitals:   05/07/22 1441 05/07/22 2051 05/08/22 0458 05/08/22 1443  BP: 132/81 123/75 112/71 115/73  Pulse: 87 81 84 85  Resp: 18 20 20 19   Temp:  98.4 F (36.9 C) 98.7 F (37.1 C) 98.6 F (37 C)  TempSrc: Oral  Oral Oral  SpO2: 91% 100% 100% 100%  Weight:      Height:        Intake/Output Summary (Last 24 hours) at 05/08/2022 1919 Last data filed at 05/08/2022 1847 Gross per 24 hour  Intake 480 ml  Output --  Net 480 ml   Filed Weights   05/05/22 1522 05/07/22 1145  Weight: 79 kg 70.3 kg    Physical Exam  Gen:- Awake Alert,  in no apparent distress  HEENT:- [place].AT, No sclera icterus Eyes--poor vision/visual loss Neck-Supple Neck,No JVD,.  Lungs-  CTAB , fair symmetrical air movement CV- S1, S2 normal, regular  Abd-  +ve B.Sounds, Abd Soft, mild epigastric tenderness on palpation, no CVA area tenderness Extremity/Skin:- +ve edema, pedal pulses present  Psych-affect is appropriate, oriented x3 Neuro-generalized weakness, no new focal deficits, no tremors  Data Reviewed: I have personally reviewed following labs and imaging studies  CBC: Recent Labs  Lab 05/05/22 1648 05/06/22 0453 05/08/22 0442  WBC 8.6 7.7 5.0  NEUTROABS 6.9  --   --   HGB 13.7 11.6* 10.2*  HCT 40.2 32.4* 28.9*  MCV 82.5 79.0* 79.4*  PLT 288 263 269   Basic Metabolic Panel: Recent Labs  Lab 05/05/22 1648 05/06/22 0453 05/08/22 0442  NA 129* 134* 134*  K 4.3 3.5 3.0*  CL 94* 101 105  CO2 24 25 20*  GLUCOSE 254* 102* 104*  BUN 30* 27* 19  CREATININE 2.17* 1.96* 1.51*  CALCIUM 8.4* 8.0* 7.5*  PHOS  --   --  2.1*   GFR: Estimated Creatinine Clearance: 28.7 mL/min (A) (by C-G formula based on SCr of 1.51 mg/dL (H)). Liver Function Tests: Recent Labs  Lab 05/05/22 1648 05/08/22 0442  AST 25  --   ALT  14  --   ALKPHOS 155*  --   BILITOT 1.0  --   PROT 6.4*  --   ALBUMIN 1.7* <1.5*   HbA1C: No results for input(s): "HGBA1C" in the last 72 hours.  Recent Results (from the past 240 hour(s))  Blood Culture (routine x 2)     Status: None (Preliminary result)   Collection Time: 05/05/22  4:17 PM   Specimen: BLOOD LEFT HAND  Result Value Ref Range Status   Specimen Description BLOOD LEFT HAND  Final   Special Requests   Final    BOTTLES DRAWN AEROBIC ONLY Blood Culture adequate volume   Culture   Final    NO GROWTH 3  DAYS Performed at [facility], [address] [place], [place] [address]    Report Status PENDING  Incomplete  Urine Culture (for pregnant, neutropenic or urologic patients or patients with an indwelling urinary catheter)     Status: Abnormal   Collection Time: 05/05/22  4:30 PM   Specimen: Urine, Catheterized  Result Value Ref Range Status   Specimen Description   Final    URINE, CATHETERIZED Performed at [facility], [address], [place], [place] [address]    Special Requests   Final    NONE Performed at [facility], [address], [place], [place] [address]    Culture >=100,000 COLONIES/mL ESCHERICHIA COLI (A)  Final   Report Status 05/07/2022 FINAL  Final   Organism ID, Bacteria ESCHERICHIA COLI (A)  Final      Susceptibility   Escherichia coli - MIC*    AMPICILLIN <=2 SENSITIVE Sensitive     CEFAZOLIN <=4 SENSITIVE Sensitive     CEFEPIME <=0.12 SENSITIVE Sensitive     CEFTRIAXONE <=0.25 SENSITIVE Sensitive     CIPROFLOXACIN >=4 RESISTANT Resistant     GENTAMICIN <=1 SENSITIVE Sensitive     IMIPENEM <=0.25 SENSITIVE Sensitive     NITROFURANTOIN <=16 SENSITIVE Sensitive     TRIMETH/SULFA <=20 SENSITIVE Sensitive     AMPICILLIN/SULBACTAM <=2 SENSITIVE Sensitive     PIP/TAZO <=4 SENSITIVE Sensitive     * >=100,000 COLONIES/mL ESCHERICHIA COLI  Blood Culture (routine x 2)     Status: None (Preliminary result)   Collection Time: 05/05/22  4:47 PM    Specimen: BLOOD  Result Value Ref Range Status   Specimen Description BLOOD BLOOD LEFT HAND  Final   Special Requests   Final    BOTTLES DRAWN AEROBIC ONLY Blood Culture adequate volume   Culture   Final    NO GROWTH 3 DAYS Performed at [facility], [address], [place], [place] [address]    Report Status PENDING  Incomplete    Scheduled Meds:  aspirin EC  81 mg Oral Daily   calcitRIOL  0.25 mcg Oral QODAY   enoxaparin (LOVENOX) injection  30 mg Subcutaneous Q24H   insulin aspart  0-15 Units Subcutaneous TID WC   insulin aspart  0-5 Units Subcutaneous QHS   pantoprazole (PROTONIX) IV  40 mg Intravenous Q12H   senna-docusate  2 tablet Oral QHS   sucralfate  1 g Oral TID WC & HS   Continuous Infusions:  sodium chloride 10 mL/hr at 05/08/22 1725   cefTRIAXone (ROCEPHIN)  IV 1 g (05/08/22 1726)    LOS: 3 days   Shon Hale M.D on 05/08/2022 at 7:19 PM  Go to [email or web address] - for contact info  [facility] - Office  [phone]  If 7PM-7AM, please contact night-coverage [email or web address] 05/08/2022, 7:19 PM

## 2022-05-08 NOTE — Telephone Encounter (Signed)
Patient being discharged today.  Please arrange hospital follow-up with Dr. Jena Gauss or [facility] in 3-4 weeks.  Dominick Morella - Please arrange CBC and CMP in 1 week.   Brooke Bonito, MSN, APRN, FNP-BC, AGACNP-BC [facility] at [facility]

## 2022-05-08 NOTE — Progress Notes (Signed)
Gastroenterology Progress Note   Referring Provider: No ref. provider found Primary Care Physician:  [name], MD Primary Gastroenterologist:  Dr. Jena Gauss  Patient ID: [name]; [id number]; 1940-04-06    Subjective   Reports improvement in dysphagia.  Was feeling fairly well until lunchtime when she has been gagging with her meals.  Complaining of some intermittent nausea.  Daughters present at bedside   Objective   Vital signs in last 24 hours Temp:  [98.4 F (36.9 C)-98.7 F (37.1 C)] 98.7 F (37.1 C) (05/03 0458) Pulse Rate:  [80-87] 84 (05/03 0458) Resp:  [16-21] 20 (05/03 0458) BP: (112-132)/(71-81) 112/71 (05/03 0458) SpO2:  [91 %-100 %] 100 % (05/03 0458)    Physical Exam General:   Alert and oriented, pleasant Head:  Normocephalic and atraumatic. Eyes:  No icterus, sclera clear. Conjuctiva pink.  Mouth:  Without lesions, mucosa pink and moist.  Heart:  S1, S2 present, no murmurs noted.  Lungs: Clear to auscultation bilaterally, without wheezing, rales, or rhonchi.  Abdomen:  Bowel sounds present, soft, non-distended.  Mild TTP to epigastrium.  No HSM or hernias noted. No rebound or guarding. No masses appreciated  Neurologic:  Alert and  oriented x4;  grossly normal neurologically. Psych:  Alert and cooperative. Normal mood and affect.  Intake/Output from previous day: 05/02 0701 - 05/03 0700 In: 400 [I.V.:400] Out: -  Intake/Output this shift: No intake/output data recorded.  Lab Results  Recent Labs    05/05/22 1648 05/06/22 0453 05/08/22 0442  WBC 8.6 7.7 5.0  HGB 13.7 11.6* 10.2*  HCT 40.2 32.4* 28.9*  PLT 288 263 269   BMET Recent Labs    05/05/22 1648 05/06/22 0453 05/08/22 0442  NA 129* 134* 134*  K 4.3 3.5 3.0*  CL 94* 101 105  CO2 24 25 20*  GLUCOSE 254* 102* 104*  BUN 30* 27* 19  CREATININE 2.17* 1.96* 1.51*  CALCIUM 8.4* 8.0* 7.5*   LFT Recent Labs    05/05/22 1648 05/08/22 0442  PROT 6.4*  --   ALBUMIN 1.7*  <1.5*  AST 25  --   ALT 14  --   ALKPHOS 155*  --   BILITOT 1.0  --    PT/INR Recent Labs    05/05/22 1648  LABPROT 11.7  INR 0.9   Hepatitis Panel No results for input(s): "HEPBSAG", "HCVAB", "HEPAIGM", "HEPBIGM" in the last 72 hours.  Studies/Results DG Chest Port 1 View  Result Date: 05/05/2022 CLINICAL DATA:  Questionable sepsis. EXAM: PORTABLE CHEST 1 VIEW COMPARISON:  Chest radiograph dated 10/12/2014. CT abdomen pelvis dated 04/17/2022. FINDINGS: Trace left pleural effusion and left lung base atelectasis. Pneumonia is not excluded the right lung is clear. No pneumothorax. The cardiac silhouette is within normal limits. Atherosclerotic calcification of the aorta. Moderate size hiatal hernia. No acute osseous pathology. IMPRESSION: Trace left pleural effusion and left lung base atelectasis. Pneumonia is not excluded. Electronically Signed   By: Elgie Collard M.D.   On: 05/05/2022 16:10   CT ABDOMEN PELVIS WO CONTRAST  Result Date: 04/17/2022 CLINICAL DATA:  Abdominal pain EXAM: CT ABDOMEN AND PELVIS WITHOUT CONTRAST TECHNIQUE: Multidetector CT imaging of the abdomen and pelvis was performed following the standard protocol without IV contrast. RADIATION DOSE REDUCTION: This exam was performed according to the departmental dose-optimization program which includes automated exposure control, adjustment of the mA and/or kV according to patient size and/or use of iterative reconstruction technique. COMPARISON:  CT abdomen pelvis, 02/16/2022 MR abdomen, 08/27/2021, pelvic  ultrasound, 02/17/2022 FINDINGS: Lower chest: No acute abnormality. 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 Coronary artery calcifications. Trace bilateral pleural effusions and associated atelectasis or consolidation. Hepatobiliary: No solid liver abnormality is seen. No gallstones. Unchanged intra and extrahepatic biliary ductal dilatation. Pancreas: Unremarkable. No pancreatic ductal  dilatation or surrounding inflammatory changes. Spleen: Normal in size without significant abnormality. Adrenals/Urinary Tract: Adrenal glands are unremarkable. Kidneys are normal, without renal calculi, solid lesion, or hydronephrosis. Bladder is unremarkable. Stomach/Bowel: Stomach is within normal limits. Descending duodenal diverticulum. Appendix appears normal. No evidence of bowel wall thickening, distention, or inflammatory changes. Descending and sigmoid diverticulosis. Vascular/Lymphatic: Aortic atherosclerosis. No enlarged abdominal or pelvic lymph nodes. Reproductive: No mass mass. Unchanged left ovarian or adnexal cyst measuring 7.3 x 5.7 cm (series 2, 63). Other: No abdominal wall hernia. Anasarca. No ascites. 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 Musculoskeletal: No acute or significant osseous findings. IMPRESSION: 1. No acute noncontrast CT findings of the abdomen or pelvis to explain abdominal pain. 2. Descending and sigmoid diverticulosis without evidence of acute diverticulitis. 3. 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 4. 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 This is of uncertain nature, and previously characterized by dedicated MR. 5. Unchanged intra and extrahepatic biliary ductal dilatation, which may be secondary to mass effect from retroperitoneal mass described above. Correlate for biochemical evidence of biliary obstruction. 6. Unchanged left ovarian or adnexal cyst measuring 7.3 x 5.7 cm, previously assessed by dedicated pelvic ultrasound. 7. Trace bilateral pleural effusions and associated atelectasis or consolidation. 8. Anasarca. 9. Coronary artery disease. Aortic Atherosclerosis (ICD10-I70.0).  Electronically Signed   By: Jearld Lesch M.D.   On: 04/17/2022 15:41    Assessment  82 y.o. female with a history of Parkinson's, diabetes, possible early onset dementia, CKD who presented to the hospital after office visit with Dr. Jena Gauss for nausea/vomiting, and dysphagia and was noted to be hypotensive and dizzy in the ED.  She is admitted for IV hydration and underwent evaluation with speech therapy who found that symptoms appear to be more consistent with esophageal dysphagia with regurgitation.  GI consulted for further evaluation.  Dysphagia: Patient previously reported 2-week history of regurgitation of food, liquids, and pills shortly after consumption.  History of Schatzki's ring which was dilated in 2023.  Evaluated by SLP this admission he felt as though her presentation was consistent with esophageal dysphagia.  She underwent EGD yesterday 5/2 with evidence of moderate Schatzki's ring s/p dilation and large hiatal hernia.  Today she reports her swallowing is improved slightly.  Gastric ulcers, nausea, epigastric pain: Large hiatal hernia and 2 large gastric ulcers with inflamed mucosa identified in the cardia.  She was advised PPI twice daily and Carafate 1 g 4 times daily.  Her diet was advanced however today at lunch she reported gagging and nausea with eating.  She has some mild TTP to epigastrium on exam today.  Also with a mild drop in hemoglobin today therefore we will back off her diet from a soft regular diet to full liquids and reassess ability to advance diet tomorrow.  She will continue on Carafate and PPI twice daily on discharge.  He will need a repeat EGD in 12 weeks.  Provided update to family at the bedside  today regarding plan of care of continuing medications, follow-up in the clinic in 3-4 weeks, and repeat EGD in 12 weeks.  Plan / Recommendations  PPI IV BID, transition to p.o. twice daily on discharge. Carafate 1g QID suspension Avoid NSAIDs Decrease diet to full  liquids Repeat EGD in 12 weeks Follow-up outpatient in 3-4 weeks.    LOS: 3 days    05/08/2022, 2:01 PM   Brooke Bonito, MSN, FNP-BC, AGACNP-BC [facility]

## 2022-05-08 NOTE — Care Management Important Message (Signed)
Important Message  Patient Details  Name: [name] MRN: [id number] Date of Birth: 1940-02-08   Medicare Important Message Given:  Yes     Corey Harold 05/08/2022, 11:41 AM

## 2022-05-09 DIAGNOSIS — R1319 Other dysphagia: Secondary | ICD-10-CM | POA: Diagnosis not present

## 2022-05-09 DIAGNOSIS — I9589 Other hypotension: Secondary | ICD-10-CM | POA: Diagnosis not present

## 2022-05-09 DIAGNOSIS — K259 Gastric ulcer, unspecified as acute or chronic, without hemorrhage or perforation: Secondary | ICD-10-CM | POA: Diagnosis not present

## 2022-05-09 DIAGNOSIS — K449 Diaphragmatic hernia without obstruction or gangrene: Secondary | ICD-10-CM | POA: Diagnosis not present

## 2022-05-09 LAB — BASIC METABOLIC PANEL
Anion gap: 4 — ABNORMAL LOW (ref 5–15)
BUN: 18 mg/dL (ref 8–23)
CO2: 22 mmol/L (ref 22–32)
Calcium: 7.5 mg/dL — ABNORMAL LOW (ref 8.9–10.3)
Chloride: 109 mmol/L (ref 98–111)
Creatinine, Ser: 1.44 mg/dL — ABNORMAL HIGH (ref 0.44–1.00)
GFR, Estimated: 37 mL/min — ABNORMAL LOW (ref >60)
Glucose, Bld: 125 mg/dL — ABNORMAL HIGH (ref 70–99)
Potassium: 4 mmol/L (ref 3.5–5.1)
Sodium: 135 mmol/L (ref 135–145)

## 2022-05-09 LAB — CULTURE, BLOOD (ROUTINE X 2): Special Requests: ADEQUATE

## 2022-05-09 LAB — GLUCOSE, CAPILLARY
Glucose-Capillary: 125 mg/dL — ABNORMAL HIGH (ref 70–99)
Glucose-Capillary: 103 mg/dL — ABNORMAL HIGH (ref 70–99)
Glucose-Capillary: 135 mg/dL — ABNORMAL HIGH (ref 70–99)
Glucose-Capillary: 152 mg/dL — ABNORMAL HIGH (ref 70–99)

## 2022-05-09 MED ORDER — CEPHALEXIN 500 MG PO CAPS
500.0000 mg | ORAL_CAPSULE | Freq: Two times a day (BID) | ORAL | Status: DC
  Administered 2022-05-09 – 2022-05-10 (×2): 500 mg via ORAL
  Filled 2022-05-09 (×2): qty 1

## 2022-05-09 MED ORDER — PANTOPRAZOLE SODIUM 40 MG PO TBEC
40.0000 mg | DELAYED_RELEASE_TABLET | Freq: Two times a day (BID) | ORAL | Status: DC
  Administered 2022-05-09 – 2022-05-10 (×2): 40 mg via ORAL
  Filled 2022-05-09 (×2): qty 1

## 2022-05-09 NOTE — Progress Notes (Signed)
PROGRESS NOTE     [name], is a 82 y.o. female, DOB - 1940/09/12, ZOX:[id number]  Admit date - 05/05/2022   Admitting Physician Onnie Boer, MD  Outpatient Primary MD for the patient is [name], MD  LOS - 4  Chief Complaint  Patient presents with   Weakness        Brief Narrative:   82 y.o. female with medical history significant for DM2, HTN, dementia, esophageal dysphagia, CKD IV, admitted on 05/04/2020 for with UTI, dehydration and hypotension in the setting of poor oral intake as a result of dysphagia with recurrent emesis/regurgitation    -Assessment and Plan: 1)Esophageal dysphagia with recurrent emesis and regurgitation- -Speech pathologist consult appreciated on 05/06/2022 -GI consult appreciated on 05/06/2022 S/p  EGD with dilatation on 05/07/2022 given dehydration/hypotension due to poor oral intake/regurgitation with recurrent emesis -EGD on 05/07/22 shows--- Moderate Schatzki ring. -Dilated, Large hiatal hernia, 2 large ulcers in the cardia -biopsied, Inflamed gastric mucosa status -status post biopsy -Twice daily PPI, Carafate AC meals 05/09/22 -Tolerating liquid diet somewhat better today -Possible discharge home on 05/10/2022 if repeat CBC shows stable Hgb--patient has a rather large gastric ulcers  2)Hypotension -With lactic acidosis in the setting of dehydration and poor oral intake -Sepsis unlikely   -Hold home carvedilol 3.125, and Lasix 20 mg daily -BP improved  3) E. coli UTI - --Urine culture from 05/05/2022 with pansensitive E. Coli -Treated with IV Rocephin okay to transition to p.o. Keflex  4)CKD stage -IV   renal function is currently close to recent baseline -Continue IV fluids  renally adjust medications, avoid nephrotoxic agents / dehydration  / hypotension -Resume calcitriol  5)DM2-A1c is 8.3 reflecting uncontrolled DM with hyperglycemia PTA -Stopped Lantus 10 units nightly until oral intake is more reliable  --use Novolog/Humalog  Sliding scale insulin with Accu-Cheks/Fingersticks as ordered   6)Dementia--- continue supportive care, patient lives with her daughter  7)Chronic back pain from scoliosis unchanged----continue as needed Tylenol  8)Hypokalemia---replace and recheck  Status is: Inpatient   Disposition: The patient is from: Home              Anticipated d/c is to: Home              Anticipated d/c date is: 1 day              Patient currently is not medically stable to d/c.--- Patient having difficulty with oral intake having regurgitation Barriers: Not Clinically Stable-   Code Status :  -  Code Status: DNR   Family Communication:Discussed with daughter at bedside  DVT Prophylaxis  :   - SCDs   Place and maintain sequential compression device Start: 05/06/22 1637 --Hold Lovenox on 05/06/2022 to allow for EGD with dilatation on 05/07/2022  Lab Results  Component Value Date   PLT 269 05/08/2022   Inpatient Medications  Scheduled Meds:  aspirin EC  81 mg Oral Daily   calcitRIOL  0.25 mcg Oral QODAY   cephALEXin  500 mg Oral Q12H   insulin aspart  0-15 Units Subcutaneous TID WC   insulin aspart  0-5 Units Subcutaneous QHS   pantoprazole  40 mg Oral BID   senna-docusate  2 tablet Oral QHS   sucralfate  1 g Oral TID WC & HS   Continuous Infusions:  sodium chloride 10 mL/hr at 05/08/22 1725   PRN Meds:.sodium chloride, acetaminophen **OR** acetaminophen, HYDROcodone-acetaminophen, ondansetron **OR** [DISCONTINUED] ondansetron (ZOFRAN) IV, mouth rinse   Anti-infectives (From admission, onward)  Start     Dose/Rate Route Frequency Ordered Stop   05/09/22 1800  cephALEXin (KEFLEX) capsule 500 mg        500 mg Oral Every 12 hours 05/09/22 1648 05/11/22 2159   05/09/22 1600  cephALEXin (KEFLEX) capsule 250 mg  Status:  Discontinued        250 mg Oral 3 times daily 05/08/22 1922 05/09/22 1648   05/06/22 1600  cefTRIAXone (ROCEPHIN) 1 g in sodium chloride 0.9 % 100 mL IVPB  Status:  Discontinued         1 g 200 mL/hr over 30 Minutes Intravenous Every 24 hours 05/05/22 2050 05/08/22 1922   05/05/22 1600  cefTRIAXone (ROCEPHIN) 1 g in sodium chloride 0.9 % 100 mL IVPB        1 g 200 mL/hr over 30 Minutes Intravenous  Once 05/05/22 1545 05/05/22 1744      Subjective: [name] today has no fevers,  No chest pain,   --- Daughter at bedside, -Oral intake remains challenging but no emesis at this time----did better with liquids today  Objective: Vitals:   05/08/22 1443 05/08/22 2126 05/09/22 0519 05/09/22 1259  BP: 115/73 129/72 138/78 (!) 141/71  Pulse: 85 88 77 72  Resp: 19 20 20    Temp: 98.6 F (37 C) 98.6 F (37 C) 98.4 F (36.9 C) 98.3 F (36.8 C)  TempSrc: Oral Oral Oral Oral  SpO2: 100% 100% 100% 100%  Weight:      Height:        Intake/Output Summary (Last 24 hours) at 05/09/2022 1730 Last data filed at 05/09/2022 1300 Gross per 24 hour  Intake 480 ml  Output 400 ml  Net 80 ml   Filed Weights   05/05/22 1522 05/07/22 1145  Weight: 79 kg 70.3 kg    Physical Exam  Gen:- Awake Alert,  in no apparent distress  HEENT:- [place].AT, No sclera icterus Eyes--poor vision/visual loss Neck-Supple Neck,No JVD,.  Lungs-  CTAB , fair symmetrical air movement CV- S1, S2 normal, regular  Abd-  +ve B.Sounds, Abd Soft, mild epigastric tenderness on palpation, no CVA area tenderness Extremity/Skin:- +ve edema, pedal pulses present  Psych-affect is appropriate, oriented x3 Neuro-generalized weakness, no new focal deficits, no tremors  Data Reviewed: I have personally reviewed following labs and imaging studies  CBC: Recent Labs  Lab 05/05/22 1648 05/06/22 0453 05/08/22 0442  WBC 8.6 7.7 5.0  NEUTROABS 6.9  --   --   HGB 13.7 11.6* 10.2*  HCT 40.2 32.4* 28.9*  MCV 82.5 79.0* 79.4*  PLT 288 263 269   Basic Metabolic Panel: Recent Labs  Lab 05/05/22 1648 05/06/22 0453 05/08/22 0442 05/09/22 0549  NA 129* 134* 134* 135  K 4.3 3.5 3.0* 4.0  CL 94* 101 105 109   CO2 24 25 20* 22  GLUCOSE 254* 102* 104* 125*  BUN 30* 27* 19 18  CREATININE 2.17* 1.96* 1.51* 1.44*  CALCIUM 8.4* 8.0* 7.5* 7.5*  PHOS  --   --  2.1*  --    GFR: Estimated Creatinine Clearance: 30.1 mL/min (A) (by C-G formula based on SCr of 1.44 mg/dL (H)). Liver Function Tests: Recent Labs  Lab 05/05/22 1648 05/08/22 0442  AST 25  --   ALT 14  --   ALKPHOS 155*  --   BILITOT 1.0  --   PROT 6.4*  --   ALBUMIN 1.7* <1.5*   HbA1C: No results for input(s): "HGBA1C" in the last 72 hours.  Recent  Results (from the past 240 hour(s))  Blood Culture (routine x 2)     Status: None (Preliminary result)   Collection Time: 05/05/22  4:17 PM   Specimen: BLOOD LEFT HAND  Result Value Ref Range Status   Specimen Description BLOOD LEFT HAND  Final   Special Requests   Final    BOTTLES DRAWN AEROBIC ONLY Blood Culture adequate volume   Culture   Final    NO GROWTH 4 DAYS Performed at [facility], [address], [place], [place] [address]    Report Status PENDING  Incomplete  Urine Culture (for pregnant, neutropenic or urologic patients or patients with an indwelling urinary catheter)     Status: Abnormal   Collection Time: 05/05/22  4:30 PM   Specimen: Urine, Catheterized  Result Value Ref Range Status   Specimen Description   Final    URINE, CATHETERIZED Performed at [facility], [address] [place], [place] [address]    Special Requests   Final    NONE Performed at [facility], [address], [place], [place] [address]    Culture >=100,000 COLONIES/mL ESCHERICHIA COLI (A)  Final   Report Status 05/07/2022 FINAL  Final   Organism ID, Bacteria ESCHERICHIA COLI (A)  Final      Susceptibility   Escherichia coli - MIC*    AMPICILLIN <=2 SENSITIVE Sensitive     CEFAZOLIN <=4 SENSITIVE Sensitive     CEFEPIME <=0.12 SENSITIVE Sensitive     CEFTRIAXONE <=0.25 SENSITIVE Sensitive     CIPROFLOXACIN >=4 RESISTANT Resistant     GENTAMICIN <=1 SENSITIVE Sensitive     IMIPENEM  <=0.25 SENSITIVE Sensitive     NITROFURANTOIN <=16 SENSITIVE Sensitive     TRIMETH/SULFA <=20 SENSITIVE Sensitive     AMPICILLIN/SULBACTAM <=2 SENSITIVE Sensitive     PIP/TAZO <=4 SENSITIVE Sensitive     * >=100,000 COLONIES/mL ESCHERICHIA COLI  Blood Culture (routine x 2)     Status: None (Preliminary result)   Collection Time: 05/05/22  4:47 PM   Specimen: BLOOD  Result Value Ref Range Status   Specimen Description BLOOD BLOOD LEFT HAND  Final   Special Requests   Final    BOTTLES DRAWN AEROBIC ONLY Blood Culture adequate volume   Culture   Final    NO GROWTH 4 DAYS Performed at [facility], [address], [place], [place] [address]    Report Status PENDING  Incomplete    Scheduled Meds:  aspirin EC  81 mg Oral Daily   calcitRIOL  0.25 mcg Oral QODAY   cephALEXin  500 mg Oral Q12H   insulin aspart  0-15 Units Subcutaneous TID WC   insulin aspart  0-5 Units Subcutaneous QHS   pantoprazole  40 mg Oral BID   senna-docusate  2 tablet Oral QHS   sucralfate  1 g Oral TID WC & HS   Continuous Infusions:  sodium chloride 10 mL/hr at 05/08/22 1725    LOS: 4 days   Shon Hale M.D on 05/09/2022 at 5:30 PM  Go to [email or web address] - for contact info  [facility] - Office  [phone]  If 7PM-7AM, please contact night-coverage [email or web address] 05/09/2022, 5:30 PM

## 2022-05-09 NOTE — Progress Notes (Signed)
Katrinka Blazing, M.D. Gastroenterology & Hepatology   Interval History:  NAEON.. Daughter states that she was able to drink liquids without any nausea or vomiting.  Patient reports feeling well and denies any complaints at the moment, some mild discomfort in her upper abdomen.  No melena or hematochezia.  Inpatient Medications:  Current Facility-Administered Medications:    0.9 %  sodium chloride infusion, , Intravenous, PRN, Mariea Clonts, Courage, MD, Last Rate: 10 mL/hr at 05/08/22 1725, New Bag at 05/08/22 1725   acetaminophen (TYLENOL) tablet 650 mg, 650 mg, Oral, Q6H PRN **OR** acetaminophen (TYLENOL) suppository 650 mg, 650 mg, Rectal, Q6H PRN, Corbin Ade, MD   aspirin EC tablet 81 mg, 81 mg, Oral, Daily, Rourk, [name], MD, 81 mg at 05/08/22 1610   calcitRIOL (ROCALTROL) capsule 0.25 mcg, 0.25 mcg, Oral, Sharlene Dory, MD, 0.25 mcg at 05/08/22 9604   cephALEXin (KEFLEX) capsule 250 mg, 250 mg, Oral, TID, Mariea Clonts, Courage, MD   HYDROcodone-acetaminophen (NORCO/VICODIN) 5-325 MG per tablet 1 tablet, 1 tablet, Oral, Q4H PRN, Corbin Ade, MD, 1 tablet at 05/08/22 2120   insulin aspart (novoLOG) injection 0-15 Units, 0-15 Units, Subcutaneous, TID WC, Rourk, [name], MD, 2 Units at 05/08/22 1726   insulin aspart (novoLOG) injection 0-5 Units, 0-5 Units, Subcutaneous, QHS, Rourk, [name], MD, 2 Units at 05/05/22 2146   ondansetron (ZOFRAN) tablet 4 mg, 4 mg, Oral, Q6H PRN, 4 mg at 05/08/22 1226 **OR** ondansetron (ZOFRAN) injection 4 mg, 4 mg, Intravenous, Q6H PRN, Corbin Ade, MD   Oral care mouth rinse, 15 mL, Mouth Rinse, PRN, Rourk, [name], MD   pantoprazole (PROTONIX) injection 40 mg, 40 mg, Intravenous, Q12H, Corbin Ade, MD, 40 mg at 05/08/22 2116   senna-docusate (Senokot-S) tablet 2 tablet, 2 tablet, Oral, QHS, Emokpae, Courage, MD, 2 tablet at 05/08/22 1839   sucralfate (CARAFATE) 1 GM/10ML suspension 1 g, 1 g, Oral, TID WC & HS, Rourk, [name], MD, 1 g at  05/08/22 2116   I/O    Intake/Output Summary (Last 24 hours) at 05/09/2022 0756 Last data filed at 05/08/2022 2128 Gross per 24 hour  Intake 480 ml  Output 400 ml  Net 80 ml     Physical Exam: Temp:  [98.4 F (36.9 C)-98.6 F (37 C)] 98.4 F (36.9 C) (05/04 0519) Pulse Rate:  [77-88] 77 (05/04 0519) Resp:  [19-20] 20 (05/04 0519) BP: (115-138)/(72-78) 138/78 (05/04 0519) SpO2:  [100 %] 100 % (05/04 0519)  Temp (24hrs), Avg:98.5 F (36.9 C), Min:98.4 F (36.9 C), Max:98.6 F (37 C) GENERAL: The patient is Awake Aox1-2, in no acute distress. HEENT: Head is normocephalic and atraumatic. EOMI are intact. Mouth is well hydrated and without lesions. NECK: Supple. No masses LUNGS: Clear to auscultation. No presence of rhonchi/wheezing/rales. Adequate chest expansion HEART: RRR, normal s1 and s2. ABDOMEN: Soft, nontender, no guarding, no peritoneal signs, and nondistended. BS +. No masses. EXTREMITIES: Without any cyanosis, clubbing, rash, lesions or edema. NEUROLOGIC: Aox1-2, no focal motor deficit. SKIN: no jaundice, no rashes  Laboratory Data: CBC:     Component Value Date/Time   WBC 5.0 05/08/2022 0442   RBC 3.64 (L) 05/08/2022 0442   HGB 10.2 (L) 05/08/2022 0442   HGB 13.1 02/02/2022 1531   HCT 28.9 (L) 05/08/2022 0442   HCT 41.6 02/02/2022 1531   PLT 269 05/08/2022 0442   PLT 330 02/02/2022 1531   MCV 79.4 (L) 05/08/2022 0442   MCV 81 02/02/2022 1531  MCH 28.0 05/08/2022 0442   MCHC 35.3 05/08/2022 0442   RDW 17.0 (H) 05/08/2022 0442   RDW 14.8 02/02/2022 1531   LYMPHSABS 1.0 05/05/2022 1648   LYMPHSABS 1.7 02/02/2022 1531   MONOABS 0.6 05/05/2022 1648   EOSABS 0.0 05/05/2022 1648   EOSABS 0.1 02/02/2022 1531   BASOSABS 0.0 05/05/2022 1648   BASOSABS 0.0 02/02/2022 1531   COAG:  Lab Results  Component Value Date   INR 0.9 05/05/2022    BMP:     Latest Ref Rng & Units 05/09/2022    5:49 AM 05/08/2022    4:42 AM 05/06/2022    4:53 AM  BMP  Glucose 70 - 99  mg/dL 409  811  914   BUN 8 - 23 mg/dL 18  19  27    Creatinine 0.44 - 1.00 mg/dL 7.82  9.56  2.13   Sodium 135 - 145 mmol/L 135  134  134   Potassium 3.5 - 5.1 mmol/L 4.0  3.0  3.5   Chloride 98 - 111 mmol/L 109  105  101   CO2 22 - 32 mmol/L 22  20  25    Calcium 8.9 - 10.3 mg/dL 7.5  7.5  8.0     HEPATIC:     Latest Ref Rng & Units 05/08/2022    4:42 AM 05/05/2022    4:48 PM 04/17/2022    1:11 PM  Hepatic Function  Total Protein 6.5 - 8.1 g/dL  6.4  6.0   Albumin 3.5 - 5.0 g/dL <0.8  1.7  1.8   AST 15 - 41 U/L  25  18   ALT 0 - 44 U/L  14  14   Alk Phosphatase 38 - 126 U/L  155  130   Total Bilirubin 0.3 - 1.2 mg/dL  1.0  0.7     CARDIAC:  Lab Results  Component Value Date   TROPONINI <0.03 10/13/2014      Imaging: I personally reviewed and interpreted the available labs, imaging and endoscopic files.   Assessment/Plan: 82 y.o. female with a history of Parkinson's, diabetes, possible early onset dementia, CKD, GERD, diabetes, diastolic heart failure, hypertension, who came to the hospital after presenting dizziness, nausea and vomiting.  The patient was admitted to the hospital after being found to have severe dehydration.  Underwent EGD during the current hospitalization which showed presence of moderate Schatzki's ring which was dilated and a large hiatal hernia with associated large gastric ulcers x 2.  She has been kept on PPI twice daily and Carafate with symptom improvement although she is still not eating as in the past.  I discussed with the daughter in depth the fact that she had a large hiatal hernia that is likely contributing to her dysphagia.  If she persists with dysphagia when advancing her diet despite dilation, there are very few options including surgery for hiatal hernia correction versus surgically placed feeding tube.  Daughter understood this and will like to discuss with her sister if the patient does not have any improvement; she is not interested in her mother  having surgery for her hiatal hernia.  Will recommend continue PPI twice daily and Carafate, as well as repeat an EGD in 12 weeks, will set up follow-up as outpatient.  PPI IV BID, transition to p.o. twice daily on discharge. Carafate 1g QID suspension Avoid NSAIDs full liquid diet, can advance as tolerated If persistent dysphagia, consider surgical consult for feeding tube placement Follow-up pathology reports Repeat EGD in  12 weeks Follow-up outpatient in 3-4 weeks. GI service will sign-off, please call us back if you have any more questions.  Katrinka Blazing, MD Gastroenterology and Hepatology [facility]

## 2022-05-10 DIAGNOSIS — K259 Gastric ulcer, unspecified as acute or chronic, without hemorrhage or perforation: Secondary | ICD-10-CM | POA: Diagnosis not present

## 2022-05-10 DIAGNOSIS — I9589 Other hypotension: Secondary | ICD-10-CM | POA: Diagnosis not present

## 2022-05-10 DIAGNOSIS — R1319 Other dysphagia: Secondary | ICD-10-CM | POA: Diagnosis not present

## 2022-05-10 LAB — GLUCOSE, CAPILLARY
Glucose-Capillary: 154 mg/dL — ABNORMAL HIGH (ref 70–99)
Glucose-Capillary: 158 mg/dL — ABNORMAL HIGH (ref 70–99)

## 2022-05-10 LAB — CBC
HCT: 29 % — ABNORMAL LOW (ref 36.0–46.0)
Hemoglobin: 10 g/dL — ABNORMAL LOW (ref 12.0–15.0)
MCH: 28.2 pg (ref 26.0–34.0)
MCHC: 34.5 g/dL (ref 30.0–36.0)
MCV: 81.7 fL (ref 80.0–100.0)
Platelets: 249 10*3/uL (ref 150–400)
RBC: 3.55 MIL/uL — ABNORMAL LOW (ref 3.87–5.11)
RDW: 17 % — ABNORMAL HIGH (ref 11.5–15.5)
WBC: 4.7 10*3/uL (ref 4.0–10.5)
nRBC: 0 % (ref 0.0–0.2)

## 2022-05-10 LAB — CULTURE, BLOOD (ROUTINE X 2): Special Requests: ADEQUATE

## 2022-05-10 MED ORDER — CARVEDILOL 3.125 MG PO TABS: 3.1250 mg | ORAL_TABLET | Freq: Two times a day (BID) | ORAL | 4 refills | Status: DC

## 2022-05-10 MED ORDER — PANTOPRAZOLE SODIUM 40 MG PO TBEC: 40.0000 mg | DELAYED_RELEASE_TABLET | Freq: Two times a day (BID) | ORAL | 5 refills | Status: DC

## 2022-05-10 MED ORDER — SUCRALFATE 1 GM/10ML PO SUSP: 1.0000 g | Freq: Three times a day (TID) | ORAL | 1 refills | Status: DC

## 2022-05-10 MED ORDER — ACETAMINOPHEN 325 MG PO TABS: 650.0000 mg | ORAL_TABLET | Freq: Four times a day (QID) | ORAL | 1 refills | Status: AC | PRN

## 2022-05-10 MED ORDER — FUROSEMIDE 20 MG PO TABS: 20.0000 mg | ORAL_TABLET | Freq: Every morning | ORAL | 2 refills | Status: DC

## 2022-05-10 MED ORDER — SENNOSIDES-DOCUSATE SODIUM 8.6-50 MG PO TABS: 2.0000 | ORAL_TABLET | Freq: Every day | ORAL | 3 refills | Status: AC

## 2022-05-10 MED ORDER — POTASSIUM CHLORIDE ER 10 MEQ PO TBCR: 10.0000 meq | EXTENDED_RELEASE_TABLET | Freq: Every day | ORAL | 2 refills | Status: AC

## 2022-05-10 MED ORDER — ASPIRIN EC 81 MG PO TBEC: 81.0000 mg | DELAYED_RELEASE_TABLET | Freq: Every day | ORAL | 11 refills | Status: AC

## 2022-05-10 MED ORDER — CEPHALEXIN 500 MG PO CAPS: 500.0000 mg | ORAL_CAPSULE | Freq: Two times a day (BID) | ORAL | 0 refills | Status: AC

## 2022-05-10 MED ORDER — BISACODYL 10 MG RE SUPP
10.0000 mg | Freq: Once | RECTAL | Status: DC
  Filled 2022-05-10: qty 1

## 2022-05-10 MED ORDER — ONDANSETRON HCL 4 MG PO TABS: 4.0000 mg | ORAL_TABLET | Freq: Four times a day (QID) | ORAL | 2 refills | Status: AC | PRN

## 2022-05-10 NOTE — Anesthesia Postprocedure Evaluation (Signed)
Anesthesia Post Note  Patient: EMMALYN COWDIN  Procedure(s) Performed: ESOPHAGOGASTRODUODENOSCOPY (EGD) WITH PROPOFOL MALONEY DILATION BIOPSY  Patient location during evaluation: Phase II Anesthesia Type: General Level of consciousness: awake Pain management: pain level controlled Vital Signs Assessment: post-procedure vital signs reviewed and stable Respiratory status: spontaneous breathing and respiratory function stable Cardiovascular status: blood pressure returned to baseline and stable Postop Assessment: no headache and no apparent nausea or vomiting Anesthetic complications: no Comments: Late entry   No notable events documented.   Last Vitals:  Vitals:   05/09/22 2051 05/10/22 0501  BP: 130/68 136/69  Pulse: 74 78  Resp: 20 19  Temp: 37.1 C 37.1 C  SpO2: 100% 100%    Last Pain:  Vitals:   05/10/22 0501  TempSrc: Oral  PainSc:                  Windell Norfolk

## 2022-05-10 NOTE — Discharge Instructions (Signed)
1)Repeat CBC and BMP Blood Test on Friday 05/15/22 2)Follow-up with Gastroenterologist Dr. Earnest Bailey with [facility]--- in 3 to 4 weeks  for evaluation  -address: [address] [place], [place] [address], Phone: [phone]

## 2022-05-10 NOTE — Progress Notes (Signed)
Repositioned in bed with head up and feet elevated.  Bed then returned to low position.

## 2022-05-10 NOTE — Discharge Summary (Signed)
[name], is a 82 y.o. female  DOB Oct 06, 1940  MRN [id number].  Admission date:  05/05/2022  Admitting Physician  Onnie Boer, MD  Discharge Date:  05/10/2022   Primary MD  [name], MD  Recommendations for primary care physician for things to follow:  1)Repeat CBC and BMP Blood Test on Friday 05/15/22 2)Follow-up with Gastroenterologist Dr. Earnest Bailey with [facility]--- in 3 to 4 weeks  for evaluation  -address: [address] [place], [place] [address], Phone: [phone]  Admission Diagnosis  Pyelonephritis [N12] Sepsis (HCC) [A41.9] Sepsis, due to unspecified organism, unspecified whether acute organ dysfunction present [facility]) [A41.9]   Discharge Diagnosis  Pyelonephritis [N12] Sepsis (HCC) [A41.9] Sepsis, due to unspecified organism, unspecified whether acute organ dysfunction present (HCC) [A41.9]    Principal Problem:   Hypotension Active Problems:   UTI (urinary tract infection)   Diabetes mellitus with stage 3 chronic kidney disease (HCC)   Esophageal dysphagia   Dementia (HCC)   CKD (chronic kidney disease) stage 3, GFR 30-59 ml/min (HCC)   Hiatal hernia   Multiple gastric ulcers      Past Medical History:  Diagnosis Date   CKD (chronic kidney disease)    Dementia (HCC)    Diabetes mellitus    Diastolic heart failure (HCC)    GERD (gastroesophageal reflux disease)    Hypertension    Parkinson disease    Scoliosis    Scoliosis    UTI (lower urinary tract infection)     Past Surgical History:  Procedure Laterality Date   BALLOON DILATION N/A 02/21/2021   Procedure: BALLOON DILATION;  Surgeon: Lanelle Bal, DO;  [facility];  Service: Endoscopy;  Laterality: N/A;   BIOPSY  02/21/2021   Procedure: BIOPSY;  Surgeon: Lanelle Bal, DO;  [facility];  Service: Endoscopy;;   boil Left    boil removed from  left cheek   ESOPHAGOGASTRODUODENOSCOPY (EGD) WITH PROPOFOL N/A 02/21/2021   Procedure: ESOPHAGOGASTRODUODENOSCOPY (EGD) WITH PROPOFOL;  Surgeon: Lanelle Bal, DO;  [facility];  Service: Endoscopy;  Laterality: N/A;  11:00am   EYE SURGERY     Retina Detachment   MASS EXCISION N/A 06/27/2014   Procedure: EXCISION SUBMANDIBULAR STONE;  Surgeon: Serena Colonel, MD;  Location: [facility] OR;  Service: ENT;  Laterality: N/A;   sublingual gland removal  2016   tumor removed from rib      HPI  from the history and physical done on the day of admission:   Chief Complaint: Hypotension, Vomiting, diarrhea   HPI: [name] is a 82 y.o. female with medical history significant for diabetes mellitus, hypertension, dementia, CKD 4.  Patient was sent to the ED from GI office with reports of hypotension and tachycardia. At the time of my evaluation, patient is awake alert oriented and able to answer questions, her daughter [name] is at bedside.  They report over the past month, patient has been getting progressively weak, poor oral intake, nausea and  vomiting.  Over the past 4 days she has been having diarrhea 2-4 times daily.  No abdominal pain.  She reports chronic back pain secondary to scoliosis that is unchanged.  No fevers no chills.  No difficulty breathing no cough no chest pain.   ED Course: Temperature 97.6 heart rate 60s to 100.  Respirate rate 14- 18.  Blood pressure systolic down to 16/10 improved with IV fluids to 120s systolic. WBC 8.6.  Lactic acidosis 3.7 > 3.8.   Urine described as thick cloudy urine, with UA suggestive of UTI.  Chest x-ray shows trace left pleural effusion and left lung base atelectasis but pneumonia not excluded. 1 L bolus given. Urine cultures obtained. IV ceftriaxone given.  Hospitalist admit for UTI, hypotension.   Review of Systems: As per HPI all other systems reviewed and negative    Hospital Course:    Brief Narrative:   82 y.o. female with medical  history significant for DM2, HTN, dementia, esophageal dysphagia, CKD IV, admitted on 05/04/2020 for with UTI, dehydration and hypotension in the setting of poor oral intake as a result of dysphagia with recurrent emesis/regurgitation     -Assessment and Plan: 1)Esophageal dysphagia with recurrent emesis and regurgitation- -Speech pathologist consult appreciated on 05/06/2022 -GI consult appreciated on 05/06/2022 S/p  EGD with dilatation on 05/07/2022 given dehydration/hypotension due to poor oral intake/regurgitation with recurrent emesis -EGD on 05/07/22 shows--- Moderate Schatzki ring. -Dilated, Large hiatal hernia, 2 large ulcers in the cardia -biopsied, Inflamed gastric mucosa status -status post biopsy -Hgb stable around 10 -dc home on twice daily Protonix and Carafate AC meals -Tolerated soft diet without emesis or regurgitation    2)Hypotension -With lactic acidosis in the setting of dehydration and poor oral intake -Clinically hypotension and lactic acidosis most likely due to dehydration and poor oral intake rather than sepsis -- Ok to resume carvedilol 3.125, and Lasix 20 mg daily -BP improved   3) E. coli UTI - --Urine culture from 05/05/2022 with pansensitive E. Coli -Treated with IV Rocephin , okay to discharge on p.o. Keflex   4)CKD stage -IV   renal function is currently close to recent baseline -Creatinine improved to 1.44 from 2.17 on admission -Resume calcitriol   5)DM2-A1c is 8.3 reflecting uncontrolled DM with hyperglycemia PTA -Resume PTA regimen   6)Dementia--- continue supportive care, patient lives with her daughter   7)Chronic back pain from scoliosis unchanged----continue as needed Tylenol -Hospital Bed DME requested   8)Hypokalemia--- replaced and normalized   Disposition: The patient is from: Home              Anticipated d/c is to: Home  Discharge Condition: stable  Follow UP   Follow-up Information     Lanelle Bal, DO. Call in 3 week(s).    Specialty: Gastroenterology Contact information: [address] [place] [address] [phone]                 Consults obtained -GI  Diet and Activity recommendation:  As advised  Discharge Instructions    Discharge Instructions     Call MD for:  difficulty breathing, headache or visual disturbances   Complete by: As directed    Call MD for:  persistant dizziness or light-headedness   Complete by: As directed    Call MD for:  persistant nausea and vomiting   Complete by: As directed    Call MD for:  severe uncontrolled pain   Complete by: As directed    Call MD for:  temperature >100.4   Complete by: As directed    Diet - low sodium heart healthy   Complete by: As directed    Mechanically soft diet----okay to use lactose-free supplements like Ensure, boost   Discharge instructions   Complete by: As directed    1)Repeat CBC and BMP Blood Test on Friday 05/15/22 2)Follow-up with Gastroenterologist Dr. Earnest Bailey with [facility]--- in 3 to 4 weeks  for evaluation  -address: [address] [place], [place] [address], Phone: [phone]   Increase activity slowly   Complete by: As directed        Discharge Medications     Allergies as of 05/10/2022   No Known Allergies      Medication List     STOP taking these medications    ketoconazole 2 % cream Commonly known as: NIZORAL   omeprazole 20 MG capsule Commonly known as: PRILOSEC       TAKE these medications    acetaminophen 325 MG tablet Commonly known as: TYLENOL Take 2 tablets (650 mg total) by mouth every 6 (six) hours as needed for mild pain (or Fever >/= 101).   aspirin EC 81 MG tablet Take 1 tablet (81 mg total) by mouth daily with breakfast. What changed: when to take this   calcitRIOL 0.25 MCG capsule Commonly known as: ROCALTROL Take 0.25 mcg by mouth every other day.   carvedilol 3.125 MG tablet Commonly known as: COREG Take 1 tablet (3.125 mg  total) by mouth 2 (two) times daily.   cephALEXin 500 MG capsule Commonly known as: KEFLEX Take 1 capsule (500 mg total) by mouth 2 (two) times daily for 2 days.   cholecalciferol 25 MCG (1000 UNIT) tablet Commonly known as: VITAMIN D3 Take 1,000 Units by mouth daily.   furosemide 20 MG tablet Commonly known as: LASIX Take 1 tablet (20 mg total) by mouth every morning. What changed:  when to take this additional instructions   HumaLOG Mix 50/50 KwikPen (50-50) 100 UNIT/ML Kwikpen Generic drug: Insulin Lispro Prot & Lispro Take 20-40 Units by mouth 2 (two) times daily before a meal. 40units in the morning and 20 units in the evening   HYDROcodone-acetaminophen 5-325 MG tablet Commonly known as: NORCO/VICODIN Take 1 tablet by mouth every 4 (four) hours as needed.   loperamide 2 MG capsule Commonly known as: IMODIUM Take 1 capsule (2 mg total) by mouth 3 (three) times daily as needed for diarrhea or loose stools. What changed: how much to take   ondansetron 4 MG tablet Commonly known as: ZOFRAN Take 1 tablet (4 mg total) by mouth every 6 (six) hours as needed for nausea. What changed:  when to take this reasons to take this   pantoprazole 40 MG tablet Commonly known as: PROTONIX Take 1 tablet (40 mg total) by mouth 2 (two) times daily.   potassium chloride 10 MEQ tablet Commonly known as: KLOR-CON Take 1 tablet (10 mEq total) by mouth daily. Take While taking Lasix/furosemide What changed:  medication strength how much to take additional instructions   risedronate 150 MG tablet Commonly known as: ACTONEL Take 150 mg by mouth every 30 (thirty) days. with water on empty stomach, nothing by mouth or lie down for next 30 minutes.   senna-docusate 8.6-50 MG tablet Commonly known as: Senokot-S Take 2 tablets by mouth at bedtime.   simvastatin 5 MG tablet Commonly known as: ZOCOR Take 5 mg by mouth at bedtime.   sucralfate 1 GM/10ML suspension Commonly known  as:  CARAFATE Take 10 mLs (1 g total) by mouth 4 (four) times daily -  with meals and at bedtime.       Major procedures and Radiology Reports - PLEASE review detailed and final reports for all details, in brief -   DG Chest Port 1 View  Result Date: 05/05/2022 CLINICAL DATA:  Questionable sepsis. EXAM: PORTABLE CHEST 1 VIEW COMPARISON:  Chest radiograph dated 10/12/2014. CT abdomen pelvis dated 04/17/2022. FINDINGS: Trace left pleural effusion and left lung base atelectasis. Pneumonia is not excluded the right lung is clear. No pneumothorax. The cardiac silhouette is within normal limits. Atherosclerotic calcification of the aorta. Moderate size hiatal hernia. No acute osseous pathology. IMPRESSION: Trace left pleural effusion and left lung base atelectasis. Pneumonia is not excluded. Electronically Signed   By: Elgie Collard M.D.   On: 05/05/2022 16:10   CT ABDOMEN PELVIS WO CONTRAST  Result Date: 04/17/2022 CLINICAL DATA:  Abdominal pain EXAM: CT ABDOMEN AND PELVIS WITHOUT CONTRAST TECHNIQUE: Multidetector CT imaging of the abdomen and pelvis was performed following the standard protocol without IV contrast. RADIATION DOSE REDUCTION: This exam was performed according to the departmental dose-optimization program which includes automated exposure control, adjustment of the mA and/or kV according to patient size and/or use of iterative reconstruction technique. COMPARISON:  CT abdomen pelvis, 02/16/2022 MR abdomen, 08/27/2021, pelvic ultrasound, 02/17/2022 FINDINGS: Lower chest: No acute abnormality. 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 Coronary artery calcifications. Trace bilateral pleural effusions and associated atelectasis or consolidation. Hepatobiliary: No solid liver abnormality is seen. No gallstones. Unchanged intra and extrahepatic biliary ductal dilatation. Pancreas: Unremarkable. No pancreatic ductal dilatation or surrounding inflammatory changes. Spleen:  Normal in size without significant abnormality. Adrenals/Urinary Tract: Adrenal glands are unremarkable. Kidneys are normal, without renal calculi, solid lesion, or hydronephrosis. Bladder is unremarkable. Stomach/Bowel: Stomach is within normal limits. Descending duodenal diverticulum. Appendix appears normal. No evidence of bowel wall thickening, distention, or inflammatory changes. Descending and sigmoid diverticulosis. Vascular/Lymphatic: Aortic atherosclerosis. No enlarged abdominal or pelvic lymph nodes. Reproductive: No mass mass. Unchanged left ovarian or adnexal cyst measuring 7.3 x 5.7 cm (series 2, 63). Other: No abdominal wall hernia. Anasarca. No ascites. 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 Musculoskeletal: No acute or significant osseous findings. IMPRESSION: 1. No acute noncontrast CT findings of the abdomen or pelvis to explain abdominal pain. 2. Descending and sigmoid diverticulosis without evidence of acute diverticulitis. 3. 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 4. 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 This is of uncertain nature, and previously characterized by dedicated MR. 5. Unchanged intra and extrahepatic biliary ductal dilatation, which may be secondary to mass effect from retroperitoneal mass described above. Correlate for biochemical evidence of biliary obstruction. 6. Unchanged left ovarian or adnexal cyst measuring 7.3 x 5.7 cm, previously assessed by dedicated pelvic ultrasound. 7. Trace bilateral pleural effusions and associated atelectasis or consolidation. 8. Anasarca. 9. Coronary artery disease. Aortic Atherosclerosis (ICD10-I70.0). Electronically Signed   By: Jearld Lesch M.D.   On:  04/17/2022 15:41    Micro Results   Recent Results (from the past 240 hour(s))  Blood Culture (routine x 2)     Status: None   Collection Time: 05/05/22  4:17 PM   Specimen: BLOOD LEFT HAND  Result Value Ref Range Status   Specimen Description BLOOD LEFT HAND  Final   Special Requests   Final    BOTTLES DRAWN AEROBIC ONLY Blood Culture adequate volume   Culture   Final    NO GROWTH 5 DAYS Performed at [facility], [address] [place], [place] [address]    Report Status 05/10/2022 FINAL  Final  Urine Culture (for pregnant, neutropenic or urologic patients or patients with an indwelling urinary catheter)     Status: Abnormal   Collection Time: 05/05/22  4:30 PM   Specimen: Urine, Catheterized  Result Value Ref Range Status   Specimen Description   Final    URINE, CATHETERIZED Performed at [facility], [address], [place], [place] [address]    Special Requests   Final    NONE Performed at [facility], [address] [place], [place] [address]    Culture >=100,000 COLONIES/mL ESCHERICHIA COLI (A)  Final   Report Status 05/07/2022 FINAL  Final   Organism ID, Bacteria ESCHERICHIA COLI (A)  Final      Susceptibility   Escherichia coli - MIC*    AMPICILLIN <=2 SENSITIVE Sensitive     CEFAZOLIN <=4 SENSITIVE Sensitive     CEFEPIME <=0.12 SENSITIVE Sensitive     CEFTRIAXONE <=0.25 SENSITIVE Sensitive     CIPROFLOXACIN >=4 RESISTANT Resistant     GENTAMICIN <=1 SENSITIVE Sensitive     IMIPENEM <=0.25 SENSITIVE Sensitive     NITROFURANTOIN <=16 SENSITIVE Sensitive     TRIMETH/SULFA <=20 SENSITIVE Sensitive     AMPICILLIN/SULBACTAM <=2 SENSITIVE Sensitive     PIP/TAZO <=4 SENSITIVE Sensitive     * >=100,000 COLONIES/mL ESCHERICHIA COLI  Blood Culture (routine x 2)     Status: None   Collection Time: 05/05/22  4:47 PM   Specimen: BLOOD  Result Value Ref Range Status   Specimen Description BLOOD BLOOD LEFT HAND  Final   Special Requests   Final    BOTTLES DRAWN AEROBIC  ONLY Blood Culture adequate volume   Culture   Final    NO GROWTH 5 DAYS Performed at [facility], [address], [place], [place] [address]    Report Status 05/10/2022 FINAL  Final   Today   Subjective   [name] today has been tolerating soft diet without emesis or regurgitation today No further emesis Had BM today  Daughter is at bedside No fever  Or chills    Patient has been seen and examined prior to discharge   Objective   Blood pressure 136/69, pulse 78, temperature 98.8 F (37.1 C), temperature source Oral, resp. rate 19, height 5\' 5"  (1.651 m), weight 70.3 kg, SpO2 100 %.   Intake/Output Summary (Last 24 hours) at 05/10/2022 1354 Last data filed at 05/10/2022 0900 Gross per 24 hour  Intake 240 ml  Output 650 ml  Net -410 ml    Exam Gen:- Awake Alert,  in no apparent distress  HEENT:- [place].AT, No sclera icterus Eyes--poor vision/visual loss Neck-Supple Neck,No JVD,.  Lungs-  CTAB , fair symmetrical air movement CV- S1, S2 normal, regular  Abd-  +ve B.Sounds, Abd Soft, nontender, no CVA area tenderness Extremity/Skin:-   pedal pulses present  Psych-affect is appropriate, oriented x3 Neuro-generalized weakness, no new focal deficits, no tremors   Data Review   CBC w Diff:  Lab Results  Component Value Date   WBC 4.7 05/10/2022   HGB 10.0 (L) 05/10/2022   HGB 13.1 02/02/2022   HCT 29.0 (  L) 05/10/2022   HCT 41.6 02/02/2022   PLT 249 05/10/2022   PLT 330 02/02/2022   LYMPHOPCT 12 05/05/2022   MONOPCT 7 05/05/2022   EOSPCT 0 05/05/2022   BASOPCT 0 05/05/2022    CMP:  Lab Results  Component Value Date   NA 135 05/09/2022   NA 131 (L) 02/10/2022   K 4.0 05/09/2022   CL 109 05/09/2022   CO2 22 05/09/2022   BUN 18 05/09/2022   BUN 18 02/10/2022   CREATININE 1.44 (H) 05/09/2022   PROT 6.4 (L) 05/05/2022   PROT 6.4 02/02/2022   ALBUMIN <1.5 (L) 05/08/2022   ALBUMIN 2.6 (L) 02/02/2022   BILITOT 1.0 05/05/2022   BILITOT 0.6 02/02/2022   ALKPHOS  155 (H) 05/05/2022   AST 25 05/05/2022   ALT 14 05/05/2022  . Total Discharge time is about 33 minutes  Shon Hale M.D on 05/10/2022 at 1:54 PM  Go to [email or web address] -  for contact info  [facility] - Office  [phone]

## 2022-05-10 NOTE — Progress Notes (Signed)
Patient did finally rest well after midnight.  Patient did have c/o of chronic back pain and was medicated per MAR.  Vitals stable no new issues at this time.  Daughter at bedside entire shift.

## 2022-05-10 NOTE — Progress Notes (Signed)
Poor intake of dys 3 diet today.  Had large bm without suppository.  IV removed and to follow up with gastro in three weeks   Scripts sent to pharmacy.

## 2022-05-10 NOTE — TOC Progression Note (Signed)
Transition of Care [facility]) - Progression Note    Patient Details  Name: [name] MRN: [id number] Date of Birth: [birth year]  Transition of [facility] CM/SW Contact  Catalina Gravel, LCSW Phone Number: 05/10/2022, 2:15 PM  Clinical Narrative:    CSW contacted daughter as pt DC today. MD ordered hospital bed to assist in pt home care.  Daughter shared there is an aid in place through [place] 5 days a week, about 4 hours a day. Family is waiting for CAPS program to maybe assist with more care hours at home. They are working with PCP on the application. Also daughter is waiting to hear from palliative care.   DC today no further DC needs.   Expected Discharge Plan: Home/Self Care Barriers to Discharge: No Barriers Identified  Expected Discharge Plan and Services         Expected Discharge Date: 05/10/22                                     Social Determinants of Health (SDOH) Interventions SDOH Screenings   Food Insecurity: No Food Insecurity (05/05/2022)  Housing: Low Risk  (05/05/2022)  Transportation Needs: No Transportation Needs (05/05/2022)  Utilities: Not At Risk (05/05/2022)  Alcohol Screen: Low Risk  (02/10/2022)  Depression (PHQ2-9): High Risk (02/10/2022)  Financial Resource Strain: Low Risk  (02/10/2022)  Physical Activity: Inactive (02/10/2022)  Social Connections: Socially Isolated (02/10/2022)  Stress: No Stress Concern Present (02/10/2022)  Tobacco Use: Low Risk  (05/07/2022)    Readmission Risk Interventions     No data to display

## 2022-05-12 ENCOUNTER — Encounter: Payer: Self-pay | Admitting: Internal Medicine

## 2022-05-12 LAB — SURGICAL PATHOLOGY

## 2022-05-13 ENCOUNTER — Encounter (HOSPITAL_COMMUNITY): Payer: Self-pay | Admitting: Internal Medicine

## 2022-05-29 ENCOUNTER — Other Ambulatory Visit: Payer: Self-pay

## 2022-05-29 ENCOUNTER — Emergency Department (HOSPITAL_COMMUNITY): Payer: 59

## 2022-05-29 ENCOUNTER — Encounter (HOSPITAL_COMMUNITY): Payer: Self-pay | Admitting: Emergency Medicine

## 2022-05-29 ENCOUNTER — Emergency Department (HOSPITAL_COMMUNITY)
Admission: EM | Admit: 2022-05-29 | Discharge: 2022-05-29 | Disposition: A | Discharge status: Home / Self Care | Payer: 59 | Attending: Emergency Medicine | Admitting: Emergency Medicine

## 2022-05-29 DIAGNOSIS — Z1152 Encounter for screening for COVID-19: Secondary | ICD-10-CM | POA: Insufficient documentation

## 2022-05-29 DIAGNOSIS — E1122 Type 2 diabetes mellitus with diabetic chronic kidney disease: Secondary | ICD-10-CM | POA: Insufficient documentation

## 2022-05-29 DIAGNOSIS — Z7982 Long term (current) use of aspirin: Secondary | ICD-10-CM | POA: Insufficient documentation

## 2022-05-29 DIAGNOSIS — I129 Hypertensive chronic kidney disease with stage 1 through stage 4 chronic kidney disease, or unspecified chronic kidney disease: Secondary | ICD-10-CM | POA: Insufficient documentation

## 2022-05-29 DIAGNOSIS — Z79899 Other long term (current) drug therapy: Secondary | ICD-10-CM | POA: Diagnosis not present

## 2022-05-29 DIAGNOSIS — N183 Chronic kidney disease, stage 3 unspecified: Secondary | ICD-10-CM | POA: Insufficient documentation

## 2022-05-29 DIAGNOSIS — R0602 Shortness of breath: Secondary | ICD-10-CM | POA: Insufficient documentation

## 2022-05-29 DIAGNOSIS — R051 Acute cough: Secondary | ICD-10-CM

## 2022-05-29 LAB — COMPREHENSIVE METABOLIC PANEL
ALT: 14 U/L (ref 0–44)
AST: 20 U/L (ref 15–41)
Albumin: 1.6 g/dL — ABNORMAL LOW (ref 3.5–5.0)
Alkaline Phosphatase: 107 U/L (ref 38–126)
Anion gap: 10 (ref 5–15)
BUN: 5 mg/dL — ABNORMAL LOW (ref 8–23)
CO2: 20 mmol/L — ABNORMAL LOW (ref 22–32)
Calcium: 7.9 mg/dL — ABNORMAL LOW (ref 8.9–10.3)
Chloride: 103 mmol/L (ref 98–111)
Creatinine, Ser: 1.12 mg/dL — ABNORMAL HIGH (ref 0.44–1.00)
GFR, Estimated: 49 mL/min — ABNORMAL LOW (ref >60)
Glucose, Bld: 130 mg/dL — ABNORMAL HIGH (ref 70–99)
Potassium: 3.5 mmol/L (ref 3.5–5.1)
Sodium: 133 mmol/L — ABNORMAL LOW (ref 135–145)
Total Bilirubin: 0.8 mg/dL (ref 0.3–1.2)
Total Protein: 5.6 g/dL — ABNORMAL LOW (ref 6.5–8.1)

## 2022-05-29 LAB — CBC
HCT: 33.8 % — ABNORMAL LOW (ref 36.0–46.0)
Hemoglobin: 11.7 g/dL — ABNORMAL LOW (ref 12.0–15.0)
MCH: 28.2 pg (ref 26.0–34.0)
MCHC: 34.6 g/dL (ref 30.0–36.0)
MCV: 81.4 fL (ref 80.0–100.0)
Platelets: 319 10*3/uL (ref 150–400)
RBC: 4.15 MIL/uL (ref 3.87–5.11)
RDW: 16.1 % — ABNORMAL HIGH (ref 11.5–15.5)
WBC: 4.9 10*3/uL (ref 4.0–10.5)
nRBC: 0 % (ref 0.0–0.2)

## 2022-05-29 LAB — SARS CORONAVIRUS 2 BY RT PCR: SARS Coronavirus 2 by RT PCR: NEGATIVE

## 2022-05-29 MED ORDER — PREDNISONE 10 MG PO TABS: 20.0000 mg | ORAL_TABLET | Freq: Every day | ORAL | 0 refills | Status: DC

## 2022-05-29 NOTE — ED Notes (Signed)
Pt is a hoyer lift at home and family requested staff to just pick up pt. No hoyer pad with pt.

## 2022-05-29 NOTE — ED Triage Notes (Signed)
Pt via POV with health aide reporting that pt has had a productive cough x 1-2 weeks. She went to PCP last week and was told to come to ER if symptoms did not improve; instead, cough has worsened. Phlegm is yellow/white tenacious per aide. Pt reports severe pack pain in triage. Lung sounds clear/diminished.

## 2022-05-29 NOTE — ED Provider Notes (Signed)
[facility] Provider Note   CSN: [id number] Arrival date & time: 05/29/22  1225     History  Chief Complaint  Patient presents with   Cough    [name] is a 82 y.o. female history of hypertension, diabetes, CKD stage III, pyelonephritis, hiatal hernia presented with productive cough over the past week.  Patient was recently discharged at the end of April for pyelonephritis.  Patient has been having clear sputum for the past week when she coughs.  Patient is been seen by her primary care provider and given Tessalon however this has not been helping.  Patient states she does not feel short of breath or have chest pain or have any leg swelling or estrogen use.  Patient denies history of cancer, blood clots.  Patient is not on oxygen at baseline and is not requiring oxygen at this time.    Home Medications Prior to Admission medications   Medication Sig Start Date End Date Taking? Authorizing Provider  acetaminophen (TYLENOL) 325 MG tablet Take 2 tablets (650 mg total) by mouth every 6 (six) hours as needed for mild pain (or Fever >/= 101). 05/10/22  Yes Emokpae, Courage, MD  acetaminophen-codeine (TYLENOL #3) 300-30 MG tablet Take 1 tablet by mouth 2 (two) times daily. 05/13/22  Yes [provider]  aspirin EC 81 MG tablet Take 1 tablet (81 mg total) by mouth daily with breakfast. 05/10/22  Yes Emokpae, Courage, MD  benzonatate (TESSALON) 100 MG capsule Take 100 mg by mouth every 6 (six) hours as needed. 05/22/22  Yes [provider]  calcitRIOL (ROCALTROL) 0.25 MCG capsule Take 0.25 mcg by mouth every other day.   Yes [provider]  carvedilol (COREG) 3.125 MG tablet Take 1 tablet (3.125 mg total) by mouth 2 (two) times daily. 05/10/22  Yes Emokpae, Courage, MD  cholecalciferol (VITAMIN D3) 25 MCG (1000 UNIT) tablet Take 1,000 Units by mouth daily.   Yes [provider]  furosemide (LASIX) 20 MG tablet Take 1  tablet (20 mg total) by mouth every morning. 05/10/22  Yes Emokpae, Courage, MD  HUMALOG MIX 50/50 KWIKPEN (50-50) 100 UNIT/ML Kwikpen Take 20-40 Units by mouth 2 (two) times daily before a meal. 40units in the morning and 20 units in the evening 06/01/14  Yes [provider]  loperamide (IMODIUM) 2 MG capsule Take 1 capsule (2 mg total) by mouth 3 (three) times daily as needed for diarrhea or loose stools. Patient taking differently: Take 2-4 mg by mouth 3 (three) times daily as needed for diarrhea or loose stools. 02/02/22  Yes Tiffany Kocher, PA-C  omeprazole (PRILOSEC) 20 MG capsule Take by mouth 2 (two) times daily. 05/25/22  Yes [provider]  ondansetron (ZOFRAN) 4 MG tablet Take 1 tablet (4 mg total) by mouth every 6 (six) hours as needed for nausea. 05/10/22  Yes Emokpae, Courage, MD  pantoprazole (PROTONIX) 40 MG tablet Take 1 tablet (40 mg total) by mouth 2 (two) times daily. 05/10/22  Yes Emokpae, Courage, MD  potassium chloride (KLOR-CON) 10 MEQ tablet Take 1 tablet (10 mEq total) by mouth daily. Take While taking Lasix/furosemide Patient taking differently: Take 20 mEq by mouth daily. Take While taking Lasix/furosemide 05/10/22  Yes Emokpae, Courage, MD  predniSONE (DELTASONE) 10 MG tablet Take 2 tablets (20 mg total) by mouth daily. 05/29/22  Yes Bertina Guthridge, Beverly Gust, PA-C  senna-docusate (SENOKOT-S) 8.6-50 MG tablet Take 2 tablets by mouth at bedtime. 05/10/22  Yes Emokpae, Courage, MD  simvastatin (ZOCOR) 5 MG tablet Take 5 mg by mouth at bedtime. 06/01/14  Yes [provider]  HYDROcodone-acetaminophen (NORCO/VICODIN) 5-325 MG tablet Take 1 tablet by mouth every 4 (four) hours as needed. Patient not taking: Reported on 05/29/2022 04/17/22   Jacalyn Lefevre, MD  risedronate (ACTONEL) 150 MG tablet Take 150 mg by mouth every 30 (thirty) days. with water on empty stomach, nothing by mouth or lie down for next 30 minutes.    [provider]  sucralfate (CARAFATE) 1  GM/10ML suspension Take 10 mLs (1 g total) by mouth 4 (four) times daily -  with meals and at bedtime. Patient not taking: Reported on 05/29/2022 05/10/22   Shon Hale, MD      Allergies    Patient has no known allergies.    Review of Systems   Review of Systems  Respiratory:  Positive for cough.     Physical Exam Updated Vital Signs BP 133/80   Pulse 90   Temp 98.9 F (37.2 C) (Oral)   Resp 17   Ht 5\' 5"  (1.651 m)   Wt 70.3 kg   SpO2 98%   BMI 25.79 kg/m  Physical Exam Constitutional:      General: She is not in acute distress. Cardiovascular:     Rate and Rhythm: Normal rate and regular rhythm.     Pulses: Normal pulses.     Heart sounds: Normal heart sounds.  Pulmonary:     Effort: Pulmonary effort is normal. No respiratory distress.     Breath sounds: Normal breath sounds.  Abdominal:     Palpations: Abdomen is soft.     Tenderness: There is no abdominal tenderness. There is no guarding or rebound.  Musculoskeletal:        General: Normal range of motion.     Cervical back: Normal range of motion.  Skin:    General: Skin is warm and dry.  Neurological:     Mental Status: She is alert.     ED Results / Procedures / Treatments   Labs (all labs ordered are listed, but only abnormal results are displayed) Labs Reviewed  CBC - Abnormal; Notable for the following components:      Result Value   Hemoglobin 11.7 (*)    HCT 33.8 (*)    RDW 16.1 (*)    All other components within normal limits  COMPREHENSIVE METABOLIC PANEL - Abnormal; Notable for the following components:   Sodium 133 (*)    CO2 20 (*)    Glucose, Bld 130 (*)    BUN 5 (*)    Creatinine, Ser 1.12 (*)    Calcium 7.9 (*)    Total Protein 5.6 (*)    Albumin 1.6 (*)    GFR, Estimated 49 (*)    All other components within normal limits  SARS CORONAVIRUS 2 BY RT PCR    EKG None  Radiology DG Chest 2 View  Result Date: 05/29/2022 CLINICAL DATA:  cough EXAM: CHEST - 2 VIEW COMPARISON:   CXR 05/05/22 FINDINGS: No pleural effusion, unchanged from prior exam. No pneumothorax. Unchanged appearance of the left costophrenic angle with persistent atelectasis. Slightly low lung volumes. No radiographically apparent displaced rib fractures. Normal cardiac and mediastinal contours. Slightly prominent bilateral interstitial opacities are nonspecific and favored to represent bronchovascular crowding in the setting of low lung volumes. Moderate-sized hiatal hernia. IMPRESSION: 1. Unchanged appearance of the left costophrenic angle with persistent atelectasis. 2. Moderate-sized hiatal hernia.  Electronically Signed   By: Lorenza Cambridge M.D.   On: 05/29/2022 13:04    Procedures Procedures    Medications Ordered in ED Medications - No data to display  ED Course/ Medical Decision Making/ A&P                             Medical Decision Making Amount and/or Complexity of Data Reviewed Labs: ordered. Radiology: ordered.   Pat Kocher 82 y.o. presented today for shortness of breath.  Working DDx that I considered at this time includes, but not limited to, asthma/COPD exacerbation, URI, viral illness, anemia, ACS, PE, pneumonia, pleural effusion, lung cancer.  R/o DDx: Asthma, COPD exacerbation, anemia, ACS, PE, pneumonia, pleural effusion, lung cancer: These are considered less likely due to history of present illness and physical exam findings  Review of prior external notes: 05/05/2022 discharge  Unique Tests and My Interpretation:  CBC: Unremarkable CMP: Unremarkable, similar to previous reading CXR: Atelectasis that has been persistent COVID: Negative  Discussion with Independent Historian:  Daughter  Discussion of Management of Tests: None  Risk: Medium: prescription drug management  Risk Stratification Score:  none  Staffed with Eloise Harman, MD  Plan: Patient presented for shortness of breath.  On exam patient was in no acute distress and stable vitals.  Patient physical  exam was unremarkable as her lungs were clear to auscultation and patient denied having any kind of chest pain or shortness of breath with me.  Patient daughter stated patient has not had any fevers or any systemic symptoms outside of coughing up clear sputum.  Patient is afebrile and not requiring oxygen.  Patient will be discharged with steroids as chest x-ray showed atelectasis. Encouraged patient follow-up with her primary care provider and to continue taking her Tessalon at home as well.  Patient was given return precautions. Patient stable for discharge at this time.  Patient verbalized understanding of plan.         Final Clinical Impression(s) / ED Diagnoses Final diagnoses:  Acute cough    Rx / DC Orders ED Discharge Orders          Ordered    predniSONE (DELTASONE) 10 MG tablet  Daily        05/29/22 1536              Remi Deter 05/29/22 1555    Rondel Baton, MD 06/03/22 0111

## 2022-05-29 NOTE — ED Notes (Signed)
Pt transported to xray 

## 2022-05-29 NOTE — Discharge Instructions (Signed)
Please follow-up with your primary provider regarding recent symptoms and ER visit.  Today your chest x-ray showed atelectasis but no pneumonia and your vitals and other labs were reassuring.  Please pick up the steroids I have prescribed for you and continue to take the Tessalon as prescribed.  If symptoms worsen please return to ER.

## 2022-06-06 NOTE — Progress Notes (Unsigned)
GI Office Note    Referring Provider: [name], MD Primary Care Physician:  [name], MD  Primary Gastroenterologist: Roetta Sessions, MD   Chief Complaint   No chief complaint on file.   History of Present Illness   [name] is a 82 y.o. female presenting today     Seen during recent admission last month. Presented with dizziness, N/V, found to have severe dehydration. EGD showed presence of moderate Schatzki's ring which was dilated and a large hiatal hernia with associated large gastric ulcers x 2. She has been kept on PPI twice daily and Carafate. Large hiatal hernia that is likely contributing to her dysphagia.  If she persists with dysphagia when advancing her diet despite dilation, there are very few options including surgery for hiatal hernia correction versus surgically placed feeding tube.    Patient with complex history. She has history of large hiatal hernia, esophageal dysmotility, dysphagia, GERD. She has history of abnormal CT A/P 05/2021 with enlarged CBD 1.5 cm, enlarged intrahepatic biliary ducts up to 6 mm, diffusely atrophic pancreas. Lobulated 2.4 X 2.7 cm right renal lesion. 6.8 X 5.4 cm left ovarian cystic lesion. 7.8 X 6.1 cm cystic lesion within upper quadrant abutting and compressing the IVC. Lesion abutted caudate lobe and CBD. Nodularity and calcification within lesion. Finding could represent choledochal cyst vs other benign/malignant lesions.   To further evaluate CT findings she had MRI abd with and without contrast completed 08/2021. The Right renal area of interest on CT felt to represent non-malignant process. The complex multiseptated cystic lesion within abdominal retroperitoneum is either adjacent to or exophytic off the right adrenal gland could be exophytic adrenal pheochromocytoma, retroperitoneal cystic teratoma, retroperitoneal complicated bronchogenic cyst or even chronic hematoma. Increase in Intra/extrahepatic biliary duct dilation.  Similar appearance of main pancreatic duct and side branch ectasia since 2012. Possibly chronic pancreatitis. LEFT OVARIAN LESION NOT WITHIN VIEW ON MRI ABDOMEN.   She has been seen by Dr. Fransico Him, last visit 01/28/2022, endocrine workup negative for functional adrenal mass. Labs ruled out Cushing's syndrome, pheochromocytoma. She has been evaluated by surgery, last seen by Dr. Sherlynn Stalls at [facility] 10/2021 and expectant management decided. Plans for 3 month follow up imaging. Per daughter, she needs to make follow up appt. She was apparently due for imaging and ov 01/12/22. She is scheduled to see Dr.Eure (gyn) for left ovarian cystic lesion. Saw Dr. Despina Hidden, left ovary cyst felt to be benign and stable over 10 years with no further work up planned.  Medications   Current Outpatient Medications  Medication Sig Dispense Refill   acetaminophen (TYLENOL) 325 MG tablet Take 2 tablets (650 mg total) by mouth every 6 (six) hours as needed for mild pain (or Fever >/= 101). 100 tablet 1   acetaminophen-codeine (TYLENOL #3) 300-30 MG tablet Take 1 tablet by mouth 2 (two) times daily.     aspirin EC 81 MG tablet Take 1 tablet (81 mg total) by mouth daily with breakfast. 30 tablet 11   benzonatate (TESSALON) 100 MG capsule Take 100 mg by mouth every 6 (six) hours as needed.     calcitRIOL (ROCALTROL) 0.25 MCG capsule Take 0.25 mcg by mouth every other day.     carvedilol (COREG) 3.125 MG tablet Take 1 tablet (3.125 mg total) by mouth 2 (two) times daily. 60 tablet 4   cholecalciferol (VITAMIN D3) 25 MCG (1000 UNIT) tablet Take 1,000 Units by mouth daily.     furosemide (LASIX) 20  MG tablet Take 1 tablet (20 mg total) by mouth every morning. 30 tablet 2   HUMALOG MIX 50/50 KWIKPEN (50-50) 100 UNIT/ML Kwikpen Take 20-40 Units by mouth 2 (two) times daily before a meal. 40units in the morning and 20 units in the evening     HYDROcodone-acetaminophen (NORCO/VICODIN) 5-325 MG tablet Take 1 tablet by mouth every 4 (four)  hours as needed. (Patient not taking: Reported on 05/29/2022) 20 tablet 0   loperamide (IMODIUM) 2 MG capsule Take 1 capsule (2 mg total) by mouth 3 (three) times daily as needed for diarrhea or loose stools. (Patient taking differently: Take 2-4 mg by mouth 3 (three) times daily as needed for diarrhea or loose stools.) 30 capsule 1   omeprazole (PRILOSEC) 20 MG capsule Take by mouth 2 (two) times daily.     ondansetron (ZOFRAN) 4 MG tablet Take 1 tablet (4 mg total) by mouth every 6 (six) hours as needed for nausea. 30 tablet 2   pantoprazole (PROTONIX) 40 MG tablet Take 1 tablet (40 mg total) by mouth 2 (two) times daily. 60 tablet 5   potassium chloride (KLOR-CON) 10 MEQ tablet Take 1 tablet (10 mEq total) by mouth daily. Take While taking Lasix/furosemide (Patient taking differently: Take 20 mEq by mouth daily. Take While taking Lasix/furosemide) 30 tablet 2   predniSONE (DELTASONE) 10 MG tablet Take 2 tablets (20 mg total) by mouth daily. 15 tablet 0   risedronate (ACTONEL) 150 MG tablet Take 150 mg by mouth every 30 (thirty) days. with water on empty stomach, nothing by mouth or lie down for next 30 minutes.     senna-docusate (SENOKOT-S) 8.6-50 MG tablet Take 2 tablets by mouth at bedtime. 60 tablet 3   simvastatin (ZOCOR) 5 MG tablet Take 5 mg by mouth at bedtime.     sucralfate (CARAFATE) 1 GM/10ML suspension Take 10 mLs (1 g total) by mouth 4 (four) times daily -  with meals and at bedtime. (Patient not taking: Reported on 05/29/2022) 420 mL 1   No current facility-administered medications for this visit.    Allergies   Allergies as of 06/08/2022   (No Known Allergies)     Past Medical History   Past Medical History:  Diagnosis Date   CKD (chronic kidney disease)    Dementia (HCC)    Diabetes mellitus    Diastolic heart failure (HCC)    GERD (gastroesophageal reflux disease)    Hypertension    Parkinson disease    Scoliosis    Scoliosis    UTI (lower urinary tract infection)      Past Surgical History   Past Surgical History:  Procedure Laterality Date   BALLOON DILATION N/A 02/21/2021   Procedure: BALLOON DILATION;  Surgeon: Lanelle Bal, DO;  Location: [facility];  Service: Endoscopy;  Laterality: N/A;   BIOPSY  02/21/2021   Procedure: BIOPSY;  Surgeon: Lanelle Bal, DO;  Location: [facility];  Service: Endoscopy;;   BIOPSY  05/07/2022   Procedure: BIOPSY;  Surgeon: Corbin Ade, MD;  Location: [facility];  Service: Endoscopy;;   boil Left    boil removed from left cheek   ESOPHAGOGASTRODUODENOSCOPY (EGD) WITH PROPOFOL N/A 02/21/2021   Procedure: ESOPHAGOGASTRODUODENOSCOPY (EGD) WITH PROPOFOL;  Surgeon: Lanelle Bal, DO;  Location: [facility];  Service: Endoscopy;  Laterality: N/A;  11:00am   ESOPHAGOGASTRODUODENOSCOPY (EGD) WITH PROPOFOL N/A 05/07/2022   Procedure: ESOPHAGOGASTRODUODENOSCOPY (EGD) WITH PROPOFOL;  Surgeon: Corbin Ade, MD;  Location:  [facility];  Service: Endoscopy;  Laterality: N/A;  +/- dilation   EYE SURGERY     Retina Detachment   MALONEY DILATION  05/07/2022   Procedure: MALONEY DILATION;  Surgeon: Corbin Ade, MD;  Location: [facility];  Service: Endoscopy;;   MASS EXCISION N/A 06/27/2014   Procedure: EXCISION SUBMANDIBULAR STONE;  Surgeon: Serena Colonel, MD;  Location: [facility] OR;  Service: ENT;  Laterality: N/A;   sublingual gland removal  2016   tumor removed from rib      Past Family History   Family History  Problem Relation Age of Onset   Colon cancer Maternal Grandmother        7   Kidney cancer Daughter 39   Ovarian cancer Daughter 88    Past Social History   Social History   Socioeconomic History   Marital status: Divorced    Spouse name: Not on file   Number of children: Not on file   Years of education: Not on file   Highest education level: Not on file  Occupational History   Not on file  Tobacco Use   Smoking status: Never   Smokeless tobacco: Never  Vaping Use    Vaping Use: Never used  Substance and Sexual Activity   Alcohol use: No    Alcohol/week: 0.0 standard drinks of alcohol   Drug use: No   Sexual activity: Not Currently    Birth control/protection: Post-menopausal, None  Other Topics Concern   Not on file  Social History Narrative   Not on file   Social Determinants of Health   Financial Resource Strain: Low Risk  (02/10/2022)   Overall Financial Resource Strain (CARDIA)    Difficulty of Paying Living Expenses: Not hard at all  Food Insecurity: No Food Insecurity (05/05/2022)   Hunger Vital Sign    Worried About Running Out of Food in the Last Year: Never true    Ran Out of Food in the Last Year: Never true  Transportation Needs: No Transportation Needs (05/05/2022)   PRAPARE - Administrator, Civil Service (Medical): No    Lack of Transportation (Non-Medical): No  Physical Activity: Inactive (02/10/2022)   Exercise Vital Sign    Days of Exercise per Week: 0 days    Minutes of Exercise per Session: 0 min  Stress: No Stress Concern Present (02/10/2022)   [employer] of Occupational Health - Occupational Stress Questionnaire    Feeling of Stress : Not at all  Social Connections: Socially Isolated (02/10/2022)   Social Connection and Isolation Panel [NHANES]    Frequency of Communication with Friends and Family: Three times a week    Frequency of Social Gatherings with Friends and Family: Twice a week    Attends Religious Services: Never    Database administrator or Organizations: No    Attends Banker Meetings: Never    Marital Status: Divorced  Catering manager Violence: Not At Risk (05/05/2022)   Humiliation, Afraid, Rape, and Kick questionnaire    Fear of Current or Ex-Partner: No    Emotionally Abused: No    Physically Abused: No    Sexually Abused: No    Review of Systems   General: Negative for anorexia, weight loss, fever, chills, fatigue, weakness. ENT: Negative for hoarseness, difficulty  swallowing , nasal congestion. CV: Negative for chest pain, angina, palpitations, dyspnea on exertion, peripheral edema.  Respiratory: Negative for dyspnea at rest, dyspnea on exertion, cough, sputum, wheezing.  GI:  See history of present illness. GU:  Negative for dysuria, hematuria, urinary incontinence, urinary frequency, nocturnal urination.  Endo: Negative for unusual weight change.     Physical Exam   There were no vitals taken for this visit.   General: Well-nourished, well-developed in no acute distress.  Eyes: No icterus. Mouth: Oropharyngeal mucosa moist and pink , no lesions erythema or exudate. Lungs: Clear to auscultation bilaterally.  Heart: Regular rate and rhythm, no murmurs rubs or gallops.  Abdomen: Bowel sounds are normal, nontender, nondistended, no hepatosplenomegaly or masses,  no abdominal bruits or hernia , no rebound or guarding.  Rectal: ***  Extremities: No lower extremity edema. No clubbing or deformities. Neuro: Alert and oriented x 4   Skin: Warm and dry, no jaundice.   Psych: Alert and cooperative, normal mood and affect.  Labs   Lab Results  Component Value Date   CREATININE 1.12 (H) 05/29/2022   BUN 5 (L) 05/29/2022   NA 133 (L) 05/29/2022   K 3.5 05/29/2022   CL 103 05/29/2022   CO2 20 (L) 05/29/2022   Lab Results  Component Value Date   ALT 14 05/29/2022   AST 20 05/29/2022   ALKPHOS 107 05/29/2022   BILITOT 0.8 05/29/2022   Lab Results  Component Value Date   WBC 4.9 05/29/2022   HGB 11.7 (L) 05/29/2022   HCT 33.8 (L) 05/29/2022   MCV 81.4 05/29/2022   PLT 319 05/29/2022   Lab Results  Component Value Date   LIPASE 23 04/17/2022   No results found for: "IRON", "TIBC", "FERRITIN" No results found for: "VITAMINB12" No results found for: "FOLATE"  Imaging Studies   DG Chest 2 View  Result Date: 05/29/2022 CLINICAL DATA:  cough EXAM: CHEST - 2 VIEW COMPARISON:  CXR 05/05/22 FINDINGS: No pleural effusion, unchanged from prior  exam. No pneumothorax. Unchanged appearance of the left costophrenic angle with persistent atelectasis. Slightly low lung volumes. No radiographically apparent displaced rib fractures. Normal cardiac and mediastinal contours. Slightly prominent bilateral interstitial opacities are nonspecific and favored to represent bronchovascular crowding in the setting of low lung volumes. Moderate-sized hiatal hernia. IMPRESSION: 1. Unchanged appearance of the left costophrenic angle with persistent atelectasis. 2. Moderate-sized hiatal hernia. Electronically Signed   By: Lorenza Cambridge M.D.   On: 05/29/2022 13:04    Assessment       PLAN   ***   Leanna Battles. Melvyn Neth, MHS, PA-C [facility]

## 2022-06-08 ENCOUNTER — Ambulatory Visit (INDEPENDENT_AMBULATORY_CARE_PROVIDER_SITE_OTHER): Payer: 59 | Admitting: Gastroenterology

## 2022-06-08 VITALS — BP 88/53 | Temp 97.1°F

## 2022-06-08 DIAGNOSIS — K5900 Constipation, unspecified: Secondary | ICD-10-CM

## 2022-06-08 DIAGNOSIS — K259 Gastric ulcer, unspecified as acute or chronic, without hemorrhage or perforation: Secondary | ICD-10-CM

## 2022-06-08 DIAGNOSIS — R1319 Other dysphagia: Secondary | ICD-10-CM

## 2022-06-09 ENCOUNTER — Ambulatory Visit: Payer: 59 | Admitting: "Endocrinology

## 2022-06-11 ENCOUNTER — Encounter: Payer: Self-pay | Admitting: Gastroenterology

## 2022-06-11 ENCOUNTER — Telehealth: Payer: Self-pay | Admitting: Gastroenterology

## 2022-06-11 DIAGNOSIS — K5900 Constipation, unspecified: Secondary | ICD-10-CM | POA: Insufficient documentation

## 2022-06-11 NOTE — Telephone Encounter (Signed)
Tammy: please mail patient a copy of AVS from this week.   Mandy: patient needs ov me in 08/2022 for gastric ulcers/schedule EGD.

## 2022-06-11 NOTE — Patient Instructions (Addendum)
It was good to talk with you today!  For your swallowing concerns: try to add back good sources of protein such as finely chopped meats, Greek yogurt, cottage cheese, eggs, nutritional drinks, beans, spinach, broccoli.   For stomach ulcers: continue pantoprazole 40mg  twice daily. You will need a repeat upper endoscopy to make sure your ulcers heal. Plan to complete this in August. We will have a follow up visit prior to the procedure.   For constipation: consider trying miralax 1/2 capful every day to maintain soft stools.   Please call with any questions or concerns!

## 2022-06-11 NOTE — Telephone Encounter (Signed)
AVS has been put in the mail.

## 2022-07-14 ENCOUNTER — Ambulatory Visit: Payer: 59 | Admitting: "Endocrinology

## 2022-07-15 ENCOUNTER — Telehealth: Payer: Self-pay

## 2022-07-15 NOTE — Telephone Encounter (Signed)
Needs to be seen this week?

## 2022-07-15 NOTE — Telephone Encounter (Signed)
Pt's daughter called stating that the pt is gagging every time she eats. Pt is on recall to follow up in August.

## 2022-07-16 ENCOUNTER — Emergency Department (HOSPITAL_COMMUNITY): Payer: 59

## 2022-07-16 ENCOUNTER — Inpatient Hospital Stay (HOSPITAL_COMMUNITY)
Admission: EM | Admit: 2022-07-16 | Discharge: 2022-07-22 | DRG: 690 | Disposition: A | Discharge status: Home / Self Care | Payer: 59 | Attending: Family Medicine | Admitting: Family Medicine

## 2022-07-16 ENCOUNTER — Ambulatory Visit (INDEPENDENT_AMBULATORY_CARE_PROVIDER_SITE_OTHER): Payer: 59 | Admitting: Internal Medicine

## 2022-07-16 ENCOUNTER — Encounter: Payer: Self-pay | Admitting: Internal Medicine

## 2022-07-16 ENCOUNTER — Other Ambulatory Visit: Payer: Self-pay

## 2022-07-16 VITALS — BP 88/65 | HR 130 | Temp 97.9°F

## 2022-07-16 DIAGNOSIS — G2111 Neuroleptic induced parkinsonism: Secondary | ICD-10-CM | POA: Diagnosis present

## 2022-07-16 DIAGNOSIS — Z1623 Resistance to quinolones and fluoroquinolones: Secondary | ICD-10-CM | POA: Diagnosis present

## 2022-07-16 DIAGNOSIS — N390 Urinary tract infection, site not specified: Principal | ICD-10-CM | POA: Diagnosis present

## 2022-07-16 DIAGNOSIS — Z79899 Other long term (current) drug therapy: Secondary | ICD-10-CM

## 2022-07-16 DIAGNOSIS — E861 Hypovolemia: Secondary | ICD-10-CM

## 2022-07-16 DIAGNOSIS — F0280 Dementia in other diseases classified elsewhere without behavioral disturbance: Secondary | ICD-10-CM | POA: Diagnosis present

## 2022-07-16 DIAGNOSIS — K449 Diaphragmatic hernia without obstruction or gangrene: Secondary | ICD-10-CM | POA: Diagnosis present

## 2022-07-16 DIAGNOSIS — T43505A Adverse effect of unspecified antipsychotics and neuroleptics, initial encounter: Secondary | ICD-10-CM | POA: Diagnosis present

## 2022-07-16 DIAGNOSIS — I959 Hypotension, unspecified: Principal | ICD-10-CM | POA: Diagnosis present

## 2022-07-16 DIAGNOSIS — R1310 Dysphagia, unspecified: Secondary | ICD-10-CM

## 2022-07-16 DIAGNOSIS — M419 Scoliosis, unspecified: Secondary | ICD-10-CM | POA: Diagnosis present

## 2022-07-16 DIAGNOSIS — N12 Tubulo-interstitial nephritis, not specified as acute or chronic: Secondary | ICD-10-CM | POA: Diagnosis not present

## 2022-07-16 DIAGNOSIS — I5032 Chronic diastolic (congestive) heart failure: Secondary | ICD-10-CM | POA: Diagnosis present

## 2022-07-16 DIAGNOSIS — Z1152 Encounter for screening for COVID-19: Secondary | ICD-10-CM

## 2022-07-16 DIAGNOSIS — Z8711 Personal history of peptic ulcer disease: Secondary | ICD-10-CM

## 2022-07-16 DIAGNOSIS — Z87442 Personal history of urinary calculi: Secondary | ICD-10-CM

## 2022-07-16 DIAGNOSIS — R9389 Abnormal findings on diagnostic imaging of other specified body structures: Secondary | ICD-10-CM | POA: Diagnosis not present

## 2022-07-16 DIAGNOSIS — B965 Pseudomonas (aeruginosa) (mallei) (pseudomallei) as the cause of diseases classified elsewhere: Secondary | ICD-10-CM | POA: Diagnosis present

## 2022-07-16 DIAGNOSIS — E876 Hypokalemia: Secondary | ICD-10-CM | POA: Diagnosis present

## 2022-07-16 DIAGNOSIS — E872 Acidosis, unspecified: Secondary | ICD-10-CM | POA: Diagnosis present

## 2022-07-16 DIAGNOSIS — K219 Gastro-esophageal reflux disease without esophagitis: Secondary | ICD-10-CM | POA: Diagnosis present

## 2022-07-16 DIAGNOSIS — G20A1 Parkinson's disease without dyskinesia, without mention of fluctuations: Secondary | ICD-10-CM | POA: Diagnosis present

## 2022-07-16 DIAGNOSIS — R1319 Other dysphagia: Secondary | ICD-10-CM | POA: Diagnosis not present

## 2022-07-16 DIAGNOSIS — E1122 Type 2 diabetes mellitus with diabetic chronic kidney disease: Secondary | ICD-10-CM | POA: Diagnosis present

## 2022-07-16 DIAGNOSIS — Z7982 Long term (current) use of aspirin: Secondary | ICD-10-CM

## 2022-07-16 DIAGNOSIS — N1832 Chronic kidney disease, stage 3b: Secondary | ICD-10-CM | POA: Diagnosis present

## 2022-07-16 DIAGNOSIS — N183 Chronic kidney disease, stage 3 unspecified: Secondary | ICD-10-CM | POA: Diagnosis present

## 2022-07-16 DIAGNOSIS — B9620 Unspecified Escherichia coli [E. coli] as the cause of diseases classified elsewhere: Secondary | ICD-10-CM | POA: Diagnosis present

## 2022-07-16 DIAGNOSIS — Z7401 Bed confinement status: Secondary | ICD-10-CM

## 2022-07-16 DIAGNOSIS — R1314 Dysphagia, pharyngoesophageal phase: Secondary | ICD-10-CM | POA: Diagnosis present

## 2022-07-16 DIAGNOSIS — Z794 Long term (current) use of insulin: Secondary | ICD-10-CM

## 2022-07-16 DIAGNOSIS — I130 Hypertensive heart and chronic kidney disease with heart failure and stage 1 through stage 4 chronic kidney disease, or unspecified chronic kidney disease: Secondary | ICD-10-CM | POA: Diagnosis present

## 2022-07-16 DIAGNOSIS — Z66 Do not resuscitate: Secondary | ICD-10-CM | POA: Diagnosis present

## 2022-07-16 DIAGNOSIS — F0390 Unspecified dementia without behavioral disturbance: Secondary | ICD-10-CM | POA: Diagnosis present

## 2022-07-16 DIAGNOSIS — K259 Gastric ulcer, unspecified as acute or chronic, without hemorrhage or perforation: Secondary | ICD-10-CM | POA: Diagnosis present

## 2022-07-16 DIAGNOSIS — I9589 Other hypotension: Secondary | ICD-10-CM | POA: Diagnosis present

## 2022-07-16 LAB — CBC WITH DIFFERENTIAL/PLATELET
Abs Immature Granulocytes: 0.08 10*3/uL — ABNORMAL HIGH (ref 0.00–0.07)
Basophils Absolute: 0 10*3/uL (ref 0.0–0.1)
Basophils Relative: 0 %
Eosinophils Absolute: 0 10*3/uL (ref 0.0–0.5)
Eosinophils Relative: 0 %
HCT: 31.3 % — ABNORMAL LOW (ref 36.0–46.0)
Hemoglobin: 10.8 g/dL — ABNORMAL LOW (ref 12.0–15.0)
Immature Granulocytes: 1 %
Lymphocytes Relative: 12 %
Lymphs Abs: 0.8 10*3/uL (ref 0.7–4.0)
MCH: 29.2 pg (ref 26.0–34.0)
MCHC: 34.5 g/dL (ref 30.0–36.0)
MCV: 84.6 fL (ref 80.0–100.0)
Monocytes Absolute: 0.4 10*3/uL (ref 0.1–1.0)
Monocytes Relative: 6 %
Neutro Abs: 5.8 10*3/uL (ref 1.7–7.7)
Neutrophils Relative %: 81 %
Platelets: 185 10*3/uL (ref 150–400)
RBC: 3.7 MIL/uL — ABNORMAL LOW (ref 3.87–5.11)
RDW: 18.9 % — ABNORMAL HIGH (ref 11.5–15.5)
WBC: 7.1 10*3/uL (ref 4.0–10.5)
nRBC: 0 % (ref 0.0–0.2)

## 2022-07-16 LAB — TROPONIN I (HIGH SENSITIVITY)
Troponin I (High Sensitivity): 16 ng/L (ref <18)
Troponin I (High Sensitivity): 15 ng/L (ref <18)

## 2022-07-16 LAB — URINALYSIS, W/ REFLEX TO CULTURE (INFECTION SUSPECTED)
Bilirubin Urine: NEGATIVE
Glucose, UA: NEGATIVE mg/dL
Ketones, ur: NEGATIVE mg/dL
Nitrite: NEGATIVE
Protein, ur: 30 mg/dL — AB
RBC / HPF: >50 RBC/hpf (ref 0–5)
Specific Gravity, Urine: 1.009 (ref 1.005–1.030)
Squamous Epithelial / HPF: >50 /HPF (ref 0–5)
Trans Epithel, UA: 1
pH: 5 (ref 5.0–8.0)

## 2022-07-16 LAB — COMPREHENSIVE METABOLIC PANEL
ALT: 17 U/L (ref 0–44)
AST: 26 U/L (ref 15–41)
Albumin: <1.5 g/dL — ABNORMAL LOW (ref 3.5–5.0)
Alkaline Phosphatase: 81 U/L (ref 38–126)
Anion gap: 8 (ref 5–15)
BUN: 18 mg/dL (ref 8–23)
CO2: 23 mmol/L (ref 22–32)
Calcium: 8 mg/dL — ABNORMAL LOW (ref 8.9–10.3)
Chloride: 113 mmol/L — ABNORMAL HIGH (ref 98–111)
Creatinine, Ser: 1.4 mg/dL — ABNORMAL HIGH (ref 0.44–1.00)
GFR, Estimated: 38 mL/min — ABNORMAL LOW (ref >60)
Glucose, Bld: 216 mg/dL — ABNORMAL HIGH (ref 70–99)
Potassium: 3.5 mmol/L (ref 3.5–5.1)
Sodium: 144 mmol/L (ref 135–145)
Total Bilirubin: 1.1 mg/dL (ref 0.3–1.2)
Total Protein: 4.5 g/dL — ABNORMAL LOW (ref 6.5–8.1)

## 2022-07-16 LAB — LACTIC ACID, PLASMA
Lactic Acid, Venous: 3.4 mmol/L (ref 0.5–1.9)
Lactic Acid, Venous: 3 mmol/L (ref 0.5–1.9)

## 2022-07-16 LAB — PROTIME-INR
INR: 1 (ref 0.8–1.2)
Prothrombin Time: 13.8 seconds (ref 11.4–15.2)

## 2022-07-16 LAB — RESP PANEL BY RT-PCR (RSV, FLU A&B, COVID)  RVPGX2
Influenza A by PCR: NEGATIVE
Influenza B by PCR: NEGATIVE
Resp Syncytial Virus by PCR: NEGATIVE
SARS Coronavirus 2 by RT PCR: NEGATIVE

## 2022-07-16 LAB — APTT: aPTT: 31 seconds (ref 24–36)

## 2022-07-16 MED ORDER — SODIUM CHLORIDE 0.9 % IV SOLN
2.0000 g | Freq: Once | INTRAVENOUS | Status: AC
  Administered 2022-07-16: 2 g via INTRAVENOUS
  Filled 2022-07-16: qty 12.5

## 2022-07-16 MED ORDER — LACTATED RINGERS IV BOLUS
1000.0000 mL | Freq: Once | INTRAVENOUS | Status: AC
  Administered 2022-07-16: 1000 mL via INTRAVENOUS

## 2022-07-16 MED ORDER — MORPHINE SULFATE (PF) 2 MG/ML IV SOLN
1.0000 mg | INTRAVENOUS | Status: DC | PRN
  Administered 2022-07-17 – 2022-07-20 (×6): 1 mg via INTRAVENOUS
  Filled 2022-07-16 (×6): qty 1

## 2022-07-16 MED ORDER — SODIUM CHLORIDE 0.9 % IV SOLN
2.0000 g | Freq: Two times a day (BID) | INTRAVENOUS | Status: DC
  Administered 2022-07-17: 2 g via INTRAVENOUS
  Filled 2022-07-16: qty 12.5

## 2022-07-16 MED ORDER — HYDROCODONE-ACETAMINOPHEN 5-325 MG PO TABS: 1.0000 | ORAL_TABLET | Freq: Once | ORAL | Status: DC

## 2022-07-16 MED ORDER — IOHEXOL 300 MG/ML  SOLN
80.0000 mL | Freq: Once | INTRAMUSCULAR | Status: AC | PRN
  Administered 2022-07-16: 80 mL via INTRAVENOUS

## 2022-07-16 NOTE — ED Notes (Signed)
Pt transported to CT ?

## 2022-07-16 NOTE — ED Notes (Signed)
Dr. Jearld Fenton at bedside attempting USGIV.

## 2022-07-16 NOTE — H&P (Incomplete)
History and Physical    [name] ZOX:[id number] DOB: 1940/03/18 DOA: 07/16/2022  PCP: [name], MD   Patient coming from: Home  I have personally briefly reviewed patient's old medical records in [facility]  Chief Complaint: Hypotension  HPI: [name] is a 82 y.o. female with medical history significant for CKD stage III, dementia, esophageal dysphagia, gastric ulcer, Parkinson's, diabetes mellitus. Patient was sent to the ED from GI office with reports of hypotension.  Systolic in the 80s.  On my evaluation, patient is awake alert oriented to person and place and to some extent situation.  She is able to answer simple questions, but her daughter [name] at bedside provides the history.   Over the past 3 to 4 days, patient's with When she tried to swallow solids, but she has been able to swallow liquids.  Patient is bedbound and unable to ambulate.  No vomiting.  No loose stools.  Patient denies difficulty breathing, no chest pain, she denies pain with urination.  Recent hospitalization 4/30 to 5/5 for sepsis secondary to pyelonephritis.  Also with esophageal dysphagia, underwent EGD with dilatation 5/2, with findings of large hiatal hernia, and 2 large ulcers in the cardia.  At the GI office today, patient was slumped over, hypotensive and tachycardic.  She was referred to the ED.   ED Course: Blood pressure down to 64/49, improved currently 120s systolic.  Tmax 97.6.  Heart rate 70s to 90s.  Respiratory 10-23. WBC 7.1.  Lactic acidosis 3.4  > 3. CT abdomen and pelvis with contrast shows small bilateral pleural effusion, large hiatal hernia.  UA with small leukocytes and many bacteria. Blood and urine cultures obtained. 2 L bolus given. IV cefepime started.  Review of Systems: As per HPI all other systems reviewed and negative.  Past Medical History:  Diagnosis Date  . CKD (chronic kidney disease)   . Dementia (HCC)   . Diabetes mellitus   . Diastolic heart  failure (HCC)   . GERD (gastroesophageal reflux disease)   . Hypertension   . Parkinson disease   . Scoliosis   . Scoliosis   . UTI (lower urinary tract infection)     Past Surgical History:  Procedure Laterality Date  . BALLOON DILATION N/A 02/21/2021   Procedure: BALLOON DILATION;  Surgeon: Lanelle Bal, DO;  [facility];  Service: Endoscopy;  Laterality: N/A;  . BIOPSY  02/21/2021   Procedure: BIOPSY;  Surgeon: Lanelle Bal, DO;  [facility];  Service: Endoscopy;;  . BIOPSY  05/07/2022   Procedure: BIOPSY;  Surgeon: Corbin Ade, MD;  [facility];  Service: Endoscopy;;  . boil Left    boil removed from left cheek  . ESOPHAGOGASTRODUODENOSCOPY (EGD) WITH PROPOFOL N/A 02/21/2021   Procedure: ESOPHAGOGASTRODUODENOSCOPY (EGD) WITH PROPOFOL;  Surgeon: Lanelle Bal, DO;  [facility];  Service: Endoscopy;  Laterality: N/A;  11:00am  . ESOPHAGOGASTRODUODENOSCOPY (EGD) WITH PROPOFOL N/A 05/07/2022   Procedure: ESOPHAGOGASTRODUODENOSCOPY (EGD) WITH PROPOFOL;  Surgeon: Corbin Ade, MD;  [facility];  Service: Endoscopy;  Laterality: N/A;  +/- dilation  . EYE SURGERY     Retina Detachment  . MALONEY DILATION  05/07/2022   Procedure: MALONEY DILATION;  Surgeon: Corbin Ade, MD;  [facility];  Service: Endoscopy;;  . MASS EXCISION N/A 06/27/2014   Procedure: EXCISION SUBMANDIBULAR STONE;  Surgeon: Serena Colonel, MD;  Location: [facility];  Service: ENT;  Laterality: N/A;  . sublingual gland removal  2016  . tumor removed from rib       reports that she has never smoked. She has never used smokeless tobacco. She reports that she does not drink alcohol and does not use drugs.  No Known Allergies  Family History  Problem Relation Age of Onset  . Colon cancer Maternal Grandmother        70  . Kidney cancer Daughter 66  . Ovarian cancer Daughter 43    Prior to Admission medications   Medication Sig Start  Date End Date Taking? Authorizing Provider  acetaminophen (TYLENOL) 325 MG tablet Take 2 tablets (650 mg total) by mouth every 6 (six) hours as needed for mild pain (or Fever >/= 101). 05/10/22   Shon Hale, MD  acetaminophen-codeine (TYLENOL #3) 300-30 MG tablet Take 1 tablet by mouth 2 (two) times daily. 05/13/22   [provider]  aspirin EC 81 MG tablet Take 1 tablet (81 mg total) by mouth daily with breakfast. 05/10/22   Mariea Clonts, Courage, MD  calcitRIOL (ROCALTROL) 0.25 MCG capsule Take 0.25 mcg by mouth every other day.    [provider]  carvedilol (COREG) 3.125 MG tablet Take 1 tablet (3.125 mg total) by mouth 2 (two) times daily. 05/10/22   Shon Hale, MD  cholecalciferol (VITAMIN D3) 25 MCG (1000 UNIT) tablet Take 1,000 Units by mouth daily.    [provider]  furosemide (LASIX) 20 MG tablet Take 1 tablet (20 mg total) by mouth every morning. 05/10/22   Shon Hale, MD  HUMALOG MIX 50/50 KWIKPEN (50-50) 100 UNIT/ML Kwikpen Take 20-40 Units by mouth 2 (two) times daily before a meal. 40units in the morning and 20 units in the evening 06/01/14   [provider]  loperamide (IMODIUM) 2 MG capsule Take 1 capsule (2 mg total) by mouth 3 (three) times daily as needed for diarrhea or loose stools. Patient taking differently: Take 2-4 mg by mouth 3 (three) times daily as needed for diarrhea or loose stools. 02/02/22   Tiffany Kocher, PA-C  ondansetron (ZOFRAN) 4 MG tablet Take 1 tablet (4 mg total) by mouth every 6 (six) hours as needed for nausea. 05/10/22   Shon Hale, MD  pantoprazole (PROTONIX) 40 MG tablet Take 1 tablet (40 mg total) by mouth 2 (two) times daily. 05/10/22   Shon Hale, MD  potassium chloride (KLOR-CON) 10 MEQ tablet Take 1 tablet (10 mEq total) by mouth daily. Take While taking Lasix/furosemide Patient taking differently: Take 20 mEq by mouth daily. Take While taking Lasix/furosemide 05/10/22   Shon Hale, MD  risedronate  (ACTONEL) 150 MG tablet Take 150 mg by mouth every 30 (thirty) days. with water on empty stomach, nothing by mouth or lie down for next 30 minutes.    [provider]  senna-docusate (SENOKOT-S) 8.6-50 MG tablet Take 2 tablets by mouth at bedtime. 05/10/22   Shon Hale, MD  simvastatin (ZOCOR) 5 MG tablet Take 5 mg by mouth at bedtime. 06/01/14   [provider]    Physical Exam: Vitals:   07/16/22 2048 07/16/22 2115 07/16/22 2120 07/16/22 2216  BP:  132/64 132/81 127/65  Pulse:  77 73 90  Resp:  20 14 17   Temp: 97.6 F (36.4 C)   97.7 F (36.5 C)  TempSrc: Axillary   Oral  SpO2:  97% 97% 100%    Constitutional: Chronically ill-appearing, calm, comfortable Vitals:   07/16/22 2048 07/16/22 2115 07/16/22 2120 07/16/22 2216  BP:  132/64 132/81 127/65  Pulse:  77 73 90  Resp:  20 14 17   Temp: 97.6 F (36.4 C)   97.7 F (36.5 C)  TempSrc: Axillary   Oral  SpO2:  97% 97% 100%   Eyes: PERRL, lids and conjunctivae normal ENMT: Mucous membranes are dry Neck: normal, supple, no masses, no thyromegaly Respiratory: clear to auscultation bilaterally, no wheezing, no crackles. Normal respiratory effort. No accessory muscle use.  Cardiovascular: Regular rate and rhythm, no murmurs / rubs / gallops.  Trace bilateral lower extremity edema.  Extremities warm. Abdomen: no tenderness, no masses palpated. No hepatosplenomegaly. Bowel sounds positive.  Musculoskeletal: no clubbing / cyanosis.  Marked foot drop to right lower extremity. Good ROM, no contractures. Normal muscle tone.  Skin: no rashes, lesions, ulcers. No induration Neurologic: .  Able to grip with bilateral upper extremities.  Able to move lower extremities.  Bedbound. Psychiatric:  Awake and alert, oriented to person and place and to some extent situation..   Labs on Admission: I have personally reviewed following labs and imaging studies  CBC: Recent Labs  Lab 07/16/22 1742  WBC 7.1  NEUTROABS 5.8  HGB  10.8*  HCT 31.3*  MCV 84.6  PLT 185   Basic Metabolic Panel: Recent Labs  Lab 07/16/22 1742  NA 144  K 3.5  CL 113*  CO2 23  GLUCOSE 216*  BUN 18  CREATININE 1.40*  CALCIUM 8.0*   GFR: CrCl cannot be calculated (Unknown ideal weight.). Liver Function Tests: Recent Labs  Lab 07/16/22 1742  AST 26  ALT 17  ALKPHOS 81  BILITOT 1.1  PROT 4.5*  ALBUMIN <1.5*   Coagulation Profile: Recent Labs  Lab 07/16/22 1742  INR 1.0   Urine analysis:    Component Value Date/Time   COLORURINE YELLOW 07/16/2022 1640   APPEARANCEUR TURBID (A) 07/16/2022 1640   APPEARANCEUR Hazy (A) 08/26/2021 1027   LABSPEC 1.009 07/16/2022 1640   PHURINE 5.0 07/16/2022 1640   GLUCOSEU NEGATIVE 07/16/2022 1640   HGBUR SMALL (A) 07/16/2022 1640   BILIRUBINUR NEGATIVE 07/16/2022 1640   BILIRUBINUR Negative 08/26/2021 1027   KETONESUR NEGATIVE 07/16/2022 1640   PROTEINUR 30 (A) 07/16/2022 1640   UROBILINOGEN 1.0 10/12/2014 1935   NITRITE NEGATIVE 07/16/2022 1640   LEUKOCYTESUR SMALL (A) 07/16/2022 1640    Radiological Exams on Admission: CT ABDOMEN PELVIS W CONTRAST  Result Date: 07/16/2022 CLINICAL DATA:  Abdominal/flank pain, stone suspected Sepsis EXAM: CT ABDOMEN AND PELVIS WITH CONTRAST TECHNIQUE: Multidetector CT imaging of the abdomen and pelvis was performed using the standard protocol following bolus administration of intravenous contrast. RADIATION DOSE REDUCTION: This exam was performed according to the departmental dose-optimization program which includes automated exposure control, adjustment of the mA and/or kV according to patient size and/or use of iterative reconstruction technique. CONTRAST:  80mL OMNIPAQUE IOHEXOL 300 MG/ML  SOLN COMPARISON:  04/17/2022 FINDINGS: Lower chest: Small bilateral pleural effusions. Compressive atelectasis in the lower lobes. Large hiatal hernia. Coronary artery and aortic calcifications. Hepatobiliary: Diffuse fatty infiltration of the liver.  Intrahepatic and extrahepatic biliary ductal dilatation is stable since prior study. Pancreas: No focal abnormality or ductal dilatation. Spleen: No focal abnormality.  Normal size. 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 Left adrenal gland unremarkable. Scarring throughout the right kidney. Small cysts in the kidneys bilaterally. No follow-up imaging recommended. No hydronephrosis. Urinary bladder unremarkable. Stomach/Bowel: Left colonic diverticulosis. No active diverticulitis. Stomach and small bowel  decompressed, unremarkable. Vascular/Lymphatic: Aortic atherosclerosis. No evidence of aneurysm or adenopathy. Reproductive: Uterus and right ovary unremarkable. 7 cm cyst in the left ovary, unchanged. Other: No free fluid or free air. Musculoskeletal: Degenerative disc disease and facet disease in the lower lumbar spine. No acute bony abnormality. IMPRESSION: Small bilateral pleural effusions. Compressive atelectasis in the lower lobes. Large hiatal hernia. Severe diffuse fatty infiltration of the liver. Stable biliary ductal dilatation. Left colonic diverticulosis.  No active diverticulitis. 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 Stable 7 cm left ovarian cystic lesion. Electronically Signed   By: Charlett Nose M.D.   On: 07/16/2022 19:13   DG Chest Port 1 View  Result Date: 07/16/2022 CLINICAL DATA:  Altered mental status. Hypotension. Sepsis question. EXAM: PORTABLE CHEST 1 VIEW COMPARISON:  05/29/2022 FINDINGS: The heart is normal in size. Large hiatal hernia courses into the left lung base with adjacent chronic atelectasis and small amount of pleural fluid. No new airspace disease. No pulmonary edema. No pneumothorax. Chronic left shoulder arthropathy. IMPRESSION: Large hiatal hernia with adjacent chronic atelectasis and small amount of pleural fluid at the left lung base. No acute findings. Electronically Signed    By: Narda Rutherford M.D.   On: 07/16/2022 17:35    EKG: Independently reviewed.  Sinus rhythm rate 99, QTc 480.  No significant change from prior  Assessment/Plan Principal Problem:   Hypotension  (please populate well all problems here in Problem List. (For example, if patient is on BP meds at home and you resume or decide to hold them, it is a problem that needs to be her. Same for CAD, COPD, HLD and so on)   ***  Assessment and Plan: No notes have been filed under this hospital service. Service: Hospitalist       DVT prophylaxis: *** (Lovenox/Heparin/SCD's/anticoagulated/None (if comfort care) Code Status: *** (Full/Partial (specify details) Family Communication: *** (Specify name, relationship. Do not write "discussed with patient". Specify tel # if discussed over the phone) Disposition Plan: *** (specify when and where you expect patient to be discharged) Consults called: *** (with names) Admission status: *** (inpatient / obs / tele / medical floor / SDU) I certify that at the point of admission it is my clinical judgment that the patient will require inpatient hospital care spanning beyond 2 midnights from the point of admission due to high intensity of service, high risk for further deterioration and high frequency of surveillance required. The following factors support the patient status of inpatient:    Onnie Boer MD [facility]  07/16/2022, 11:48 PM    Author: Onnie Boer, MD 07/16/2022 11:48 PM  For on call review [email or web address].

## 2022-07-16 NOTE — ED Notes (Signed)
Portable Xray at bedside.

## 2022-07-16 NOTE — Progress Notes (Signed)
Referring Provider: [name], MD Primary Care Physician:  [name], MD Primary GI:  Dr. Marletta Lor  Chief Complaint  Patient presents with   Dysphagia    Gagging every time she eats even when she drinks water    HPI:   [name] is a 82 y.o. female who presents to the clinic today for urgent visit.  Complicated past history.  Admitted to [facility] April 2024 for hypotension in the setting of hypovolemia.  EGD showed presence of moderate Schatzki's ring which was dilated and a large hiatal hernia with associated large gastric ulcers x 2. She has been kept on PPI twice daily and Carafate. Large hiatal hernia that is likely contributing to her dysphagia.  If she persists with dysphagia when advancing her diet despite dilation, there are very few options including surgery for hiatal hernia correction versus surgically placed feeding tube. Patient's daughter was not interested in hiatal hernia repair.   Today, patient's daughter states that she has been unable to tolerate any solid foods for 4 to 5 days.  Drinking some water.  States she has been "gagging" with any solid foods.  Patient will not contribute to HPI today.  She is slumped over in a wheelchair.  Tachycardic and hypotensive.  Past Medical History:  Diagnosis Date   CKD (chronic kidney disease)    Dementia (HCC)    Diabetes mellitus    Diastolic heart failure (HCC)    GERD (gastroesophageal reflux disease)    Hypertension    Parkinson disease    Scoliosis    Scoliosis    UTI (lower urinary tract infection)     Past Surgical History:  Procedure Laterality Date   BALLOON DILATION N/A 02/21/2021   Procedure: BALLOON DILATION;  Surgeon: Lanelle Bal, DO;  [facility];  Service: Endoscopy;  Laterality: N/A;   BIOPSY  02/21/2021   Procedure: BIOPSY;  Surgeon: Lanelle Bal, DO;  [facility];  Service: Endoscopy;;   BIOPSY  05/07/2022   Procedure: BIOPSY;  Surgeon: Corbin Ade, MD;  [facility];  Service: Endoscopy;;   boil Left    boil removed from left cheek   ESOPHAGOGASTRODUODENOSCOPY (EGD) WITH PROPOFOL N/A 02/21/2021   Procedure: ESOPHAGOGASTRODUODENOSCOPY (EGD) WITH PROPOFOL;  Surgeon: Lanelle Bal, DO;  [facility];  Service: Endoscopy;  Laterality: N/A;  11:00am   ESOPHAGOGASTRODUODENOSCOPY (EGD) WITH PROPOFOL N/A 05/07/2022   Procedure: ESOPHAGOGASTRODUODENOSCOPY (EGD) WITH PROPOFOL;  Surgeon: Corbin Ade, MD;  [facility];  Service: Endoscopy;  Laterality: N/A;  +/- dilation   EYE SURGERY     Retina Detachment   MALONEY DILATION  05/07/2022   Procedure: MALONEY DILATION;  Surgeon: Corbin Ade, MD;  [facility];  Service: Endoscopy;;   MASS EXCISION N/A 06/27/2014   Procedure: EXCISION SUBMANDIBULAR STONE;  Surgeon: Serena Colonel, MD;  Location: [facility] OR;  Service: ENT;  Laterality: N/A;   sublingual gland removal  2016   tumor removed from rib      Current Outpatient Medications  Medication Sig Dispense Refill   acetaminophen (TYLENOL) 325 MG tablet Take 2 tablets (650 mg total) by mouth every 6 (six) hours as needed for mild pain (or Fever >/= 101). 100 tablet 1   acetaminophen-codeine (TYLENOL #3) 300-30 MG tablet Take 1 tablet by mouth 2 (two) times daily.     aspirin EC 81 MG tablet Take 1 tablet (81 mg total) by  mouth daily with breakfast. 30 tablet 11   calcitRIOL (ROCALTROL) 0.25 MCG capsule Take 0.25 mcg by mouth every other day.     carvedilol (COREG) 3.125 MG tablet Take 1 tablet (3.125 mg total) by mouth 2 (two) times daily. 60 tablet 4   cholecalciferol (VITAMIN D3) 25 MCG (1000 UNIT) tablet Take 1,000 Units by mouth daily.     furosemide (LASIX) 20 MG tablet Take 1 tablet (20 mg total) by mouth every morning. 30 tablet 2   HUMALOG MIX 50/50 KWIKPEN (50-50) 100 UNIT/ML Kwikpen Take 20-40 Units by mouth 2 (two) times daily before a meal. 40units in the morning and 20 units in the evening      loperamide (IMODIUM) 2 MG capsule Take 1 capsule (2 mg total) by mouth 3 (three) times daily as needed for diarrhea or loose stools. (Patient taking differently: Take 2-4 mg by mouth 3 (three) times daily as needed for diarrhea or loose stools.) 30 capsule 1   ondansetron (ZOFRAN) 4 MG tablet Take 1 tablet (4 mg total) by mouth every 6 (six) hours as needed for nausea. 30 tablet 2   pantoprazole (PROTONIX) 40 MG tablet Take 1 tablet (40 mg total) by mouth 2 (two) times daily. 60 tablet 5   potassium chloride (KLOR-CON) 10 MEQ tablet Take 1 tablet (10 mEq total) by mouth daily. Take While taking Lasix/furosemide (Patient taking differently: Take 20 mEq by mouth daily. Take While taking Lasix/furosemide) 30 tablet 2   risedronate (ACTONEL) 150 MG tablet Take 150 mg by mouth every 30 (thirty) days. with water on empty stomach, nothing by mouth or lie down for next 30 minutes.     senna-docusate (SENOKOT-S) 8.6-50 MG tablet Take 2 tablets by mouth at bedtime. 60 tablet 3   simvastatin (ZOCOR) 5 MG tablet Take 5 mg by mouth at bedtime.     No current facility-administered medications for this visit.    Allergies as of 07/16/2022   (No Known Allergies)    Family History  Problem Relation Age of Onset   Colon cancer Maternal Grandmother        36   Kidney cancer Daughter 92   Ovarian cancer Daughter 35    Social History   Socioeconomic History   Marital status: Divorced    Spouse name: Not on file   Number of children: Not on file   Years of education: Not on file   Highest education level: Not on file  Occupational History   Not on file  Tobacco Use   Smoking status: Never   Smokeless tobacco: Never  Vaping Use   Vaping status: Never Used  Substance and Sexual Activity   Alcohol use: No    Alcohol/week: 0.0 standard drinks of alcohol   Drug use: No   Sexual activity: Not Currently    Birth control/protection: Post-menopausal, None  Other Topics Concern   Not on file  Social  History Narrative   Not on file   Social Determinants of Health   Financial Resource Strain: Low Risk  (02/10/2022)   Overall Financial Resource Strain (CARDIA)    Difficulty of Paying Living Expenses: Not hard at all  Food Insecurity: No Food Insecurity (05/05/2022)   Hunger Vital Sign    Worried About Running Out of Food in the Last Year: Never true    Ran Out of Food in the Last Year: Never true  Transportation Needs: No Transportation Needs (05/05/2022)   PRAPARE - Transportation    Lack of Transportation (  Medical): No    Lack of Transportation (Non-Medical): No  Physical Activity: Inactive (02/10/2022)   Exercise Vital Sign    Days of Exercise per Week: 0 days    Minutes of Exercise per Session: 0 min  Stress: No Stress Concern Present (02/10/2022)   [employer] of Occupational Health - Occupational Stress Questionnaire    Feeling of Stress : Not at all  Social Connections: Socially Isolated (02/10/2022)   Social Connection and Isolation Panel [NHANES]    Frequency of Communication with Friends and Family: Three times a week    Frequency of Social Gatherings with Friends and Family: Twice a week    Attends Religious Services: Never    Database administrator or Organizations: No    Attends Engineer, structural: Never    Marital Status: Divorced    Subjective: Review of Systems  Constitutional:  Negative for chills and fever.  HENT:  Negative for congestion and hearing loss.   Eyes:  Negative for blurred vision and double vision.  Respiratory:  Negative for cough and shortness of breath.   Cardiovascular:  Negative for chest pain and palpitations.  Gastrointestinal:  Negative for abdominal pain, blood in stool, constipation, diarrhea, heartburn, melena and vomiting.  Genitourinary:  Negative for dysuria and urgency.  Musculoskeletal:  Negative for joint pain and myalgias.  Skin:  Negative for itching and rash.  Neurological:  Negative for dizziness and headaches.   Psychiatric/Behavioral:  Negative for depression. The patient is not nervous/anxious.      Objective: BP (!) 88/65 (BP Location: Right Arm, Patient Position: Sitting, Cuff Size: Normal)   Pulse (!) 130   Temp 97.9 F (36.6 C) (Temporal)  Physical Exam Constitutional:      Appearance: Normal appearance. She is ill-appearing.  HENT:     Head: Normocephalic and atraumatic.  Eyes:     Extraocular Movements: Extraocular movements intact.     Conjunctiva/sclera: Conjunctivae normal.  Cardiovascular:     Rate and Rhythm: Normal rate and regular rhythm.  Pulmonary:     Effort: Pulmonary effort is normal.     Breath sounds: Normal breath sounds.  Abdominal:     General: Bowel sounds are normal.     Palpations: Abdomen is soft.  Musculoskeletal:        General: No swelling. Normal range of motion.     Cervical back: Normal range of motion and neck supple.  Skin:    General: Skin is warm and dry.     Coloration: Skin is not jaundiced.  Neurological:     General: No focal deficit present.     Mental Status: She is alert and oriented to person, place, and time.  Psychiatric:        Mood and Affect: Mood normal.        Behavior: Behavior normal.      Assessment/Plan:  Patient looks quite unwell in clinic today, hypotensive, tachycardic.  Notes nausea and vomiting, poor p.o. intake.  Recommend she be evaluated urgently in the emergency room to rule out profound hypovolemia versus possible sepsis vs other.  Discussed this with patient's daughter who is in agreement.  Paramedics have been called.  ER has been made aware.  07/16/2022 3:28 PM   Disclaimer: This note was dictated with voice recognition software. Similar sounding words can inadvertently be transcribed and may not be corrected upon review.

## 2022-07-16 NOTE — Telephone Encounter (Signed)
Pt is scheduled with Dr. Marletta Lor today at 3pm

## 2022-07-16 NOTE — H&P (Signed)
History and Physical    [name] ZOX:[id number] DOB: 05-12-1940 DOA: 07/16/2022  PCP: [name], MD   Patient coming from: Home  I have personally briefly reviewed patient's old medical records in [facility]  Chief Complaint: Hypotension  HPI: [name] is a 82 y.o. female with medical history significant for CKD stage III, dementia, esophageal dysphagia, gastric ulcer, Parkinson's, diabetes mellitus. Patient was sent to the ED from GI office with reports of hypotension.  Systolic in the 80s.  On my evaluation, patient is awake alert oriented to person and place and to some extent situation.  She is able to answer simple questions, but her daughter [name] at bedside provides the history.   Over the past 3 to 4 days, patient's with When she tried to swallow solids, but she has been able to swallow liquids.  Patient is bedbound and unable to ambulate.  No vomiting.  No loose stools.  Patient denies difficulty breathing, no chest pain, she denies pain with urination.  Recent hospitalization 4/30 to 5/5 for sepsis secondary to pyelonephritis.  Also with esophageal dysphagia, underwent EGD with dilatation 5/2, with findings of large hiatal hernia, and 2 large ulcers in the cardia.  At the GI office today, patient was slumped over, hypotensive and tachycardic.  She was referred to the ED.   ED Course: Blood pressure down to 64/49, improved currently 120s systolic.  Tmax 97.6.  Heart rate 70s to 90s.  Respiratory 10-23. WBC 7.1.  Lactic acidosis 3.4  > 3. CT abdomen and pelvis with contrast shows small bilateral pleural effusion, large hiatal hernia.  UA with small leukocytes and many bacteria. Blood and urine cultures obtained. 2 L bolus given. IV cefepime started.  Review of Systems: As per HPI all other systems reviewed and negative.  Past Medical History:  Diagnosis Date   CKD (chronic kidney disease)    Dementia (HCC)    Diabetes mellitus    Diastolic heart failure  (HCC)    GERD (gastroesophageal reflux disease)    Hypertension    Parkinson disease    Scoliosis    Scoliosis    UTI (lower urinary tract infection)     Past Surgical History:  Procedure Laterality Date   BALLOON DILATION N/A 02/21/2021   Procedure: BALLOON DILATION;  Surgeon: Lanelle Bal, DO;  [facility];  Service: Endoscopy;  Laterality: N/A;   BIOPSY  02/21/2021   Procedure: BIOPSY;  Surgeon: Lanelle Bal, DO;  [facility];  Service: Endoscopy;;   BIOPSY  05/07/2022   Procedure: BIOPSY;  Surgeon: Corbin Ade, MD;  [facility]: [facility];  Service: Endoscopy;;   boil Left    boil removed from left cheek   ESOPHAGOGASTRODUODENOSCOPY (EGD) WITH PROPOFOL N/A 02/21/2021   Procedure: ESOPHAGOGASTRODUODENOSCOPY (EGD) WITH PROPOFOL;  Surgeon: Lanelle Bal, DO;  [facility];  Service: Endoscopy;  Laterality: N/A;  11:00am   ESOPHAGOGASTRODUODENOSCOPY (EGD) WITH PROPOFOL N/A 05/07/2022   Procedure: ESOPHAGOGASTRODUODENOSCOPY (EGD) WITH PROPOFOL;  Surgeon: Corbin Ade, MD;  [facility];  Service: Endoscopy;  Laterality: N/A;  +/- dilation   EYE SURGERY     Retina Detachment   MALONEY DILATION  05/07/2022   Procedure: MALONEY DILATION;  Surgeon: Corbin Ade, MD;  Location: [facility];  Service: Endoscopy;;   MASS EXCISION N/A 06/27/2014   Procedure: EXCISION SUBMANDIBULAR STONE;  Surgeon: Serena Colonel, MD;  Location: [facility] OR;  Service: ENT;  Laterality: N/A;   sublingual gland removal  2016   tumor removed from rib       reports that she has never smoked. She has never used smokeless tobacco. She reports that she does not drink alcohol and does not use drugs.  No Known Allergies  Family History  Problem Relation Age of Onset   Colon cancer Maternal Grandmother        31   Kidney cancer Daughter 46   Ovarian cancer Daughter 57    Prior to Admission medications   Medication Sig Start Date End Date Taking?  Authorizing Provider  acetaminophen (TYLENOL) 325 MG tablet Take 2 tablets (650 mg total) by mouth every 6 (six) hours as needed for mild pain (or Fever >/= 101). 05/10/22   Shon Hale, MD  acetaminophen-codeine (TYLENOL #3) 300-30 MG tablet Take 1 tablet by mouth 2 (two) times daily. 05/13/22   [provider]  aspirin EC 81 MG tablet Take 1 tablet (81 mg total) by mouth daily with breakfast. 05/10/22   Mariea Clonts, Courage, MD  calcitRIOL (ROCALTROL) 0.25 MCG capsule Take 0.25 mcg by mouth every other day.    [provider]  carvedilol (COREG) 3.125 MG tablet Take 1 tablet (3.125 mg total) by mouth 2 (two) times daily. 05/10/22   Shon Hale, MD  cholecalciferol (VITAMIN D3) 25 MCG (1000 UNIT) tablet Take 1,000 Units by mouth daily.    [provider]  furosemide (LASIX) 20 MG tablet Take 1 tablet (20 mg total) by mouth every morning. 05/10/22   Shon Hale, MD  HUMALOG MIX 50/50 KWIKPEN (50-50) 100 UNIT/ML Kwikpen Take 20-40 Units by mouth 2 (two) times daily before a meal. 40units in the morning and 20 units in the evening 06/01/14   [provider]  loperamide (IMODIUM) 2 MG capsule Take 1 capsule (2 mg total) by mouth 3 (three) times daily as needed for diarrhea or loose stools. Patient taking differently: Take 2-4 mg by mouth 3 (three) times daily as needed for diarrhea or loose stools. 02/02/22   Tiffany Kocher, PA-C  ondansetron (ZOFRAN) 4 MG tablet Take 1 tablet (4 mg total) by mouth every 6 (six) hours as needed for nausea. 05/10/22   Shon Hale, MD  pantoprazole (PROTONIX) 40 MG tablet Take 1 tablet (40 mg total) by mouth 2 (two) times daily. 05/10/22   Shon Hale, MD  potassium chloride (KLOR-CON) 10 MEQ tablet Take 1 tablet (10 mEq total) by mouth daily. Take While taking Lasix/furosemide Patient taking differently: Take 20 mEq by mouth daily. Take While taking Lasix/furosemide 05/10/22   Shon Hale, MD  risedronate (ACTONEL) 150 MG  tablet Take 150 mg by mouth every 30 (thirty) days. with water on empty stomach, nothing by mouth or lie down for next 30 minutes.    [provider]  senna-docusate (SENOKOT-S) 8.6-50 MG tablet Take 2 tablets by mouth at bedtime. 05/10/22   Shon Hale, MD  simvastatin (ZOCOR) 5 MG tablet Take 5 mg by mouth at bedtime. 06/01/14   [provider]    Physical Exam: Vitals:   07/16/22 2048 07/16/22 2115 07/16/22 2120 07/16/22 2216  BP:  132/64 132/81 127/65  Pulse:  77 73 90  Resp:  20 14 17   Temp: 97.6 F (36.4 C)   97.7 F (36.5 C)  TempSrc: Axillary   Oral  SpO2:  97% 97% 100%    Constitutional: Chronically ill-appearing, calm, comfortable Vitals:   07/16/22 2048 07/16/22 2115 07/16/22 2120 07/16/22 2216  BP:  132/64 132/81 127/65  Pulse:  77 73 90  Resp:  20 14 17   Temp: 97.6 F (36.4 C)   97.7 F (36.5 C)  TempSrc: Axillary   Oral  SpO2:  97% 97% 100%   Eyes: PERRL, lids and conjunctivae normal ENMT: Mucous membranes are dry Neck: normal, supple, no masses, no thyromegaly Respiratory: clear to auscultation bilaterally, no wheezing, no crackles. Normal respiratory effort. No accessory muscle use.  Cardiovascular: Regular rate and rhythm, no murmurs / rubs / gallops.  Trace bilateral lower extremity edema.  Extremities warm. Abdomen: no tenderness, no masses palpated. No hepatosplenomegaly. Bowel sounds positive.  Musculoskeletal: no clubbing / cyanosis.  Marked foot drop to right lower extremity. Good ROM, no contractures. Normal muscle tone.  Skin: no rashes, lesions, ulcers. No induration Neurologic: .  Able to grip with bilateral upper extremities.  Able to move lower extremities.  Bedbound. Psychiatric:  Awake and alert, oriented to person and place and to some extent situation..   Labs on Admission: I have personally reviewed following labs and imaging studies  CBC: Recent Labs  Lab 07/16/22 1742  WBC 7.1  NEUTROABS 5.8  HGB 10.8*  HCT  31.3*  MCV 84.6  PLT 185   Basic Metabolic Panel: Recent Labs  Lab 07/16/22 1742  NA 144  K 3.5  CL 113*  CO2 23  GLUCOSE 216*  BUN 18  CREATININE 1.40*  CALCIUM 8.0*   GFR: CrCl cannot be calculated (Unknown ideal weight.). Liver Function Tests: Recent Labs  Lab 07/16/22 1742  AST 26  ALT 17  ALKPHOS 81  BILITOT 1.1  PROT 4.5*  ALBUMIN <1.5*   Coagulation Profile: Recent Labs  Lab 07/16/22 1742  INR 1.0   Urine analysis:    Component Value Date/Time   COLORURINE YELLOW 07/16/2022 1640   APPEARANCEUR TURBID (A) 07/16/2022 1640   APPEARANCEUR Hazy (A) 08/26/2021 1027   LABSPEC 1.009 07/16/2022 1640   PHURINE 5.0 07/16/2022 1640   GLUCOSEU NEGATIVE 07/16/2022 1640   HGBUR SMALL (A) 07/16/2022 1640   BILIRUBINUR NEGATIVE 07/16/2022 1640   BILIRUBINUR Negative 08/26/2021 1027   KETONESUR NEGATIVE 07/16/2022 1640   PROTEINUR 30 (A) 07/16/2022 1640   UROBILINOGEN 1.0 10/12/2014 1935   NITRITE NEGATIVE 07/16/2022 1640   LEUKOCYTESUR SMALL (A) 07/16/2022 1640    Radiological Exams on Admission: CT ABDOMEN PELVIS W CONTRAST  Result Date: 07/16/2022 CLINICAL DATA:  Abdominal/flank pain, stone suspected Sepsis EXAM: CT ABDOMEN AND PELVIS WITH CONTRAST TECHNIQUE: Multidetector CT imaging of the abdomen and pelvis was performed using the standard protocol following bolus administration of intravenous contrast. RADIATION DOSE REDUCTION: This exam was performed according to the departmental dose-optimization program which includes automated exposure control, adjustment of the mA and/or kV according to patient size and/or use of iterative reconstruction technique. CONTRAST:  80mL OMNIPAQUE IOHEXOL 300 MG/ML  SOLN COMPARISON:  04/17/2022 FINDINGS: Lower chest: Small bilateral pleural effusions. Compressive atelectasis in the lower lobes. Large hiatal hernia. Coronary artery and aortic calcifications. Hepatobiliary: Diffuse fatty infiltration of the liver. Intrahepatic and  extrahepatic biliary ductal dilatation is stable since prior study. Pancreas: No focal abnormality or ductal dilatation. Spleen: No focal abnormality.  Normal size. 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 Left adrenal gland unremarkable. Scarring throughout the right kidney. Small cysts in the kidneys bilaterally. No follow-up imaging recommended. No hydronephrosis. Urinary bladder unremarkable. Stomach/Bowel: Left colonic diverticulosis. No active diverticulitis. Stomach and small bowel  decompressed, unremarkable. Vascular/Lymphatic: Aortic atherosclerosis. No evidence of aneurysm or adenopathy. Reproductive: Uterus and right ovary unremarkable. 7 cm cyst in the left ovary, unchanged. Other: No free fluid or free air. Musculoskeletal: Degenerative disc disease and facet disease in the lower lumbar spine. No acute bony abnormality. IMPRESSION: Small bilateral pleural effusions. Compressive atelectasis in the lower lobes. Large hiatal hernia. Severe diffuse fatty infiltration of the liver. Stable biliary ductal dilatation. Left colonic diverticulosis.  No active diverticulitis. 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 Stable 7 cm left ovarian cystic lesion. Electronically Signed   By: Charlett Nose M.D.   On: 07/16/2022 19:13   DG Chest Port 1 View  Result Date: 07/16/2022 CLINICAL DATA:  Altered mental status. Hypotension. Sepsis question. EXAM: PORTABLE CHEST 1 VIEW COMPARISON:  05/29/2022 FINDINGS: The heart is normal in size. Large hiatal hernia courses into the left lung base with adjacent chronic atelectasis and small amount of pleural fluid. No new airspace disease. No pulmonary edema. No pneumothorax. Chronic left shoulder arthropathy. IMPRESSION: Large hiatal hernia with adjacent chronic atelectasis and small amount of pleural fluid at the left lung base. No acute findings. Electronically Signed   By: Narda Rutherford M.D.   On: 07/16/2022 17:35    EKG: Independently reviewed.  Sinus rhythm rate 99, QTc 480.  No significant change from prior  Assessment/Plan Principal Problem:   Hypotension Active Problems:   Esophageal dysphagia   Diabetes mellitus with stage 3 chronic kidney disease (HCC)   Dementia (HCC)   CKD (chronic kidney disease) stage 3, GFR 30-59 ml/min (HCC)   Multiple gastric ulcers   Assessment and Plan: * Hypotension Hypotension likely from poor oral intake over the past several days. 2/2 esophageal dysphagia.  UA also suggestive of possible UTI.  Portable chest x-ray showing small amount of pleural fluid in left lung base, and hiatal hernia- large.  Hypotension with likely resultant lactic acidosis of 3.4 > 3.  Rules out for sepsis at this time.  Afebrile.  WBC 7.1..  Recent urine culture 04/2022, resistant to Cipro otherwise pansensitive -Continue IV cefepime -2 L bolus given, continue LR 75cc/hr x 1 day -Trend lactic acid -Follow-up urine cultures, blood cultures -Hold antihypertensives carvedilol, and Lasix 20 mg daily  Esophageal dysphagia Unable to take solids for the past 3 days, but able to drink liquids.  Gagging with solids.  Underwent EGD with dilatation 5/2, showed ulcers also found in [place]. -Remain n.p.o. -CBG q. 8 hourly - Hydrate -GI to see in consult in a.m. -IV Protonix 40 daily  CKD (chronic kidney disease) stage 3, GFR 30-59 ml/min (HCC) CKD stage IIIb.  Creatinine stable 1.4.  Dementia (HCC) Per daughter-. Mild to moderate.  Has good and bad days, she is able to recognize family, but on bad days her speech is confused.  Diabetes mellitus with stage 3 chronic kidney disease (HCC) ON humalog 50-50, 20 - 40U BID.  A1c 8.3. -Monitor CBG while n.p.o. - Hold home insulin - SSI- S q6h   DVT prophylaxis: SCDS-  pending GI eval in a.m Code Status:  DNR-family patient's daughter [name] at bedside. Family Communication: Patient's daughter [name]  at bedside this HCPOA. Disposition Plan: ~ 2 days Consults called: GI Admission status:  Obs tele    Author: Onnie Boer, MD 07/16/2022 12:15 AM  For on call review [email or web address].

## 2022-07-16 NOTE — Progress Notes (Signed)
Pharmacy Antibiotic Note  [name] is a 82 y.o. female admitted on 07/16/2022 with UTI.  Pharmacy has been consulted for cefepime dosing.Based on esitmated weight from RN, CrCl ~34 ml/min. Patient received stat once dose of cefepime.   Plan: Continue Cefepime 2gm IV q12h Monitor renal function, C &S, and clinical course    Temp (24hrs), Avg:97.8 F (36.6 C), Min:97.6 F (36.4 C), Max:97.9 F (36.6 C)  Recent Labs  Lab 07/16/22 1630 07/16/22 1742  WBC  --  7.1  CREATININE  --  1.40*  LATICACIDVEN 3.4* 3.0*    CrCl cannot be calculated (Unknown ideal weight.).    No Known Allergies  Antimicrobials this admission: 7/11 cefepime >>   Dose adjustments this admission:    Ryan Ogborn A. Jeanella Craze, PharmD, BCPS, FNKF Clinical Pharmacist [place] Please utilize Amion for appropriate phone number to reach the unit pharmacist [facility])  07/16/2022 7:59 PM

## 2022-07-16 NOTE — ED Provider Notes (Signed)
[facility] Provider Note   CSN: [id number] Arrival date & time: 07/16/22  1551     History  Chief Complaint  Patient presents with   Hypotension   Altered Mental Status    [name] is a 82 y.o. female with CKD stage III, multiple gastric ulcers, GERD, esophageal dysphagia, hypertension, syncope, neuroleptic induced parkinsonism, who presents with AMS, hypotension.  Per chart review patient was admitted in May 2024 for sepsis w/ hypotension and pyelonephritis.  Patient presents today with altered mental status and hypotension.  She was reportedly at her GI clinic when she became acutely altered, blood pressure found to be 50/30.  EMS could not obtain access.  Pressure 60/40 in Trendelenburg.  Patient presents A&O x 2 (self and place but not time or situation) complaining of back pain.  Pression arrival 64/49.  Pulse 92.  SpO2 95% on room air.  Further history limited by acuity of situation.   Altered Mental Status      Home Medications Prior to Admission medications   Medication Sig Start Date End Date Taking? Authorizing Provider  acetaminophen (TYLENOL) 325 MG tablet Take 2 tablets (650 mg total) by mouth every 6 (six) hours as needed for mild pain (or Fever >/= 101). 05/10/22   Shon Hale, MD  acetaminophen-codeine (TYLENOL #3) 300-30 MG tablet Take 1 tablet by mouth 2 (two) times daily. 05/13/22   [provider]  aspirin EC 81 MG tablet Take 1 tablet (81 mg total) by mouth daily with breakfast. 05/10/22   Emokpae, Courage, MD  benzonatate (TESSALON) 100 MG capsule Take by mouth. 06/10/22   [provider]  calcitRIOL (ROCALTROL) 0.25 MCG capsule Take 0.25 mcg by mouth every other day.    [provider]  carvedilol (COREG) 3.125 MG tablet Take 1 tablet (3.125 mg total) by mouth 2 (two) times daily. 05/10/22   Shon Hale, MD  cholecalciferol (VITAMIN D3) 25 MCG (1000 UNIT) tablet Take 1,000 Units by  mouth daily.    [provider]  furosemide (LASIX) 20 MG tablet Take 1 tablet (20 mg total) by mouth every morning. 05/10/22   Shon Hale, MD  HUMALOG MIX 50/50 KWIKPEN (50-50) 100 UNIT/ML Kwikpen Take 20-40 Units by mouth 2 (two) times daily before a meal. 40units in the morning and 20 units in the evening 06/01/14   [provider]  loperamide (IMODIUM) 2 MG capsule Take 1 capsule (2 mg total) by mouth 3 (three) times daily as needed for diarrhea or loose stools. Patient taking differently: Take 2-4 mg by mouth 3 (three) times daily as needed for diarrhea or loose stools. 02/02/22   Tiffany Kocher, PA-C  omeprazole (PRILOSEC) 20 MG capsule Take 20 mg by mouth 2 (two) times daily. 06/24/22   [provider]  ondansetron (ZOFRAN) 4 MG tablet Take 1 tablet (4 mg total) by mouth every 6 (six) hours as needed for nausea. 05/10/22   Shon Hale, MD  pantoprazole (PROTONIX) 40 MG tablet Take 1 tablet (40 mg total) by mouth 2 (two) times daily. 05/10/22   Shon Hale, MD  potassium chloride (KLOR-CON) 10 MEQ tablet Take 1 tablet (10 mEq total) by mouth daily. Take While taking Lasix/furosemide Patient taking differently: Take 20 mEq by mouth daily. Take While taking Lasix/furosemide 05/10/22   Shon Hale, MD  Potassium Chloride ER 20 MEQ TBCR Take 1 tablet by mouth daily. 06/24/22   [provider]  risedronate (ACTONEL) 150 MG tablet  Take 150 mg by mouth every 30 (thirty) days. with water on empty stomach, nothing by mouth or lie down for next 30 minutes.    [provider]  senna-docusate (SENOKOT-S) 8.6-50 MG tablet Take 2 tablets by mouth at bedtime. 05/10/22   Shon Hale, MD  simvastatin (ZOCOR) 5 MG tablet Take 5 mg by mouth at bedtime. 06/01/14   [provider]      Allergies    Patient has no known allergies.    Review of Systems   Review of Systems A 10 point review of systems was performed and is negative unless otherwise  reported in HPI.  Physical Exam Updated Vital Signs BP 117/67 (BP Location: Right Wrist)   Pulse 74   Temp 97.9 F (36.6 C) (Oral)   Resp 16   Ht 5\' 5"  (1.651 m)   Wt 72.3 kg   SpO2 100%   BMI 26.52 kg/m  Physical Exam General: Elderly appearing female, lying in bed.  HEENT: PERRLA, EOMI, Sclera anicteric, MMM, trachea midline. Tongue protrudes midline.  Cardiology: RRR, no murmurs/rubs/gallops. BL radial and DP pulses equal bilaterally.  Resp: Normal respiratory rate and effort. CTAB, no wheezes, rhonchi, crackles.  Abd: Soft, non-tender, non-distended. No rebound tenderness or guarding.  GU: Deferred. MSK: No peripheral edema or signs of trauma. Skin: warm, dry.  Neuro: A&Ox2, CNs II-XII grossly intact. MAEs. Sensation grossly intact.   ED Results / Procedures / Treatments   Labs (all labs ordered are listed, but only abnormal results are displayed) Labs Reviewed  LACTIC ACID, PLASMA - Abnormal; Notable for the following components:      Result Value   Lactic Acid, Venous 3.4 (*)    All other components within normal limits  LACTIC ACID, PLASMA - Abnormal; Notable for the following components:   Lactic Acid, Venous 3.0 (*)    All other components within normal limits  URINALYSIS, W/ REFLEX TO CULTURE (INFECTION SUSPECTED) - Abnormal; Notable for the following components:   APPearance TURBID (*)    Hgb urine dipstick SMALL (*)    Protein, ur 30 (*)    Leukocytes,Ua SMALL (*)    Bacteria, UA MANY (*)    All other components within normal limits  CBC WITH DIFFERENTIAL/PLATELET - Abnormal; Notable for the following components:   RBC 3.70 (*)    Hemoglobin 10.8 (*)    HCT 31.3 (*)    RDW 18.9 (*)    Abs Immature Granulocytes 0.08 (*)    All other components within normal limits  COMPREHENSIVE METABOLIC PANEL - Abnormal; Notable for the following components:   Chloride 113 (*)    Glucose, Bld 216 (*)    Creatinine, Ser 1.40 (*)    Calcium 8.0 (*)    Total Protein  4.5 (*)    Albumin <1.5 (*)    GFR, Estimated 38 (*)    All other components within normal limits    EKG EKG Interpretation Date/Time:  Thursday July 16 2022 16:05:33 EDT Ventricular Rate:  99 PR Interval:  171 QRS Duration:  83 QT Interval:  374 QTC Calculation: 480 R Axis:   31  Text Interpretation: Sinus rhythm Low voltage, extremity leads Confirmed by Vivi Barrack [phone]) on 07/16/2022 6:16:18 PM  Radiology CT ABDOMEN PELVIS W CONTRAST  Result Date: 07/16/2022 CLINICAL DATA:  Abdominal/flank pain, stone suspected Sepsis EXAM: CT ABDOMEN AND PELVIS WITH CONTRAST TECHNIQUE: Multidetector CT imaging of the abdomen and pelvis was performed using the standard protocol following bolus administration of  intravenous contrast. RADIATION DOSE REDUCTION: This exam was performed according to the departmental dose-optimization program which includes automated exposure control, adjustment of the mA and/or kV according to patient size and/or use of iterative reconstruction technique. CONTRAST:  80mL OMNIPAQUE IOHEXOL 300 MG/ML  SOLN COMPARISON:  04/17/2022 FINDINGS: Lower chest: Small bilateral pleural effusions. Compressive atelectasis in the lower lobes. Large hiatal hernia. Coronary artery and aortic calcifications. Hepatobiliary: Diffuse fatty infiltration of the liver. Intrahepatic and extrahepatic biliary ductal dilatation is stable since prior study. Pancreas: No focal abnormality or ductal dilatation. Spleen: No focal abnormality.  Normal size. 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 Left adrenal gland unremarkable. Scarring throughout the right kidney. Small cysts in the kidneys bilaterally. No follow-up imaging recommended. No hydronephrosis. Urinary bladder unremarkable. Stomach/Bowel: Left colonic diverticulosis. No active diverticulitis. Stomach and small bowel decompressed, unremarkable. Vascular/Lymphatic: Aortic  atherosclerosis. No evidence of aneurysm or adenopathy. Reproductive: Uterus and right ovary unremarkable. 7 cm cyst in the left ovary, unchanged. Other: No free fluid or free air. Musculoskeletal: Degenerative disc disease and facet disease in the lower lumbar spine. No acute bony abnormality. IMPRESSION: Small bilateral pleural effusions. Compressive atelectasis in the lower lobes. Large hiatal hernia. Severe diffuse fatty infiltration of the liver. Stable biliary ductal dilatation. Left colonic diverticulosis.  No active diverticulitis. 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 Stable 7 cm left ovarian cystic lesion. Electronically Signed   By: Charlett Nose M.D.   On: 07/16/2022 19:13   DG Chest Port 1 View  Result Date: 07/16/2022 CLINICAL DATA:  Altered mental status. Hypotension. Sepsis question. EXAM: PORTABLE CHEST 1 VIEW COMPARISON:  05/29/2022 FINDINGS: The heart is normal in size. Large hiatal hernia courses into the left lung base with adjacent chronic atelectasis and small amount of pleural fluid. No new airspace disease. No pulmonary edema. No pneumothorax. Chronic left shoulder arthropathy. IMPRESSION: Large hiatal hernia with adjacent chronic atelectasis and small amount of pleural fluid at the left lung base. No acute findings. Electronically Signed   By: Narda Rutherford M.D.   On: 07/16/2022 17:35    Procedures Ultrasound ED Peripheral IV (Provider)  Date/Time: 07/16/2022 4:30 PM  Performed by: Loetta Rough, MD Authorized by: Loetta Rough, MD   Procedure details:    Indications: hydration, hypotension, multiple failed IV attempts and poor IV access     Skin Prep: chlorhexidine gluconate     Location:  Left AC   Angiocath:  20 G   Bedside Ultrasound Guided: Yes     Images: not archived     Patient tolerated procedure without complications: Yes     Dressing applied: Yes   .Critical Care  Performed by: Loetta Rough, MD Authorized by: Loetta Rough, MD    Critical care provider statement:    Critical care time (minutes):  60   Critical care was necessary to treat or prevent imminent or life-threatening deterioration of the following conditions:  Sepsis, shock and CNS failure or compromise   Critical care was time spent personally by me on the following activities:  Development of treatment plan with patient or surrogate, discussions with consultants, evaluation of patient's response to treatment, examination of patient, ordering and review of laboratory studies, ordering and review of radiographic studies, ordering and performing treatments and interventions, pulse oximetry, re-evaluation of patient's condition, review of old charts and obtaining history from patient or surrogate   Care discussed with: admitting provider  Medications Ordered in ED Medications  lactated ringers bolus 1,000 mL (0 mLs Intravenous Stopped 07/16/22 1723)  lactated ringers bolus 1,000 mL (0 mLs Intravenous Stopped 07/16/22 1828)  iohexol (OMNIPAQUE) 300 MG/ML solution 80 mL (80 mLs Intravenous Contrast Given 07/16/22 1839)  ceFEPIme (MAXIPIME) 2 g in sodium chloride 0.9 % 100 mL IVPB (0 g Intravenous Stopped 07/16/22 2024)    ED Course/ Medical Decision Making/ A&P                          Medical Decision Making Amount and/or Complexity of Data Reviewed Labs: ordered. Decision-making details documented in ED Course. Radiology: ordered. Decision-making details documented in ED Course.  Risk Prescription drug management. Decision regarding hospitalization.    This patient presents to the ED for concern of hypotension, AMS, this involves an extensive number of treatment options, and is a complaint that carries with it a high risk of complications and morbidity.  I considered the following differential and admission for this acute, potentially life threatening condition.   MDM:    For patient's shock consider hypovolemia/hemorrhagic, sepsis.  She was  recently admitted for pyelonephritis and is complaining of back pain currently.  Patient cannot provide history at the moment other than to say that she has back pain.  This is concerning for pyelonephritis, nephrolithiasis/ureterolithiasis, septic stone.  Also per chart review has a history of gastric ulcers but there is no GI bleeding reported, no melena or hematochezia noted in the patient's diaper.  EKG demonstrates no signs of ischemia, she is complaining of no chest pain, lower concern for cardiogenic or obstructive shock.  She is not hypoxic or in any respiratory distress.  Ddx of acute altered mental status or encephalopathy considered but not limited to: -Intracranial abnormalities such as ICH, hydrocephalus, head trauma - she has no head trauma reported by daughter, was otherwise in her NSOH at clinic, no FNDs and she is NCAT on exam. Lower c/f intracranial abnormality espeically given recent similar presentation in s/o UTI/sepsis. Likely the cause  -Toxic ingestion such as opioid overdose, anticholinergic toxicity, -Electrolyte abnormalities or hyper/hypoglycemia -Hypercarbia or hypoxia -Hepatic encephalopathy or uremia -ACS or arrhythmia -Endocrine abnormality such as thyroid storm or myxedema coma   Clinical Course as of 07/18/22 0039  Thu Jul 16, 2022  1631 BP(!): 85/65 Bp imrpoving with fluid [HN]  1814 BP: 130/77 Low BP resolved after fluid resuscitation [HN]  1815 WBC: 7.1 No leukocytosis  [HN]  1815 Urinalysis, w/ Reflex to Culture (Infection Suspected) -Urine, Clean Catch(!) +UTI. Patient complaining of back pain again, likely pyelonephritis. Will get CT to r/o urolithiasis [HN]  1815 Lactic Acid, Venous(!!): 3.4 Prior to fluids [HN]  1815 Creatinine(!): 1.40 [HN]  1816 DG Chest Port 1 View No acute findings [HN]    Clinical Course User Index [HN] Loetta Rough, MD    Labs: I Ordered, and personally interpreted labs.  The pertinent results include: Those listed  above  Imaging Studies ordered: I ordered imaging studies including chest x-ray, CT Abdo pelvis with contrast I independently visualized and interpreted imaging. I agree with the radiologist interpretation  Additional history obtained from chart review, EMS.    Cardiac Monitoring: The patient was maintained on a cardiac monitor.  I personally viewed and interpreted the cardiac monitored which showed an underlying rhythm of: NSR  Reevaluation: After the interventions noted above, I reevaluated the patient and found that they have :improved  Social Determinants of Health: Lives  with her daughter  Disposition:  Patient hemodynamically improved after fluids. CT without any urolithiasis or other intraabdominal emergency. With severe urosepsis, similar to most recent admission. Daughter at bedside is updated. Started on abx and admitted to medicine.   Co morbidities that complicate the patient evaluation  Past Medical History:  Diagnosis Date   CKD (chronic kidney disease)    Dementia (HCC)    Diabetes mellitus    Diastolic heart failure (HCC)    GERD (gastroesophageal reflux disease)    Hypertension    Parkinson disease    Scoliosis    Scoliosis    UTI (lower urinary tract infection)      Medicines Meds ordered this encounter  Medications   lactated ringers bolus 1,000 mL   lactated ringers bolus 1,000 mL   iohexol (OMNIPAQUE) 300 MG/ML solution 80 mL   ceFEPIme (MAXIPIME) 2 g in sodium chloride 0.9 % 100 mL IVPB    Order Specific Question:   Antibiotic Indication:    Answer:   UTI   DISCONTD: ceFEPIme (MAXIPIME) 2 g in sodium chloride 0.9 % 100 mL IVPB    Order Specific Question:   Antibiotic Indication:    Answer:   UTI   DISCONTD: HYDROcodone-acetaminophen (NORCO/VICODIN) 5-325 MG per tablet 1 tablet   morphine (PF) 2 MG/ML injection 1 mg   insulin aspart (novoLOG) injection 0-9 Units    Order Specific Question:   Correction coverage:    Answer:   Sensitive (thin,  NPO, renal)    Order Specific Question:   CBG < 70:    Answer:   Implement Hypoglycemia Standing Orders and refer to Hypoglycemia Standing Orders sidebar report    Order Specific Question:   CBG 70 - 120:    Answer:   0 units    Order Specific Question:   CBG 121 - 150:    Answer:   1 unit    Order Specific Question:   CBG 151 - 200:    Answer:   2 units    Order Specific Question:   CBG 201 - 250:    Answer:   3 units    Order Specific Question:   CBG 251 - 300:    Answer:   5 units    Order Specific Question:   CBG 301 - 350:    Answer:   7 units    Order Specific Question:   CBG 351 - 400    Answer:   9 units    Order Specific Question:   CBG > 400    Answer:   call MD and obtain STAT lab verification   DISCONTD: 0.9 % NaCl with KCl 20 mEq/ L  infusion   OR Linked Order Group    acetaminophen (TYLENOL) tablet 650 mg    acetaminophen (TYLENOL) suppository 650 mg   OR Linked Order Group    ondansetron (ZOFRAN) tablet 4 mg    ondansetron (ZOFRAN) injection 4 mg   polyethylene glycol (MIRALAX / GLYCOLAX) packet 17 g   lactated ringers infusion   furosemide (LASIX) injection 20 mg   cefTRIAXone (ROCEPHIN) 2 g in sodium chloride 0.9 % 100 mL IVPB    Order Specific Question:   Antibiotic Indication:    Answer:   UTI   DISCONTD: pantoprazole (PROTONIX) EC tablet 40 mg   pantoprazole (PROTONIX) injection 40 mg    I have reviewed the patients home medicines and have made adjustments as needed  Problem List / ED Course: Problem List  Items Addressed This Visit       Cardiovascular and Mediastinum   * (Principal) Hypotension - Primary    Hypotension likely from poor oral intake over the past several days. 2/2 esophageal dysphagia.  UA also suggestive of possible UTI.  Portable chest x-ray showing small amount of pleural fluid in left lung base, and hiatal hernia- large.  Hypotension with likely resultant lactic acidosis of 3.4 > 3.  Rules out for sepsis at this time.  Afebrile.  WBC  7.1..  Recent urine culture 04/2022, resistant to Cipro otherwise pansensitive -Continue IV cefepime -2 L bolus given, continue LR 75cc/hr x 1 day -Trend lactic acid -Follow-up urine cultures, blood cultures -Hold antihypertensives carvedilol, and Lasix 20 mg daily      Other Visit Diagnoses     Pyelonephritis       Relevant Medications   ceFEPIme (MAXIPIME) 2 g in sodium chloride 0.9 % 100 mL IVPB (Completed)   cefTRIAXone (ROCEPHIN) 2 g in sodium chloride 0.9 % 100 mL IVPB                   This note was created using dictation software, which may contain spelling or grammatical errors.    Loetta Rough, MD 07/18/22 [phone]

## 2022-07-17 DIAGNOSIS — R000 Tachycardia, unspecified: Secondary | ICD-10-CM

## 2022-07-17 DIAGNOSIS — R1319 Other dysphagia: Secondary | ICD-10-CM | POA: Diagnosis not present

## 2022-07-17 DIAGNOSIS — I959 Hypotension, unspecified: Secondary | ICD-10-CM

## 2022-07-17 DIAGNOSIS — E861 Hypovolemia: Secondary | ICD-10-CM | POA: Diagnosis not present

## 2022-07-17 LAB — GLUCOSE, CAPILLARY
Glucose-Capillary: 131 mg/dL — ABNORMAL HIGH (ref 70–99)
Glucose-Capillary: 150 mg/dL — ABNORMAL HIGH (ref 70–99)
Glucose-Capillary: 170 mg/dL — ABNORMAL HIGH (ref 70–99)
Glucose-Capillary: 87 mg/dL (ref 70–99)
Glucose-Capillary: 91 mg/dL (ref 70–99)

## 2022-07-17 LAB — BASIC METABOLIC PANEL
Anion gap: 7 (ref 5–15)
BUN: 19 mg/dL (ref 8–23)
CO2: 24 mmol/L (ref 22–32)
Calcium: 8.3 mg/dL — ABNORMAL LOW (ref 8.9–10.3)
Chloride: 115 mmol/L — ABNORMAL HIGH (ref 98–111)
Creatinine, Ser: 1.46 mg/dL — ABNORMAL HIGH (ref 0.44–1.00)
GFR, Estimated: 36 mL/min — ABNORMAL LOW (ref >60)
Glucose, Bld: 200 mg/dL — ABNORMAL HIGH (ref 70–99)
Potassium: 3.7 mmol/L (ref 3.5–5.1)
Sodium: 146 mmol/L — ABNORMAL HIGH (ref 135–145)

## 2022-07-17 LAB — LACTIC ACID, PLASMA
Lactic Acid, Venous: 2.7 mmol/L (ref 0.5–1.9)
Lactic Acid, Venous: 1.5 mmol/L (ref 0.5–1.9)

## 2022-07-17 LAB — CBC
HCT: 35.4 % — ABNORMAL LOW (ref 36.0–46.0)
Hemoglobin: 11.9 g/dL — ABNORMAL LOW (ref 12.0–15.0)
MCH: 29 pg (ref 26.0–34.0)
MCHC: 33.6 g/dL (ref 30.0–36.0)
MCV: 86.3 fL (ref 80.0–100.0)
Platelets: 180 10*3/uL (ref 150–400)
RBC: 4.1 MIL/uL (ref 3.87–5.11)
RDW: 19.2 % — ABNORMAL HIGH (ref 11.5–15.5)
WBC: 8.6 10*3/uL (ref 4.0–10.5)
nRBC: 0.2 % (ref 0.0–0.2)

## 2022-07-17 MED ORDER — LACTATED RINGERS IV SOLN: INTRAVENOUS | Status: AC

## 2022-07-17 MED ORDER — ACETAMINOPHEN 325 MG PO TABS
650.0000 mg | ORAL_TABLET | Freq: Four times a day (QID) | ORAL | Status: DC | PRN
  Administered 2022-07-19: 650 mg via ORAL
  Filled 2022-07-17: qty 2

## 2022-07-17 MED ORDER — ONDANSETRON HCL 4 MG/2ML IJ SOLN: 4.0000 mg | Freq: Four times a day (QID) | INTRAMUSCULAR | Status: DC | PRN

## 2022-07-17 MED ORDER — PANTOPRAZOLE SODIUM 40 MG IV SOLR
40.0000 mg | Freq: Two times a day (BID) | INTRAVENOUS | Status: DC
  Administered 2022-07-17 – 2022-07-22 (×11): 40 mg via INTRAVENOUS
  Filled 2022-07-17 (×11): qty 10

## 2022-07-17 MED ORDER — POTASSIUM CHLORIDE IN NACL 20-0.9 MEQ/L-% IV SOLN: INTRAVENOUS | Status: DC

## 2022-07-17 MED ORDER — ONDANSETRON HCL 4 MG PO TABS: 4.0000 mg | ORAL_TABLET | Freq: Four times a day (QID) | ORAL | Status: DC | PRN

## 2022-07-17 MED ORDER — PANTOPRAZOLE SODIUM 40 MG PO TBEC: 40.0000 mg | DELAYED_RELEASE_TABLET | Freq: Two times a day (BID) | ORAL | Status: DC

## 2022-07-17 MED ORDER — ACETAMINOPHEN 650 MG RE SUPP: 650.0000 mg | Freq: Four times a day (QID) | RECTAL | Status: DC | PRN

## 2022-07-17 MED ORDER — POLYETHYLENE GLYCOL 3350 17 G PO PACK: 17.0000 g | PACK | Freq: Every day | ORAL | Status: DC | PRN

## 2022-07-17 MED ORDER — INSULIN ASPART 100 UNIT/ML IJ SOLN
0.0000 [IU] | Freq: Four times a day (QID) | INTRAMUSCULAR | Status: DC
  Administered 2022-07-17: 2 [IU] via SUBCUTANEOUS
  Administered 2022-07-17 – 2022-07-19 (×4): 1 [IU] via SUBCUTANEOUS
  Administered 2022-07-19: 2 [IU] via SUBCUTANEOUS
  Administered 2022-07-21: 1 [IU] via SUBCUTANEOUS
  Administered 2022-07-21: 2 [IU] via SUBCUTANEOUS
  Administered 2022-07-22: 3 [IU] via SUBCUTANEOUS

## 2022-07-17 MED ORDER — SODIUM CHLORIDE 0.9 % IV SOLN
2.0000 g | INTRAVENOUS | Status: DC
  Administered 2022-07-17 – 2022-07-18 (×2): 2 g via INTRAVENOUS
  Filled 2022-07-17 (×2): qty 20

## 2022-07-17 MED ORDER — FUROSEMIDE 10 MG/ML IJ SOLN
20.0000 mg | Freq: Once | INTRAMUSCULAR | Status: AC
  Administered 2022-07-17: 20 mg via INTRAVENOUS
  Filled 2022-07-17: qty 2

## 2022-07-17 NOTE — Assessment & Plan Note (Signed)
Unable to take solids for the past 3 days, but able to drink liquids.  Gagging with solids.  Underwent EGD with dilatation 5/2, showed ulcers also found in [place]. -Remain n.p.o. -CBG q. 8 hourly - Hydrate -GI to see in consult in a.m. -IV Protonix 40 daily

## 2022-07-17 NOTE — Evaluation (Signed)
Clinical/Bedside Swallow Evaluation Patient Details  Name: [name] MRN: [id number] Date of Birth: [birth year]  Today's Date: 07/17/2022 Time: SLP Start Time (ACUTE ONLY): 1515 SLP Stop Time (ACUTE ONLY): 1538 SLP Time Calculation (min) (ACUTE ONLY): 23 min  Past Medical History:  Past Medical History:  Diagnosis Date   CKD (chronic kidney disease)    Dementia (HCC)    Diabetes mellitus    Diastolic heart failure (HCC)    GERD (gastroesophageal reflux disease)    Hypertension    Parkinson disease    Scoliosis    Scoliosis    UTI (lower urinary tract infection)    Past Surgical History:  Past Surgical History:  Procedure Laterality Date   BALLOON DILATION N/A 02/21/2021   Procedure: BALLOON DILATION;  Surgeon: Lanelle Bal, DO;  [facility];  Service: Endoscopy;  Laterality: N/A;   BIOPSY  02/21/2021   Procedure: BIOPSY;  Surgeon: Lanelle Bal, DO;  [facility];  Service: Endoscopy;;   BIOPSY  05/07/2022   Procedure: BIOPSY;  Surgeon: Corbin Ade, MD;  [facility];  Service: Endoscopy;;   boil Left    boil removed from left cheek   ESOPHAGOGASTRODUODENOSCOPY (EGD) WITH PROPOFOL N/A 02/21/2021   Procedure: ESOPHAGOGASTRODUODENOSCOPY (EGD) WITH PROPOFOL;  Surgeon: Lanelle Bal, DO;  [facility];  Service: Endoscopy;  Laterality: N/A;  11:00am   ESOPHAGOGASTRODUODENOSCOPY (EGD) WITH PROPOFOL N/A 05/07/2022   Procedure: ESOPHAGOGASTRODUODENOSCOPY (EGD) WITH PROPOFOL;  Surgeon: Corbin Ade, MD;  [facility];  Service: Endoscopy;  Laterality: N/A;  +/- dilation   EYE SURGERY     Retina Detachment   MALONEY DILATION  05/07/2022   Procedure: MALONEY DILATION;  Surgeon: Corbin Ade, MD;  [facility];  Service: Endoscopy;;   MASS EXCISION N/A 06/27/2014   Procedure: EXCISION SUBMANDIBULAR STONE;  Surgeon: Serena Colonel, MD;  Location: [facility];  Service: ENT;  Laterality: N/A;   sublingual gland  removal  2016   tumor removed from rib     HPI:  [name] is a 82 y.o. female with a history of CKD Stage III, dementia, dysphagia, gastric ulcer, parkinson's and diabetes who presented to the ED via EMS from our GI office due to significant hypotension and tachycardia. Daughter reported patient had been unable to tolerate p.o intake for 4-5 days and was gagging with solids and even water. BSE requested.    MBSS 09/04/20  <<Pt presents with swllowing function to be [facility] today on assessment, however, note some lingual pumping and decreased bolus cohesion with solid textures and occasional flash penetration of thin liquids (with all presenations tsp, cup & straw) without aspiration. Overall note good hyolaryngeal excursion, good laryngeal vestibule closure and adequate laryngeal squeeze. Recommend continue with current diet (Pt reports currently consumes a soft diet because she has misplaced her lower dentures) and thin liquids. Meds are ok whole with liquids; Pt reports difficulty with larger pills recommend break large pills in half or crush if needed and able to do so; also recommend trying with puree if needed. Secondary to known esophageal dysphagia, SLP reinforced esophageal precautions and reviewed with Pt and Pt's daughter. There are no ST f/u needs at this time, thank you for this referral >>    Assessment / Plan / Recommendation  Clinical Impression  Clinical swallowing evaluation completed with Pt's daughter at bedside. Pt had MBSS in 2022 detailed above and underwent recent EGD with dilatation 5/2,  with findings of large hiatal hernia, and 2 large ulcers in the cardia. Pt accepted thin liquid trials and note immediate belching after every sip. No coughing or wet vocal quality noted. Pt accepted ~4 bites of applesauce without gagging or overt s/sx of oropharyngeal dysphagia. Pt continues to report globus sensation. According to Pt's daughter, Pt has diminished appetite and she prepares a  liquid diet for the Pt at home and she eats very little if any puree textures. Communicated with GI NP, Courtney and discussed Pt's presentation of esophageal symptoms at bedside with trials and no noted oropharyngeal symptoms. MBSS does not seem indicated at this time secondary to presentation at bedside, GI agrees. Note Pt continues to be at increased risk for aspiration secondary to esophageal dysphagia. Defer further treatment of esophageal dysphagia to GI. There are no further ST needs noted at this time, our service will sign off. Please re-consult if indicated. Thank you for this referral,  SLP Visit Diagnosis: Dysphagia, unspecified (R13.10)    Aspiration Risk  Risk for inadequate nutrition/hydration    Diet Recommendation Thin liquid    Liquid Administration via: Cup;Straw Medication Administration: Crushed with puree Supervision: Patient able to self feed Compensations: Minimize environmental distractions;Small sips/bites;Slow rate Postural Changes: Seated upright at 90 degrees    Other  Recommendations Oral Care Recommendations: Oral care BID;Oral care QID    Recommendations for follow up therapy are one component of a multi-disciplinary discharge planning process, led by the attending physician.  Recommendations may be updated based on patient status, additional functional criteria and insurance authorization.  Follow up Recommendations No SLP follow up        Swallow Study   General HPI: [name] is a 82 y.o. female with medical history significant for CKD stage III, dementia, esophageal dysphagia, gastric ulcer, Parkinson's, diabetes mellitus Type of Study: Bedside Swallow Evaluation Previous Swallow Assessment: MBSS 08/2020, BSE 05/06/22 Diet Prior to this Study: NPO Temperature Spikes Noted: No Respiratory Status: Room air History of Recent Intubation: No Behavior/Cognition: Alert;Cooperative;Pleasant mood Oral Cavity Assessment: Within Functional Limits;Dried  secretions Oral Care Completed by SLP: Yes Oral Cavity - Dentition: Edentulous Self-Feeding Abilities: Needs assist;Needs set up;Total assist Patient Positioning: Upright in bed Baseline Vocal Quality: Normal Volitional Cough: Strong Volitional Swallow: Able to elicit    Oral/Motor/Sensory Function Overall Oral Motor/Sensory Function: Within functional limits   Ice Chips Ice chips: Within functional limits   Thin Liquid Thin Liquid: Within functional limits    Nectar Thick Nectar Thick Liquid: Not tested   Honey Thick Honey Thick Liquid: Not tested   Puree Puree: Within functional limits   Solid     Solid: Not tested     Adaleah Forget H. Romie Levee, CCC-SLP Speech Language Pathologist  Georgetta Haber 07/17/2022,3:56 PM

## 2022-07-17 NOTE — Assessment & Plan Note (Addendum)
Hypotension likely from poor oral intake over the past several days. 2/2 esophageal dysphagia.  UA also suggestive of possible UTI.  Portable chest x-ray showing small amount of pleural fluid in left lung base, and hiatal hernia- large.  Hypotension with likely resultant lactic acidosis of 3.4 > 3.  Rules out for sepsis at this time.  Afebrile.  WBC 7.1..  Recent urine culture 04/2022, resistant to Cipro otherwise pansensitive -Continue IV cefepime -2 L bolus given, continue LR 75cc/hr x 1 day -Trend lactic acid -Follow-up urine cultures, blood cultures -Hold antihypertensives carvedilol, and Lasix 20 mg daily

## 2022-07-17 NOTE — Progress Notes (Signed)
PROGRESS NOTE    [name]  ZOX:[id number] DOB: 10/02/1940 DOA: 07/16/2022 PCP: [name], MD   Brief Narrative:  [name] is a 82 y.o. female with medical history significant for CKD stage III, dementia, esophageal dysphagia, gastric ulcer, Parkinson's, diabetes mellitus  Patient was sent to the ED from GI office with reports of hypotension with systolic in the 80s. Over the past 3 to 4 days, patient unable to swallow solids, but she has been able to swallow liquids. Patient is bedbound and unable to ambulate.    Recent hospitalization 4/30 to 5/5 for sepsis secondary to pyelonephritis. Also with esophageal dysphagia, underwent EGD with dilatation 5/2, with findings of large hiatal hernia, and 2 large ulcers in the cardia.  On presentation Blood pressure down to 64/49, improved after 2x 1L LR boluses. WBC 7.1.  Lactic acidosis 3.4  > 3 > 2.7, alongside volume resuscitation. 7/11 CT A/P with contrast shows small bilateral pleural effusion, large hiatal hernia. UA with small leukocytes and many bacteria, started IV Cefipime (last pyelo infxn in May was resistant to Cipro). Blood and urine cultures obtained. On Hospital Day 1, [name] wasn't able to provide a clear account of her symptoms, oriented to self and immediate location "bed," but not hospital, or time. Her BP has normalized after boluses and is currently NPO awaiting GI consult for progressive dysphagia.    Assessment & Plan:   Principal Problem:   Hypotension Active Problems:   Diabetes mellitus with stage 3 chronic kidney disease (HCC)   Esophageal dysphagia   Dementia (HCC)   CKD (chronic kidney disease) stage 3, GFR 30-59 ml/min (HCC)   Multiple gastric ulcers  Assessment and Plan: Hypotension Hypotension likely from poor oral intake over the past several days 2/2 esophageal dysphagia. Hypotension has improved this morning after volume resuscitation and mIVF with lactic acidosis trending down 3.4 > 3 >2.7.  Not  concerned for systemic infection with otherwise normal vital signs and no leukocytosis, though appears to have local UTI, as below. BP ranging 109/75-132/102, holding home Carvedilol 3.125mg  BID. Has 2+ pitting edema to tibial plateaus, may resume Lasix 20mg .  - 2 L bolus given, continue LR 75cc/hr x 1 day - Trend lactic acid - Hold antihypertensives carvedilol - Resume home Lasix 20mg  today, IV as pt is currently NPO  Esophageal dysphagia Unable to take solids for the past 3 days, but able to drink liquids. Gagging with solids.  Underwent EGD with dilatation 5/2 of moderate Schatzki ring, large hiatal hernia, 2 large ulcers in cardia. - Remain n.p.o. - CBG q. 8 hourly - GI to see in consult this a.m. - IV Protonix 40 daily   UTI Recent urine culture 04/2022, resistant to Cipro otherwise pansensitive. Watching blood and urine cultures. - Follow urine culture - Continue IV cefepime    CKD (chronic kidney disease) stage 3, GFR 30-59 ml/min (HCC) CKD stage IIIb.  Creatinine stable 1.46.  Dementia (HCC) Per daughter-. Mild to moderate. Has good and bad days, she is able to recognize family, but on bad days her speech is confused. Is Alert and oriented x2 this AM. Will speak with daughter. - Delirium precautions - Will continue to monitor   Diabetes mellitus with stage 3 chronic kidney disease (HCC) On humalog 50-50, 20 - 40U BID at hom.  A1c 8.3. Currently NPO so only have SSI sensitive for now. - Monitor CBG while n.p.o. - Hold home insulin - SSI- S q6h     DVT  prophylaxis: SCDs pending GI consult Code Status: Full Family Communication: Daughter Tamiera [facility]).  Consultants:  GI  Procedures:  None  Antimicrobials:  IV Cefipime initiated 7/11   Subjective: Patient seen and evaluated today with no new acute complaints or concerns. No acute concerns or events noted overnight. [name] not able to provide a clear picture of how she is feeling today. Alert and oriented  x2. Will speak with daughter.  Objective: Vitals:   07/16/22 2216 07/16/22 2228 07/17/22 0241 07/17/22 0508  BP: 127/65  122/81 109/75  Pulse: 90  69 81  Resp: 17  18 16   Temp: 97.7 F (36.5 C)  98 F (36.7 C) 98.1 F (36.7 C)  TempSrc: Oral     SpO2: 100%  100% 97%  Weight:  72.3 kg    Height:  5\' 5"  (1.651 m)      Intake/Output Summary (Last 24 hours) at 07/17/2022 0940 Last data filed at 07/17/2022 0600 Gross per 24 hour  Intake 1509.5 ml  Output 20 ml  Net 1489.5 ml   Filed Weights   07/16/22 2228  Weight: 72.3 kg    Examination:  General exam: Appears calm and comfortable Respiratory system: Clear to auscultation. Respiratory effort normal. Cardiovascular system: S1 & S2 heard, RRR.  Gastrointestinal system: Abdomen is soft, nontender, bowel sounds present Central nervous system: Alert and awake Extremities: 2+ BLE edema Skin: No significant lesions noted Psychiatry: Flat affect.   Data Reviewed: I have personally reviewed following labs and imaging studies  CBC: Recent Labs  Lab 07/16/22 1742 07/17/22 0055  WBC 7.1 8.6  NEUTROABS 5.8  --   HGB 10.8* 11.9*  HCT 31.3* 35.4*  MCV 84.6 86.3  PLT 185 180   Basic Metabolic Panel: Recent Labs  Lab 07/16/22 1742 07/17/22 0055  NA 144 146*  K 3.5 3.7  CL 113* 115*  CO2 23 24  GLUCOSE 216* 200*  BUN 18 19  CREATININE 1.40* 1.46*  CALCIUM 8.0* 8.3*   GFR: Estimated Creatinine Clearance: 30.1 mL/min (A) (by C-G formula based on SCr of 1.46 mg/dL (H)). Liver Function Tests: Recent Labs  Lab 07/16/22 1742  AST 26  ALT 17  ALKPHOS 81  BILITOT 1.1  PROT 4.5*  ALBUMIN <1.5*   No results for input(s): "LIPASE", "AMYLASE" in the last 168 hours. No results for input(s): "AMMONIA" in the last 168 hours. Coagulation Profile: Recent Labs  Lab 07/16/22 1742  INR 1.0   Cardiac Enzymes: No results for input(s): "CKTOTAL", "CKMB", "CKMBINDEX", "TROPONINI" in the last 168 hours. BNP (last 3  results) No results for input(s): "PROBNP" in the last 8760 hours. HbA1C: No results for input(s): "HGBA1C" in the last 72 hours. CBG: Recent Labs  Lab 07/17/22 0029 07/17/22 0609 07/17/22 0715  GLUCAP 170* 150* 131*   Lipid Profile: No results for input(s): "CHOL", "HDL", "LDLCALC", "TRIG", "CHOLHDL", "LDLDIRECT" in the last 72 hours. Thyroid Function Tests: No results for input(s): "TSH", "T4TOTAL", "FREET4", "T3FREE", "THYROIDAB" in the last 72 hours. Anemia Panel: No results for input(s): "VITAMINB12", "FOLATE", "FERRITIN", "TIBC", "IRON", "RETICCTPCT" in the last 72 hours. Sepsis Labs: Recent Labs  Lab 07/16/22 1630 07/16/22 1742 07/17/22 0055  LATICACIDVEN 3.4* 3.0* 2.7*    Recent Results (from the past 240 hour(s))  Culture, blood (Routine x 2)     Status: None (Preliminary result)   Collection Time: 07/16/22  4:15 PM   Specimen: BLOOD  Result Value Ref Range Status   Specimen Description BLOOD BLOOD LEFT  WRIST  Final   Special Requests   Final    BOTTLES DRAWN AEROBIC ONLY Blood Culture results may not be optimal due to an inadequate volume of blood received in culture bottles   Culture   Final    NO GROWTH < 24 HOURS Performed at [facility], [address], [place], [place] [address]    Report Status PENDING  Incomplete  Culture, blood (Routine x 2)     Status: None (Preliminary result)   Collection Time: 07/16/22  4:27 PM   Specimen: BLOOD  Result Value Ref Range Status   Specimen Description BLOOD BLOOD RIGHT ARM  Final   Special Requests   Final    BOTTLES DRAWN AEROBIC ONLY Blood Culture adequate volume   Culture   Final    NO GROWTH < 24 HOURS Performed at [facility], [address] [place], [place] [address]    Report Status PENDING  Incomplete  Resp panel by RT-PCR (RSV, Flu A&B, Covid) Anterior Nasal Swab     Status: None   Collection Time: 07/16/22  7:53 PM   Specimen: Anterior Nasal Swab  Result Value Ref Range Status   SARS Coronavirus 2  by RT PCR NEGATIVE NEGATIVE Final    Comment: (NOTE) SARS-CoV-2 target nucleic acids are NOT DETECTED.  The SARS-CoV-2 RNA is generally detectable in upper respiratory specimens during the acute phase of infection. The lowest concentration of SARS-CoV-2 viral copies this assay can detect is 138 copies/mL. A negative result does not preclude SARS-Cov-2 infection and should not be used as the sole basis for treatment or other patient management decisions. A negative result may occur with  improper specimen collection/handling, submission of specimen other than nasopharyngeal swab, presence of viral mutation(s) within the areas targeted by this assay, and inadequate number of viral copies(<138 copies/mL). A negative result must be combined with clinical observations, patient history, and epidemiological information. The expected result is Negative.  Fact Sheet for Patients:  BloggerCourse.com  Fact Sheet for Healthcare Providers:  SeriousBroker.it  This test is no t yet approved or cleared by the [place] FDA and  has been authorized for detection and/or diagnosis of SARS-CoV-2 by FDA under an Emergency Use Authorization (EUA). This EUA will remain  in effect (meaning this test can be used) for the duration of the COVID-19 declaration under Section 564(b)(1) of the Act, 21 U.S.C.section 360bbb-3(b)(1), unless the authorization is terminated  or revoked sooner.       Influenza A by PCR NEGATIVE NEGATIVE Final   Influenza B by PCR NEGATIVE NEGATIVE Final    Comment: (NOTE) The Xpert Xpress SARS-CoV-2/FLU/RSV plus assay is intended as an aid in the diagnosis of influenza from Nasopharyngeal swab specimens and should not be used as a sole basis for treatment. Nasal washings and aspirates are unacceptable for Xpert Xpress SARS-CoV-2/FLU/RSV testing.  Fact Sheet for Patients: BloggerCourse.com  Fact  Sheet for Healthcare Providers: SeriousBroker.it  This test is not yet approved or cleared by the [place] FDA and has been authorized for detection and/or diagnosis of SARS-CoV-2 by FDA under an Emergency Use Authorization (EUA). This EUA will remain in effect (meaning this test can be used) for the duration of the COVID-19 declaration under Section 564(b)(1) of the Act, 21 U.S.C. section 360bbb-3(b)(1), unless the authorization is terminated or revoked.     Resp Syncytial Virus by PCR NEGATIVE NEGATIVE Final    Comment: (NOTE) Fact Sheet for Patients: BloggerCourse.com  Fact Sheet for Healthcare Providers: SeriousBroker.it  This test is not yet approved or cleared by the [place] and has been authorized for detection and/or diagnosis of SARS-CoV-2 by FDA under an Emergency Use Authorization (EUA). This EUA will remain in effect (meaning this test can be used) for the duration of the COVID-19 declaration under Section 564(b)(1) of the Act, 21 U.S.C. section 360bbb-3(b)(1), unless the authorization is terminated or revoked.  Performed at [facility], [address], [place], [place] [address]          Radiology Studies: CT ABDOMEN PELVIS W CONTRAST  Result Date: 07/16/2022 CLINICAL DATA:  Abdominal/flank pain, stone suspected Sepsis EXAM: CT ABDOMEN AND PELVIS WITH CONTRAST TECHNIQUE: Multidetector CT imaging of the abdomen and pelvis was performed using the standard protocol following bolus administration of intravenous contrast. RADIATION DOSE REDUCTION: This exam was performed according to the departmental dose-optimization program which includes automated exposure control, adjustment of the mA and/or kV according to patient size and/or use of iterative reconstruction technique. CONTRAST:  80mL OMNIPAQUE IOHEXOL 300 MG/ML  SOLN COMPARISON:  04/17/2022 FINDINGS: Lower chest: Small  bilateral pleural effusions. Compressive atelectasis in the lower lobes. Large hiatal hernia. Coronary artery and aortic calcifications. Hepatobiliary: Diffuse fatty infiltration of the liver. Intrahepatic and extrahepatic biliary ductal dilatation is stable since prior study. Pancreas: No focal abnormality or ductal dilatation. Spleen: No focal abnormality.  Normal size. 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 Left adrenal gland unremarkable. Scarring throughout the right kidney. Small cysts in the kidneys bilaterally. No follow-up imaging recommended. No hydronephrosis. Urinary bladder unremarkable. Stomach/Bowel: Left colonic diverticulosis. No active diverticulitis. Stomach and small bowel decompressed, unremarkable. Vascular/Lymphatic: Aortic atherosclerosis. No evidence of aneurysm or adenopathy. Reproductive: Uterus and right ovary unremarkable. 7 cm cyst in the left ovary, unchanged. Other: No free fluid or free air. Musculoskeletal: Degenerative disc disease and facet disease in the lower lumbar spine. No acute bony abnormality. IMPRESSION: Small bilateral pleural effusions. Compressive atelectasis in the lower lobes. Large hiatal hernia. Severe diffuse fatty infiltration of the liver. Stable biliary ductal dilatation. Left colonic diverticulosis.  No active diverticulitis. 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 Stable 7 cm left ovarian cystic lesion. Electronically Signed   By: Charlett Nose M.D.   On: 07/16/2022 19:13   DG Chest Port 1 View  Result Date: 07/16/2022 CLINICAL DATA:  Altered mental status. Hypotension. Sepsis question. EXAM: PORTABLE CHEST 1 VIEW COMPARISON:  05/29/2022 FINDINGS: The heart is normal in size. Large hiatal hernia courses into the left lung base with adjacent chronic atelectasis and small amount of pleural fluid. No new airspace disease. No pulmonary edema. No pneumothorax. Chronic  left shoulder arthropathy. IMPRESSION: Large hiatal hernia with adjacent chronic atelectasis and small amount of pleural fluid at the left lung base. No acute findings. Electronically Signed   By: Narda Rutherford M.D.   On: 07/16/2022 17:35        Scheduled Meds:  insulin aspart  0-9 Units Subcutaneous Q6H   Continuous Infusions:  ceFEPime (MAXIPIME) IV 2 g (07/17/22 0755)   lactated ringers 75 mL/hr at 07/17/22 0032     LOS: 0 days    Time spent: 35 minutes  Signed,  Marcelline Mates, MS4

## 2022-07-17 NOTE — Progress Notes (Signed)
   07/17/22 1439  TOC Brief Assessment  Insurance and Status Reviewed  Patient has primary care physician Yes  Home environment has been reviewed Home  Prior level of function: independent  Prior/Current Home Services No current home services  Social Determinants of Health Reivew SDOH reviewed no interventions necessary  Readmission risk has been reviewed Yes  Transition of care needs no transition of care needs at this time   Admitted with Hypotension - GI consulted DC planning 2-3 days. TOC following.

## 2022-07-17 NOTE — Care Management Obs Status (Signed)
MEDICARE OBSERVATION STATUS NOTIFICATION   Patient Details  Name: [name] MRN: [id number] Date of Birth: 1940-08-13   Medicare Observation Status Notification Given:  Yes    Corey Harold 07/17/2022, 4:24 PM

## 2022-07-17 NOTE — Assessment & Plan Note (Addendum)
ON humalog 50-50, 20 - 40U BID.  A1c 8.3. -Monitor CBG while n.p.o. - Hold home insulin - SSI- S q6h

## 2022-07-17 NOTE — Assessment & Plan Note (Signed)
Per daughter-. Mild to moderate.  Has good and bad days, she is able to recognize family, but on bad days her speech is confused.

## 2022-07-17 NOTE — Progress Notes (Cosign Needed Addendum)
Gastroenterology Progress Note   Referring Provider: No ref. provider found Primary Care Physician:  [name], MD Primary Gastroenterologist:  Dr. Marletta Lor  Patient ID: [name]; [id number]; May 21, 1940    Subjective   Spoke with patients grand daughter at bedside who reported that her aunt stated she had been doing well with liquids but when trying to swallow solids she would begin gagging and once she would drink a liquid behind it she would be fine.   Patient alert and cooperative on exam today however mostly only oriented to self and intermittently place.  And not able to provide much history.  She does state that she feels much better today than she did yesterday.  She denies any abdominal pain.  Attempted to call daughter Josphine Knaub) but was unable to reach her for more info.    Objective   Vital signs in last 24 hours Temp:  [97.6 F (36.4 C)-98.1 F (36.7 C)] 98.1 F (36.7 C) (07/12 0508) Pulse Rate:  [67-130] 81 (07/12 0508) Resp:  [10-23] 16 (07/12 0508) BP: (64-136)/(49-102) 109/75 (07/12 0508) SpO2:  [94 %-100 %] 97 % (07/12 0508) Weight:  [72.3 kg] 72.3 kg (07/11 2228)    Physical Exam General:   Alert and oriented to self only, pleasant Head:  Normocephalic and atraumatic. Eyes:  No icterus, sclera clear. Conjuctiva pink.  Neck:  Supple, without thyromegaly or masses.  Heart:  S1, S2 present, no murmurs noted.  Lungs: Clear to auscultation bilaterally, without wheezing, rales, or rhonchi.  Abdomen:  Bowel sounds present, soft, non-tender, non-distended. No HSM or hernias noted. No rebound or guarding. No masses appreciated  Msk:  Symmetrical without gross deformities. Normal posture. Neurologic:  Alert and oriented to self; Skin:  Warm and dry, intact without significant lesions. Psych:  Alert and cooperative. Normal mood and affect.  Intake/Output from previous day: 07/11 0701 - 07/12 0700 In: 1509.5 [I.V.:409.5; IV Piggyback:1100] Out: 20  [Urine:20] Intake/Output this shift: No intake/output data recorded.  Lab Results  Recent Labs    07/16/22 1742 07/17/22 0055  WBC 7.1 8.6  HGB 10.8* 11.9*  HCT 31.3* 35.4*  PLT 185 180   BMET Recent Labs    07/16/22 1742 07/17/22 0055  NA 144 146*  K 3.5 3.7  CL 113* 115*  CO2 23 24  GLUCOSE 216* 200*  BUN 18 19  CREATININE 1.40* 1.46*  CALCIUM 8.0* 8.3*   LFT Recent Labs    07/16/22 1742  PROT 4.5*  ALBUMIN <1.5*  AST 26  ALT 17  ALKPHOS 81  BILITOT 1.1   PT/INR Recent Labs    07/16/22 1742  LABPROT 13.8  INR 1.0   Hepatitis Panel No results for input(s): "HEPBSAG", "HCVAB", "HEPAIGM", "HEPBIGM" in the last 72 hours.  Studies/Results CT ABDOMEN PELVIS W CONTRAST  Result Date: 07/16/2022 CLINICAL DATA:  Abdominal/flank pain, stone suspected Sepsis EXAM: CT ABDOMEN AND PELVIS WITH CONTRAST TECHNIQUE: Multidetector CT imaging of the abdomen and pelvis was performed using the standard protocol following bolus administration of intravenous contrast. RADIATION DOSE REDUCTION: This exam was performed according to the departmental dose-optimization program which includes automated exposure control, adjustment of the mA and/or kV according to patient size and/or use of iterative reconstruction technique. CONTRAST:  80mL OMNIPAQUE IOHEXOL 300 MG/ML  SOLN COMPARISON:  04/17/2022 FINDINGS: Lower chest: Small bilateral pleural effusions. Compressive atelectasis in the lower lobes. Large hiatal hernia. Coronary artery and aortic calcifications. Hepatobiliary: Diffuse fatty infiltration of the liver. Intrahepatic and extrahepatic  biliary ductal dilatation is stable since prior study. Pancreas: No focal abnormality or ductal dilatation. Spleen: No focal abnormality.  Normal size. 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 Left adrenal gland unremarkable. Scarring throughout the right kidney. Small cysts in  the kidneys bilaterally. No follow-up imaging recommended. No hydronephrosis. Urinary bladder unremarkable. Stomach/Bowel: Left colonic diverticulosis. No active diverticulitis. Stomach and small bowel decompressed, unremarkable. Vascular/Lymphatic: Aortic atherosclerosis. No evidence of aneurysm or adenopathy. Reproductive: Uterus and right ovary unremarkable. 7 cm cyst in the left ovary, unchanged. Other: No free fluid or free air. Musculoskeletal: Degenerative disc disease and facet disease in the lower lumbar spine. No acute bony abnormality. IMPRESSION: Small bilateral pleural effusions. Compressive atelectasis in the lower lobes. Large hiatal hernia. Severe diffuse fatty infiltration of the liver. Stable biliary ductal dilatation. Left colonic diverticulosis.  No active diverticulitis. 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 Stable 7 cm left ovarian cystic lesion. Electronically Signed   By: Charlett Nose M.D.   On: 07/16/2022 19:13   DG Chest Port 1 View  Result Date: 07/16/2022 CLINICAL DATA:  Altered mental status. Hypotension. Sepsis question. EXAM: PORTABLE CHEST 1 VIEW COMPARISON:  05/29/2022 FINDINGS: The heart is normal in size. Large hiatal hernia courses into the left lung base with adjacent chronic atelectasis and small amount of pleural fluid. No new airspace disease. No pulmonary edema. No pneumothorax. Chronic left shoulder arthropathy. IMPRESSION: Large hiatal hernia with adjacent chronic atelectasis and small amount of pleural fluid at the left lung base. No acute findings. Electronically Signed   By: Narda Rutherford M.D.   On: 07/16/2022 17:35    Assessment  82 y.o. female with a history of CKD Stage III, dementia, dysphagia, gastric ulcer, parkinson's and diabetes who presented to the ED via EMS from our GI office due to significant hypotension and tachycardia.  See outpatient office visit note from yesterday 7/11 that will serve as consult note.   Dysphagia: Recent  EGD in May while inpatient which revealed moderate Schatzki ring s/p dilation, large hiatal hernia, and 2 large gastric ulcers with inflamed gastric mucosa and biopsies negative for H. Pylori. Advised repeat EGD in 12 weeks to assess for healing. Daughter reported patient had been unable to tolerate p.o intake for 4-5 days and was gagging with solids and even water.  Symptoms more consistent with oropharyngeal component therefore we will proceed with modified barium swallow study with speech evaluation to help recommend appropriate diet going forward.  Hypotension and tachycardia, concern for infection: Secondary to hypovolemia given poor p.o intake. Concern for sepsis as BP found to be systolic's in 60s on arrival to ED and HR in 130s. Lactic acid initially 3.4. She has received 2L fluid bolus and maintained on IV hydration with LR 75/hr and vitals stable. UA concerning for UTI - culture in process. Blood culture with NGTD. Management per hospitalist.   Plan / Recommendations  Resume home PPI BID N.p.o. until after speech evaluation MBSS with speech evaluation OV after d/c to schedule repeat EGD in August to assess healing of gastric ulcer    LOS: 0 days    07/17/2022, 10:35 AM   Addendum: After discussion with speech will plan for bedside swallow eval first then determine if MBSS needed. Based off further information repeat EGD likely unhelpful.   Brooke Bonito, MSN, FNP-BC, AGACNP-BC [facility]

## 2022-07-17 NOTE — Hospital Course (Addendum)
[name] is a 82 y.o. female with medical history significant for CKD stage III, dementia, esophageal dysphagia, gastric ulcer, Parkinson's, diabetes mellitus who was sent to the ED from GI office with reports of hypotension with systolic in the 80s.   Over the past 3 to 4 days before admission, patient reportedly unable to swallow solids. Patient is bedbound and unable to ambulate.     Recent hospitalization 4/30 to 5/5 for sepsis secondary to pyelonephritis. Also with esophageal dysphagia, underwent EGD with dilatation 5/2, with findings of large hiatal hernia, and 2 large ulcers in the cardia.   On presentation Blood pressure down to 64/49, improved after 2x 1L LR boluses. WBC 7.1.  Lactic acidosis 3.4  > 3 > 2.7, alongside volume resuscitation. 7/11 CT A/P with contrast shows small bilateral pleural effusion, large hiatal hernia. UA with small leukocytes and many bacteria, and completed 3 day course of IV Cefipime (last pyelo infxn in May was resistant to Cipro). Urine culture grew E.coli and Pseudomonas. For most of her hospitalization, [name] wasn't able to provide a clear account of her symptoms, and was oriented to self and immediate location "bed," but not hospital, or time. Her BP normalized after boluses and she is now on full liquid diet per SLP recommendations. GI not planning for EGD unless pt develops bleeding, but has recommended discharge with Omeprazole BID and Carafate QID along with 6-8 small meals each day. PT recommended SNF placement.

## 2022-07-17 NOTE — Plan of Care (Signed)

## 2022-07-17 NOTE — Assessment & Plan Note (Signed)
CKD stage IIIb.  Creatinine stable 1.4.

## 2022-07-18 DIAGNOSIS — Z7982 Long term (current) use of aspirin: Secondary | ICD-10-CM | POA: Diagnosis not present

## 2022-07-18 DIAGNOSIS — K449 Diaphragmatic hernia without obstruction or gangrene: Secondary | ICD-10-CM

## 2022-07-18 DIAGNOSIS — Z7189 Other specified counseling: Secondary | ICD-10-CM | POA: Diagnosis not present

## 2022-07-18 DIAGNOSIS — Z1623 Resistance to quinolones and fluoroquinolones: Secondary | ICD-10-CM | POA: Diagnosis present

## 2022-07-18 DIAGNOSIS — K259 Gastric ulcer, unspecified as acute or chronic, without hemorrhage or perforation: Secondary | ICD-10-CM | POA: Diagnosis not present

## 2022-07-18 DIAGNOSIS — R000 Tachycardia, unspecified: Secondary | ICD-10-CM | POA: Diagnosis not present

## 2022-07-18 DIAGNOSIS — G20A1 Parkinson's disease without dyskinesia, without mention of fluctuations: Secondary | ICD-10-CM | POA: Diagnosis present

## 2022-07-18 DIAGNOSIS — E876 Hypokalemia: Secondary | ICD-10-CM | POA: Diagnosis present

## 2022-07-18 DIAGNOSIS — E872 Acidosis, unspecified: Secondary | ICD-10-CM | POA: Diagnosis present

## 2022-07-18 DIAGNOSIS — I130 Hypertensive heart and chronic kidney disease with heart failure and stage 1 through stage 4 chronic kidney disease, or unspecified chronic kidney disease: Secondary | ICD-10-CM | POA: Diagnosis present

## 2022-07-18 DIAGNOSIS — B9620 Unspecified Escherichia coli [E. coli] as the cause of diseases classified elsewhere: Secondary | ICD-10-CM | POA: Diagnosis present

## 2022-07-18 DIAGNOSIS — B965 Pseudomonas (aeruginosa) (mallei) (pseudomallei) as the cause of diseases classified elsewhere: Secondary | ICD-10-CM | POA: Diagnosis present

## 2022-07-18 DIAGNOSIS — E1122 Type 2 diabetes mellitus with diabetic chronic kidney disease: Secondary | ICD-10-CM | POA: Diagnosis present

## 2022-07-18 DIAGNOSIS — N1832 Chronic kidney disease, stage 3b: Secondary | ICD-10-CM | POA: Diagnosis present

## 2022-07-18 DIAGNOSIS — Z66 Do not resuscitate: Secondary | ICD-10-CM | POA: Diagnosis present

## 2022-07-18 DIAGNOSIS — N390 Urinary tract infection, site not specified: Secondary | ICD-10-CM | POA: Diagnosis present

## 2022-07-18 DIAGNOSIS — Z1152 Encounter for screening for COVID-19: Secondary | ICD-10-CM | POA: Diagnosis not present

## 2022-07-18 DIAGNOSIS — F03B0 Unspecified dementia, moderate, without behavioral disturbance, psychotic disturbance, mood disturbance, and anxiety: Secondary | ICD-10-CM | POA: Diagnosis not present

## 2022-07-18 DIAGNOSIS — K219 Gastro-esophageal reflux disease without esophagitis: Secondary | ICD-10-CM | POA: Diagnosis present

## 2022-07-18 DIAGNOSIS — E861 Hypovolemia: Secondary | ICD-10-CM | POA: Diagnosis present

## 2022-07-18 DIAGNOSIS — R1319 Other dysphagia: Secondary | ICD-10-CM | POA: Diagnosis not present

## 2022-07-18 DIAGNOSIS — Z794 Long term (current) use of insulin: Secondary | ICD-10-CM | POA: Diagnosis not present

## 2022-07-18 DIAGNOSIS — I959 Hypotension, unspecified: Secondary | ICD-10-CM | POA: Diagnosis not present

## 2022-07-18 DIAGNOSIS — G2111 Neuroleptic induced parkinsonism: Secondary | ICD-10-CM | POA: Diagnosis present

## 2022-07-18 DIAGNOSIS — M419 Scoliosis, unspecified: Secondary | ICD-10-CM | POA: Diagnosis present

## 2022-07-18 DIAGNOSIS — N12 Tubulo-interstitial nephritis, not specified as acute or chronic: Secondary | ICD-10-CM | POA: Diagnosis present

## 2022-07-18 DIAGNOSIS — R1310 Dysphagia, unspecified: Secondary | ICD-10-CM | POA: Diagnosis not present

## 2022-07-18 DIAGNOSIS — R1314 Dysphagia, pharyngoesophageal phase: Secondary | ICD-10-CM | POA: Diagnosis present

## 2022-07-18 DIAGNOSIS — Z515 Encounter for palliative care: Secondary | ICD-10-CM | POA: Diagnosis not present

## 2022-07-18 DIAGNOSIS — F0280 Dementia in other diseases classified elsewhere without behavioral disturbance: Secondary | ICD-10-CM | POA: Diagnosis present

## 2022-07-18 DIAGNOSIS — I9589 Other hypotension: Secondary | ICD-10-CM | POA: Diagnosis present

## 2022-07-18 DIAGNOSIS — I5032 Chronic diastolic (congestive) heart failure: Secondary | ICD-10-CM | POA: Diagnosis present

## 2022-07-18 LAB — CBC
HCT: 29.6 % — ABNORMAL LOW (ref 36.0–46.0)
Hemoglobin: 10.3 g/dL — ABNORMAL LOW (ref 12.0–15.0)
MCH: 29.1 pg (ref 26.0–34.0)
MCHC: 34.8 g/dL (ref 30.0–36.0)
MCV: 83.6 fL (ref 80.0–100.0)
Platelets: 184 10*3/uL (ref 150–400)
RBC: 3.54 MIL/uL — ABNORMAL LOW (ref 3.87–5.11)
RDW: 18.3 % — ABNORMAL HIGH (ref 11.5–15.5)
WBC: 7.5 10*3/uL (ref 4.0–10.5)
nRBC: 0.4 % — ABNORMAL HIGH (ref 0.0–0.2)

## 2022-07-18 LAB — COMPREHENSIVE METABOLIC PANEL
ALT: 14 U/L (ref 0–44)
AST: 17 U/L (ref 15–41)
Albumin: <1.5 g/dL — ABNORMAL LOW (ref 3.5–5.0)
Alkaline Phosphatase: 76 U/L (ref 38–126)
Anion gap: 9 (ref 5–15)
BUN: 20 mg/dL (ref 8–23)
CO2: 21 mmol/L — ABNORMAL LOW (ref 22–32)
Calcium: 7.9 mg/dL — ABNORMAL LOW (ref 8.9–10.3)
Chloride: 114 mmol/L — ABNORMAL HIGH (ref 98–111)
Creatinine, Ser: 1.4 mg/dL — ABNORMAL HIGH (ref 0.44–1.00)
GFR, Estimated: 38 mL/min — ABNORMAL LOW (ref >60)
Glucose, Bld: 120 mg/dL — ABNORMAL HIGH (ref 70–99)
Potassium: 3.1 mmol/L — ABNORMAL LOW (ref 3.5–5.1)
Sodium: 144 mmol/L (ref 135–145)
Total Bilirubin: 0.9 mg/dL (ref 0.3–1.2)
Total Protein: 4.3 g/dL — ABNORMAL LOW (ref 6.5–8.1)

## 2022-07-18 LAB — GLUCOSE, CAPILLARY
Glucose-Capillary: 108 mg/dL — ABNORMAL HIGH (ref 70–99)
Glucose-Capillary: 112 mg/dL — ABNORMAL HIGH (ref 70–99)
Glucose-Capillary: 120 mg/dL — ABNORMAL HIGH (ref 70–99)
Glucose-Capillary: 124 mg/dL — ABNORMAL HIGH (ref 70–99)
Glucose-Capillary: 93 mg/dL (ref 70–99)

## 2022-07-18 LAB — MAGNESIUM: Magnesium: 1.6 mg/dL — ABNORMAL LOW (ref 1.7–2.4)

## 2022-07-18 LAB — URINE CULTURE

## 2022-07-18 MED ORDER — MAGNESIUM SULFATE 2 GM/50ML IV SOLN
2.0000 g | Freq: Once | INTRAVENOUS | Status: AC
  Administered 2022-07-18: 2 g via INTRAVENOUS
  Filled 2022-07-18: qty 50

## 2022-07-18 MED ORDER — POTASSIUM CHLORIDE 10 MEQ/100ML IV SOLN
10.0000 meq | INTRAVENOUS | Status: AC
  Administered 2022-07-18 (×4): 10 meq via INTRAVENOUS
  Filled 2022-07-18 (×4): qty 100

## 2022-07-18 MED ORDER — LACTATED RINGERS IV SOLN: INTRAVENOUS | Status: AC

## 2022-07-18 NOTE — Evaluation (Signed)
Physical Therapy Evaluation Patient Details Name: [name] MRN: [id number] DOB: December 13, 1940 Today's Date: 07/18/2022  History of Present Illness  [name] is a 82 y.o. female with medical history significant for CKD stage III, dementia, esophageal dysphagia, gastric ulcer, Parkinson's, diabetes mellitus.  Patient was sent to the ED from GI office with reports of hypotension.  Systolic in the 80s.  On my evaluation, patient is awake alert oriented to person and place and to some extent situation.  She is able to answer simple questions, but her daughter [name] at bedside provides the history.    Over the past 3 to 4 days, patient's with When she tried to swallow solids, but she has been able to swallow liquids.  Patient is bedbound and unable to ambulate.  No vomiting.  No loose stools.  Patient denies difficulty breathing, no chest pain, she denies pain with urination.   Clinical Impression  Patient appears to be a poor historian and is unable to provide info about PLOF/living arrangements. Patient limited for functional mobility as stated below secondary to BLE weakness, fatigue and poor sitting balance. Patient requires mod/max assist for LE mobility and to upright trunk to transition to seated EOB. She demonstrates poor sitting balance and sitting tolerance with quick fatigue. Patient assisted back to supine at end of session. Patient will benefit from continued physical therapy in hospital and recommended venue below to increase strength, balance, endurance for safe ADLs and gait.         Assistance Recommended at Discharge Frequent or constant Supervision/Assistance  If plan is discharge home, recommend the following:  Can travel by private vehicle  A lot of help with walking and/or transfers;A lot of help with bathing/dressing/bathroom   No    Equipment Recommendations None recommended by PT  Recommendations for Other Services       Functional Status Assessment Patient has  had a recent decline in their functional status and demonstrates the ability to make significant improvements in function in a reasonable and predictable amount of time.     Precautions / Restrictions Precautions Precautions: Fall Restrictions Weight Bearing Restrictions: No      Mobility  Bed Mobility Overal bed mobility: Needs Assistance Bed Mobility: Supine to Sit, Sit to Supine     Supine to sit: Mod assist, Max assist, HOB elevated Sit to supine: Mod assist, Max assist, HOB elevated   General bed mobility comments: assist for LE movement and to upright trunk to transition to seated EOB    Transfers                        Ambulation/Gait                  Stairs            Wheelchair Mobility     Tilt Bed    Modified Rankin (Stroke Patients Only)       Balance Overall balance assessment: Needs assistance   Sitting balance-Leahy Scale: Poor Sitting balance - Comments: seated EOB                                     Pertinent Vitals/Pain Pain Assessment Pain Assessment: No/denies pain    Home Living Family/patient expects to be discharged to:: Private residence Living Arrangements: Children  Home Equipment: Rollator (4 wheels);Cane - single point;BSC/3in1 Additional Comments: info from chart as patient is a poor historian    Prior Function Prior Level of Function : Patient poor historian/Family not available                     Hand Dominance        Extremity/Trunk Assessment   Upper Extremity Assessment Upper Extremity Assessment: Generalized weakness    Lower Extremity Assessment Lower Extremity Assessment: Generalized weakness    Cervical / Trunk Assessment Cervical / Trunk Assessment: Kyphotic  Communication      Cognition Arousal/Alertness: Awake/alert Behavior During Therapy: WFL for tasks assessed/performed Overall Cognitive Status: Within Functional Limits for  tasks assessed                                          General Comments      Exercises     Assessment/Plan    PT Assessment Patient needs continued PT services  PT Problem List Decreased strength;Decreased activity tolerance;Decreased balance;Decreased mobility;Decreased cognition       PT Treatment Interventions DME instruction;Balance training;Gait training;Neuromuscular re-education;Stair training;Functional mobility training;Patient/family education;Therapeutic activities;Therapeutic exercise;Manual techniques    PT Goals (Current goals can be found in the Care Plan section)  Acute Rehab PT Goals Patient Stated Goal: none stated PT Goal Formulation: Patient unable to participate in goal setting Time For Goal Achievement: 08/01/22 Potential to Achieve Goals: Fair    Frequency Min 3X/week     Co-evaluation               AM-PAC PT "6 Clicks" Mobility  Outcome Measure Help needed turning from your back to your side while in a flat bed without using bedrails?: A Lot Help needed moving from lying on your back to sitting on the side of a flat bed without using bedrails?: A Lot Help needed moving to and from a bed to a chair (including a wheelchair)?: A Lot Help needed standing up from a chair using your arms (e.g., wheelchair or bedside chair)?: A Lot Help needed to walk in hospital room?: A Lot Help needed climbing 3-5 steps with a railing? : A Lot 6 Click Score: 12    End of Session   Activity Tolerance: Patient limited by fatigue Patient left: in bed;with call bell/phone within reach;with bed alarm set Nurse Communication: Mobility status PT Visit Diagnosis: Other abnormalities of gait and mobility (R26.89);Muscle weakness (generalized) (M62.81)    Time: 1000-1007 PT Time Calculation (min) (ACUTE ONLY): 7 min   Charges:   PT Evaluation $PT Eval Low Complexity: 1 Low   PT General Charges $$ ACUTE PT VISIT: 1 Visit         10:44  AM, 07/18/22 Wyman Songster PT, DPT Physical Therapist at [facility]

## 2022-07-18 NOTE — Progress Notes (Signed)
Subjective: Patient clinically doing about the same.  Tolerating liquid diet.  Objective: Vital signs in last 24 hours: Temp:  [97.9 F (36.6 C)-98.5 F (36.9 C)] 97.9 F (36.6 C) (07/13 0458) Pulse Rate:  [74-82] 82 (07/13 0458) Resp:  [16-20] 20 (07/13 0458) BP: (110-117)/(67-76) 117/70 (07/13 0458) SpO2:  [98 %-100 %] 100 % (07/13 0458)   General:   Alert and oriented, pleasant Head:  Normocephalic and atraumatic. Eyes:  No icterus, sclera clear. Conjuctiva pink.  Abdomen:  Bowel sounds present, soft, non-tender, non-distended. No HSM or hernias noted. No rebound or guarding. No masses appreciated  Msk:  Symmetrical without gross deformities. Normal posture. Extremities:  Without clubbing or edema. Neurologic:  Alert and  oriented x4;  grossly normal neurologically. Skin:  Warm and dry, intact without significant lesions.  Cervical Nodes:  No significant cervical adenopathy. Psych:  Alert and cooperative. Normal mood and affect.  Intake/Output from previous day: 07/12 0701 - 07/13 0700 In: 120 [P.O.:120] Out: 1100 [Urine:1100] Intake/Output this shift: No intake/output data recorded.  Lab Results: Recent Labs    07/16/22 1742 07/17/22 0055 07/18/22 0516  WBC 7.1 8.6 7.5  HGB 10.8* 11.9* 10.3*  HCT 31.3* 35.4* 29.6*  PLT 185 180 184   BMET Recent Labs    07/16/22 1742 07/17/22 0055 07/18/22 0516  NA 144 146* 144  K 3.5 3.7 3.1*  CL 113* 115* 114*  CO2 23 24 21*  GLUCOSE 216* 200* 120*  BUN 18 19 20   CREATININE 1.40* 1.46* 1.40*  CALCIUM 8.0* 8.3* 7.9*   LFT Recent Labs    07/16/22 1742 07/18/22 0516  PROT 4.5* 4.3*  ALBUMIN <1.5* <1.5*  AST 26 17  ALT 17 14  ALKPHOS 81 76  BILITOT 1.1 0.9   PT/INR Recent Labs    07/16/22 1742  LABPROT 13.8  INR 1.0   Hepatitis Panel No results for input(s): "HEPBSAG", "HCVAB", "HEPAIGM", "HEPBIGM" in the last 72 hours.   Studies/Results: CT ABDOMEN PELVIS W CONTRAST  Result Date:  07/16/2022 CLINICAL DATA:  Abdominal/flank pain, stone suspected Sepsis EXAM: CT ABDOMEN AND PELVIS WITH CONTRAST TECHNIQUE: Multidetector CT imaging of the abdomen and pelvis was performed using the standard protocol following bolus administration of intravenous contrast. RADIATION DOSE REDUCTION: This exam was performed according to the departmental dose-optimization program which includes automated exposure control, adjustment of the mA and/or kV according to patient size and/or use of iterative reconstruction technique. CONTRAST:  80mL OMNIPAQUE IOHEXOL 300 MG/ML  SOLN COMPARISON:  04/17/2022 FINDINGS: Lower chest: Small bilateral pleural effusions. Compressive atelectasis in the lower lobes. Large hiatal hernia. Coronary artery and aortic calcifications. Hepatobiliary: Diffuse fatty infiltration of the liver. Intrahepatic and extrahepatic biliary ductal dilatation is stable since prior study. Pancreas: No focal abnormality or ductal dilatation. Spleen: No focal abnormality.  Normal size. 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 Left adrenal gland unremarkable. Scarring throughout the right kidney. Small cysts in the kidneys bilaterally. No follow-up imaging recommended. No hydronephrosis. Urinary bladder unremarkable. Stomach/Bowel: Left colonic diverticulosis. No active diverticulitis. Stomach and small bowel decompressed, unremarkable. Vascular/Lymphatic: Aortic atherosclerosis. No evidence of aneurysm or adenopathy. Reproductive: Uterus and right ovary unremarkable. 7 cm cyst in the left ovary, unchanged. Other: No free fluid or free air. Musculoskeletal: Degenerative disc disease and facet disease in the lower lumbar spine. No acute bony abnormality. IMPRESSION: Small bilateral pleural effusions. Compressive atelectasis in the lower lobes. Large hiatal hernia. Severe diffuse  fatty infiltration of the liver. Stable biliary ductal  dilatation. Left colonic diverticulosis.  No active diverticulitis. 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 Stable 7 cm left ovarian cystic lesion. Electronically Signed   By: Charlett Nose M.D.   On: 07/16/2022 19:13   DG Chest Port 1 View  Result Date: 07/16/2022 CLINICAL DATA:  Altered mental status. Hypotension. Sepsis question. EXAM: PORTABLE CHEST 1 VIEW COMPARISON:  05/29/2022 FINDINGS: The heart is normal in size. Large hiatal hernia courses into the left lung base with adjacent chronic atelectasis and small amount of pleural fluid. No new airspace disease. No pulmonary edema. No pneumothorax. Chronic left shoulder arthropathy. IMPRESSION: Large hiatal hernia with adjacent chronic atelectasis and small amount of pleural fluid at the left lung base. No acute findings. Electronically Signed   By: Narda Rutherford M.D.   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 Resume liquid diet.   Family educated on 6-8 small meals daily. Difficult situation with large hiatal hernia, Cameron ulcers. Patient not a surgical candidate for hernia repair. Maximize acid reduction with PPI twice daily. Liquid Carafate 4 times daily.   Do not think she needs repeat EGD at this time unless has evidence of bleeding.  Discussed case further with speech therapy who is in agreement.  Otherwise supportive care.    Consider switching to omeprazole twice daily upon discharge as patient family states she did better on this. Unsure if she was actually receiving her Pantoprazole BID at home. Family educated on crushing pills or if using omeprazole to open capsule contents and mix with soft food.  Hennie Duos. Marletta Lor, D.O. Gastroenterology and Hepatology [facility]   LOS: 0 days    07/18/2022, 10:19 AM

## 2022-07-18 NOTE — Plan of Care (Signed)
  Problem: Acute Rehab PT Goals(only PT should resolve) Goal: Pt Will Go Supine/Side To Sit Outcome: Progressing Flowsheets (Taken 07/18/2022 1044) Pt will go Supine/Side to Sit: with moderate assist Goal: Pt Will Go Sit To Supine/Side Outcome: Progressing Flowsheets (Taken 07/18/2022 1044) Pt will go Sit to Supine/Side: with moderate assist Goal: Pt Will Transfer Bed To Chair/Chair To Bed Outcome: Progressing Flowsheets (Taken 07/18/2022 1044) Pt will Transfer Bed to Chair/Chair to Bed: with mod assist  10:45 AM, 07/18/22 Wyman Songster PT, DPT Physical Therapist at [facility]

## 2022-07-18 NOTE — Progress Notes (Signed)
PROGRESS NOTE    [name]  ZOX:[id number] DOB: 10-01-1940 DOA: 07/16/2022 PCP: [name], MD   Brief Narrative:  [name] is a 82 y.o. female with medical history significant for CKD stage III, dementia, esophageal dysphagia, gastric ulcer, Parkinson's, diabetes mellitus   Patient was sent to the ED from GI office with reports of hypotension with systolic in the 80s. Over the past 3 to 4 days, patient unable to swallow solids, but she has been able to swallow liquids. Patient is bedbound and unable to ambulate.    Recent hospitalization 4/30 to 5/5 for sepsis secondary to pyelonephritis. Also with esophageal dysphagia, underwent EGD with dilatation 5/2, with findings of large hiatal hernia, and 2 large ulcers in the cardia.   On presentation Blood pressure down to 64/49, improved after 2x 1L LR boluses. WBC 7.1.  Lactic acidosis 3.4  > 3 > 2.7, alongside volume resuscitation. 7/11 CT A/P with contrast shows small bilateral pleural effusion, large hiatal hernia. UA with small leukocytes and many bacteria, started IV Cefipime (last pyelo infxn in May was resistant to Cipro). Blood and urine cultures obtained. On Hospital Day 1, Ms Makhoul wasn't able to provide a clear account of her symptoms, oriented to self and immediate location "bed," but not hospital, or time. Her BP has normalized after boluses and he is now on full liquid diet per SLP recommendations.  GI planning for EGD outpatient.   Assessment & Plan:   Principal Problem:   Hypotension Active Problems:   Esophageal dysphagia   Diabetes mellitus with stage 3 chronic kidney disease (HCC)   Dementia (HCC)   CKD (chronic kidney disease) stage 3, GFR 30-59 ml/min (HCC)   Multiple gastric ulcers  Assessment and Plan: Hypotension-resolved Hypotension likely from poor oral intake over the past several days 2/2 esophageal dysphagia. Hypotension has improved this morning after volume resuscitation and mIVF with lactic acidosis  trending down 3.4 > 3 >2.7.  Not concerned for systemic infection with otherwise normal vital signs and no leukocytosis, though appears to have local UTI, as below. BP ranging 109/75-132/102, holding home Carvedilol 3.125mg  BID. Has 2+ pitting edema to tibial plateaus, may resume Lasix 20mg .  -Continue to hold carvedilol and Lasix until further improving oral intake  Esophageal dysphagia Unable to take solids for the past 3 days, but able to drink liquids. Gagging with solids.  Underwent EGD with dilatation 5/2 of moderate Schatzki ring, large hiatal hernia, 2 large ulcers in cardia. - Remain n.p.o. - CBG q. 8 hourly - GI does not plan for EGD at the moment, SLP recommending full liquid diet which she continues at home - IV Protonix 40 daily   GNR UTI Recent urine culture 04/2022, resistant to Cipro otherwise pansensitive. Watching blood and urine cultures. - Follow urine culture - Continue IV cefepime    Hypokalemia/hypomagnesemia -Replete and reevaluate in a.m.  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  Creatinine stable 1.46.  Dementia (HCC) Per daughter-. Mild to moderate. Has good and bad days, she is able to recognize family, but on bad days her speech is confused. Is Alert and oriented x2 this AM. Will speak with daughter. - Delirium precautions - Will continue to monitor -Appreciate PT evaluation for severe weakness with possible need for SNF -Appreciate palliative care consultation for goals of care, she appears to be active with palliative outpatient   Diabetes mellitus with stage 3 chronic kidney disease (HCC) On humalog 50-50, 20 -  40U BID at hom.  A1c 8.3. Currently NPO so only have SSI sensitive for now. - Monitor CBG while n.p.o. - Hold home insulin - SSI- S q6h   DVT prophylaxis: SCDs  Code Status:DNR Family Communication: Daughter Uri Naragon [facility]).  Consultants:  GI Palliative Care  Procedures:  None  Antimicrobials:   IV Cefipime initiated 7/11   Subjective: Patient seen and evaluated today with no new acute complaints or concerns.  She appears overall confused and does have history of dementia.  She appears to be tolerating full liquid diet.  Objective: Vitals:   07/17/22 1306 07/17/22 2048 07/18/22 0118 07/18/22 0458  BP: 110/76 117/67  117/70  Pulse: 79 74  82  Resp:  16 18 20   Temp: 98.5 F (36.9 C) 97.9 F (36.6 C)  97.9 F (36.6 C)  TempSrc: Oral Oral    SpO2: 98% 100%  100%  Weight:      Height:        Intake/Output Summary (Last 24 hours) at 07/18/2022 0923 Last data filed at 07/18/2022 0500 Gross per 24 hour  Intake 120 ml  Output 1100 ml  Net -980 ml   Filed Weights   07/16/22 2228  Weight: 72.3 kg    Examination:  General exam: Appears calm and comfortable, confused Respiratory system: Clear to auscultation. Respiratory effort normal. Cardiovascular system: S1 & S2 heard, RRR.  Gastrointestinal system: Abdomen is soft, nontender, bowel sounds present Central nervous system: Alert and awake, confused at baseline Extremities: 2+ BLE edema Skin: No significant lesions noted Psychiatry: Flat affect.   Data Reviewed: I have personally reviewed following labs and imaging studies  CBC: Recent Labs  Lab 07/16/22 1742 07/17/22 0055 07/18/22 0516  WBC 7.1 8.6 7.5  NEUTROABS 5.8  --   --   HGB 10.8* 11.9* 10.3*  HCT 31.3* 35.4* 29.6*  MCV 84.6 86.3 83.6  PLT 185 180 184   Basic Metabolic Panel: Recent Labs  Lab 07/16/22 1742 07/17/22 0055 07/18/22 0516  NA 144 146* 144  K 3.5 3.7 3.1*  CL 113* 115* 114*  CO2 23 24 21*  GLUCOSE 216* 200* 120*  BUN 18 19 20   CREATININE 1.40* 1.46* 1.40*  CALCIUM 8.0* 8.3* 7.9*  MG  --   --  1.6*   GFR: Estimated Creatinine Clearance: 31.4 mL/min (A) (by C-G formula based on SCr of 1.4 mg/dL (H)). Liver Function Tests: Recent Labs  Lab 07/16/22 1742 07/18/22 0516  AST 26 17  ALT 17 14  ALKPHOS 81 76  BILITOT 1.1  0.9  PROT 4.5* 4.3*  ALBUMIN <1.5* <1.5*   No results for input(s): "LIPASE", "AMYLASE" in the last 168 hours. No results for input(s): "AMMONIA" in the last 168 hours. Coagulation Profile: Recent Labs  Lab 07/16/22 1742  INR 1.0   Cardiac Enzymes: No results for input(s): "CKTOTAL", "CKMB", "CKMBINDEX", "TROPONINI" in the last 168 hours. BNP (last 3 results) No results for input(s): "PROBNP" in the last 8760 hours. HbA1C: No results for input(s): "HGBA1C" in the last 72 hours. CBG: Recent Labs  Lab 07/17/22 1106 07/17/22 1610 07/18/22 0019 07/18/22 0603 07/18/22 0731  GLUCAP 87 91 93 108* 112*   Lipid Profile: No results for input(s): "CHOL", "HDL", "LDLCALC", "TRIG", "CHOLHDL", "LDLDIRECT" in the last 72 hours. Thyroid Function Tests: No results for input(s): "TSH", "T4TOTAL", "FREET4", "T3FREE", "THYROIDAB" in the last 72 hours. Anemia Panel: No results for input(s): "VITAMINB12", "FOLATE", "FERRITIN", "TIBC", "IRON", "RETICCTPCT" in the last 72 hours. Sepsis Labs:  Recent Labs  Lab 07/16/22 1630 07/16/22 1742 07/17/22 0055 07/17/22 1221  LATICACIDVEN 3.4* 3.0* 2.7* 1.5    Recent Results (from the past 240 hour(s))  Culture, blood (Routine x 2)     Status: None (Preliminary result)   Collection Time: 07/16/22  4:15 PM   Specimen: BLOOD  Result Value Ref Range Status   Specimen Description BLOOD BLOOD LEFT WRIST  Final   Special Requests   Final    BOTTLES DRAWN AEROBIC ONLY Blood Culture results may not be optimal due to an inadequate volume of blood received in culture bottles   Culture   Final    NO GROWTH 2 DAYS Performed at [facility], [address], [place], [place] [address]    Report Status PENDING  Incomplete  Culture, blood (Routine x 2)     Status: None (Preliminary result)   Collection Time: 07/16/22  4:27 PM   Specimen: BLOOD  Result Value Ref Range Status   Specimen Description BLOOD BLOOD RIGHT ARM  Final   Special Requests   Final     BOTTLES DRAWN AEROBIC ONLY Blood Culture adequate volume   Culture   Final    NO GROWTH 2 DAYS Performed at [facility], [address], [place], [place] [address]    Report Status PENDING  Incomplete  Resp panel by RT-PCR (RSV, Flu A&B, Covid) Anterior Nasal Swab     Status: None   Collection Time: 07/16/22  7:53 PM   Specimen: Anterior Nasal Swab  Result Value Ref Range Status   SARS Coronavirus 2 by RT PCR NEGATIVE NEGATIVE Final    Comment: (NOTE) SARS-CoV-2 target nucleic acids are NOT DETECTED.  The SARS-CoV-2 RNA is generally detectable in upper respiratory specimens during the acute phase of infection. The lowest concentration of SARS-CoV-2 viral copies this assay can detect is 138 copies/mL. A negative result does not preclude SARS-Cov-2 infection and should not be used as the sole basis for treatment or other patient management decisions. A negative result may occur with  improper specimen collection/handling, submission of specimen other than nasopharyngeal swab, presence of viral mutation(s) within the areas targeted by this assay, and inadequate number of viral copies(<138 copies/mL). A negative result must be combined with clinical observations, patient history, and epidemiological information. The expected result is Negative.  Fact Sheet for Patients:  BloggerCourse.com  Fact Sheet for Healthcare Providers:  SeriousBroker.it  This test is no t yet approved or cleared by the [place] FDA and  has been authorized for detection and/or diagnosis of SARS-CoV-2 by FDA under an Emergency Use Authorization (EUA). This EUA will remain  in effect (meaning this test can be used) for the duration of the COVID-19 declaration under Section 564(b)(1) of the Act, 21 U.S.C.section 360bbb-3(b)(1), unless the authorization is terminated  or revoked sooner.       Influenza A by PCR NEGATIVE NEGATIVE Final   Influenza B by  PCR NEGATIVE NEGATIVE Final    Comment: (NOTE) The Xpert Xpress SARS-CoV-2/FLU/RSV plus assay is intended as an aid in the diagnosis of influenza from Nasopharyngeal swab specimens and should not be used as a sole basis for treatment. Nasal washings and aspirates are unacceptable for Xpert Xpress SARS-CoV-2/FLU/RSV testing.  Fact Sheet for Patients: BloggerCourse.com  Fact Sheet for Healthcare Providers: SeriousBroker.it  This test is not yet approved or cleared by the [place] FDA and has been authorized for detection and/or diagnosis of SARS-CoV-2 by FDA under an Emergency Use Authorization (EUA). This  EUA will remain in effect (meaning this test can be used) for the duration of the COVID-19 declaration under Section 564(b)(1) of the Act, 21 U.S.C. section 360bbb-3(b)(1), unless the authorization is terminated or revoked.     Resp Syncytial Virus by PCR NEGATIVE NEGATIVE Final    Comment: (NOTE) Fact Sheet for Patients: BloggerCourse.com  Fact Sheet for Healthcare Providers: SeriousBroker.it  This test is not yet approved or cleared by the [place] FDA and has been authorized for detection and/or diagnosis of SARS-CoV-2 by FDA under an Emergency Use Authorization (EUA). This EUA will remain in effect (meaning this test can be used) for the duration of the COVID-19 declaration under Section 564(b)(1) of the Act, 21 U.S.C. section 360bbb-3(b)(1), unless the authorization is terminated or revoked.  Performed at [facility], [address], [place], [place] [address]          Radiology Studies: CT ABDOMEN PELVIS W CONTRAST  Result Date: 07/16/2022 CLINICAL DATA:  Abdominal/flank pain, stone suspected Sepsis EXAM: CT ABDOMEN AND PELVIS WITH CONTRAST TECHNIQUE: Multidetector CT imaging of the abdomen and pelvis was performed using the standard protocol following  bolus administration of intravenous contrast. RADIATION DOSE REDUCTION: This exam was performed according to the departmental dose-optimization program which includes automated exposure control, adjustment of the mA and/or kV according to patient size and/or use of iterative reconstruction technique. CONTRAST:  80mL OMNIPAQUE IOHEXOL 300 MG/ML  SOLN COMPARISON:  04/17/2022 FINDINGS: Lower chest: Small bilateral pleural effusions. Compressive atelectasis in the lower lobes. Large hiatal hernia. Coronary artery and aortic calcifications. Hepatobiliary: Diffuse fatty infiltration of the liver. Intrahepatic and extrahepatic biliary ductal dilatation is stable since prior study. Pancreas: No focal abnormality or ductal dilatation. Spleen: No focal abnormality.  Normal size. 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 Left adrenal gland unremarkable. Scarring throughout the right kidney. Small cysts in the kidneys bilaterally. No follow-up imaging recommended. No hydronephrosis. Urinary bladder unremarkable. Stomach/Bowel: Left colonic diverticulosis. No active diverticulitis. Stomach and small bowel decompressed, unremarkable. Vascular/Lymphatic: Aortic atherosclerosis. No evidence of aneurysm or adenopathy. Reproductive: Uterus and right ovary unremarkable. 7 cm cyst in the left ovary, unchanged. Other: No free fluid or free air. Musculoskeletal: Degenerative disc disease and facet disease in the lower lumbar spine. No acute bony abnormality. IMPRESSION: Small bilateral pleural effusions. Compressive atelectasis in the lower lobes. Large hiatal hernia. Severe diffuse fatty infiltration of the liver. Stable biliary ductal dilatation. Left colonic diverticulosis.  No active diverticulitis. 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 Stable 7 cm left ovarian cystic lesion. Electronically Signed   By: Charlett Nose M.D.   On: 07/16/2022 19:13    DG Chest Port 1 View  Result Date: 07/16/2022 CLINICAL DATA:  Altered mental status. Hypotension. Sepsis question. EXAM: PORTABLE CHEST 1 VIEW COMPARISON:  05/29/2022 FINDINGS: The heart is normal in size. Large hiatal hernia courses into the left lung base with adjacent chronic atelectasis and small amount of pleural fluid. No new airspace disease. No pulmonary edema. No pneumothorax. Chronic left shoulder arthropathy. IMPRESSION: Large hiatal hernia with adjacent chronic atelectasis and small amount of pleural fluid at the left lung base. No acute findings. Electronically Signed   By: Narda Rutherford M.D.   On: 07/16/2022 17:35        Scheduled Meds:  insulin aspart  0-9 Units Subcutaneous Q6H   pantoprazole (PROTONIX) IV  40 mg Intravenous Q12H   Continuous Infusions:  cefTRIAXone (ROCEPHIN)  IV 2 g (07/17/22 2127)   lactated ringers     magnesium sulfate bolus IVPB     potassium chloride       LOS: 0 days    Time spent: 35 minutes  Signed,  Chealsea Paske Sherryll Burger, DO

## 2022-07-19 DIAGNOSIS — E861 Hypovolemia: Secondary | ICD-10-CM | POA: Diagnosis not present

## 2022-07-19 LAB — GLUCOSE, CAPILLARY
Glucose-Capillary: 161 mg/dL — ABNORMAL HIGH (ref 70–99)
Glucose-Capillary: 94 mg/dL (ref 70–99)
Glucose-Capillary: 124 mg/dL — ABNORMAL HIGH (ref 70–99)
Glucose-Capillary: 86 mg/dL (ref 70–99)
Glucose-Capillary: 122 mg/dL — ABNORMAL HIGH (ref 70–99)

## 2022-07-19 LAB — BASIC METABOLIC PANEL
Anion gap: 12 (ref 5–15)
BUN: 18 mg/dL (ref 8–23)
CO2: 20 mmol/L — ABNORMAL LOW (ref 22–32)
Calcium: 8.1 mg/dL — ABNORMAL LOW (ref 8.9–10.3)
Chloride: 111 mmol/L (ref 98–111)
Creatinine, Ser: 1.44 mg/dL — ABNORMAL HIGH (ref 0.44–1.00)
GFR, Estimated: 37 mL/min — ABNORMAL LOW (ref >60)
Glucose, Bld: 76 mg/dL (ref 70–99)
Potassium: 3.7 mmol/L (ref 3.5–5.1)
Sodium: 143 mmol/L (ref 135–145)

## 2022-07-19 LAB — URINE CULTURE: Culture: >=100000 — AB

## 2022-07-19 LAB — CBC
HCT: 35.8 % — ABNORMAL LOW (ref 36.0–46.0)
Hemoglobin: 11.9 g/dL — ABNORMAL LOW (ref 12.0–15.0)
MCH: 29 pg (ref 26.0–34.0)
MCHC: 33.2 g/dL (ref 30.0–36.0)
MCV: 87.3 fL (ref 80.0–100.0)
Platelets: 190 10*3/uL (ref 150–400)
RBC: 4.1 MIL/uL (ref 3.87–5.11)
RDW: 19.9 % — ABNORMAL HIGH (ref 11.5–15.5)
WBC: 7.6 10*3/uL (ref 4.0–10.5)
nRBC: 0.4 % — ABNORMAL HIGH (ref 0.0–0.2)

## 2022-07-19 LAB — MAGNESIUM: Magnesium: 2.1 mg/dL (ref 1.7–2.4)

## 2022-07-19 MED ORDER — SUCRALFATE 1 GM/10ML PO SUSP
1.0000 g | Freq: Three times a day (TID) | ORAL | Status: DC
  Administered 2022-07-19 – 2022-07-22 (×13): 1 g via ORAL
  Filled 2022-07-19 (×10): qty 10

## 2022-07-19 NOTE — Progress Notes (Signed)
PROGRESS NOTE    [name]  NGE:[id number] DOB: [birth year] DOA: 07/16/2022 PCP: [name], MD   Brief Narrative:  [name] is a 82 y.o. female with medical history significant for CKD stage III, dementia, esophageal dysphagia, gastric ulcer, Parkinson's, diabetes mellitus   Patient was sent to the ED from GI office with reports of hypotension with systolic in the 80s. Over the past 3 to 4 days, patient unable to swallow solids, but she has been able to swallow liquids. Patient is bedbound and unable to ambulate.    Recent hospitalization 4/30 to 5/5 for sepsis secondary to pyelonephritis. Also with esophageal dysphagia, underwent EGD with dilatation 5/2, with findings of large hiatal hernia, and 2 large ulcers in the cardia.   On presentation Blood pressure down to 64/49, improved after 2x 1L LR boluses. WBC 7.1.  Lactic acidosis 3.4  > 3 > 2.7, alongside volume resuscitation. 7/11 CT A/P with contrast shows small bilateral pleural effusion, large hiatal hernia. UA with small leukocytes and many bacteria, started IV Cefipime (last pyelo infxn in May was resistant to Cipro). Blood and urine cultures obtained. On Hospital Day 1, Ms Wingert wasn't able to provide a clear account of her symptoms, oriented to self and immediate location "bed," but not hospital, or time. Her BP has normalized after boluses and he is now on full liquid diet per SLP recommendations.  GI planning for EGD outpatient.  PT recommending SNF placement and patient awaiting placement.   Assessment & Plan:   Principal Problem:   Hypotension Active Problems:   Esophageal dysphagia   UTI (urinary tract infection)   Diabetes mellitus with stage 3 chronic kidney disease (HCC)   Dementia (HCC)   CKD (chronic kidney disease) stage 3, GFR 30-59 ml/min (HCC)   Multiple gastric ulcers  Assessment and Plan: Hypotension-resolved Hypotension likely from poor oral intake over the past several days 2/2 esophageal dysphagia.  Hypotension has improved this morning after volume resuscitation and mIVF with lactic acidosis trending down 3.4 > 3 >2.7.  Not concerned for systemic infection with otherwise normal vital signs and no leukocytosis, though appears to have local UTI, as below. BP ranging 109/75-132/102, holding home Carvedilol 3.125mg  BID. Has 2+ pitting edema to tibial plateaus, may resume Lasix 20mg .  -Continue to hold carvedilol and Lasix until further improving oral intake  Esophageal dysphagia Unable to take solids for the past 3 days, but able to drink liquids. Gagging with solids.  Underwent EGD with dilatation 5/2 of moderate Schatzki ring, large hiatal hernia, 2 large ulcers in cardia. - Remain n.p.o. - CBG q. 8 hourly - GI does not plan for EGD at the moment, SLP recommending full liquid diet which she continues at home - IV Protonix 40 twice daily -Carafate 4 times daily  E. coli and Pseudomonas UTI Recent urine culture 04/2022, resistant to Cipro otherwise pansensitive. Watching blood and urine cultures. - Follow urine culture -Discontinue further cefepime as she has completed 3-day course  CKD (chronic kidney disease) stage 3, GFR 30-59 ml/min (HCC) CKD stage IIIb.  Creatinine stable 1.44.  Dementia (HCC) Per daughter-. Mild to moderate. Has good and bad days, she is able to recognize family, but on bad days her speech is confused. Is Alert and oriented x2 this AM. Will speak with daughter. - Delirium precautions - Will continue to monitor -Appreciate PT evaluation recommending SNF and placement pending -Appreciate palliative care consultation for goals of care, she appears to be active with palliative outpatient  Diabetes mellitus with stage 3 chronic kidney disease (HCC) On humalog 50-50, 20 - 40U BID at hom.  A1c 8.3. Currently NPO so only have SSI sensitive for now. - Monitor CBG while n.p.o. - Hold home insulin - SSI- S q6h   DVT prophylaxis: SCDs  Code Status:DNR Family  Communication: Daughter [facility]) 7/14  Consultants:  GI Palliative Care  Procedures:  None  Antimicrobials:  IV Cefipime initiated 7/11   Subjective: Patient seen and evaluated today with no new acute complaints or concerns.  She appears overall confused and does have history of dementia.  She appears to be tolerating full liquid diet.  Objective: Vitals:   07/18/22 0458 07/18/22 1500 07/18/22 2020 07/19/22 0417  BP: 117/70 104/85 104/70 135/78  Pulse: 82 80 (!) 102 72  Resp: 20  20 16   Temp: 97.9 F (36.6 C) 98.3 F (36.8 C) 98.1 F (36.7 C) 97.7 F (36.5 C)  TempSrc:  Oral  Oral  SpO2: 100% 100% 100% 100%  Weight:      Height:        Intake/Output Summary (Last 24 hours) at 07/19/2022 1028 Last data filed at 07/19/2022 0500 Gross per 24 hour  Intake 1032.67 ml  Output 300 ml  Net 732.67 ml   Filed Weights   07/16/22 2228  Weight: 72.3 kg    Examination:  General exam: Appears calm and comfortable, confused Respiratory system: Clear to auscultation. Respiratory effort normal. Cardiovascular system: S1 & S2 heard, RRR.  Gastrointestinal system: Abdomen is soft, nontender, bowel sounds present Central nervous system: Alert and awake, confused at baseline Extremities: 2+ BLE edema Skin: No significant lesions noted Psychiatry: Flat affect.   Data Reviewed: I have personally reviewed following labs and imaging studies  CBC: Recent Labs  Lab 07/16/22 1742 07/17/22 0055 07/18/22 0516 07/19/22 0508  WBC 7.1 8.6 7.5 7.6  NEUTROABS 5.8  --   --   --   HGB 10.8* 11.9* 10.3* 11.9*  HCT 31.3* 35.4* 29.6* 35.8*  MCV 84.6 86.3 83.6 87.3  PLT 185 180 184 190   Basic Metabolic Panel: Recent Labs  Lab 07/16/22 1742 07/17/22 0055 07/18/22 0516 07/19/22 0508  NA 144 146* 144 143  K 3.5 3.7 3.1* 3.7  CL 113* 115* 114* 111  CO2 23 24 21* 20*  GLUCOSE 216* 200* 120* 76  BUN 18 19 20 18   CREATININE 1.40* 1.46* 1.40* 1.44*  CALCIUM 8.0* 8.3*  7.9* 8.1*  MG  --   --  1.6* 2.1   GFR: Estimated Creatinine Clearance: 30.5 mL/min (A) (by C-G formula based on SCr of 1.44 mg/dL (H)). Liver Function Tests: Recent Labs  Lab 07/16/22 1742 07/18/22 0516  AST 26 17  ALT 17 14  ALKPHOS 81 76  BILITOT 1.1 0.9  PROT 4.5* 4.3*  ALBUMIN <1.5* <1.5*   No results for input(s): "LIPASE", "AMYLASE" in the last 168 hours. No results for input(s): "AMMONIA" in the last 168 hours. Coagulation Profile: Recent Labs  Lab 07/16/22 1742  INR 1.0   Cardiac Enzymes: No results for input(s): "CKTOTAL", "CKMB", "CKMBINDEX", "TROPONINI" in the last 168 hours. BNP (last 3 results) No results for input(s): "PROBNP" in the last 8760 hours. HbA1C: No results for input(s): "HGBA1C" in the last 72 hours. CBG: Recent Labs  Lab 07/18/22 0731 07/18/22 1201 07/18/22 1657 07/19/22 0128 07/19/22 0546  GLUCAP 112* 124* 120* 161* 94   Lipid Profile: No results for input(s): "CHOL", "HDL", "LDLCALC", "TRIG", "CHOLHDL", "LDLDIRECT" in  the last 72 hours. Thyroid Function Tests: No results for input(s): "TSH", "T4TOTAL", "FREET4", "T3FREE", "THYROIDAB" in the last 72 hours. Anemia Panel: No results for input(s): "VITAMINB12", "FOLATE", "FERRITIN", "TIBC", "IRON", "RETICCTPCT" in the last 72 hours. Sepsis Labs: Recent Labs  Lab 07/16/22 1630 07/16/22 1742 07/17/22 0055 07/17/22 1221  LATICACIDVEN 3.4* 3.0* 2.7* 1.5    Recent Results (from the past 240 hour(s))  Culture, blood (Routine x 2)     Status: None (Preliminary result)   Collection Time: 07/16/22  4:15 PM   Specimen: BLOOD  Result Value Ref Range Status   Specimen Description BLOOD BLOOD LEFT WRIST  Final   Special Requests   Final    BOTTLES DRAWN AEROBIC ONLY Blood Culture results may not be optimal due to an inadequate volume of blood received in culture bottles   Culture   Final    NO GROWTH 3 DAYS Performed at [facility], [address] [place], [place] [address]    Report  Status PENDING  Incomplete  Culture, blood (Routine x 2)     Status: None (Preliminary result)   Collection Time: 07/16/22  4:27 PM   Specimen: BLOOD  Result Value Ref Range Status   Specimen Description BLOOD BLOOD RIGHT ARM  Final   Special Requests   Final    BOTTLES DRAWN AEROBIC ONLY Blood Culture adequate volume   Culture   Final    NO GROWTH 3 DAYS Performed at [facility], [address] [place], [place] [address]    Report Status PENDING  Incomplete  Urine Culture     Status: Abnormal   Collection Time: 07/16/22  4:40 PM   Specimen: Urine, Random  Result Value Ref Range Status   Specimen Description   Final    URINE, RANDOM Performed at [facility], [address] [place], [place] [address]    Special Requests   Final    NONE Reflexed from [id number] Performed at [facility], [address], [place], [place] [address]    Culture (A)  Final    >=100,000 COLONIES/mL ESCHERICHIA COLI >=100,000 COLONIES/mL PSEUDOMONAS AERUGINOSA    Report Status 07/19/2022 FINAL  Final   Organism ID, Bacteria ESCHERICHIA COLI (A)  Final   Organism ID, Bacteria PSEUDOMONAS AERUGINOSA (A)  Final      Susceptibility   Escherichia coli - MIC*    AMPICILLIN 16 INTERMEDIATE Intermediate     CEFAZOLIN <=4 SENSITIVE Sensitive     CEFEPIME <=0.12 SENSITIVE Sensitive     CEFTRIAXONE <=0.25 SENSITIVE Sensitive     CIPROFLOXACIN <=0.25 SENSITIVE Sensitive     GENTAMICIN <=1 SENSITIVE Sensitive     IMIPENEM <=0.25 SENSITIVE Sensitive     NITROFURANTOIN <=16 SENSITIVE Sensitive     TRIMETH/SULFA <=20 SENSITIVE Sensitive     AMPICILLIN/SULBACTAM 4 SENSITIVE Sensitive     PIP/TAZO <=4 SENSITIVE Sensitive     * >=100,000 COLONIES/mL ESCHERICHIA COLI   Pseudomonas aeruginosa - MIC*    CEFTAZIDIME 2 SENSITIVE Sensitive     CIPROFLOXACIN <=0.25 SENSITIVE Sensitive     GENTAMICIN <=1 SENSITIVE Sensitive     IMIPENEM 2 SENSITIVE Sensitive     PIP/TAZO 8 SENSITIVE Sensitive     CEFEPIME 1 SENSITIVE  Sensitive     * >=100,000 COLONIES/mL PSEUDOMONAS AERUGINOSA  Resp panel by RT-PCR (RSV, Flu A&B, Covid) Anterior Nasal Swab     Status: None   Collection Time: 07/16/22  7:53 PM   Specimen: Anterior Nasal Swab  Result Value Ref Range Status  SARS Coronavirus 2 by RT PCR NEGATIVE NEGATIVE Final    Comment: (NOTE) SARS-CoV-2 target nucleic acids are NOT DETECTED.  The SARS-CoV-2 RNA is generally detectable in upper respiratory specimens during the acute phase of infection. The lowest concentration of SARS-CoV-2 viral copies this assay can detect is 138 copies/mL. A negative result does not preclude SARS-Cov-2 infection and should not be used as the sole basis for treatment or other patient management decisions. A negative result may occur with  improper specimen collection/handling, submission of specimen other than nasopharyngeal swab, presence of viral mutation(s) within the areas targeted by this assay, and inadequate number of viral copies(<138 copies/mL). A negative result must be combined with clinical observations, patient history, and epidemiological information. The expected result is Negative.  Fact Sheet for Patients:  BloggerCourse.com  Fact Sheet for Healthcare Providers:  SeriousBroker.it  This test is no t yet approved or cleared by the [place] FDA and  has been authorized for detection and/or diagnosis of SARS-CoV-2 by FDA under an Emergency Use Authorization (EUA). This EUA will remain  in effect (meaning this test can be used) for the duration of the COVID-19 declaration under Section 564(b)(1) of the Act, 21 U.S.C.section 360bbb-3(b)(1), unless the authorization is terminated  or revoked sooner.       Influenza A by PCR NEGATIVE NEGATIVE Final   Influenza B by PCR NEGATIVE NEGATIVE Final    Comment: (NOTE) The Xpert Xpress SARS-CoV-2/FLU/RSV plus assay is intended as an aid in the diagnosis of  influenza from Nasopharyngeal swab specimens and should not be used as a sole basis for treatment. Nasal washings and aspirates are unacceptable for Xpert Xpress SARS-CoV-2/FLU/RSV testing.  Fact Sheet for Patients: BloggerCourse.com  Fact Sheet for Healthcare Providers: SeriousBroker.it  This test is not yet approved or cleared by the [place] FDA and has been authorized for detection and/or diagnosis of SARS-CoV-2 by FDA under an Emergency Use Authorization (EUA). This EUA will remain in effect (meaning this test can be used) for the duration of the COVID-19 declaration under Section 564(b)(1) of the Act, 21 U.S.C. section 360bbb-3(b)(1), unless the authorization is terminated or revoked.     Resp Syncytial Virus by PCR NEGATIVE NEGATIVE Final    Comment: (NOTE) Fact Sheet for Patients: BloggerCourse.com  Fact Sheet for Healthcare Providers: SeriousBroker.it  This test is not yet approved or cleared by the [place] FDA and has been authorized for detection and/or diagnosis of SARS-CoV-2 by FDA under an Emergency Use Authorization (EUA). This EUA will remain in effect (meaning this test can be used) for the duration of the COVID-19 declaration under Section 564(b)(1) of the Act, 21 U.S.C. section 360bbb-3(b)(1), unless the authorization is terminated or revoked.  Performed at [facility], [address], [place], [place] [address]          Radiology Studies: No results found.      Scheduled Meds:  insulin aspart  0-9 Units Subcutaneous Q6H   pantoprazole (PROTONIX) IV  40 mg Intravenous Q12H   sucralfate  1 g Oral TID WC & HS   Continuous Infusions:  cefTRIAXone (ROCEPHIN)  IV 2 g (07/18/22 2040)     LOS: 1 day    Time spent: 35 minutes  Signed,  Mariaelena Cade Sherryll Burger, DO

## 2022-07-19 NOTE — NC FL2 (Signed)
[place] MEDICAID FL2 LEVEL OF CARE FORM     IDENTIFICATION  Patient Name: [name] Birthdate: Jul 05, 1940 Sex: female Admission Date (Current Location): 07/16/2022  [place] and [place] Number:  Aaron Edelman [id number] [facility] Facility and Address:  [facility],  [address] [place] [address]      Provider Number: [id number]  Attending Physician Name and Address:  Erick Blinks, DO  Relative Name and Phone Number:  Mystery, Villatoro (Daughter)  [phone] (Home Phone    Current Level of Care:   Recommended Level of Care: Skilled Nursing Facility Prior Approval Number:    Date Approved/Denied:   PASRR Number: Pending  Discharge Plan: SNF    Current Diagnoses: Patient Active Problem List   Diagnosis Date Noted   Constipation 06/11/2022   Hiatal hernia 05/08/2022   Multiple gastric ulcers 05/08/2022   Hypotension 05/05/2022   UTI (urinary tract infection) 05/05/2022   Dementia (HCC) 05/05/2022   CKD (chronic kidney disease) stage 3, GFR 30-59 ml/min (HCC) 05/05/2022   Diarrhea 02/02/2022   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   Abnormal CT scan 09/05/2021   Dilation of biliary tract 09/05/2021   Nausea and vomiting 01/23/2021   Esophageal dysphagia 08/22/2020   Gastroesophageal reflux disease 08/22/2020   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   Syncope 10/12/2014   Diabetes mellitus with stage 3 chronic kidney disease (HCC) 10/12/2014   Hypertension 10/12/2014   OSTEOARTHRITIS, 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   TRIGGER FINGER 04/27/2007    Orientation RESPIRATION BLADDER Height & Weight     Self  Normal Incontinent Weight: 159 lb 6.3 oz (72.3 kg) Height:  5\' 5"  (165.1 cm)  BEHAVIORAL SYMPTOMS/MOOD NEUROLOGICAL BOWEL NUTRITION STATUS      Incontinent Diet  AMBULATORY STATUS COMMUNICATION OF NEEDS Skin   Extensive Assist Verbally Other (Comment) (Echomymosis)                       Personal Care Assistance Level of Assistance  Bathing, Dressing  Bathing Assistance: Maximum assistance   Dressing Assistance: Maximum assistance     Functional Limitations Info  Sight Sight Info: Impaired (Glasses)        SPECIAL CARE FACTORS FREQUENCY  PT (By licensed PT), OT (By licensed OT)     PT Frequency: 5 X week OT Frequency: 5X week            Contractures Contractures Info: Not present    Additional Factors Info  Code Status (DNR) Code Status Info: DNR             Current Medications (07/19/2022):  This is the current hospital active medication list Current Facility-Administered Medications  Medication Dose Route Frequency Provider Last Rate Last Admin   acetaminophen (TYLENOL) tablet 650 mg  650 mg Oral Q6H PRN Emokpae, Ejiroghene E, MD       Or   acetaminophen (TYLENOL) suppository 650 mg  650 mg Rectal Q6H PRN Emokpae, Ejiroghene E, MD       insulin aspart (novoLOG) injection 0-9 Units  0-9 Units Subcutaneous Q6H Emokpae, Ejiroghene E, MD   1 Units at 07/19/22 1158   morphine (PF) 2 MG/ML injection 1 mg  1 mg Intravenous Q4H PRN Emokpae, Ejiroghene E, MD   1 mg at 07/18/22 2101   ondansetron (ZOFRAN) tablet 4 mg  4 mg Oral Q6H PRN Emokpae, Ejiroghene E, MD       Or   ondansetron (ZOFRAN) injection 4 mg  4 mg Intravenous Q6H PRN Emokpae, Heloise Beecham, MD  pantoprazole (PROTONIX) injection 40 mg  40 mg Intravenous Q12H Shah, Pratik D, DO   40 mg at 07/19/22 0826   polyethylene glycol (MIRALAX / GLYCOLAX) packet 17 g  17 g Oral Daily PRN Emokpae, Ejiroghene E, MD       sucralfate (CARAFATE) 1 GM/10ML suspension 1 g  1 g Oral TID WC & HS Shah, Pratik D, DO   1 g at 07/19/22 1158     Discharge Medications: Please see discharge summary for a list of discharge medications.  Relevant Imaging Results:  Relevant Lab Results:   Additional Information [id number]  Catalina Gravel, LCSW

## 2022-07-19 NOTE — TOC Progression Note (Addendum)
Transition of Care [facility]) - Progression Note    Patient Details  Name: [name] MRN: [id number] Date of Birth: 11/22/1940  Transition of Care [facility]) CM/SW Contact  Catalina Gravel, LCSW Phone Number: 07/19/2022, 1:24 PM  Clinical Narrative:    CSW learned in progression pt and daughter now want SNF as recommended by PT. Pt has some limitations. CSW contacted daughter to clarify as well.  She stated plan is to get some strengthen at Rehab as daughter assists her and feels this will help pt.  Pt in agreement. PASSR pending .  CSW uploaded clinicals as requested and emailed Help Desk to alert the clinicals sent.  Pt/daughter does not want [facility] as a facility. [facility], [place], [place]. Bed requests sent- need Auth and PASSR finalized. TOC to follow.  Addendum: CSW visited pt at bedside.  Asked can I talk to her.Talked to her, slow to respond, but able to respond. Talked about rehab at [facility] to regain strength- pt hesitated and said I don't know. A visiting family member interjected saying you have to speak to her daughter, she may not understand. TOC to follow.    Barriers to Discharge: Continued Medical Work up  Expected Discharge Plan and Services                                               Social Determinants of Health (SDOH) Interventions SDOH Screenings   Food Insecurity: No Food Insecurity (07/16/2022)  Housing: Low Risk  (07/16/2022)  Transportation Needs: No Transportation Needs (07/16/2022)  Utilities: Not At Risk (07/16/2022)  Alcohol Screen: Low Risk  (02/10/2022)  Depression (PHQ2-9): High Risk (02/10/2022)  Financial Resource Strain: Low Risk  (02/10/2022)  Physical Activity: Inactive (02/10/2022)  Social Connections: Socially Isolated (02/10/2022)  Stress: No Stress Concern Present (02/10/2022)  Tobacco Use: Low Risk  (07/16/2022)    Readmission Risk Interventions     No data to display

## 2022-07-20 ENCOUNTER — Telehealth: Payer: Self-pay | Admitting: *Deleted

## 2022-07-20 ENCOUNTER — Encounter (HOSPITAL_COMMUNITY): Payer: Self-pay | Admitting: Internal Medicine

## 2022-07-20 DIAGNOSIS — E861 Hypovolemia: Secondary | ICD-10-CM | POA: Diagnosis not present

## 2022-07-20 DIAGNOSIS — F0390 Unspecified dementia without behavioral disturbance: Secondary | ICD-10-CM

## 2022-07-20 DIAGNOSIS — B9620 Unspecified Escherichia coli [E. coli] as the cause of diseases classified elsewhere: Secondary | ICD-10-CM

## 2022-07-20 DIAGNOSIS — Z7189 Other specified counseling: Secondary | ICD-10-CM | POA: Diagnosis not present

## 2022-07-20 DIAGNOSIS — Z515 Encounter for palliative care: Secondary | ICD-10-CM | POA: Diagnosis not present

## 2022-07-20 DIAGNOSIS — F03B0 Unspecified dementia, moderate, without behavioral disturbance, psychotic disturbance, mood disturbance, and anxiety: Secondary | ICD-10-CM

## 2022-07-20 DIAGNOSIS — Z794 Long term (current) use of insulin: Secondary | ICD-10-CM

## 2022-07-20 DIAGNOSIS — R1319 Other dysphagia: Secondary | ICD-10-CM | POA: Diagnosis not present

## 2022-07-20 DIAGNOSIS — N390 Urinary tract infection, site not specified: Principal | ICD-10-CM

## 2022-07-20 DIAGNOSIS — B965 Pseudomonas (aeruginosa) (mallei) (pseudomallei) as the cause of diseases classified elsewhere: Secondary | ICD-10-CM

## 2022-07-20 DIAGNOSIS — R1310 Dysphagia, unspecified: Secondary | ICD-10-CM

## 2022-07-20 DIAGNOSIS — K259 Gastric ulcer, unspecified as acute or chronic, without hemorrhage or perforation: Secondary | ICD-10-CM | POA: Diagnosis not present

## 2022-07-20 LAB — BASIC METABOLIC PANEL
Anion gap: 7 (ref 5–15)
BUN: 17 mg/dL (ref 8–23)
CO2: 24 mmol/L (ref 22–32)
Calcium: 8 mg/dL — ABNORMAL LOW (ref 8.9–10.3)
Chloride: 114 mmol/L — ABNORMAL HIGH (ref 98–111)
Creatinine, Ser: 1.48 mg/dL — ABNORMAL HIGH (ref 0.44–1.00)
GFR, Estimated: 35 mL/min — ABNORMAL LOW (ref >60)
Glucose, Bld: 101 mg/dL — ABNORMAL HIGH (ref 70–99)
Potassium: 3.5 mmol/L (ref 3.5–5.1)
Sodium: 145 mmol/L (ref 135–145)

## 2022-07-20 LAB — GLUCOSE, CAPILLARY
Glucose-Capillary: 103 mg/dL — ABNORMAL HIGH (ref 70–99)
Glucose-Capillary: 107 mg/dL — ABNORMAL HIGH (ref 70–99)
Glucose-Capillary: 142 mg/dL — ABNORMAL HIGH (ref 70–99)
Glucose-Capillary: 160 mg/dL — ABNORMAL HIGH (ref 70–99)
Glucose-Capillary: 99 mg/dL (ref 70–99)

## 2022-07-20 LAB — CBC
HCT: 29.7 % — ABNORMAL LOW (ref 36.0–46.0)
Hemoglobin: 10.2 g/dL — ABNORMAL LOW (ref 12.0–15.0)
MCH: 29.1 pg (ref 26.0–34.0)
MCHC: 34.3 g/dL (ref 30.0–36.0)
MCV: 84.6 fL (ref 80.0–100.0)
Platelets: 183 10*3/uL (ref 150–400)
RBC: 3.51 MIL/uL — ABNORMAL LOW (ref 3.87–5.11)
RDW: 18.7 % — ABNORMAL HIGH (ref 11.5–15.5)
WBC: 5.8 10*3/uL (ref 4.0–10.5)
nRBC: 0 % (ref 0.0–0.2)

## 2022-07-20 LAB — CULTURE, BLOOD (ROUTINE X 2): Culture: NO GROWTH

## 2022-07-20 LAB — MAGNESIUM: Magnesium: 1.9 mg/dL (ref 1.7–2.4)

## 2022-07-20 NOTE — Progress Notes (Signed)
PROGRESS NOTE    [name]  NFA:[id number] DOB: 10-14-1940 DOA: 07/16/2022 PCP: [name], MD   Brief Narrative:  [name] is a 82 y.o. female with medical history significant for CKD stage III, dementia, esophageal dysphagia, gastric ulcer, Parkinson's, diabetes mellitus who was sent to the ED from GI office with reports of hypotension with systolic in the 80s.   Over the past 3 to 4 days before admission, patient reportedly unable to swallow solids. Patient is bedbound and unable to ambulate.     Recent hospitalization 4/30 to 5/5 for sepsis secondary to pyelonephritis. Also with esophageal dysphagia, underwent EGD with dilatation 5/2, with findings of large hiatal hernia, and 2 large ulcers in the cardia.   On presentation Blood pressure down to 64/49, improved after 2x 1L LR boluses. WBC 7.1.  Lactic acidosis 3.4  > 3 > 2.7, alongside volume resuscitation. 7/11 CT A/P with contrast shows small bilateral pleural effusion, large hiatal hernia. UA with small leukocytes and many bacteria, and completed 3 day course of IV Cefipime (last pyelo infxn in May was resistant to Cipro). Urine culture grew E.coli and Pseudomonas. For most of her hospitalization, Ms Sotto wasn't able to provide a clear account of her symptoms, and was oriented to self and immediate location "bed," but not hospital, or time. Her BP normalized after boluses and she is now on full liquid diet per SLP recommendations. GI not planning for EGD unless pt develops bleeding, but has recommended discharge with Omeprazole BID and Carafate QID along with 6-8 small meals each day. PT recommended SNF placement.     Assessment & Plan:   Principal Problem:   Hypotension Active Problems:   Diabetes mellitus with stage 3 chronic kidney disease (HCC)   Esophageal dysphagia   UTI (urinary tract infection)   Dementia (HCC)   CKD (chronic kidney disease) stage 3, GFR 30-59 ml/min (HCC)   Multiple gastric ulcers  Assessment and  Plan: Hypotension-resolved Hypotension likely from poor oral intake over the past several days 2/2 dysphagia. Hypotension improved after volume resuscitation and mIVF with lactic acidosis resolving from peak 3.8. Not concerned for systemic infection with otherwise normal vital signs and no leukocytosis, though did have local UTI, as below. BP ranging 98-87-118/64, holding home Carvedilol 3.125mg  BID and Lasix. -Continue to hold carvedilol and Lasix until further improving oral intake  Esophageal dysphagia Unable to take solids for the past 3 days, but able to drink liquids. Gagging with solids.  Underwent EGD with dilatation 5/2 of moderate Schatzki ring, large hiatal hernia, 2 large ulcers in cardia. Was seen by speech pathology and GI, who ruled out oropharyngeal dysphagia, and ultimately recommended full liquid diet, small meals 6-8/day - Full liquid diet - CBG q. 8 hourly - GI does not plan for EGD unless bleeding - IV Protonix 40 twice daily, discharge with Omeprazole BID - Carafate 4 times daily  E. coli and Pseudomonas UTI Recent urine culture 04/2022, resistant to Cipro otherwise pansensitive. Urine culture resulted with E.coli and Pseudomonas. She is now s/p 3 days Cefepime. 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  Creatinine stable 1.48.  Dementia [facility]) Per daughter- Mild to moderate. Has good and bad days, she is able to recognize family, but on bad days her speech is confused. Is Alert and oriented x2 this AM. Have spoken with daughter.  - Delirium precautions - Will continue  to monitor - Appreciate PT evaluation recommending SNF and placement pending - Appreciate palliative care consultation for goals of care, she appears to be active with palliative outpatient  Diabetes mellitus with stage 3 chronic kidney disease (HCC) On humalog 50-50, 20 - 40U BID at home.  A1c 8.3.  Currently on full liquid diet and sugars only ranging 86-107 while on SSI sensitive.  - Monitor CBG  - Hold home insulin - SSI- S q6h   DVT prophylaxis: SCDs  Code Status: DNR Family Communication: Daughter [name] [facility]) 7/14  Consultants:  GI Palliative Care  Procedures:  None  Antimicrobials:  IV Cefipime 7/11-7/14   Subjective: Patient seen and evaluated today with no new acute complaints or concerns.  She appears overall confused and does have history of dementia.  She appears to be tolerating full liquid diet, and denies any concerns today.  Objective: Vitals:   07/19/22 1333 07/19/22 2041 07/20/22 0439 07/20/22 1320  BP: 118/64 98/87 103/62 94/67  Pulse: (!) 102 76 80 73  Resp:  16 16 14   Temp: 98.3 F (36.8 C) 98.6 F (37 C) 98.3 F (36.8 C) 97.6 F (36.4 C)  TempSrc: Oral Oral Oral Oral  SpO2: 94% 100% 97% 100%  Weight:      Height:        Intake/Output Summary (Last 24 hours) at 07/20/2022 1325 Last data filed at 07/20/2022 0935 Gross per 24 hour  Intake 20 ml  Output 500 ml  Net -480 ml   Filed Weights   07/16/22 2228  Weight: 72.3 kg    Examination:  General exam: Appears calm and comfortable, confused Respiratory system: Clear to auscultation. Respiratory effort normal. Cardiovascular system: S1 & S2 heard, RRR.  Gastrointestinal system: Abdomen is soft, nontender, bowel sounds present Central nervous system: Alert and awake, confused at baseline Extremities: 1+ BLE edema Skin: No significant lesions noted Psychiatry: Flat affect.   Data Reviewed: I have personally reviewed following labs and imaging studies  CBC: Recent Labs  Lab 07/16/22 1742 07/17/22 0055 07/18/22 0516 07/19/22 0508 07/20/22 0509  WBC 7.1 8.6 7.5 7.6 5.8  NEUTROABS 5.8  --   --   --   --   HGB 10.8* 11.9* 10.3* 11.9* 10.2*  HCT 31.3* 35.4* 29.6* 35.8* 29.7*  MCV 84.6 86.3 83.6 87.3 84.6  PLT 185 180 184 190 183   Basic Metabolic Panel: Recent Labs   Lab 07/16/22 1742 07/17/22 0055 07/18/22 0516 07/19/22 0508 07/20/22 0509  NA 144 146* 144 143 145  K 3.5 3.7 3.1* 3.7 3.5  CL 113* 115* 114* 111 114*  CO2 23 24 21* 20* 24  GLUCOSE 216* 200* 120* 76 101*  BUN 18 19 20 18 17   CREATININE 1.40* 1.46* 1.40* 1.44* 1.48*  CALCIUM 8.0* 8.3* 7.9* 8.1* 8.0*  MG  --   --  1.6* 2.1 1.9   GFR: Estimated Creatinine Clearance: 29.7 mL/min (A) (by C-G formula based on SCr of 1.48 mg/dL (H)). Liver Function Tests: Recent Labs  Lab 07/16/22 1742 07/18/22 0516  AST 26 17  ALT 17 14  ALKPHOS 81 76  BILITOT 1.1 0.9  PROT 4.5* 4.3*  ALBUMIN <1.5* <1.5*   No results for input(s): "LIPASE", "AMYLASE" in the last 168 hours. No results for input(s): "AMMONIA" in the last 168 hours. Coagulation Profile: Recent Labs  Lab 07/16/22 1742  INR 1.0   Cardiac Enzymes: No results for input(s): "CKTOTAL", "CKMB", "CKMBINDEX", "TROPONINI" in the last 168 hours. BNP (last  3 results) No results for input(s): "PROBNP" in the last 8760 hours. HbA1C: No results for input(s): "HGBA1C" in the last 72 hours. CBG: Recent Labs  Lab 07/19/22 1548 07/19/22 1800 07/19/22 2359 07/20/22 0535 07/20/22 1135  GLUCAP 86 122* 99 103* 107*   Lipid Profile: No results for input(s): "CHOL", "HDL", "LDLCALC", "TRIG", "CHOLHDL", "LDLDIRECT" in the last 72 hours. Thyroid Function Tests: No results for input(s): "TSH", "T4TOTAL", "FREET4", "T3FREE", "THYROIDAB" in the last 72 hours. Anemia Panel: No results for input(s): "VITAMINB12", "FOLATE", "FERRITIN", "TIBC", "IRON", "RETICCTPCT" in the last 72 hours. Sepsis Labs: Recent Labs  Lab 07/16/22 1630 07/16/22 1742 07/17/22 0055 07/17/22 1221  LATICACIDVEN 3.4* 3.0* 2.7* 1.5    Recent Results (from the past 240 hour(s))  Culture, blood (Routine x 2)     Status: None (Preliminary result)   Collection Time: 07/16/22  4:15 PM   Specimen: BLOOD  Result Value Ref Range Status   Specimen Description BLOOD  BLOOD LEFT WRIST  Final   Special Requests   Final    BOTTLES DRAWN AEROBIC ONLY Blood Culture results may not be optimal due to an inadequate volume of blood received in culture bottles   Culture   Final    NO GROWTH 4 DAYS Performed at [facility], [address] [place], [place] [address]    Report Status PENDING  Incomplete  Culture, blood (Routine x 2)     Status: None (Preliminary result)   Collection Time: 07/16/22  4:27 PM   Specimen: BLOOD  Result Value Ref Range Status   Specimen Description BLOOD BLOOD RIGHT ARM  Final   Special Requests   Final    BOTTLES DRAWN AEROBIC ONLY Blood Culture adequate volume   Culture   Final    NO GROWTH 4 DAYS Performed at [facility], [address], [place], [place] [address]    Report Status PENDING  Incomplete  Urine Culture     Status: Abnormal   Collection Time: 07/16/22  4:40 PM   Specimen: Urine, Random  Result Value Ref Range Status   Specimen Description   Final    URINE, RANDOM Performed at [facility], [address] [place], [place] [address]    Special Requests   Final    NONE Reflexed from [id number] Performed at [facility], [address] [place], [place] [address]    Culture (A)  Final    >=100,000 COLONIES/mL ESCHERICHIA COLI >=100,000 COLONIES/mL PSEUDOMONAS AERUGINOSA    Report Status 07/19/2022 FINAL  Final   Organism ID, Bacteria ESCHERICHIA COLI (A)  Final   Organism ID, Bacteria PSEUDOMONAS AERUGINOSA (A)  Final      Susceptibility   Escherichia coli - MIC*    AMPICILLIN 16 INTERMEDIATE Intermediate     CEFAZOLIN <=4 SENSITIVE Sensitive     CEFEPIME <=0.12 SENSITIVE Sensitive     CEFTRIAXONE <=0.25 SENSITIVE Sensitive     CIPROFLOXACIN <=0.25 SENSITIVE Sensitive     GENTAMICIN <=1 SENSITIVE Sensitive     IMIPENEM <=0.25 SENSITIVE Sensitive     NITROFURANTOIN <=16 SENSITIVE Sensitive     TRIMETH/SULFA <=20 SENSITIVE Sensitive     AMPICILLIN/SULBACTAM 4 SENSITIVE Sensitive     PIP/TAZO <=4 SENSITIVE  Sensitive     * >=100,000 COLONIES/mL ESCHERICHIA COLI   Pseudomonas aeruginosa - MIC*    CEFTAZIDIME 2 SENSITIVE Sensitive     CIPROFLOXACIN <=0.25 SENSITIVE Sensitive     GENTAMICIN <=1 SENSITIVE Sensitive     IMIPENEM 2 SENSITIVE Sensitive  PIP/TAZO 8 SENSITIVE Sensitive     CEFEPIME 1 SENSITIVE Sensitive     * >=100,000 COLONIES/mL PSEUDOMONAS AERUGINOSA  Resp panel by RT-PCR (RSV, Flu A&B, Covid) Anterior Nasal Swab     Status: None   Collection Time: 07/16/22  7:53 PM   Specimen: Anterior Nasal Swab  Result Value Ref Range Status   SARS Coronavirus 2 by RT PCR NEGATIVE NEGATIVE Final    Comment: (NOTE) SARS-CoV-2 target nucleic acids are NOT DETECTED.  The SARS-CoV-2 RNA is generally detectable in upper respiratory specimens during the acute phase of infection. The lowest concentration of SARS-CoV-2 viral copies this assay can detect is 138 copies/mL. A negative result does not preclude SARS-Cov-2 infection and should not be used as the sole basis for treatment or other patient management decisions. A negative result may occur with  improper specimen collection/handling, submission of specimen other than nasopharyngeal swab, presence of viral mutation(s) within the areas targeted by this assay, and inadequate number of viral copies(<138 copies/mL). A negative result must be combined with clinical observations, patient history, and epidemiological information. The expected result is Negative.  Fact Sheet for Patients:  BloggerCourse.com  Fact Sheet for Healthcare Providers:  SeriousBroker.it  This test is no t yet approved or cleared by the [place] FDA and  has been authorized for detection and/or diagnosis of SARS-CoV-2 by FDA under an Emergency Use Authorization (EUA). This EUA will remain  in effect (meaning this test can be used) for the duration of the COVID-19 declaration under Section 564(b)(1) of the Act,  21 U.S.C.section 360bbb-3(b)(1), unless the authorization is terminated  or revoked sooner.       Influenza A by PCR NEGATIVE NEGATIVE Final   Influenza B by PCR NEGATIVE NEGATIVE Final    Comment: (NOTE) The Xpert Xpress SARS-CoV-2/FLU/RSV plus assay is intended as an aid in the diagnosis of influenza from Nasopharyngeal swab specimens and should not be used as a sole basis for treatment. Nasal washings and aspirates are unacceptable for Xpert Xpress SARS-CoV-2/FLU/RSV testing.  Fact Sheet for Patients: BloggerCourse.com  Fact Sheet for Healthcare Providers: SeriousBroker.it  This test is not yet approved or cleared by the [place] FDA and has been authorized for detection and/or diagnosis of SARS-CoV-2 by FDA under an Emergency Use Authorization (EUA). This EUA will remain in effect (meaning this test can be used) for the duration of the COVID-19 declaration under Section 564(b)(1) of the Act, 21 U.S.C. section 360bbb-3(b)(1), unless the authorization is terminated or revoked.     Resp Syncytial Virus by PCR NEGATIVE NEGATIVE Final    Comment: (NOTE) Fact Sheet for Patients: BloggerCourse.com  Fact Sheet for Healthcare Providers: SeriousBroker.it  This test is not yet approved or cleared by the [place] FDA and has been authorized for detection and/or diagnosis of SARS-CoV-2 by FDA under an Emergency Use Authorization (EUA). This EUA will remain in effect (meaning this test can be used) for the duration of the COVID-19 declaration under Section 564(b)(1) of the Act, 21 U.S.C. section 360bbb-3(b)(1), unless the authorization is terminated or revoked.  Performed at [facility], [address], [place], [place] [address]          Radiology Studies: No results found.      Scheduled Meds:  insulin aspart  0-9 Units Subcutaneous Q6H   pantoprazole  (PROTONIX) IV  40 mg Intravenous Q12H   sucralfate  1 g Oral TID WC & HS   Continuous Infusions:     LOS: 2 days  Time spent: 35 minutes  Signed,  Marcelline Mates, MS4 Working with Dr. Maurilio Lovely

## 2022-07-20 NOTE — Progress Notes (Addendum)
Subjective: Patient not able to give any reliable hitory. States she feels ok. Denies any trouble swallowing liquids. Spoke with nurse who states patient does fine with swallowing liquids, but she just doesn't want to drink much. Takes a few sips and says she is done. No complaints of abdominal pina, nausea, or vomiting.   Spoke with daughter who states patient ate ice cream and jello for her yesterday. She did well with this. States her mom will tell you she is full but really isn't, its the dementia. States if you continue to offer the foods, usually she will eat it. She plan to come today to feed her lunch.    Objective: Vital signs in last 24 hours: Temp:  [98.3 F (36.8 C)-98.6 F (37 C)] 98.3 F (36.8 C) (07/15 0439) Pulse Rate:  [76-102] 80 (07/15 0439) Resp:  [16] 16 (07/15 0439) BP: (98-118)/(62-87) 103/62 (07/15 0439) SpO2:  [94 %-100 %] 97 % (07/15 0439)   General:   Alert , oriented to self and location, NAD.  Head:  Normocephalic and atraumatic. Abdomen:  Bowel sounds present, soft, non-tender, non-distended. No rebound or guarding. No masses appreciated  Extremities:  With trace LEedema. Psych:  Flat affect.  Intake/Output from previous day: 07/14 0701 - 07/15 0700 In: 20 [P.O.:20] Out: 500 [Urine:500] Intake/Output this shift: No intake/output data recorded.  Lab Results: Recent Labs    07/18/22 0516 07/19/22 0508 07/20/22 0509  WBC 7.5 7.6 5.8  HGB 10.3* 11.9* 10.2*  HCT 29.6* 35.8* 29.7*  PLT 184 190 183   BMET Recent Labs    07/18/22 0516 07/19/22 0508 07/20/22 0509  NA 144 143 145  K 3.1* 3.7 3.5  CL 114* 111 114*  CO2 21* 20* 24  GLUCOSE 120* 76 101*  BUN 20 18 17   CREATININE 1.40* 1.44* 1.48*  CALCIUM 7.9* 8.1* 8.0*   LFT Recent Labs    07/18/22 0516  PROT 4.3*  ALBUMIN <1.5*  AST 17  ALT 14  ALKPHOS 76  BILITOT 0.9     Assessment: 82 y.o. female with a history of CKD Stage III, dementia, dysphagia, gastric ulcer,  parkinson's and diabetes who presented to the ED via EMS from our GI office due to significant hypotension and tachycardia.    Dysphagia:  Recent EGD in May while inpatient which revealed moderate Schatzki ring s/p dilation, large hiatal hernia, and 2 large gastric ulcers with inflamed gastric mucosa and biopsies negative for H. Pylori. Advised repeat EGD in 12 weeks to assess for healing. Daughter reported patient had been unable to tolerate p.o intake for 4-5 days and was gagging with solids and even water. Symptoms initially felt to be oropharyngeal. Speech therapy saw patient and completed bedside swallow eval with no s/sx of oropharyngeal dysphagia.  Liquid diet has been resumed and patient seems to be doing fine with swallowing, but continues to have poor p.o. intake.  Spoke with daughter today who states that she can usually get patient to eat better as long as she continues to offer the food.  States patient will say that she is done, but usually if food continues to be offered, she will eat/drink.  Unfortunately, this is a difficult situation in the setting of large hiatal hernia, which is likely contributing to her dysphagia, Sheria Lang ulcers, and also underlying dementia.  Patient is not a surgical candidate for hernia repair.  We have encouraged she continue a liquid diet, have 6-8 small meals daily, and maximize acid reduction with  PPI twice daily and Carafate 4 times daily.    Hypotension/tachycardia:  Felt to be secondary to poor p.o. intake in the setting of dysphagia.  Symptoms resolved with volume resuscitation.  She was found to have a UTI, but this is a local infection, and has been treated.  No leukocytosis and blood cultures have been negative.    Plan: Continue full liquid diet. Recommend 6-8 small meals daily. Continue Protonix 40 mg twice daily while inpatient.  Consider switching to omeprazole twice daily at discharge with recommendations to open capsule contents and mixed with  soft food.  Continue liquid Carafate 4 times daily. No repeat EGD at this time unless evidence of bleeding. GI will sign off. Please re-consult if needed. We will arrange follow-up outpatient.    LOS: 2 days    07/20/2022, 10:21 AM   Ermalinda Memos, PA-C [facility]

## 2022-07-20 NOTE — Telephone Encounter (Signed)
Patient is on recall for 12 week EGD

## 2022-07-20 NOTE — Telephone Encounter (Signed)
Pt was sent to ED at last OV with Dr. Marletta Lor. Does she need OV prior to scheduling procedure?

## 2022-07-20 NOTE — TOC Initial Note (Addendum)
Transition of Care [facility]) - Initial/Assessment Note    Patient Details  Name: [name] MRN: [id number] Date of Birth: 02-16-1940  Transition of Care [facility]) CM/SW Contact:    Leitha Bleak, RN Phone Number: 07/20/2022, 12:24 PM  Addendum:  PASSR completed #  [id number]   Clinical Narrative:    Discussed bed offers with Daughter, Accepted [facility].  CMA started INS AUTH. MD updated. More PASSR information uploaded.      Addendum : Navi wanting updates, CM spoke with daughter, Patient is at baseline, bed bound. They have a hospital bed and lift.  Medicaid is assessing later in the week for CPAP aide hours.  Daughter will plan for patient to come home tomorrow.            Expected Discharge Plan: Skilled Nursing Facility Barriers to Discharge: Insurance Authorization [facility])   Patient Goals and CMS Choice Patient states their goals for this hospitalization and ongoing recovery are:: to go to SNF   Choice offered to / list presented to : Adult Children     Expected Discharge Plan and Services       Prior Living Arrangements/Services     Patient language and need for interpreter reviewed:: Yes        Need for Family Participation in Patient Care: Yes (Comment) Care giver support system in place?: Yes (comment)   Criminal Activity/Legal Involvement Pertinent to Current Situation/Hospitalization: No - Comment as needed  Activities of Daily Living   ADL Screening (condition at time of admission) Patient's cognitive ability adequate to safely complete daily activities?: No Is the patient deaf or have difficulty hearing?: No Does the patient have difficulty seeing, even when wearing glasses/contacts?: No Does the patient have difficulty concentrating, remembering, or making decisions?: Yes Patient able to express need for assistance with ADLs?: No Does the patient have difficulty dressing or bathing?: Yes Independently performs ADLs?: No Does the patient have difficulty  walking or climbing stairs?: Yes Weakness of Legs: Both Weakness of Arms/Hands: Both  Permission Sought/Granted     Emotional Assessment      Admission diagnosis:  Pyelonephritis [N12] Hypotension [I95.9] Hypotension, unspecified hypotension type [I95.9] UTI (urinary tract infection) [N39.0] Patient Active Problem List   Diagnosis Date Noted   Constipation 06/11/2022   Hiatal hernia 05/08/2022   Multiple gastric ulcers 05/08/2022   Hypotension 05/05/2022   UTI (urinary tract infection) 05/05/2022   Dementia (HCC) 05/05/2022   CKD (chronic kidney disease) stage 3, GFR 30-59 ml/min (HCC) 05/05/2022   Diarrhea 02/02/2022   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   Abnormal CT scan 09/05/2021   Dilation of biliary tract 09/05/2021   Nausea and vomiting 01/23/2021   Esophageal dysphagia 08/22/2020   Gastroesophageal reflux disease 08/22/2020   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 AT [address] [place] [address] Phone: [phone] Fax: [phone]     Social Determinants of Health (SDOH) Social History: SDOH Screenings   Food Insecurity: No Food Insecurity (07/16/2022)  Housing: Low Risk  (07/16/2022)  Transportation Needs: No Transportation Needs (07/16/2022)  Utilities: Not At Risk (  07/16/2022)  Alcohol Screen: Low Risk  (02/10/2022)  Depression (PHQ2-9): High Risk (02/10/2022)  Financial Resource Strain: Low Risk  (02/10/2022)  Physical Activity: Inactive (02/10/2022)  Social Connections: Socially Isolated (02/10/2022)  Stress: No Stress Concern  Present (02/10/2022)  Tobacco Use: Low Risk  (07/16/2022)   SDOH Interventions:    Readmission Risk Interventions    07/20/2022   12:18 PM  Readmission Risk Prevention Plan  Transportation Screening Complete  PCP or Specialist Appt within 5-7 Days Complete  Home Care Screening Complete  Medication Review (RN CM) Complete

## 2022-07-20 NOTE — Consult Note (Signed)
Consultation Note Date: 07/20/2022   Patient Name: [name]  DOB: [birth year]  MRN: [id number]  Age / Sex: 82 y.o., female  PCP: [name], MD Referring Physician: Erick Blinks, DO  Reason for Consultation: Establishing goals of care  HPI/Patient Profile: 82 y.o. female  with past medical history of dementia, esophageal dysphagia with EGD and dilation on 5/2 with findings of large hiatal hernia and 2 large ulcers Parkinson's, DM, CKD 3, gastric ulcer, admitted on 07/16/2022 with hypotension/resolved, likely from poor by mouth intake over the past several days secondary to esophageal dysphagia.  Unable to take solids for the past 3 days but able to drink liquids, followed inpatient by GI service.  Recommending full liquid diet 6-8 small meals daily  Clinical Assessment and Goals of Care: I have reviewed medical records including EPIC notes, labs and imaging, received report from RN, assessed the patient.  [name] is lying quietly in bed.  She appears acutely/chronically ill, obese but frail.  She is resting but wakes when I enter the room and call her name.  She will briefly make an somewhat keep eye contact.  She is oriented to person and place, not month.  She is unable to tell me why we are in the hospital.  When I mention that she has trouble swallowing she asks, "is that why?".  I offer her something to drink.  She is able to take without overt signs and symptoms of aspiration.  Call to daughter, Yarielys Beed, to discuss diagnosis prognosis, GOC, EOL wishes, disposition and options.  I introduced Palliative Medicine as specialized medical care for people living with serious illness. It focuses on providing relief from the symptoms and stress of a serious illness. The goal is to improve quality of life for both the patient and the family.  We discussed a brief life review of the patient. Living in her home  with daughter Bonita Quin as main caregiver. Bonita Quin shares that her sister, Harvie Bridge, doesn't understand Linda's limitation in her ability to care for patient.  Bonita Quin, conference in her sister Mae.  We then focused on their current illness.  We talk about swallowing issues, failure to thrive, frailty.  We talk about GI recommendations.  We talked about short-term rehab in detail.  The natural disease trajectory and expectations at EOL were discussed.  Advanced directives, concepts specific to code status, artifical feeding and hydration, and rehospitalization were considered and discussed.  DNR verified.   Hospice and Palliative Care services outpatient were explained and offered.  [name] is active with [facility] for palliative care.  Seen by Coralee Rud, NP.  They have been talking about transitioning to "treat the treatable" hospice care for more services.  Discussed the importance of continued conversation with family and the medical providers regarding overall plan of care and treatment options, ensuring decisions are within the context of the patient's values and GOCs.  Questions and concerns were addressed.   The family was encouraged to call with questions or concerns.  PMT will  continue to support holistically.  Conference with attending, bedside nursing staff, transition of care team related to patient condition, needs, goals of care, disposition.   HCPOA  NEXT OF KIN -daughter, Kaleeyah Cuffie, is main contact.  Also has daughter Harvie Bridge.    SUMMARY OF RECOMMENDATIONS   Continue to treat the treatable but no CPR or intubation. Short-term rehab at [facility] rehab with ultimate goal of returning home. Active with [facility] palliative services, Coralee Rud, NP. Considering transitioning to "treat the treatable" hospice care for more services.    Code Status/Advance Care Planning: DNR  Symptom Management:  Per hospitalist, no additional needs at this time.  Palliative Prophylaxis:  Frequent Pain  Assessment and Oral Care  Additional Recommendations (Limitations, Scope, Preferences): Continue to treat the treatable but no CPR or intubation  Psycho-social/Spiritual:  Desire for further Chaplaincy support:no Additional Recommendations: Caregiving  Support/Resources and Education on Hospice  Prognosis:  Unable to determine, based on outcomes.  6 months or less would not be surprising based on advanced age, decreasing functional status, chronic illness burden, memory loss.  Discharge Planning: Anticipate short-term rehab, active with outpatient palliative     Primary Diagnoses: Present on Admission:  Hypotension  Dementia (HCC)  Diabetes mellitus with stage 3 chronic kidney disease (HCC)  Esophageal dysphagia  Multiple gastric ulcers  CKD (chronic kidney disease) stage 3, GFR 30-59 ml/min (HCC)  UTI (urinary tract infection)   I have reviewed the medical record, interviewed the patient and family, and examined the patient. The following aspects are pertinent.  Past Medical History:  Diagnosis Date   CKD (chronic kidney disease)    Dementia (HCC)    Diabetes mellitus    Diastolic heart failure (HCC)    GERD (gastroesophageal reflux disease)    Hypertension    Parkinson disease    Scoliosis    Scoliosis    UTI (lower urinary tract infection)    Social History   Socioeconomic History   Marital status: Divorced    Spouse name: Not on file   Number of children: Not on file   Years of education: Not on file   Highest education level: Not on file  Occupational History   Not on file  Tobacco Use   Smoking status: Never   Smokeless tobacco: Never  Vaping Use   Vaping status: Never Used  Substance and Sexual Activity   Alcohol use: No    Alcohol/week: 0.0 standard drinks of alcohol   Drug use: No   Sexual activity: Not Currently    Birth control/protection: Post-menopausal, None  Other Topics Concern   Not on file  Social History Narrative   Not on file    Social Determinants of Health   Financial Resource Strain: Low Risk  (02/10/2022)   Overall Financial Resource Strain (CARDIA)    Difficulty of Paying Living Expenses: Not hard at all  Food Insecurity: No Food Insecurity (07/16/2022)   Hunger Vital Sign    Worried About Running Out of Food in the Last Year: Never true    Ran Out of Food in the Last Year: Never true  Transportation Needs: No Transportation Needs (07/16/2022)   PRAPARE - Administrator, Civil Service (Medical): No    Lack of Transportation (Non-Medical): No  Physical Activity: Inactive (02/10/2022)   Exercise Vital Sign    Days of Exercise per Week: 0 days    Minutes of Exercise per Session: 0 min  Stress: No Stress Concern Present (02/10/2022)   [employer]  of Occupational Health - Occupational Stress Questionnaire    Feeling of Stress : Not at all  Social Connections: Socially Isolated (02/10/2022)   Social Connection and Isolation Panel [NHANES]    Frequency of Communication with Friends and Family: Three times a week    Frequency of Social Gatherings with Friends and Family: Twice a week    Attends Religious Services: Never    Database administrator or Organizations: No    Attends Engineer, structural: Never    Marital Status: Divorced   Family History  Problem Relation Age of Onset   Colon cancer Maternal Grandmother        34   Kidney cancer Daughter 99   Ovarian cancer Daughter 12   Scheduled Meds:  insulin aspart  0-9 Units Subcutaneous Q6H   pantoprazole (PROTONIX) IV  40 mg Intravenous Q12H   sucralfate  1 g Oral TID WC & HS   Continuous Infusions: PRN Meds:.acetaminophen **OR** acetaminophen, morphine injection, ondansetron **OR** ondansetron (ZOFRAN) IV, polyethylene glycol Medications Prior to Admission:  Prior to Admission medications   Medication Sig Start Date End Date Taking? Authorizing Provider  acetaminophen (TYLENOL) 325 MG tablet Take 2 tablets (650 mg total) by  mouth every 6 (six) hours as needed for mild pain (or Fever >/= 101). 05/10/22  Yes Emokpae, Courage, MD  acetaminophen-codeine (TYLENOL #3) 300-30 MG tablet Take 1 tablet by mouth 2 (two) times daily. 05/13/22  Yes [provider]  aspirin EC 81 MG tablet Take 1 tablet (81 mg total) by mouth daily with breakfast. 05/10/22  Yes Emokpae, Courage, MD  calcitRIOL (ROCALTROL) 0.25 MCG capsule Take 0.25 mcg by mouth every other day.   Yes [provider]  carvedilol (COREG) 3.125 MG tablet Take 1 tablet (3.125 mg total) by mouth 2 (two) times daily. 05/10/22  Yes Emokpae, Courage, MD  cholecalciferol (VITAMIN D3) 25 MCG (1000 UNIT) tablet Take 1,000 Units by mouth daily.   Yes [provider]  furosemide (LASIX) 20 MG tablet Take 1 tablet (20 mg total) by mouth every morning. 05/10/22  Yes Emokpae, Courage, MD  HUMALOG MIX 50/50 KWIKPEN (50-50) 100 UNIT/ML Kwikpen Take 20-40 Units by mouth 2 (two) times daily before a meal. 40units in the morning and 20 units in the evening 06/01/14  Yes [provider]  omeprazole (PRILOSEC) 20 MG capsule Take 20 mg by mouth 2 (two) times daily. 06/24/22  Yes [provider]  ondansetron (ZOFRAN) 4 MG tablet Take 1 tablet (4 mg total) by mouth every 6 (six) hours as needed for nausea. 05/10/22  Yes Emokpae, Courage, MD  pantoprazole (PROTONIX) 40 MG tablet Take 1 tablet (40 mg total) by mouth 2 (two) times daily. 05/10/22  Yes Emokpae, Courage, MD  potassium chloride (KLOR-CON) 10 MEQ tablet Take 1 tablet (10 mEq total) by mouth daily. Take While taking Lasix/furosemide 05/10/22  Yes Emokpae, Courage, MD  risedronate (ACTONEL) 150 MG tablet Take 150 mg by mouth every 30 (thirty) days. with water on empty stomach, nothing by mouth or lie down for next 30 minutes.   Yes [provider]  senna-docusate (SENOKOT-S) 8.6-50 MG tablet Take 2 tablets by mouth at bedtime. 05/10/22  Yes Emokpae, Courage, MD  simvastatin (ZOCOR) 5 MG tablet Take 5 mg  by mouth at bedtime. 06/01/14  Yes [provider]   No Known Allergies Review of Systems  Unable to perform ROS: Dementia    Physical Exam Vitals and nursing note reviewed.  Constitutional:  General: She is not in acute distress.    Appearance: She is ill-appearing.  HENT:     Mouth/Throat:     Mouth: Mucous membranes are dry.  Cardiovascular:     Rate and Rhythm: Normal rate.  Pulmonary:     Effort: Pulmonary effort is normal. No respiratory distress.  Skin:    General: Skin is warm and dry.  Neurological:     Mental Status: She is alert.     Comments: Oriented to person and place, not month  Psychiatric:        Mood and Affect: Mood normal.        Behavior: Behavior normal.     Comments: Calm and cooperative, not fearful     Vital Signs: BP 103/62 (BP Location: Right Arm)   Pulse 80   Temp 98.3 F (36.8 C) (Oral)   Resp 16   Ht 5\' 5"  (1.651 m)   Wt 72.3 kg   SpO2 97%   BMI 26.52 kg/m  Pain Scale: PAINAD POSS *See Group Information*: S-Acceptable,Sleep, easy to arouse Pain Score: Asleep   SpO2: SpO2: 97 % O2 Device:SpO2: 97 % O2 Flow Rate: .   IO: Intake/output summary:  Intake/Output Summary (Last 24 hours) at 07/20/2022 1225 Last data filed at 07/20/2022 0935 Gross per 24 hour  Intake 20 ml  Output 500 ml  Net -480 ml    LBM:   Baseline Weight: Weight: 72.3 kg Most recent weight: Weight: 72.3 kg     Palliative Assessment/Data:     Time In: 0840 Time Out: 0955 Time Total: 75 minutes  Greater than 50%  of this time was spent counseling and coordinating care related to the above assessment and plan.  Signed by: Katheran Awe, NP   Please contact Palliative Medicine Team phone at [phone] for questions and concerns.  For individual provider: See Loretha Stapler

## 2022-07-20 NOTE — Plan of Care (Signed)
   Problem: Clinical Measurements: Goal: Ability to maintain clinical measurements within normal limits will improve Outcome: Progressing Goal: Will remain free from infection Outcome: Progressing Goal: Diagnostic test results will improve Outcome: Progressing Goal: Respiratory complications will improve Outcome: Progressing Goal: Cardiovascular complication will be avoided Outcome: Progressing   Problem: Elimination: Goal: Will not experience complications related to urinary retention Outcome: Progressing   Problem: Safety: Goal: Ability to remain free from injury will improve Outcome: Progressing   Problem: Skin Integrity: Goal: Risk for impaired skin integrity will decrease Outcome: Progressing   Problem: Education: Goal: Knowledge of General Education information will improve Description: Including pain rating scale, medication(s)/side effects and non-pharmacologic comfort measures Outcome: Not Progressing   Problem: Health Behavior/Discharge Planning: Goal: Ability to manage health-related needs will improve Outcome: Not Progressing   Problem: Activity: Goal: Risk for activity intolerance will decrease Outcome: Not Progressing   Problem: Nutrition: Goal: Adequate nutrition will be maintained Outcome: Not Progressing   Problem: Coping: Goal: Level of anxiety will decrease Outcome: Not Progressing   Problem: Elimination: Goal: Will not experience complications related to bowel motility Outcome: Not Progressing   Problem: Pain Managment: Goal: General experience of comfort will improve Outcome: Not Progressing   

## 2022-07-20 NOTE — [place] FL2 (Signed)
[place] MEDICAID FL2 LEVEL OF CARE FORM     IDENTIFICATION  Patient Name: MAKANA FEIGEL Birthdate: [birth year] Sex: female Admission Date (Current Location): 07/16/2022  [place] and [place] Number:  Aaron Edelman [id number] [facility] Facility and Address:  [facility],  [address], [place] [address]      Provider Number: [id number]  Attending Physician Name and Address:  Erick Blinks, DO  Relative Name and Phone Number:  Theora, Vankirk (Daughter)  [phone] (Home Phone    Current Level of Care: Hospital Recommended Level of Care: Skilled Nursing Facility Prior Approval Number:    Date Approved/Denied:   PASRR Number: Pending  Discharge Plan: SNF    Current Diagnoses: Patient Active Problem List   Diagnosis Date Noted   Constipation 06/11/2022   Hiatal hernia 05/08/2022   Multiple gastric ulcers 05/08/2022   Hypotension 05/05/2022   UTI (urinary tract infection) 05/05/2022   Dementia (HCC) 05/05/2022   CKD (chronic kidney disease) stage 3, GFR 30-59 ml/min (HCC) 05/05/2022   Diarrhea 02/02/2022   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   Abnormal CT scan 09/05/2021   Dilation of biliary tract 09/05/2021   Nausea and vomiting 01/23/2021   Esophageal dysphagia 08/22/2020   Gastroesophageal reflux disease 08/22/2020   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   Syncope 10/12/2014   Diabetes mellitus with stage 3 chronic kidney disease (HCC) 10/12/2014   Hypertension 10/12/2014   OSTEOARTHRITIS, 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   TRIGGER FINGER 04/27/2007    Orientation RESPIRATION BLADDER Height & Weight     Self  Normal Incontinent Weight: 72.3 kg Height:  5\' 5"  (165.1 cm)  BEHAVIORAL SYMPTOMS/MOOD NEUROLOGICAL BOWEL NUTRITION STATUS      Incontinent Diet  AMBULATORY STATUS COMMUNICATION OF NEEDS Skin   Extensive Assist Verbally Other (Comment) (Echomymosis)                       Personal Care Assistance Level of Assistance  Bathing, Dressing Bathing  Assistance: Maximum assistance   Dressing Assistance: Maximum assistance     Functional Limitations Info  Sight Sight Info: Impaired (Glasses)        SPECIAL CARE FACTORS FREQUENCY  PT (By licensed PT), OT (By licensed OT)     PT Frequency: 5 X week OT Frequency: 5X week            Contractures Contractures Info: Not present    Additional Factors Info  Code Status (DNR) Code Status Info: DNR             Current Medications (07/20/2022):  This is the current hospital active medication list Current Facility-Administered Medications  Medication Dose Route Frequency Provider Last Rate Last Admin   acetaminophen (TYLENOL) tablet 650 mg  650 mg Oral Q6H PRN Emokpae, Ejiroghene E, MD   650 mg at 07/19/22 2119   Or   acetaminophen (TYLENOL) suppository 650 mg  650 mg Rectal Q6H PRN Emokpae, Ejiroghene E, MD       insulin aspart (novoLOG) injection 0-9 Units  0-9 Units Subcutaneous Q6H Emokpae, Ejiroghene E, MD   1 Units at 07/19/22 1808   morphine (PF) 2 MG/ML injection 1 mg  1 mg Intravenous Q4H PRN Emokpae, Ejiroghene E, MD   1 mg at 07/20/22 0149   ondansetron (ZOFRAN) tablet 4 mg  4 mg Oral Q6H PRN Emokpae, Ejiroghene E, MD       Or   ondansetron (ZOFRAN) injection 4 mg  4 mg Intravenous Q6H PRN Emokpae, Heloise Beecham, MD  pantoprazole (PROTONIX) injection 40 mg  40 mg Intravenous Q12H Shah, Pratik D, DO   40 mg at 07/20/22 0839   polyethylene glycol (MIRALAX / GLYCOLAX) packet 17 g  17 g Oral Daily PRN Emokpae, Ejiroghene E, MD       sucralfate (CARAFATE) 1 GM/10ML suspension 1 g  1 g Oral TID WC & HS Shah, Pratik D, DO   1 g at 07/20/22 5784     Discharge Medications: Please see discharge summary for a list of discharge medications.  Relevant Imaging Results:  Relevant Lab Results:   Additional Information [id number]  Leitha Bleak, RN

## 2022-07-21 ENCOUNTER — Telehealth: Payer: Self-pay | Admitting: Gastroenterology

## 2022-07-21 DIAGNOSIS — R1319 Other dysphagia: Secondary | ICD-10-CM | POA: Diagnosis not present

## 2022-07-21 DIAGNOSIS — E861 Hypovolemia: Secondary | ICD-10-CM | POA: Diagnosis not present

## 2022-07-21 DIAGNOSIS — K259 Gastric ulcer, unspecified as acute or chronic, without hemorrhage or perforation: Secondary | ICD-10-CM | POA: Diagnosis not present

## 2022-07-21 LAB — CULTURE, BLOOD (ROUTINE X 2)
Culture: NO GROWTH
Special Requests: ADEQUATE

## 2022-07-21 LAB — GLUCOSE, CAPILLARY
Glucose-Capillary: 110 mg/dL — ABNORMAL HIGH (ref 70–99)
Glucose-Capillary: 138 mg/dL — ABNORMAL HIGH (ref 70–99)
Glucose-Capillary: 159 mg/dL — ABNORMAL HIGH (ref 70–99)

## 2022-07-21 MED ORDER — PNEUMOCOCCAL 20-VAL CONJ VACC 0.5 ML IM SUSY: 0.5000 mL | PREFILLED_SYRINGE | INTRAMUSCULAR | Status: DC

## 2022-07-21 NOTE — Progress Notes (Signed)
PROGRESS NOTE    [name]  UJW:[id number] DOB: [birth year] DOA: 07/16/2022 PCP: [name], MD   Brief Narrative:  [name] is a 82 y.o. female with medical history significant for CKD stage III, dementia, esophageal dysphagia, gastric ulcer, Parkinson's, diabetes mellitus who was sent to the ED from GI office with reports of hypotension with systolic in the 80s.   Over the past 3 to 4 days before admission, patient reportedly unable to swallow solids. Patient is bedbound and unable to ambulate.     Recent hospitalization 4/30 to 5/5 for sepsis secondary to pyelonephritis. Also with esophageal dysphagia, underwent EGD with dilatation 5/2, with findings of large hiatal hernia, and 2 large ulcers in the cardia.   On presentation Blood pressure down to 64/49, improved after 2x 1L LR boluses. WBC 7.1.  Lactic acidosis 3.4  > 3 > 2.7, alongside volume resuscitation. 7/11 CT A/P with contrast shows small bilateral pleural effusion, large hiatal hernia. UA with small leukocytes and many bacteria, and completed 3 day course of IV Cefipime (last pyelo infxn in May was resistant to Cipro). Urine culture grew E.coli and Pseudomonas. For most of her hospitalization, [name] wasn't able to provide a clear account of her symptoms, and was oriented to self and immediate location "bed," but not hospital, or time. Her BP normalized after boluses and she is now on full liquid diet per SLP recommendations. GI not planning for EGD unless pt develops bleeding, but has recommended discharge with Omeprazole BID and Carafate QID along with 6-8 small meals each day. PT recommended SNF placement, pt awaiting.   Assessment & Plan:   Principal Problem:   Hypotension Active Problems:   Diabetes mellitus with stage 3 chronic kidney disease (HCC)   Esophageal dysphagia   UTI (urinary tract infection)   Dementia (HCC)   CKD (chronic kidney disease) stage 3, GFR 30-59 ml/min (HCC)   Multiple gastric  ulcers  Assessment and Plan: Hypotension-resolved Hypotension likely from poor oral intake over the past several days 2/2 dysphagia. Hypotension improved after volume resuscitation and mIVF with lactic acidosis resolving from peak 3.8. Not concerned for systemic infection with otherwise normal vital signs and no leukocytosis, though did have local UTI, as below. BP ranging 94/67-123/58, holding home Carvedilol 3.125mg  BID and Lasix. - Continue to hold carvedilol and Lasix until further improving oral intake  Esophageal dysphagia Unable to take solids for 3 days prior to admission (reportedly gagging), but able to drink liquids. Underwent EGD with dilatation 5/2 of moderate Schatzki ring, large hiatal hernia, 2 large ulcers in cardia. Was seen by speech pathology and GI, who ruled out oropharyngeal dysphagia, and ultimately recommended full liquid diet, small meals 6-8/day - Full liquid diet - CBG q. 8 hourly - IV Protonix 40 twice daily, discharge with Omeprazole BID - Carafate 4 times daily - GI does not plan for EGD unless bleeding   E. coli and Pseudomonas UTI Recent urine culture 04/2022, resistant to Cipro otherwise pansensitive. Urine culture resulted with E.coli and Pseudomonas. She is now s/p 3 days Cefepime and has completed treatment. 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  Creatinine stable 1.48.  Dementia [facility]) Per daughter- Mild to moderate. Has good and bad days, she is able to recognize family, but on bad days her speech is confused. Is Alert and oriented x2 this AM. Have spoken with daughter.  -  Delirium precautions - Will continue to monitor - Appreciate PT evaluation recommending SNF and placement pending - Appreciate palliative care consultation for goals of care, she appears to be active with palliative outpatient  Diabetes mellitus with stage 3 chronic kidney  disease (HCC) On humalog 50-50, 20 - 40U BID at home.  A1c 8.3. Currently on full liquid diet and sugars ranging 107-160 while on SSI sensitive.  - Will continue to monitor CBG  - Hold home insulin - SSI- S q6h   DVT prophylaxis: SCDs  Code Status: DNR Family Communication: Daughter [name] [facility]) 7/14  Consultants:  GI Palliative Care  Procedures:  None  Antimicrobials:  IV Cefipime 7/11-7/14   Subjective: Patient seen and evaluated today with no new acute complaints or concerns.  She appears overall less confused this morning (has history of dementia).  She appears to be tolerating full liquid diet, and denies any concerns today.  Objective: Vitals:   07/20/22 1320 07/20/22 2016 07/21/22 0529 07/21/22 1417  BP: 94/67 (!) 123/58 119/63 107/62  Pulse: 73 72 89 90  Resp: 14 20 18    Temp: 97.6 F (36.4 C) 98.3 F (36.8 C) 98.1 F (36.7 C)   TempSrc: Oral Oral    SpO2: 100% 100% 100% 100%  Weight:      Height:        Intake/Output Summary (Last 24 hours) at 07/21/2022 1525 Last data filed at 07/21/2022 1500 Gross per 24 hour  Intake 480 ml  Output 200 ml  Net 280 ml   Filed Weights   07/16/22 2228  Weight: 72.3 kg    Examination:  General exam: Appears calm and comfortable, confused Respiratory system: Clear to auscultation. Respiratory effort normal. Cardiovascular system: S1 & S2 heard, RRR.  Gastrointestinal system: Abdomen is soft, nontender, bowel sounds present Central nervous system: Alert and awake, confused at baseline Extremities: Trace BLE edema Skin: No significant lesions noted Psychiatry: Flat affect.   Data Reviewed: I have personally reviewed following labs and imaging studies  CBC: Recent Labs  Lab 07/16/22 1742 07/17/22 0055 07/18/22 0516 07/19/22 0508 07/20/22 0509  WBC 7.1 8.6 7.5 7.6 5.8  NEUTROABS 5.8  --   --   --   --   HGB 10.8* 11.9* 10.3* 11.9* 10.2*  HCT 31.3* 35.4* 29.6* 35.8* 29.7*  MCV 84.6 86.3 83.6 87.3  84.6  PLT 185 180 184 190 183   Basic Metabolic Panel: Recent Labs  Lab 07/16/22 1742 07/17/22 0055 07/18/22 0516 07/19/22 0508 07/20/22 0509  NA 144 146* 144 143 145  K 3.5 3.7 3.1* 3.7 3.5  CL 113* 115* 114* 111 114*  CO2 23 24 21* 20* 24  GLUCOSE 216* 200* 120* 76 101*  BUN 18 19 20 18 17   CREATININE 1.40* 1.46* 1.40* 1.44* 1.48*  CALCIUM 8.0* 8.3* 7.9* 8.1* 8.0*  MG  --   --  1.6* 2.1 1.9   GFR: Estimated Creatinine Clearance: 29.7 mL/min (A) (by C-G formula based on SCr of 1.48 mg/dL (H)). Liver Function Tests: Recent Labs  Lab 07/16/22 1742 07/18/22 0516  AST 26 17  ALT 17 14  ALKPHOS 81 76  BILITOT 1.1 0.9  PROT 4.5* 4.3*  ALBUMIN <1.5* <1.5*   No results for input(s): "LIPASE", "AMYLASE" in the last 168 hours. No results for input(s): "AMMONIA" in the last 168 hours. Coagulation Profile: Recent Labs  Lab 07/16/22 1742  INR 1.0   Cardiac Enzymes: No results for input(s): "CKTOTAL", "CKMB", "CKMBINDEX", "TROPONINI" in the  last 168 hours. BNP (last 3 results) No results for input(s): "PROBNP" in the last 8760 hours. HbA1C: No results for input(s): "HGBA1C" in the last 72 hours. CBG: Recent Labs  Lab 07/20/22 1828 07/20/22 2013 07/21/22 0030 07/21/22 0525 07/21/22 1113  GLUCAP 142* 160* 138* 110* 159*   Lipid Profile: No results for input(s): "CHOL", "HDL", "LDLCALC", "TRIG", "CHOLHDL", "LDLDIRECT" in the last 72 hours. Thyroid Function Tests: No results for input(s): "TSH", "T4TOTAL", "FREET4", "T3FREE", "THYROIDAB" in the last 72 hours. Anemia Panel: No results for input(s): "VITAMINB12", "FOLATE", "FERRITIN", "TIBC", "IRON", "RETICCTPCT" in the last 72 hours. Sepsis Labs: Recent Labs  Lab 07/16/22 1630 07/16/22 1742 07/17/22 0055 07/17/22 1221  LATICACIDVEN 3.4* 3.0* 2.7* 1.5    Recent Results (from the past 240 hour(s))  Culture, blood (Routine x 2)     Status: None   Collection Time: 07/16/22  4:15 PM   Specimen: BLOOD  Result  Value Ref Range Status   Specimen Description BLOOD BLOOD LEFT WRIST  Final   Special Requests   Final    BOTTLES DRAWN AEROBIC ONLY Blood Culture results may not be optimal due to an inadequate volume of blood received in culture bottles   Culture   Final    NO GROWTH 5 DAYS Performed at [facility], [address], [place], [place] [address]    Report Status 07/21/2022 FINAL  Final  Culture, blood (Routine x 2)     Status: None   Collection Time: 07/16/22  4:27 PM   Specimen: BLOOD  Result Value Ref Range Status   Specimen Description BLOOD BLOOD RIGHT ARM  Final   Special Requests   Final    BOTTLES DRAWN AEROBIC ONLY Blood Culture adequate volume   Culture   Final    NO GROWTH 5 DAYS Performed at [facility], [address] [place], [place] [address]    Report Status 07/21/2022 FINAL  Final  Urine Culture     Status: Abnormal   Collection Time: 07/16/22  4:40 PM   Specimen: Urine, Random  Result Value Ref Range Status   Specimen Description   Final    URINE, RANDOM Performed at [facility], [address], [place], [place] [address]    Special Requests   Final    NONE Reflexed from [id number] Performed at [facility], [address], [place], [place] [address]    Culture (A)  Final    >=100,000 COLONIES/mL ESCHERICHIA COLI >=100,000 COLONIES/mL PSEUDOMONAS AERUGINOSA    Report Status 07/19/2022 FINAL  Final   Organism ID, Bacteria ESCHERICHIA COLI (A)  Final   Organism ID, Bacteria PSEUDOMONAS AERUGINOSA (A)  Final      Susceptibility   Escherichia coli - MIC*    AMPICILLIN 16 INTERMEDIATE Intermediate     CEFAZOLIN <=4 SENSITIVE Sensitive     CEFEPIME <=0.12 SENSITIVE Sensitive     CEFTRIAXONE <=0.25 SENSITIVE Sensitive     CIPROFLOXACIN <=0.25 SENSITIVE Sensitive     GENTAMICIN <=1 SENSITIVE Sensitive     IMIPENEM <=0.25 SENSITIVE Sensitive     NITROFURANTOIN <=16 SENSITIVE Sensitive     TRIMETH/SULFA <=20 SENSITIVE Sensitive     AMPICILLIN/SULBACTAM 4 SENSITIVE  Sensitive     PIP/TAZO <=4 SENSITIVE Sensitive     * >=100,000 COLONIES/mL ESCHERICHIA COLI   Pseudomonas aeruginosa - MIC*    CEFTAZIDIME 2 SENSITIVE Sensitive     CIPROFLOXACIN <=0.25 SENSITIVE Sensitive     GENTAMICIN <=1 SENSITIVE Sensitive     IMIPENEM 2 SENSITIVE Sensitive  PIP/TAZO 8 SENSITIVE Sensitive     CEFEPIME 1 SENSITIVE Sensitive     * >=100,000 COLONIES/mL PSEUDOMONAS AERUGINOSA  Resp panel by RT-PCR (RSV, Flu A&B, Covid) Anterior Nasal Swab     Status: None   Collection Time: 07/16/22  7:53 PM   Specimen: Anterior Nasal Swab  Result Value Ref Range Status   SARS Coronavirus 2 by RT PCR NEGATIVE NEGATIVE Final    Comment: (NOTE) SARS-CoV-2 target nucleic acids are NOT DETECTED.  The SARS-CoV-2 RNA is generally detectable in upper respiratory specimens during the acute phase of infection. The lowest concentration of SARS-CoV-2 viral copies this assay can detect is 138 copies/mL. A negative result does not preclude SARS-Cov-2 infection and should not be used as the sole basis for treatment or other patient management decisions. A negative result may occur with  improper specimen collection/handling, submission of specimen other than nasopharyngeal swab, presence of viral mutation(s) within the areas targeted by this assay, and inadequate number of viral copies(<138 copies/mL). A negative result must be combined with clinical observations, patient history, and epidemiological information. The expected result is Negative.  Fact Sheet for Patients:  BloggerCourse.com  Fact Sheet for Healthcare Providers:  SeriousBroker.it  This test is no t yet approved or cleared by the [place] FDA and  has been authorized for detection and/or diagnosis of SARS-CoV-2 by FDA under an Emergency Use Authorization (EUA). This EUA will remain  in effect (meaning this test can be used) for the duration of the COVID-19  declaration under Section 564(b)(1) of the Act, 21 U.S.C.section 360bbb-3(b)(1), unless the authorization is terminated  or revoked sooner.       Influenza A by PCR NEGATIVE NEGATIVE Final   Influenza B by PCR NEGATIVE NEGATIVE Final    Comment: (NOTE) The Xpert Xpress SARS-CoV-2/FLU/RSV plus assay is intended as an aid in the diagnosis of influenza from Nasopharyngeal swab specimens and should not be used as a sole basis for treatment. Nasal washings and aspirates are unacceptable for Xpert Xpress SARS-CoV-2/FLU/RSV testing.  Fact Sheet for Patients: BloggerCourse.com  Fact Sheet for Healthcare Providers: SeriousBroker.it  This test is not yet approved or cleared by the [place] FDA and has been authorized for detection and/or diagnosis of SARS-CoV-2 by FDA under an Emergency Use Authorization (EUA). This EUA will remain in effect (meaning this test can be used) for the duration of the COVID-19 declaration under Section 564(b)(1) of the Act, 21 U.S.C. section 360bbb-3(b)(1), unless the authorization is terminated or revoked.     Resp Syncytial Virus by PCR NEGATIVE NEGATIVE Final    Comment: (NOTE) Fact Sheet for Patients: BloggerCourse.com  Fact Sheet for Healthcare Providers: SeriousBroker.it  This test is not yet approved or cleared by the [place] FDA and has been authorized for detection and/or diagnosis of SARS-CoV-2 by FDA under an Emergency Use Authorization (EUA). This EUA will remain in effect (meaning this test can be used) for the duration of the COVID-19 declaration under Section 564(b)(1) of the Act, 21 U.S.C. section 360bbb-3(b)(1), unless the authorization is terminated or revoked.  Performed at [facility], [address] [place], [place] [address]          Radiology Studies: No results found.      Scheduled Meds:  insulin aspart   0-9 Units Subcutaneous Q6H   pantoprazole (PROTONIX) IV  40 mg Intravenous Q12H   [START ON 07/22/2022] pneumococcal 20-valent conjugate vaccine  0.5 mL Intramuscular Tomorrow-1000   sucralfate  1 g Oral TID  WC & HS   Continuous Infusions:     LOS: 3 days    Time spent: 35 minutes  Signed,  Marcelline Mates, MS4 Working with Dr. Maurilio Lovely

## 2022-07-21 NOTE — Plan of Care (Signed)
  Problem: Activity: Goal: Risk for activity intolerance will decrease 07/21/2022 0710 by Sundra Aland, RN Outcome: Progressing 07/21/2022 0707 by Sundra Aland, RN Outcome: Progressing   Problem: Nutrition: Goal: Adequate nutrition will be maintained Outcome: Progressing   Problem: Health Behavior/Discharge Planning: Goal: Ability to manage health-related needs will improve Outcome: Progressing   Problem: Metabolic: Goal: Ability to maintain appropriate glucose levels will improve Outcome: Progressing

## 2022-07-21 NOTE — Progress Notes (Signed)
Patient appears more alert today and ate over 50% over her meals today. She did hold her own juice today. No events today.

## 2022-07-21 NOTE — Telephone Encounter (Signed)
Patient current admitted to the hospital. We have signed off. Please arrange follow-up with Dr. Marletta Lor in 2-3 weeks. If he has nothing in 2 weeks, he should have some openings 1st week of August once schedule is released. Otherwise, schedule with APP.

## 2022-07-21 NOTE — TOC Progression Note (Addendum)
Transition of Care [facility]) - Progression Note    Patient Details  Name: [name] MRN: [id number] Date of Birth: [birth year]  [facility] of [facility]) CM/SW Contact  Leitha Bleak, RN Phone Number: 07/21/2022, 3:28 PM  Clinical Narrative:  Berkley Harvey still pending in [employer], medical director reviewing. CM updated her daughter, Bonita Quin.  TOC follow up with Faylene Million wants rehab if approved, she will take home if denied.  Patient is active with [facility] out patient Palliative.   Expected Discharge Plan: Skilled Nursing Facility Barriers to Discharge: Insurance Authorization Aeronautical engineer)  Expected Discharge Plan and Services                Social Determinants of Health (SDOH) Interventions SDOH Screenings   Food Insecurity: No Food Insecurity (07/16/2022)  Housing: Low Risk  (07/16/2022)  Transportation Needs: No Transportation Needs (07/16/2022)  Utilities: Not At Risk (07/16/2022)  Alcohol Screen: Low Risk  (02/10/2022)  Depression (PHQ2-9): High Risk (02/10/2022)  Financial Resource Strain: Low Risk  (02/10/2022)  Physical Activity: Inactive (02/10/2022)  Social Connections: Socially Isolated (02/10/2022)  Stress: No Stress Concern Present (02/10/2022)  Tobacco Use: Low Risk  (07/20/2022)    Readmission Risk Interventions    07/20/2022   12:18 PM  Readmission Risk Prevention Plan  Transportation Screening Complete  PCP or Specialist Appt within 5-7 Days Complete  Home Care Screening Complete  Medication Review (RN CM) Complete

## 2022-07-21 NOTE — Discharge Summary (Addendum)
Physician Discharge Summary  [name] WJX:[id number] DOB: 1940-06-23 DOA: 07/16/2022  PCP: [name], MD  Admit date: 07/16/2022  Discharge date: 07/22/2022  Admitted From: Home  Disposition:  Home  Recommendations for Outpatient Follow-up:  Follow up with PCP in 1-2 weeks GI asked that you follow up with Dr. Marletta Lor in 2-3 weeks, or an Advanced Practice Provider  Please obtain BMP/CBC in one week Continue eating full liquid diet of 6-8 small meals daily Continue taking Omeprazole twice a day, and Carafate QID Continue follow up outpatient with [facility] Here are dressing change instructions from our wound care nurse: Minimize time lying down. Desitin ointment is to be applied in the bilateral groin, and to the buttocks, perineal area, and both inner and back of thighs. Use a Normal Saline cleanse, pat dry, and cover with xeroform (nonadherent, antimicrobial) gauze, topped with dry gauze and secured with silicone foam. Change the xeroform daily, the silicone foam may be reused for up to 3 days and changed as needed when it becomes soiled. Follow up with [facility]  Home Health: None  Equipment/Devices: None  Discharge Condition:Stable  CODE STATUS: Full  Diet recommendation: Heart Healthy  Brief/Interim Summary: Colleen HRITZ is a 82 y.o. female with medical history significant for CKD stage III, dementia, esophageal dysphagia, gastric ulcer, Parkinson's, diabetes mellitus who was sent to the ED from GI office with reports of hypotension with systolic in the 80s.   Over the past 3 to 4 days before admission, patient reportedly unable to swallow solids. Patient is bedbound and unable to ambulate.     Recent hospitalization 4/30 to 5/5 for sepsis secondary to pyelonephritis. Also with esophageal dysphagia, underwent EGD with dilatation 5/2, with findings of large hiatal hernia, and 2 large ulcers in the cardia.   On presentation Blood pressure down to  64/49, improved after 2x 1L LR boluses. WBC 7.1.  Lactic acidosis 3.4  > 3 > 2.7, alongside volume resuscitation. 7/11 CT A/P with contrast shows small bilateral pleural effusion, large hiatal hernia. UA with small leukocytes and many bacteria, and completed 3 day course of IV Cefipime (last pyelo infxn in May was resistant to Cipro). Urine culture grew E.coli and Pseudomonas. For most of her hospitalization, [name] wasn't able to provide a clear account of her symptoms, and was oriented to self and immediate location "bed," but not hospital, or time. Her BP normalized after boluses and she is now on full liquid diet per SLP recommendations. GI not planning for EGD unless pt develops bleeding, but has recommended discharge with Omeprazole BID and Carafate QID along with 6-8 small meals each day. She was returned to baseline by her day of discharge.  Discharge Diagnoses:  Principal Problem:   Hypotension Active Problems:   Diabetes mellitus with stage 3 chronic kidney disease (HCC)   Esophageal dysphagia   UTI (urinary tract infection)   Dementia (HCC)   CKD (chronic kidney disease) stage 3, GFR 30-59 ml/min (HCC)   Multiple gastric ulcers    Discharge Instructions   Allergies as of 07/22/2022   No Known Allergies      Medication List     STOP taking these medications    acetaminophen-codeine 300-30 MG tablet Commonly known as: TYLENOL #3   carvedilol 3.125 MG tablet Commonly known as: COREG   furosemide 20 MG tablet Commonly known as: LASIX   pantoprazole 40 MG tablet Commonly known as: PROTONIX       TAKE these  medications    acetaminophen 325 MG tablet Commonly known as: TYLENOL Take 2 tablets (650 mg total) by mouth every 6 (six) hours as needed for mild pain (or Fever >/= 101).   aspirin EC 81 MG tablet Take 1 tablet (81 mg total) by mouth daily with breakfast.   calcitRIOL 0.25 MCG capsule Commonly known as: ROCALTROL Take 0.25 mcg by mouth every other  day.   cholecalciferol 25 MCG (1000 UNIT) tablet Commonly known as: VITAMIN D3 Take 1,000 Units by mouth daily.   HumaLOG Mix 50/50 KwikPen (50-50) 100 UNIT/ML KwikPen Generic drug: Insulin Lispro Prot & Lispro Take 20-40 Units by mouth 2 (two) times daily before a meal. 40units in the morning and 20 units in the evening   liver oil-zinc oxide 40 % ointment Commonly known as: DESITIN Apply topically 3 (three) times daily.   omeprazole 20 MG capsule Commonly known as: PRILOSEC Take 20 mg by mouth 2 (two) times daily.   ondansetron 4 MG tablet Commonly known as: ZOFRAN Take 1 tablet (4 mg total) by mouth every 6 (six) hours as needed for nausea.   potassium chloride 10 MEQ tablet Commonly known as: KLOR-CON Take 1 tablet (10 mEq total) by mouth daily. Take While taking Lasix/furosemide   risedronate 150 MG tablet Commonly known as: ACTONEL Take 150 mg by mouth every 30 (thirty) days. with water on empty stomach, nothing by mouth or lie down for next 30 minutes.   senna-docusate 8.6-50 MG tablet Commonly known as: Senokot-S Take 2 tablets by mouth at bedtime.   simvastatin 5 MG tablet Commonly known as: ZOCOR Take 5 mg by mouth at bedtime.   sucralfate 1 GM/10ML suspension Commonly known as: CARAFATE Take 10 mLs (1 g total) by mouth 4 (four) times daily -  with meals and at bedtime.        Contact information for follow-up providers     [name], MD. Schedule an appointment as soon as possible for a visit in 1 week(s).   Specialty: Family Medicine Contact information: [address] [place] [address] [phone]              Contact information for after-discharge care     Destination     [facility] .   Service: Skilled Nursing Contact information: [address] [place] [address] [phone]                    No Known Allergies  Consultations: Gastroenterology Palliative  Care   Procedures/Studies: CT ABDOMEN PELVIS W CONTRAST  Result Date: 07/16/2022 CLINICAL DATA:  Abdominal/flank pain, stone suspected Sepsis EXAM: CT ABDOMEN AND PELVIS WITH CONTRAST TECHNIQUE: Multidetector CT imaging of the abdomen and pelvis was performed using the standard protocol following bolus administration of intravenous contrast. RADIATION DOSE REDUCTION: This exam was performed according to the departmental dose-optimization program which includes automated exposure control, adjustment of the mA and/or kV according to patient size and/or use of iterative reconstruction technique. CONTRAST:  80mL OMNIPAQUE IOHEXOL 300 MG/ML  SOLN COMPARISON:  04/17/2022 FINDINGS: Lower chest: Small bilateral pleural effusions. Compressive atelectasis in the lower lobes. Large hiatal hernia. Coronary artery and aortic calcifications. Hepatobiliary: Diffuse fatty infiltration of the liver. Intrahepatic and extrahepatic biliary ductal dilatation is stable since prior study. Pancreas: No focal abnormality or ductal dilatation. Spleen: No focal abnormality.  Normal size. 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 Left adrenal gland unremarkable. Scarring throughout the right kidney. Small cysts in the kidneys bilaterally. No follow-up imaging recommended. No hydronephrosis. Urinary bladder unremarkable. Stomach/Bowel: Left colonic diverticulosis. No active diverticulitis. Stomach and small bowel decompressed, unremarkable. Vascular/Lymphatic: Aortic atherosclerosis. No evidence of aneurysm or adenopathy. Reproductive: Uterus and right ovary unremarkable. 7 cm cyst in the left ovary, unchanged. Other: No free fluid or free air. Musculoskeletal: Degenerative disc disease and facet disease in the lower lumbar spine. No acute bony abnormality. IMPRESSION: Small bilateral pleural effusions. Compressive atelectasis in the lower lobes. Large hiatal  hernia. Severe diffuse fatty infiltration of the liver. Stable biliary ductal dilatation. Left colonic diverticulosis.  No active diverticulitis. 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 Stable 7 cm left ovarian cystic lesion. Electronically Signed   By: Charlett Nose M.D.   On: 07/16/2022 19:13   DG Chest Port 1 View  Result Date: 07/16/2022 CLINICAL DATA:  Altered mental status. Hypotension. Sepsis question. EXAM: PORTABLE CHEST 1 VIEW COMPARISON:  05/29/2022 FINDINGS: The heart is normal in size. Large hiatal hernia courses into the left lung base with adjacent chronic atelectasis and small amount of pleural fluid. No new airspace disease. No pulmonary edema. No pneumothorax. Chronic left shoulder arthropathy. IMPRESSION: Large hiatal hernia with adjacent chronic atelectasis and small amount of pleural fluid at the left lung base. No acute findings. Electronically Signed   By: Narda Rutherford M.D.   On: 07/16/2022 17:35     Discharge Exam: Vitals:   07/22/22 1219 07/22/22 1220  BP:    Pulse: (!) 103 100  Resp:    Temp:    SpO2: 100% 100%   Vitals:   07/22/22 0700 07/22/22 1219 07/22/22 1219 07/22/22 1220  BP:  97/77    Pulse:  (!) 109 (!) 103 100  Resp: (!) 23 18    Temp:  98.8 F (37.1 C)    TempSrc:      SpO2:  100% 100% 100%  Weight:      Height:        General: Pt is awake, mildly attentive, not in acute distress  Cardiovascular: RRR, S1/S2 +, no rubs, no gallops Respiratory: CTA bilaterally, no wheezing, no rhonchi Abdominal: Soft, NT, ND, bowel sounds + Extremities: trace bilateral lower extremity edema, no cyanosis    The results of significant diagnostics from this hospitalization (including imaging, microbiology, ancillary and laboratory) are listed below for reference.     Microbiology: Recent Results (from the past 240 hour(s))  Culture, blood (Routine x 2)     Status: None   Collection Time: 07/16/22  4:15 PM   Specimen: BLOOD  Result Value  Ref Range Status   Specimen Description BLOOD BLOOD LEFT WRIST  Final   Special Requests   Final    BOTTLES DRAWN AEROBIC ONLY Blood Culture results may not be optimal due to an inadequate volume of blood received in culture bottles   Culture   Final    NO GROWTH 5 DAYS Performed at [place] [facility], [address] [place], [place] [address]    Report Status 07/21/2022 FINAL  Final  Culture, blood (Routine x 2)     Status: None   Collection Time: 07/16/22  4:27 PM   Specimen: BLOOD  Result Value Ref Range Status   Specimen Description BLOOD BLOOD RIGHT ARM  Final   Special Requests   Final    BOTTLES DRAWN AEROBIC ONLY Blood Culture adequate volume   Culture   Final  NO GROWTH 5 DAYS Performed at [facility], [address], [place], [place] [address]    Report Status 07/21/2022 FINAL  Final  Urine Culture     Status: Abnormal   Collection Time: 07/16/22  4:40 PM   Specimen: Urine, Random  Result Value Ref Range Status   Specimen Description   Final    URINE, RANDOM Performed at [facility], [address] [place], [place] [address]    Special Requests   Final    NONE Reflexed from [id number] Performed at [facility], [address], [place], [place] [address]    Culture (A)  Final    >=100,000 COLONIES/mL ESCHERICHIA COLI >=100,000 COLONIES/mL PSEUDOMONAS AERUGINOSA    Report Status 07/19/2022 FINAL  Final   Organism ID, Bacteria ESCHERICHIA COLI (A)  Final   Organism ID, Bacteria PSEUDOMONAS AERUGINOSA (A)  Final      Susceptibility   Escherichia coli - MIC*    AMPICILLIN 16 INTERMEDIATE Intermediate     CEFAZOLIN <=4 SENSITIVE Sensitive     CEFEPIME <=0.12 SENSITIVE Sensitive     CEFTRIAXONE <=0.25 SENSITIVE Sensitive     CIPROFLOXACIN <=0.25 SENSITIVE Sensitive     GENTAMICIN <=1 SENSITIVE Sensitive     IMIPENEM <=0.25 SENSITIVE Sensitive     NITROFURANTOIN <=16 SENSITIVE Sensitive     TRIMETH/SULFA <=20 SENSITIVE Sensitive     AMPICILLIN/SULBACTAM 4 SENSITIVE  Sensitive     PIP/TAZO <=4 SENSITIVE Sensitive     * >=100,000 COLONIES/mL ESCHERICHIA COLI   Pseudomonas aeruginosa - MIC*    CEFTAZIDIME 2 SENSITIVE Sensitive     CIPROFLOXACIN <=0.25 SENSITIVE Sensitive     GENTAMICIN <=1 SENSITIVE Sensitive     IMIPENEM 2 SENSITIVE Sensitive     PIP/TAZO 8 SENSITIVE Sensitive     CEFEPIME 1 SENSITIVE Sensitive     * >=100,000 COLONIES/mL PSEUDOMONAS AERUGINOSA  Resp panel by RT-PCR (RSV, Flu A&B, Covid) Anterior Nasal Swab     Status: None   Collection Time: 07/16/22  7:53 PM   Specimen: Anterior Nasal Swab  Result Value Ref Range Status   SARS Coronavirus 2 by RT PCR NEGATIVE NEGATIVE Final    Comment: (NOTE) SARS-CoV-2 target nucleic acids are NOT DETECTED.  The SARS-CoV-2 RNA is generally detectable in upper respiratory specimens during the acute phase of infection. The lowest concentration of SARS-CoV-2 viral copies this assay can detect is 138 copies/mL. A negative result does not preclude SARS-Cov-2 infection and should not be used as the sole basis for treatment or other patient management decisions. A negative result may occur with  improper specimen collection/handling, submission of specimen other than nasopharyngeal swab, presence of viral mutation(s) within the areas targeted by this assay, and inadequate number of viral copies(<138 copies/mL). A negative result must be combined with clinical observations, patient history, and epidemiological information. The expected result is Negative.  Fact Sheet for Patients:  BloggerCourse.com  Fact Sheet for Healthcare Providers:  SeriousBroker.it  This test is no t yet approved or cleared by the [place] FDA and  has been authorized for detection and/or diagnosis of SARS-CoV-2 by FDA under an Emergency Use Authorization (EUA). This EUA will remain  in effect (meaning this test can be used) for the duration of the COVID-19  declaration under Section 564(b)(1) of the Act, 21 U.S.C.section 360bbb-3(b)(1), unless the authorization is terminated  or revoked sooner.       Influenza A by PCR NEGATIVE NEGATIVE Final   Influenza B by PCR NEGATIVE NEGATIVE Final  Comment: (NOTE) The Xpert Xpress SARS-CoV-2/FLU/RSV plus assay is intended as an aid in the diagnosis of influenza from Nasopharyngeal swab specimens and should not be used as a sole basis for treatment. Nasal washings and aspirates are unacceptable for Xpert Xpress SARS-CoV-2/FLU/RSV testing.  Fact Sheet for Patients: BloggerCourse.com  Fact Sheet for Healthcare Providers: SeriousBroker.it  This test is not yet approved or cleared by the [place] FDA and has been authorized for detection and/or diagnosis of SARS-CoV-2 by FDA under an Emergency Use Authorization (EUA). This EUA will remain in effect (meaning this test can be used) for the duration of the COVID-19 declaration under Section 564(b)(1) of the Act, 21 U.S.C. section 360bbb-3(b)(1), unless the authorization is terminated or revoked.     Resp Syncytial Virus by PCR NEGATIVE NEGATIVE Final    Comment: (NOTE) Fact Sheet for Patients: BloggerCourse.com  Fact Sheet for Healthcare Providers: SeriousBroker.it  This test is not yet approved or cleared by the [place] FDA and has been authorized for detection and/or diagnosis of SARS-CoV-2 by FDA under an Emergency Use Authorization (EUA). This EUA will remain in effect (meaning this test can be used) for the duration of the COVID-19 declaration under Section 564(b)(1) of the Act, 21 U.S.C. section 360bbb-3(b)(1), unless the authorization is terminated or revoked.  Performed at [facility], [address], [place], [place] [address]      Labs: BNP (last 3 results) No results for input(s): "BNP" in the last 8760 hours. Basic  Metabolic Panel: Recent Labs  Lab 07/16/22 1742 07/17/22 0055 07/18/22 0516 07/19/22 0508 07/20/22 0509  NA 144 146* 144 143 145  K 3.5 3.7 3.1* 3.7 3.5  CL 113* 115* 114* 111 114*  CO2 23 24 21* 20* 24  GLUCOSE 216* 200* 120* 76 101*  BUN 18 19 20 18 17   CREATININE 1.40* 1.46* 1.40* 1.44* 1.48*  CALCIUM 8.0* 8.3* 7.9* 8.1* 8.0*  MG  --   --  1.6* 2.1 1.9   Liver Function Tests: Recent Labs  Lab 07/16/22 1742 07/18/22 0516  AST 26 17  ALT 17 14  ALKPHOS 81 76  BILITOT 1.1 0.9  PROT 4.5* 4.3*  ALBUMIN <1.5* <1.5*   No results for input(s): "LIPASE", "AMYLASE" in the last 168 hours. No results for input(s): "AMMONIA" in the last 168 hours. CBC: Recent Labs  Lab 07/16/22 1742 07/17/22 0055 07/18/22 0516 07/19/22 0508 07/20/22 0509  WBC 7.1 8.6 7.5 7.6 5.8  NEUTROABS 5.8  --   --   --   --   HGB 10.8* 11.9* 10.3* 11.9* 10.2*  HCT 31.3* 35.4* 29.6* 35.8* 29.7*  MCV 84.6 86.3 83.6 87.3 84.6  PLT 185 180 184 190 183   Cardiac Enzymes: No results for input(s): "CKTOTAL", "CKMB", "CKMBINDEX", "TROPONINI" in the last 168 hours. BNP: Invalid input(s): "POCBNP" CBG: Recent Labs  Lab 07/21/22 0525 07/21/22 1113 07/22/22 0013 07/22/22 0613 07/22/22 1057  GLUCAP 110* 159* 208* 95 117*   D-Dimer No results for input(s): "DDIMER" in the last 72 hours. Hgb A1c No results for input(s): "HGBA1C" in the last 72 hours. Lipid Profile No results for input(s): "CHOL", "HDL", "LDLCALC", "TRIG", "CHOLHDL", "LDLDIRECT" in the last 72 hours. Thyroid function studies No results for input(s): "TSH", "T4TOTAL", "T3FREE", "THYROIDAB" in the last 72 hours.  Invalid input(s): "FREET3" Anemia work up No results for input(s): "VITAMINB12", "FOLATE", "FERRITIN", "TIBC", "IRON", "RETICCTPCT" in the last 72 hours. Urinalysis    Component Value Date/Time   COLORURINE YELLOW 07/16/2022 1640  APPEARANCEUR TURBID (A) 07/16/2022 1640   APPEARANCEUR Hazy (A) 08/26/2021 1027    LABSPEC 1.009 07/16/2022 1640   PHURINE 5.0 07/16/2022 1640   GLUCOSEU NEGATIVE 07/16/2022 1640   HGBUR SMALL (A) 07/16/2022 1640   BILIRUBINUR NEGATIVE 07/16/2022 1640   BILIRUBINUR Negative 08/26/2021 1027   KETONESUR NEGATIVE 07/16/2022 1640   PROTEINUR 30 (A) 07/16/2022 1640   UROBILINOGEN 1.0 10/12/2014 1935   NITRITE NEGATIVE 07/16/2022 1640   LEUKOCYTESUR SMALL (A) 07/16/2022 1640   Sepsis Labs Recent Labs  Lab 07/17/22 0055 07/18/22 0516 07/19/22 0508 07/20/22 0509  WBC 8.6 7.5 7.6 5.8   Microbiology Recent Results (from the past 240 hour(s))  Culture, blood (Routine x 2)     Status: None   Collection Time: 07/16/22  4:15 PM   Specimen: BLOOD  Result Value Ref Range Status   Specimen Description BLOOD BLOOD LEFT WRIST  Final   Special Requests   Final    BOTTLES DRAWN AEROBIC ONLY Blood Culture results may not be optimal due to an inadequate volume of blood received in culture bottles   Culture   Final    NO GROWTH 5 DAYS Performed at [facility], [address], [place], [place] [address]    Report Status 07/21/2022 FINAL  Final  Culture, blood (Routine x 2)     Status: None   Collection Time: 07/16/22  4:27 PM   Specimen: BLOOD  Result Value Ref Range Status   Specimen Description BLOOD BLOOD RIGHT ARM  Final   Special Requests   Final    BOTTLES DRAWN AEROBIC ONLY Blood Culture adequate volume   Culture   Final    NO GROWTH 5 DAYS Performed at [facility], [address], [place], [place] [address]    Report Status 07/21/2022 FINAL  Final  Urine Culture     Status: Abnormal   Collection Time: 07/16/22  4:40 PM   Specimen: Urine, Random  Result Value Ref Range Status   Specimen Description   Final    URINE, RANDOM Performed at [facility], [address] [place], [place] [address]    Special Requests   Final    NONE Reflexed from [id number] Performed at [facility], [address] [place], [place] [address]    Culture (A)  Final    >=100,000  COLONIES/mL ESCHERICHIA COLI >=100,000 COLONIES/mL PSEUDOMONAS AERUGINOSA    Report Status 07/19/2022 FINAL  Final   Organism ID, Bacteria ESCHERICHIA COLI (A)  Final   Organism ID, Bacteria PSEUDOMONAS AERUGINOSA (A)  Final      Susceptibility   Escherichia coli - MIC*    AMPICILLIN 16 INTERMEDIATE Intermediate     CEFAZOLIN <=4 SENSITIVE Sensitive     CEFEPIME <=0.12 SENSITIVE Sensitive     CEFTRIAXONE <=0.25 SENSITIVE Sensitive     CIPROFLOXACIN <=0.25 SENSITIVE Sensitive     GENTAMICIN <=1 SENSITIVE Sensitive     IMIPENEM <=0.25 SENSITIVE Sensitive     NITROFURANTOIN <=16 SENSITIVE Sensitive     TRIMETH/SULFA <=20 SENSITIVE Sensitive     AMPICILLIN/SULBACTAM 4 SENSITIVE Sensitive     PIP/TAZO <=4 SENSITIVE Sensitive     * >=100,000 COLONIES/mL ESCHERICHIA COLI   Pseudomonas aeruginosa - MIC*    CEFTAZIDIME 2 SENSITIVE Sensitive     CIPROFLOXACIN <=0.25 SENSITIVE Sensitive     GENTAMICIN <=1 SENSITIVE Sensitive     IMIPENEM 2 SENSITIVE Sensitive     PIP/TAZO 8 SENSITIVE Sensitive     CEFEPIME 1 SENSITIVE Sensitive     * >=  100,000 COLONIES/mL PSEUDOMONAS AERUGINOSA  Resp panel by RT-PCR (RSV, Flu A&B, Covid) Anterior Nasal Swab     Status: None   Collection Time: 07/16/22  7:53 PM   Specimen: Anterior Nasal Swab  Result Value Ref Range Status   SARS Coronavirus 2 by RT PCR NEGATIVE NEGATIVE Final    Comment: (NOTE) SARS-CoV-2 target nucleic acids are NOT DETECTED.  The SARS-CoV-2 RNA is generally detectable in upper respiratory specimens during the acute phase of infection. The lowest concentration of SARS-CoV-2 viral copies this assay can detect is 138 copies/mL. A negative result does not preclude SARS-Cov-2 infection and should not be used as the sole basis for treatment or other patient management decisions. A negative result may occur with  improper specimen collection/handling, submission of specimen other than nasopharyngeal swab, presence of viral mutation(s)  within the areas targeted by this assay, and inadequate number of viral copies(<138 copies/mL). A negative result must be combined with clinical observations, patient history, and epidemiological information. The expected result is Negative.  Fact Sheet for Patients:  BloggerCourse.com  Fact Sheet for Healthcare Providers:  SeriousBroker.it  This test is no t yet approved or cleared by the [place] FDA and  has been authorized for detection and/or diagnosis of SARS-CoV-2 by FDA under an Emergency Use Authorization (EUA). This EUA will remain  in effect (meaning this test can be used) for the duration of the COVID-19 declaration under Section 564(b)(1) of the Act, 21 U.S.C.section 360bbb-3(b)(1), unless the authorization is terminated  or revoked sooner.       Influenza A by PCR NEGATIVE NEGATIVE Final   Influenza B by PCR NEGATIVE NEGATIVE Final    Comment: (NOTE) The Xpert Xpress SARS-CoV-2/FLU/RSV plus assay is intended as an aid in the diagnosis of influenza from Nasopharyngeal swab specimens and should not be used as a sole basis for treatment. Nasal washings and aspirates are unacceptable for Xpert Xpress SARS-CoV-2/FLU/RSV testing.  Fact Sheet for Patients: BloggerCourse.com  Fact Sheet for Healthcare Providers: SeriousBroker.it  This test is not yet approved or cleared by the [place] FDA and has been authorized for detection and/or diagnosis of SARS-CoV-2 by FDA under an Emergency Use Authorization (EUA). This EUA will remain in effect (meaning this test can be used) for the duration of the COVID-19 declaration under Section 564(b)(1) of the Act, 21 U.S.C. section 360bbb-3(b)(1), unless the authorization is terminated or revoked.     Resp Syncytial Virus by PCR NEGATIVE NEGATIVE Final    Comment: (NOTE) Fact Sheet for  Patients: BloggerCourse.com  Fact Sheet for Healthcare Providers: SeriousBroker.it  This test is not yet approved or cleared by the [place] FDA and has been authorized for detection and/or diagnosis of SARS-CoV-2 by FDA under an Emergency Use Authorization (EUA). This EUA will remain in effect (meaning this test can be used) for the duration of the COVID-19 declaration under Section 564(b)(1) of the Act, 21 U.S.C. section 360bbb-3(b)(1), unless the authorization is terminated or revoked.  Performed at [facility], [address], [place], [place] [address]      Time coordinating discharge: 35 minutes  SIGNED:  Marcelline Mates, MS4   ATTENDING NOTE  Patient seen and examined with Desiree Lucy, Medical student. In addition to supervising the encounter, I played a key role in the decision making process as well as reviewed key findings.  Fortunately she has recovered and responded well to supportive measures in hospital and now she is back to her baseline and eager to go home.  She is stable to DC  home today with close outpatient follow up .  I agree with documentation as noted.    Rodney Langton, MD  How to contact the [facility] Attending or Consulting provider 7A - 7P or covering provider during after hours 7P -7A, for this patient?  Check the care team in [facility] and look for a) attending/consulting TRH provider listed and b) the [facility] team listed Log into [email or web address] and use [place] universal password to access. If you do not have the password, please contact the hospital operator. Locate the [facility] provider you are looking for under [facility] and page to a number that you can be directly reached. If you still have difficulty reaching the provider, please page the [facility] (Director on Call) for the Hospitalists listed on amion for assistance.

## 2022-07-21 NOTE — Plan of Care (Signed)
  Problem: Activity: Goal: Risk for activity intolerance will decrease Outcome: Progressing   Problem: Health Behavior/Discharge Planning: Goal: Ability to manage health-related needs will improve Outcome: Progressing   Problem: Metabolic: Goal: Ability to maintain appropriate glucose levels will improve Outcome: Progressing

## 2022-07-22 LAB — GLUCOSE, CAPILLARY
Glucose-Capillary: 117 mg/dL — ABNORMAL HIGH (ref 70–99)
Glucose-Capillary: 208 mg/dL — ABNORMAL HIGH (ref 70–99)
Glucose-Capillary: 95 mg/dL (ref 70–99)

## 2022-07-22 MED ORDER — ZINC OXIDE 40 % EX OINT: TOPICAL_OINTMENT | Freq: Three times a day (TID) | CUTANEOUS | 1 refills | Status: AC

## 2022-07-22 MED ORDER — ZINC OXIDE 40 % EX OINT
TOPICAL_OINTMENT | Freq: Three times a day (TID) | CUTANEOUS | Status: DC
  Filled 2022-07-22: qty 57

## 2022-07-22 MED ORDER — SUCRALFATE 1 GM/10ML PO SUSP: 1.0000 g | Freq: Three times a day (TID) | ORAL | 0 refills | Status: AC

## 2022-07-22 NOTE — Progress Notes (Signed)
Palliative:  Chart review completed.  [name] is to discharge to home today.  No needs identified.  Plan: Continue to treat the treatable but no CPR or intubation.  Active with [facility] for palliative services.  Considering transitioning to "treat the treatable" hospice care.  No charge Colleen Carmel, NP Palliative Medicine Team  Team phone [phone] Greater than 50% of this time was spent counseling and coordinating care related to the above assessment and plan.

## 2022-07-22 NOTE — Progress Notes (Signed)
Chat and phone call earlier to Laverne to find out when EMS is picking up pt. Daughter says she has not been notified yet.

## 2022-07-22 NOTE — Consult Note (Addendum)
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 Desitin ointment is to be applied in the bilateral inguinal, and to the buttocks, perineal area and medial and posterior thighs. Our standing order skin care order set drives treatment plan for the Stage 2 PI to the sacrum using a NS cleanse, pat dry and covering with xeroform (nonadherent, antimicrobial) gauze topped with dry gauze and secured with silicone foam. Changes for the xeroform are to be daily, the silicone foam may be reused for up to 3 days and changed PRN soiling.  WOC nursing team will not follow, but will remain available to this patient, the nursing and medical teams.  Please re-consult if needed.  Thank you for inviting [place] to participate in this patient's Plan of Care.  Ladona Mow, MSN, RN, CNS, GNP, Leda Min, [employer], [employer] phone:  [phone]

## 2022-07-22 NOTE — Plan of Care (Signed)

## 2022-07-22 NOTE — TOC Transition Note (Signed)
Transition of Care [facility]) - CM/SW Discharge Note   Patient Details  Name: [name] MRN: [id number] Date of Birth: 1940/10/05  Transition of Care (TOC) CM/SW Contact:  Catalina Gravel, LCSW Phone Number: 07/22/2022, 12:24 PM   Clinical Narrative:    During progression determined pt at baseline, rehabilitation not pursued and pt to DC home.  CSW contacted daughter, to discuss, she was at hospital so went to floor to meet.  Daughter agreeable and aware that pt DC today.  No other TOC needs.     Final next level of care: Skilled Nursing Facility Barriers to Discharge: No Barriers Identified   Patient Goals and CMS Choice   Choice offered to / list presented to : Adult Children  Discharge Placement                         Discharge Plan and Services Additional resources added to the After Visit Summary for                                       Social Determinants of Health (SDOH) Interventions SDOH Screenings   Food Insecurity: No Food Insecurity (07/16/2022)  Housing: Low Risk  (07/16/2022)  Transportation Needs: No Transportation Needs (07/16/2022)  Utilities: Not At Risk (07/16/2022)  Alcohol Screen: Low Risk  (02/10/2022)  Depression (PHQ2-9): High Risk (02/10/2022)  Financial Resource Strain: Low Risk  (02/10/2022)  Physical Activity: Inactive (02/10/2022)  Social Connections: Socially Isolated (02/10/2022)  Stress: No Stress Concern Present (02/10/2022)  Tobacco Use: Low Risk  (07/20/2022)     Readmission Risk Interventions    07/20/2022   12:18 PM  Readmission Risk Prevention Plan  Transportation Screening Complete  PCP or Specialist Appt within 5-7 Days Complete  Home Care Screening Complete  Medication Review (RN CM) Complete

## 2022-07-22 NOTE — Discharge Instructions (Signed)
IMPORTANT INFORMATION: PAY CLOSE ATTENTION  ? ?PHYSICIAN DISCHARGE INSTRUCTIONS ? ?Follow with Primary care provider  Hill, Gerald, MD  and other consultants as instructed by your Hospitalist Physician ? ?SEEK MEDICAL CARE OR RETURN TO EMERGENCY ROOM IF SYMPTOMS COME BACK, WORSEN OR NEW PROBLEM DEVELOPS  ? ?Please note: ?You were cared for by a hospitalist during your hospital stay. Every effort will be made to forward records to your primary care provider.  You can request that your primary care provider send for your hospital records if they have not received them.  Once you are discharged, your primary care physician will handle any further medical issues. Please note that NO REFILLS for any discharge medications will be authorized once you are discharged, as it is imperative that you return to your primary care physician (or establish a relationship with a primary care physician if you do not have one) for your post hospital discharge needs so that they can reassess your need for medications and monitor your lab values. ? ?Please get a complete blood count and chemistry panel checked by your Primary MD at your next visit, and again as instructed by your Primary MD. ? ?Get Medicines reviewed and adjusted: ?Please take all your medications with you for your next visit with your Primary MD ? ?Laboratory/radiological data: ?Please request your Primary MD to go over all hospital tests and procedure/radiological results at the follow up, please ask your primary care provider to get all Hospital records sent to his/her office. ? ?In some cases, they will be blood work, cultures and biopsy results pending at the time of your discharge. Please request that your primary care provider follow up on these results. ? ?If you are diabetic, please bring your blood sugar readings with you to your follow up appointment with primary care.   ? ?Please call and make your follow up appointments as soon as possible.   ? ?Also Note the  following: ?If you experience worsening of your admission symptoms, develop shortness of breath, life threatening emergency, suicidal or homicidal thoughts you must seek medical attention immediately by calling 911 or calling your MD immediately  if symptoms less severe. ? ?You must read complete instructions/literature along with all the possible adverse reactions/side effects for all the Medicines you take and that have been prescribed to you. Take any new Medicines after you have completely understood and accpet all the possible adverse reactions/side effects.  ? ?Do not drive when taking Pain medications or sleeping medications (Benzodiazepines) ? ?Do not take more than prescribed Pain, Sleep and Anxiety Medications. It is not advisable to combine anxiety,sleep and pain medications without talking with your primary care practitioner ? ?Special Instructions: If you have smoked or chewed Tobacco  in the last 2 yrs please stop smoking, stop any regular Alcohol  and or any Recreational drug use. ? ?Wear Seat belts while driving.  Do not drive if taking any narcotic, mind altering or controlled substances or recreational drugs or alcohol.  ? ? ? ? ? ?

## 2022-07-22 NOTE — Care Management Important Message (Signed)
Important Message  Patient Details  Name: [name] MRN: [id number] Date of Birth: 06-19-1940   Medicare Important Message Given:  Yes     Corey Harold 07/22/2022, 11:52 AM

## 2022-07-27 NOTE — Telephone Encounter (Signed)
Would hold off on scheduling EGD for now until seen in office

## 2022-07-28 NOTE — Telephone Encounter (Signed)
Noted. Has upcoming OV 8/7

## 2022-07-30 ENCOUNTER — Telehealth: Payer: Self-pay | Admitting: Internal Medicine

## 2022-07-30 NOTE — Telephone Encounter (Signed)
See prior phone notes in chart. She needs to be seen In office 1st. Thanks!

## 2022-07-30 NOTE — Telephone Encounter (Signed)
There is a recall for this patient and a note to send to you to repeat EGD in 12 weeks to verify ulcer healing.

## 2022-08-12 ENCOUNTER — Ambulatory Visit: Payer: 59 | Admitting: Internal Medicine

## 2022-08-27 ENCOUNTER — Ambulatory Visit: Payer: 59 | Admitting: Internal Medicine

## 2022-09-02 ENCOUNTER — Ambulatory Visit: Payer: 59 | Admitting: "Endocrinology

## 2022-10-06 DEATH — deceased

## 2023-01-30 IMAGING — RF DG SWALLOWING FUNCTION
13 of 14 series · 13 of 24 positions shown · non-contrast
Comparison: None

CLINICAL DATA: Dysphagia, laryngeal penetration on prior esophagram

EXAM:
MODIFIED BARIUM SWALLOW
TECHNIQUE: Different consistencies of barium were administered orally to the
patient by the Speech Pathologist. Imaging of the pharynx was
performed in the lateral projection. The radiologist was present in
the fluoroscopy room for this study, providing personal supervision.
FLUOROSCOPY TIME:  Fluoroscopy Time:  2 minutes 24 seconds
Radiation Exposure Index (if provided by the fluoroscopic device):
22.0
Number of Acquired Spot Images: multiple fluoroscopic screen
captures

[Series 1: before po · 1 of 35 frames shown]
[frame 6/35]
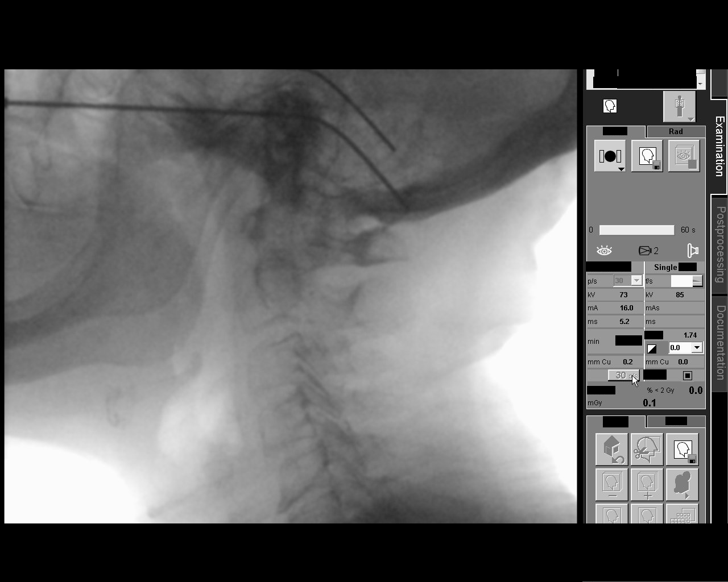

[Series 2: tsp thin · 1 of 237 frames shown (1 of 3)]
[frame 188/237]
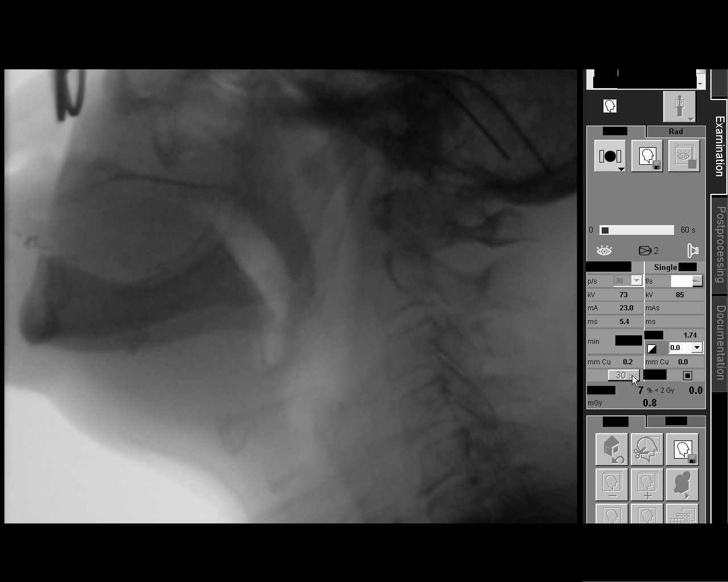

[Series 3: tsp thin · 1 of 115 frames shown (2 of 3)]
[frame 78/115]
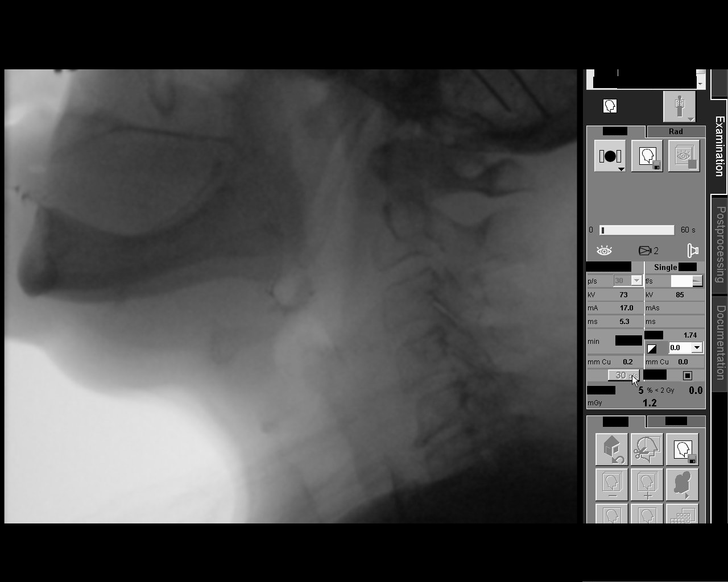

[Series 4: tsp thin · 1 of 136 frames shown (3 of 3)]
[frame 116/136]
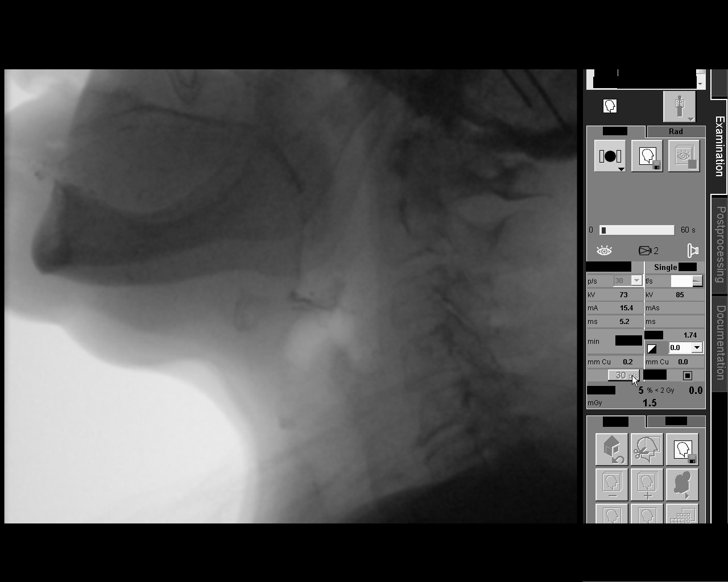

[Series 6: cup thin · 1 of 118 frames shown]
[frame 18/118]
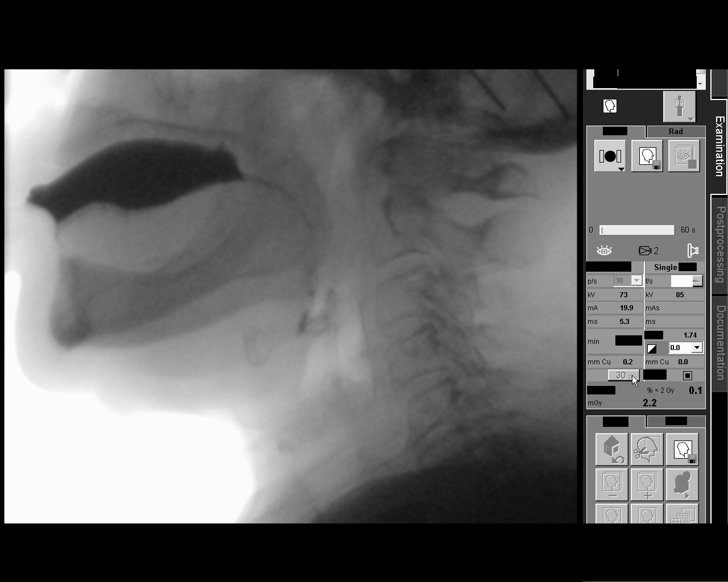

[Series 7: straw thin · 1 of 152 frames shown]
[frame 23/152]
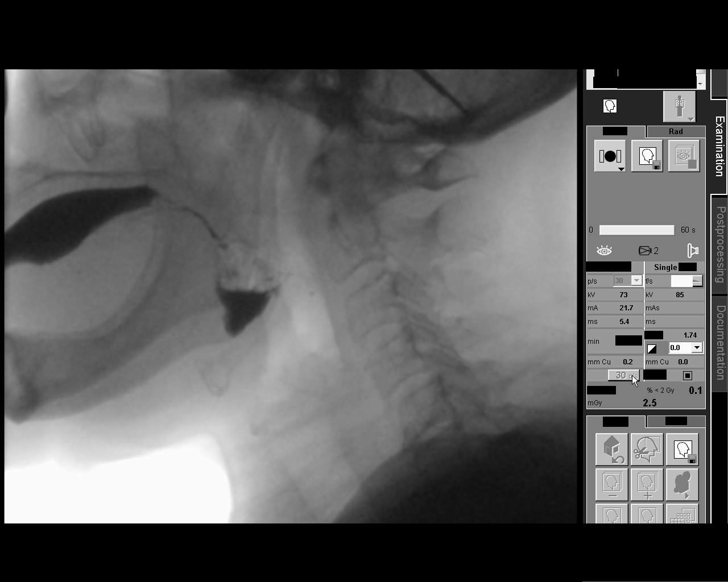

[Series 8: rstraw thin · 1 of 219 frames shown]
[frame 108/219]
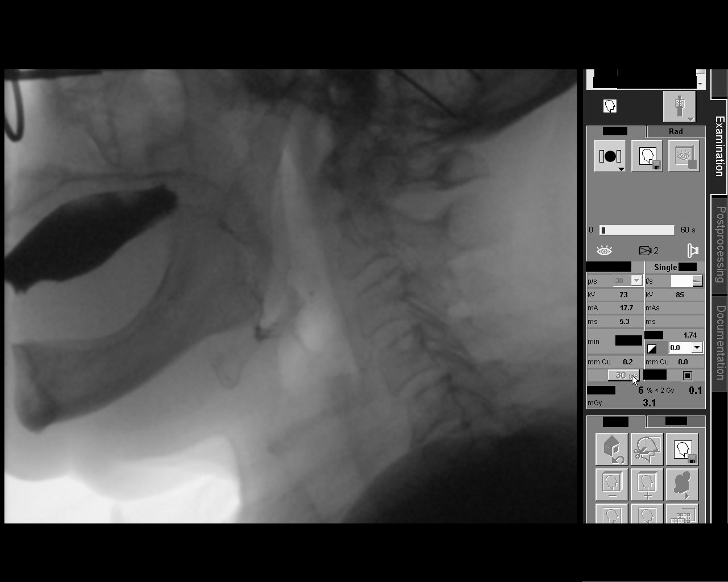

[Series 9: repeat swallow · 1 of 232 frames shown]
[frame 35/232]
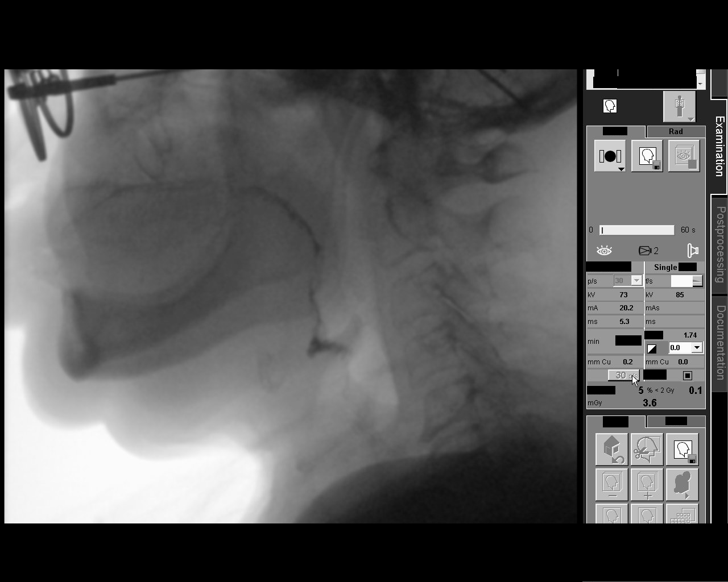

[Series 10: puree · 1 of 675 frames shown (1 of 2)]
[frame 102/675]
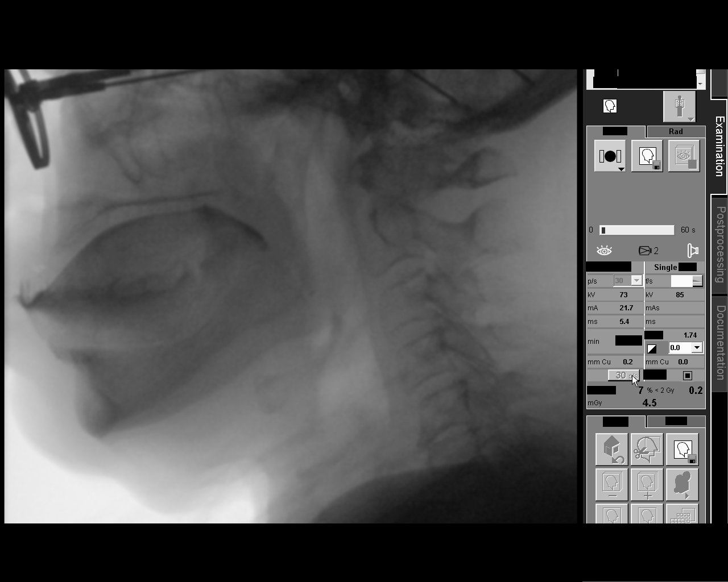

[Series 11: puree · 1 of 674 frames shown (2 of 2)]
[frame 338/674]
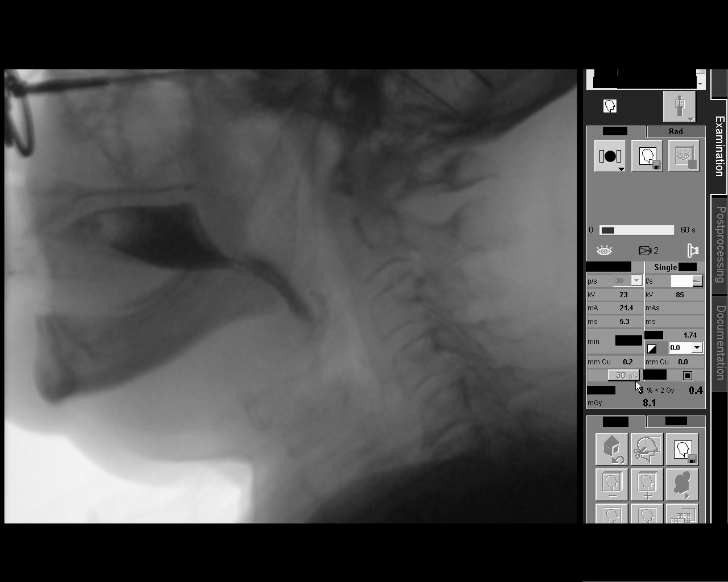

[Series 12: regular · 1 of 777 frames shown]
[frame 389/777]
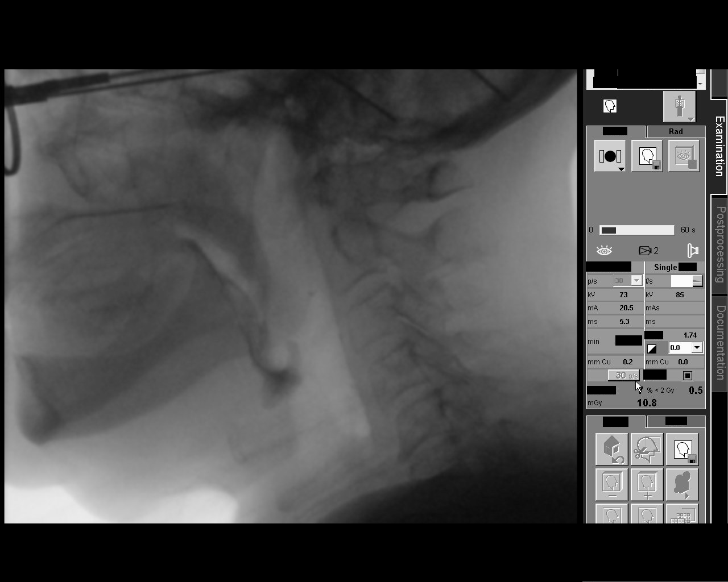

[Series 13: tablet thin · 1 of 393 frames shown]
[frame 335/393]
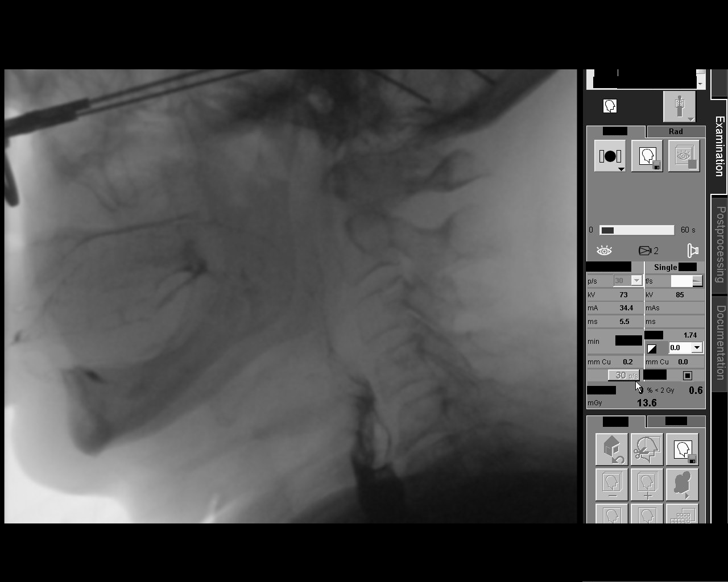

[Series 14: sweep · 1 of 396 frames shown]
[frame 337/396]
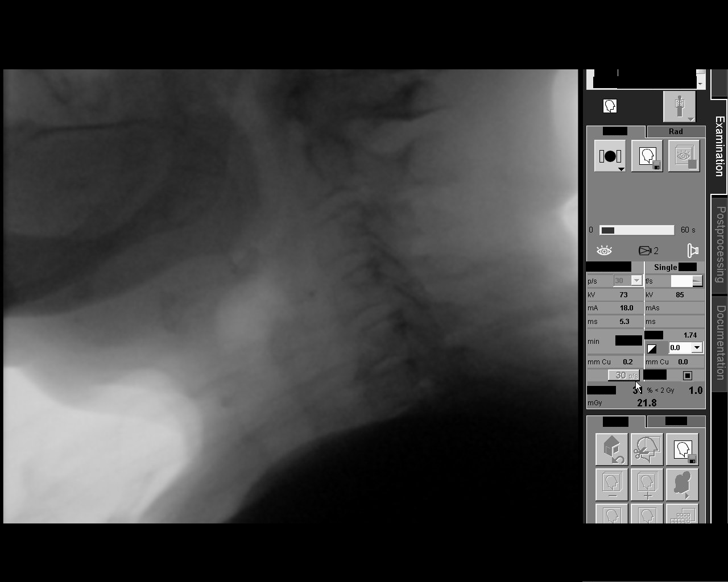

[13 of 24 positions shown; findings below may reference images not displayed]

FINDINGS: Flash laryngeal penetration of thin barium by teaspoon and cup.

Premature spillover to of contrast to the vallecular and piriform
sinus was intermittently visualized.

No aspiration or residuals.

Purees and cracker consistencies unremarkable.

Patient swallowed a 12.5 mm diameter barium tablet without
difficulty, pill passing to the stomach.
IMPRESSION: Flash laryngeal penetration without aspiration.

Please refer to the Speech Pathologists report for complete details
and recommendations.
